# Patient Record
Sex: Male | Born: 1955 | Race: White | Hispanic: No | Marital: Single | Smoking: Former smoker
Health system: Southern US, Community
[De-identification: ages and names within clinical notes are randomized; demographics above are authoritative.]

## PROBLEM LIST (undated history)

## (undated) DIAGNOSIS — I209 Angina pectoris, unspecified: Secondary | ICD-10-CM

## (undated) DIAGNOSIS — E11621 Type 2 diabetes mellitus with foot ulcer: Secondary | ICD-10-CM

## (undated) DIAGNOSIS — I214 Non-ST elevation (NSTEMI) myocardial infarction: Secondary | ICD-10-CM

## (undated) DIAGNOSIS — G9340 Encephalopathy, unspecified: Secondary | ICD-10-CM

## (undated) DIAGNOSIS — F419 Anxiety disorder, unspecified: Secondary | ICD-10-CM

## (undated) DIAGNOSIS — I4891 Unspecified atrial fibrillation: Secondary | ICD-10-CM

## (undated) DIAGNOSIS — R51 Headache: Secondary | ICD-10-CM

## (undated) DIAGNOSIS — F329 Major depressive disorder, single episode, unspecified: Secondary | ICD-10-CM

## (undated) DIAGNOSIS — L97509 Non-pressure chronic ulcer of other part of unspecified foot with unspecified severity: Secondary | ICD-10-CM

## (undated) DIAGNOSIS — I4892 Unspecified atrial flutter: Secondary | ICD-10-CM

## (undated) DIAGNOSIS — I739 Peripheral vascular disease, unspecified: Secondary | ICD-10-CM

## (undated) DIAGNOSIS — I219 Acute myocardial infarction, unspecified: Secondary | ICD-10-CM

## (undated) DIAGNOSIS — I10 Essential (primary) hypertension: Secondary | ICD-10-CM

## (undated) DIAGNOSIS — F0390 Unspecified dementia without behavioral disturbance: Secondary | ICD-10-CM

## (undated) DIAGNOSIS — E669 Obesity, unspecified: Secondary | ICD-10-CM

## (undated) DIAGNOSIS — I5022 Chronic systolic (congestive) heart failure: Secondary | ICD-10-CM

## (undated) DIAGNOSIS — L0390 Cellulitis, unspecified: Secondary | ICD-10-CM

## (undated) DIAGNOSIS — M6282 Rhabdomyolysis: Secondary | ICD-10-CM

## (undated) DIAGNOSIS — S7290XA Unspecified fracture of unspecified femur, initial encounter for closed fracture: Secondary | ICD-10-CM

## (undated) DIAGNOSIS — I509 Heart failure, unspecified: Secondary | ICD-10-CM

## (undated) DIAGNOSIS — I25810 Atherosclerosis of coronary artery bypass graft(s) without angina pectoris: Secondary | ICD-10-CM

## (undated) DIAGNOSIS — J439 Emphysema, unspecified: Secondary | ICD-10-CM

## (undated) HISTORY — PX: CORONARY ANGIOPLASTY: SHX604

## (undated) HISTORY — DX: Unspecified fracture of unspecified femur, initial encounter for closed fracture: S72.90XA

## (undated) HISTORY — PX: CORONARY ARTERY BYPASS GRAFT: SHX141

## (undated) HISTORY — DX: Emphysema, unspecified: J43.9

## (undated) HISTORY — DX: Acute myocardial infarction, unspecified: I21.9

## (undated) HISTORY — PX: VASCULAR SURGERY: SHX849

## (undated) HISTORY — PX: NO PAST SURGERIES: SHX2092

## (undated) HISTORY — DX: Chronic systolic (congestive) heart failure: I50.22

## (undated) HISTORY — DX: Unspecified atrial flutter: I48.92

## (undated) HISTORY — DX: Obesity, unspecified: E66.9

## (undated) HISTORY — PX: OTHER SURGICAL HISTORY: SHX169

---

## 2003-09-23 ENCOUNTER — Other Ambulatory Visit: Payer: Self-pay

## 2005-06-04 ENCOUNTER — Emergency Department: Payer: Self-pay | Admitting: Emergency Medicine

## 2005-10-12 ENCOUNTER — Emergency Department: Payer: Self-pay | Admitting: Emergency Medicine

## 2005-10-22 ENCOUNTER — Emergency Department: Payer: Self-pay | Admitting: Emergency Medicine

## 2005-11-27 ENCOUNTER — Emergency Department: Payer: Self-pay | Admitting: Emergency Medicine

## 2005-11-28 ENCOUNTER — Ambulatory Visit: Payer: Self-pay | Admitting: Emergency Medicine

## 2005-12-29 ENCOUNTER — Emergency Department: Payer: Self-pay | Admitting: Emergency Medicine

## 2006-01-31 ENCOUNTER — Emergency Department: Payer: Self-pay | Admitting: Emergency Medicine

## 2006-02-23 ENCOUNTER — Emergency Department: Payer: Self-pay | Admitting: Unknown Physician Specialty

## 2006-04-17 ENCOUNTER — Inpatient Hospital Stay: Payer: Self-pay | Admitting: Internal Medicine

## 2006-05-12 ENCOUNTER — Inpatient Hospital Stay: Payer: Self-pay | Admitting: Internal Medicine

## 2006-08-19 ENCOUNTER — Emergency Department: Payer: Self-pay | Admitting: Emergency Medicine

## 2006-08-30 ENCOUNTER — Emergency Department: Payer: Self-pay | Admitting: Emergency Medicine

## 2006-09-13 HISTORY — PX: CARDIAC CATHETERIZATION: SHX172

## 2006-09-18 ENCOUNTER — Inpatient Hospital Stay: Payer: Self-pay | Admitting: Internal Medicine

## 2006-09-18 ENCOUNTER — Other Ambulatory Visit: Payer: Self-pay

## 2006-09-19 ENCOUNTER — Other Ambulatory Visit: Payer: Self-pay

## 2006-09-20 ENCOUNTER — Other Ambulatory Visit: Payer: Self-pay

## 2006-09-25 ENCOUNTER — Emergency Department: Payer: Self-pay | Admitting: Unknown Physician Specialty

## 2006-09-25 ENCOUNTER — Other Ambulatory Visit: Payer: Self-pay

## 2006-10-20 ENCOUNTER — Emergency Department: Payer: Self-pay | Admitting: Emergency Medicine

## 2006-12-24 ENCOUNTER — Emergency Department: Payer: Self-pay | Admitting: Emergency Medicine

## 2007-01-10 ENCOUNTER — Inpatient Hospital Stay: Payer: Self-pay | Admitting: Internal Medicine

## 2007-01-10 ENCOUNTER — Other Ambulatory Visit: Payer: Self-pay

## 2007-02-17 ENCOUNTER — Inpatient Hospital Stay: Payer: Self-pay | Admitting: Internal Medicine

## 2007-02-17 ENCOUNTER — Other Ambulatory Visit: Payer: Self-pay

## 2007-07-05 ENCOUNTER — Emergency Department: Payer: Self-pay | Admitting: Emergency Medicine

## 2007-09-23 ENCOUNTER — Other Ambulatory Visit: Payer: Self-pay

## 2007-09-24 ENCOUNTER — Observation Stay: Payer: Self-pay | Admitting: Internal Medicine

## 2007-12-21 ENCOUNTER — Other Ambulatory Visit: Payer: Self-pay

## 2007-12-21 ENCOUNTER — Observation Stay: Payer: Self-pay | Admitting: Internal Medicine

## 2009-02-20 ENCOUNTER — Emergency Department: Payer: Self-pay | Admitting: Emergency Medicine

## 2011-05-26 ENCOUNTER — Emergency Department (HOSPITAL_COMMUNITY)
Admission: EM | Admit: 2011-05-26 | Discharge: 2011-05-26 | Disposition: A | Discharge status: Home / Self Care | Payer: Self-pay | Attending: Emergency Medicine | Admitting: Emergency Medicine

## 2011-05-26 ENCOUNTER — Emergency Department (HOSPITAL_COMMUNITY): Payer: Self-pay

## 2011-05-26 DIAGNOSIS — I252 Old myocardial infarction: Secondary | ICD-10-CM | POA: Insufficient documentation

## 2011-05-26 DIAGNOSIS — Z79899 Other long term (current) drug therapy: Secondary | ICD-10-CM | POA: Insufficient documentation

## 2011-05-26 DIAGNOSIS — S0990XA Unspecified injury of head, initial encounter: Secondary | ICD-10-CM | POA: Insufficient documentation

## 2011-05-26 DIAGNOSIS — M542 Cervicalgia: Secondary | ICD-10-CM | POA: Insufficient documentation

## 2011-05-26 DIAGNOSIS — Y92009 Unspecified place in unspecified non-institutional (private) residence as the place of occurrence of the external cause: Secondary | ICD-10-CM | POA: Insufficient documentation

## 2011-05-26 DIAGNOSIS — S0003XA Contusion of scalp, initial encounter: Secondary | ICD-10-CM | POA: Insufficient documentation

## 2011-05-26 DIAGNOSIS — E119 Type 2 diabetes mellitus without complications: Secondary | ICD-10-CM | POA: Insufficient documentation

## 2011-05-26 DIAGNOSIS — W010XXA Fall on same level from slipping, tripping and stumbling without subsequent striking against object, initial encounter: Secondary | ICD-10-CM | POA: Insufficient documentation

## 2011-05-26 DIAGNOSIS — R51 Headache: Secondary | ICD-10-CM | POA: Insufficient documentation

## 2011-05-26 DIAGNOSIS — S1093XA Contusion of unspecified part of neck, initial encounter: Secondary | ICD-10-CM | POA: Insufficient documentation

## 2011-10-14 ENCOUNTER — Inpatient Hospital Stay (HOSPITAL_COMMUNITY)
Admission: EM | Admit: 2011-10-14 | Discharge: 2011-11-10 | DRG: 239 | Disposition: A | Discharge status: Skilled Nursing Facility | Payer: MEDICAID | Attending: Internal Medicine | Admitting: Internal Medicine

## 2011-10-14 ENCOUNTER — Emergency Department (HOSPITAL_COMMUNITY): Payer: Self-pay

## 2011-10-14 ENCOUNTER — Encounter: Payer: Self-pay | Admitting: *Deleted

## 2011-10-14 DIAGNOSIS — Z89519 Acquired absence of unspecified leg below knee: Secondary | ICD-10-CM

## 2011-10-14 DIAGNOSIS — Z951 Presence of aortocoronary bypass graft: Secondary | ICD-10-CM

## 2011-10-14 DIAGNOSIS — E1159 Type 2 diabetes mellitus with other circulatory complications: Principal | ICD-10-CM | POA: Diagnosis present

## 2011-10-14 DIAGNOSIS — I4892 Unspecified atrial flutter: Secondary | ICD-10-CM

## 2011-10-14 DIAGNOSIS — E119 Type 2 diabetes mellitus without complications: Secondary | ICD-10-CM | POA: Diagnosis present

## 2011-10-14 DIAGNOSIS — I96 Gangrene, not elsewhere classified: Secondary | ICD-10-CM | POA: Diagnosis present

## 2011-10-14 DIAGNOSIS — E11621 Type 2 diabetes mellitus with foot ulcer: Secondary | ICD-10-CM

## 2011-10-14 DIAGNOSIS — M359 Systemic involvement of connective tissue, unspecified: Secondary | ICD-10-CM | POA: Diagnosis present

## 2011-10-14 DIAGNOSIS — T368X5A Adverse effect of other systemic antibiotics, initial encounter: Secondary | ICD-10-CM | POA: Diagnosis not present

## 2011-10-14 DIAGNOSIS — E1149 Type 2 diabetes mellitus with other diabetic neurological complication: Secondary | ICD-10-CM | POA: Diagnosis present

## 2011-10-14 DIAGNOSIS — B952 Enterococcus as the cause of diseases classified elsewhere: Secondary | ICD-10-CM | POA: Diagnosis present

## 2011-10-14 DIAGNOSIS — Z794 Long term (current) use of insulin: Secondary | ICD-10-CM

## 2011-10-14 DIAGNOSIS — L03039 Cellulitis of unspecified toe: Secondary | ICD-10-CM | POA: Diagnosis present

## 2011-10-14 DIAGNOSIS — M9080 Osteopathy in diseases classified elsewhere, unspecified site: Secondary | ICD-10-CM | POA: Diagnosis present

## 2011-10-14 DIAGNOSIS — L089 Local infection of the skin and subcutaneous tissue, unspecified: Secondary | ICD-10-CM

## 2011-10-14 DIAGNOSIS — I2510 Atherosclerotic heart disease of native coronary artery without angina pectoris: Secondary | ICD-10-CM | POA: Diagnosis present

## 2011-10-14 DIAGNOSIS — B965 Pseudomonas (aeruginosa) (mallei) (pseudomallei) as the cause of diseases classified elsewhere: Secondary | ICD-10-CM | POA: Diagnosis present

## 2011-10-14 DIAGNOSIS — I10 Essential (primary) hypertension: Secondary | ICD-10-CM

## 2011-10-14 DIAGNOSIS — M869 Osteomyelitis, unspecified: Secondary | ICD-10-CM | POA: Diagnosis present

## 2011-10-14 DIAGNOSIS — D696 Thrombocytopenia, unspecified: Secondary | ICD-10-CM | POA: Diagnosis not present

## 2011-10-14 DIAGNOSIS — I214 Non-ST elevation (NSTEMI) myocardial infarction: Secondary | ICD-10-CM

## 2011-10-14 DIAGNOSIS — L02619 Cutaneous abscess of unspecified foot: Secondary | ICD-10-CM | POA: Diagnosis present

## 2011-10-14 DIAGNOSIS — I25810 Atherosclerosis of coronary artery bypass graft(s) without angina pectoris: Secondary | ICD-10-CM

## 2011-10-14 DIAGNOSIS — B961 Klebsiella pneumoniae [K. pneumoniae] as the cause of diseases classified elsewhere: Secondary | ICD-10-CM | POA: Diagnosis present

## 2011-10-14 DIAGNOSIS — L97509 Non-pressure chronic ulcer of other part of unspecified foot with unspecified severity: Secondary | ICD-10-CM

## 2011-10-14 DIAGNOSIS — L03119 Cellulitis of unspecified part of limb: Secondary | ICD-10-CM

## 2011-10-14 HISTORY — DX: Atherosclerosis of coronary artery bypass graft(s) without angina pectoris: I25.810

## 2011-10-14 HISTORY — DX: Essential (primary) hypertension: I10

## 2011-10-14 HISTORY — DX: Type 2 diabetes mellitus with foot ulcer: E11.621

## 2011-10-14 HISTORY — DX: Headache: R51

## 2011-10-14 HISTORY — DX: Type 2 diabetes mellitus with foot ulcer: L97.509

## 2011-10-14 HISTORY — DX: Non-ST elevation (NSTEMI) myocardial infarction: I21.4

## 2011-10-14 HISTORY — DX: Anxiety disorder, unspecified: F41.9

## 2011-10-14 HISTORY — DX: Non-pressure chronic ulcer of other part of unspecified foot with unspecified severity: L97.509

## 2011-10-14 LAB — POCT I-STAT, CHEM 8
Chloride: 94 mEq/L — ABNORMAL LOW (ref 96–112)
Creatinine, Ser: 1.5 mg/dL — ABNORMAL HIGH (ref 0.50–1.35)
Glucose, Bld: 511 mg/dL — ABNORMAL HIGH (ref 70–99)
Potassium: 5 mEq/L (ref 3.5–5.1)

## 2011-10-14 LAB — CBC
Hemoglobin: 14.4 g/dL (ref 13.0–17.0)
RBC: 5.1 MIL/uL (ref 4.22–5.81)

## 2011-10-14 LAB — DIFFERENTIAL
Lymphs Abs: 0.7 10*3/uL (ref 0.7–4.0)
Monocytes Relative: 12 % (ref 3–12)
Neutro Abs: 8.4 10*3/uL — ABNORMAL HIGH (ref 1.7–7.7)
Neutrophils Relative %: 81 % — ABNORMAL HIGH (ref 43–77)

## 2011-10-14 MED ORDER — HYDROMORPHONE HCL PF 1 MG/ML IJ SOLN
0.5000 mg | INTRAMUSCULAR | Status: DC | PRN
  Administered 2011-10-15 (×2): 0.5 mg via INTRAVENOUS
  Filled 2011-10-14 (×2): qty 1

## 2011-10-14 MED ORDER — SODIUM CHLORIDE 0.9 % IV SOLN
Freq: Once | INTRAVENOUS | Status: AC
  Administered 2011-10-15: via INTRAVENOUS

## 2011-10-14 MED ORDER — VANCOMYCIN HCL IN DEXTROSE 1-5 GM/200ML-% IV SOLN
1000.0000 mg | Freq: Once | INTRAVENOUS | Status: AC
  Administered 2011-10-15: 1000 mg via INTRAVENOUS
  Filled 2011-10-14: qty 200

## 2011-10-14 MED ORDER — ONDANSETRON HCL 4 MG/2ML IJ SOLN
4.0000 mg | Freq: Once | INTRAMUSCULAR | Status: AC
  Administered 2011-10-15: 4 mg via INTRAVENOUS
  Filled 2011-10-14: qty 2

## 2011-10-14 MED ORDER — INSULIN ASPART 100 UNIT/ML ~~LOC~~ SOLN
10.0000 [IU] | Freq: Once | SUBCUTANEOUS | Status: AC
  Administered 2011-10-15: 10 [IU] via SUBCUTANEOUS
  Filled 2011-10-14: qty 10

## 2011-10-14 NOTE — ED Notes (Signed)
PT reports he was seen at his PCP but unable to seen MD.Foot is now worse . Pt reports PO controlled DM

## 2011-10-14 NOTE — ED Provider Notes (Addendum)
History     CSN: [id number] Arrival date & time: 10/14/2011  7:58 PM   First MD Initiated Contact with Patient 10/14/11 2222      Chief Complaint  Patient presents with  . Foot Pain    Pt reports infection in rt foot. foot bright red with red blood blisters    (Consider location/radiation/quality/duration/timing/severity/associated sxs/prior treatment) HPI Comments:  [name] is a non-insulin-dependent diabetic.  His reports his blood sugars have been normal he also states, that he's had chronic ulcers at the base of the great toe bilaterally for "a while."  Recently her right foot has become swollen, red, painful, and the ulcer at the base of the toe draining.  He reports he has been unable to see his primary care physician.  Elevation helps the pain minimally, again, or having the leg dependent, increases the pain.  Denies fever, nausea, vomiting, diarrhea, myalgias  Patient is a 55 y.o. male presenting with lower extremity pain. The history is provided by the patient.  Foot Pain This is a chronic problem. The current episode started more than 1 month ago. The problem occurs constantly. The problem has been gradually worsening. Pertinent negatives include no chest pain, coughing, fever, myalgias, nausea, vomiting or weakness. The symptoms are aggravated by exertion. He has tried nothing for the symptoms. The treatment provided no relief.  Foot Pain This is a chronic problem. The current episode started more than 1 month ago. The problem occurs constantly. The problem has been gradually worsening. Pertinent negatives include no chest pain and no shortness of breath. The symptoms are aggravated by exertion. He has tried nothing for the symptoms. The treatment provided no relief.    Past Medical History  Diagnosis Date  . Diabetes mellitus     No past surgical history on file.  No family history on file.  History  Substance Use Topics  . Smoking status: Never Smoker   . Smokeless  tobacco: Not on file  . Alcohol Use: No      Review of Systems  Constitutional: Negative for fever and activity change.  HENT: Negative.   Eyes: Negative.   Respiratory: Negative for cough and shortness of breath.   Cardiovascular: Negative for chest pain.  Gastrointestinal: Negative for nausea, vomiting and diarrhea.  Genitourinary: Negative for dysuria and frequency.  Musculoskeletal: Negative for myalgias and back pain.  Skin: Positive for pallor.  Neurological: Negative for dizziness and weakness.  Hematological: Negative.   Psychiatric/Behavioral: Negative.     Allergies  Review of patient's allergies indicates no known allergies.  Home Medications   Current Outpatient Rx  Name Route Sig Dispense Refill  . ALPRAZOLAM 1 MG PO TABS Oral Take 1 mg by mouth 3 (three) times daily as needed. anxiety       BP 139/84  Pulse 58  Temp(Src) 98.7 F (37.1 C) (Oral)  Resp 16  SpO2 100%  Physical Exam  Constitutional: He is oriented to person, place, and time. He appears well-developed and well-nourished.  HENT:  Head: Normocephalic.  Eyes: EOM are normal.  Cardiovascular: Normal rate and regular rhythm.   Pulmonary/Chest: Effort normal.  Abdominal: Soft.  Musculoskeletal: Normal range of motion.       Feet:  Neurological: He is oriented to person, place, and time.  Psychiatric: He has a normal mood and affect.    ED Course  Procedures (including critical care time)  Labs Reviewed  CBC - Abnormal; Notable for the following:    Platelets 123 (*)  All other components within normal limits  DIFFERENTIAL - Abnormal; Notable for the following:    Neutrophils Relative 81 (*)    Neutro Abs 8.4 (*)    Lymphocytes Relative 6 (*)    Monocytes Absolute 1.3 (*)    All other components within normal limits  POCT I-STAT, CHEM 8 - Abnormal; Notable for the following:    Sodium 130 (*)    Chloride 94 (*)    BUN 28 (*)    Creatinine, Ser 1.50 (*)    Glucose, Bld 511 (*)     Calcium, Ion 1.11 (*)    All other components within normal limits  GLUCOSE, CAPILLARY - Abnormal; Notable for the following:    Glucose-Capillary 460 (*)    All other components within normal limits  WOUND CULTURE  I-STAT, CHEM 8  POCT CBG MONITORING   Dg Foot 2 Views Left  10/15/2011  *RADIOLOGY REPORT*  Clinical Data: Penetrating wounds to the ball of the left foot, with worsening erythema and blistering.  LEFT FOOT - 2 VIEW  Comparison: None.  Findings: There is no evidence of fracture or dislocation.  No osseous erosions are seen to suggest osteomyelitis.  The joint spaces are preserved.  There is no evidence of talar subluxation; the subtalar joint is unremarkable in appearance.  Soft tissue swelling and defect are noted along the plantar aspect of the forefoot.  IMPRESSION:  1.  No evidence of fracture or dislocation.  No definite evidence for osteomyelitis, though if there is significant clinical concern for osteomyelitis, MRI could be considered for further evaluation. 2.  Soft tissue defect and swelling along the plantar aspect of the forefoot.  Original Report Authenticated By: Tonia Ghent, M.D.   Dg Foot 2 Views Right  10/15/2011  *RADIOLOGY REPORT*  Clinical Data: Penetrating wounds to the ball of the right foot from blisters, with diffuse erythema and pain.  RIGHT FOOT - 2 VIEW  Comparison: None.  Findings: There is no evidence of fracture or dislocation.  No osseous erosions are seen to suggest osteomyelitis.  The joint spaces are preserved.  There is no evidence of talar subluxation; the subtalar joint is unremarkable in appearance.  Significant diffuse soft tissue air is noted tracking about the first toe and dorsal to the forefoot, concerning for necrotizing fasciitis.  Underlying soft tissue defects are noted along the plantar aspect of the forefoot.  IMPRESSION:  1.  No evidence of fracture or dislocation.  No definite evidence for osteomyelitis, though if there is significant  clinical concern for osteomyelitis, MRI could be considered for further evaluation. 2.  Significant diffuse soft tissue air about the first toe and dorsum of the forefoot, concerning for necrotizing fasciitis.  Findings were discussed with Earley Favor at 01:03 a.m. on 10/14/2011.  Original Report Authenticated By: Tonia Ghent, M.D.     1. Cellulitis and abscess of foot       MDM  Cellulitis with deep ulcer to base of fifth great toe sole side of foot with erythema on dorsum of foot, extending to ankle with red streaking to mid shin.  Will obtain CBC, electrolytes, x-ray of the foot.  Start IV antibiotics, culture the wound and have this patient admitted   Medical screening examination/treatment/procedure(s) were conducted as a shared visit with non-physician practitioner(s) and myself.  I personally evaluated the patient during the encounter     Arman Filter, NP 10/15/11 1610  Doug Sou, MD 10/15/11 9604  Doug Sou, MD 10/15/11 [phone]

## 2011-10-15 ENCOUNTER — Encounter (HOSPITAL_COMMUNITY): Payer: Self-pay | Admitting: Internal Medicine

## 2011-10-15 ENCOUNTER — Encounter (HOSPITAL_COMMUNITY)
Admission: EM | Disposition: A | Discharge status: Skilled Nursing Facility | Payer: Self-pay | Source: Home / Self Care | Attending: Internal Medicine

## 2011-10-15 ENCOUNTER — Inpatient Hospital Stay (HOSPITAL_COMMUNITY): Payer: Self-pay

## 2011-10-15 ENCOUNTER — Other Ambulatory Visit: Payer: Self-pay

## 2011-10-15 DIAGNOSIS — L089 Local infection of the skin and subcutaneous tissue, unspecified: Secondary | ICD-10-CM | POA: Diagnosis present

## 2011-10-15 DIAGNOSIS — E119 Type 2 diabetes mellitus without complications: Secondary | ICD-10-CM | POA: Diagnosis present

## 2011-10-15 DIAGNOSIS — M79609 Pain in unspecified limb: Secondary | ICD-10-CM

## 2011-10-15 DIAGNOSIS — I25810 Atherosclerosis of coronary artery bypass graft(s) without angina pectoris: Secondary | ICD-10-CM | POA: Diagnosis present

## 2011-10-15 DIAGNOSIS — I4892 Unspecified atrial flutter: Secondary | ICD-10-CM

## 2011-10-15 DIAGNOSIS — I10 Essential (primary) hypertension: Secondary | ICD-10-CM | POA: Diagnosis present

## 2011-10-15 HISTORY — DX: Unspecified atrial flutter: I48.92

## 2011-10-15 LAB — LIPID PANEL
Cholesterol: 150 mg/dL (ref 0–200)
HDL: 13 mg/dL — ABNORMAL LOW (ref >39)
Total CHOL/HDL Ratio: 11.5 RATIO
VLDL: 37 mg/dL (ref 0–40)

## 2011-10-15 LAB — COMPREHENSIVE METABOLIC PANEL
ALT: 18 U/L (ref 0–53)
Albumin: 2.2 g/dL — ABNORMAL LOW (ref 3.5–5.2)
Calcium: 8.5 mg/dL (ref 8.4–10.5)
GFR calc Af Amer: >90 mL/min (ref >90)
Glucose, Bld: 358 mg/dL — ABNORMAL HIGH (ref 70–99)
Sodium: 130 mEq/L — ABNORMAL LOW (ref 135–145)
Total Protein: 6.7 g/dL (ref 6.0–8.3)

## 2011-10-15 LAB — GLUCOSE, CAPILLARY
Glucose-Capillary: 343 mg/dL — ABNORMAL HIGH (ref 70–99)
Glucose-Capillary: 482 mg/dL — ABNORMAL HIGH (ref 70–99)

## 2011-10-15 LAB — CARDIAC PANEL(CRET KIN+CKTOT+MB+TROPI)
CK, MB: 8 ng/mL (ref 0.3–4.0)
CK, MB: 6.2 ng/mL (ref 0.3–4.0)
CK, MB: 5.2 ng/mL — ABNORMAL HIGH (ref 0.3–4.0)
Relative Index: 5.4 — ABNORMAL HIGH (ref 0.0–2.5)
Total CK: 115 U/L (ref 7–232)
Total CK: 109 U/L (ref 7–232)

## 2011-10-15 LAB — D-DIMER, QUANTITATIVE: D-Dimer, Quant: 2.33 ug/mL-FEU — ABNORMAL HIGH (ref 0.00–0.48)

## 2011-10-15 LAB — HEMOGLOBIN A1C: Mean Plasma Glucose: 332 mg/dL — ABNORMAL HIGH (ref <117)

## 2011-10-15 SURGERY — IRRIGATION AND DEBRIDEMENT EXTREMITY
Anesthesia: General | Laterality: Right

## 2011-10-15 MED ORDER — INSULIN ASPART 100 UNIT/ML ~~LOC~~ SOLN
6.0000 [IU] | Freq: Once | SUBCUTANEOUS | Status: AC
  Administered 2011-10-16: 6 [IU] via SUBCUTANEOUS

## 2011-10-15 MED ORDER — OXYCODONE HCL 5 MG PO TABS
5.0000 mg | ORAL_TABLET | ORAL | Status: DC | PRN
  Administered 2011-10-15 – 2011-11-01 (×35): 5 mg via ORAL
  Filled 2011-10-15 (×37): qty 1

## 2011-10-15 MED ORDER — POLYETHYLENE GLYCOL 3350 17 G PO PACK
17.0000 g | PACK | Freq: Every day | ORAL | Status: DC | PRN
  Filled 2011-10-15: qty 1

## 2011-10-15 MED ORDER — IOHEXOL 300 MG/ML  SOLN
100.0000 mL | Freq: Once | INTRAMUSCULAR | Status: AC | PRN
  Administered 2011-10-15: 100 mL via INTRAVENOUS

## 2011-10-15 MED ORDER — HEPARIN BOLUS VIA INFUSION
4000.0000 [IU] | Freq: Once | INTRAVENOUS | Status: AC
  Administered 2011-10-15: 4000 [IU] via INTRAVENOUS
  Filled 2011-10-15: qty 4000

## 2011-10-15 MED ORDER — HEPARIN SOD (PORCINE) IN D5W 100 UNIT/ML IV SOLN
1450.0000 [IU]/h | INTRAVENOUS | Status: DC
  Administered 2011-10-15: 1200 [IU]/h via INTRAVENOUS
  Administered 2011-10-15 – 2011-10-16 (×3): 1450 [IU]/h via INTRAVENOUS
  Filled 2011-10-15 (×5): qty 250

## 2011-10-15 MED ORDER — GADOBENATE DIMEGLUMINE 529 MG/ML IV SOLN
20.0000 mL | Freq: Once | INTRAVENOUS | Status: AC | PRN
  Administered 2011-10-15: 20 mL via INTRAVENOUS

## 2011-10-15 MED ORDER — SODIUM CHLORIDE 0.9 % IV SOLN
INTRAVENOUS | Status: DC
  Filled 2011-10-15: qty 1

## 2011-10-15 MED ORDER — SENNA 8.6 MG PO TABS
1.0000 | ORAL_TABLET | Freq: Two times a day (BID) | ORAL | Status: DC
  Administered 2011-10-16 – 2011-10-31 (×24): 8.6 mg via ORAL
  Filled 2011-10-15 (×36): qty 1

## 2011-10-15 MED ORDER — VANCOMYCIN HCL IN DEXTROSE 1-5 GM/200ML-% IV SOLN
1000.0000 mg | Freq: Two times a day (BID) | INTRAVENOUS | Status: DC
  Administered 2011-10-15 – 2011-10-23 (×17): 1000 mg via INTRAVENOUS
  Filled 2011-10-15 (×18): qty 200

## 2011-10-15 MED ORDER — HYDROMORPHONE HCL PF 1 MG/ML IJ SOLN
1.0000 mg | INTRAMUSCULAR | Status: DC | PRN
  Administered 2011-10-18 – 2011-10-25 (×2): 1 mg via INTRAVENOUS
  Filled 2011-10-15 (×2): qty 1

## 2011-10-15 MED ORDER — SODIUM CHLORIDE 0.9 % IV SOLN
INTRAVENOUS | Status: DC
  Administered 2011-10-15: 1000 mL via INTRAVENOUS

## 2011-10-15 MED ORDER — ASPIRIN 325 MG PO TABS
325.0000 mg | ORAL_TABLET | Freq: Every day | ORAL | Status: DC
  Administered 2011-10-16 – 2011-10-26 (×11): 325 mg via ORAL
  Filled 2011-10-15 (×11): qty 1

## 2011-10-15 MED ORDER — PIPERACILLIN-TAZOBACTAM 3.375 G IVPB
3.3750 g | Freq: Three times a day (TID) | INTRAVENOUS | Status: DC
  Administered 2011-10-15 – 2011-10-25 (×31): 3.375 g via INTRAVENOUS
  Filled 2011-10-15 (×34): qty 50

## 2011-10-15 MED ORDER — ROSUVASTATIN CALCIUM 20 MG PO TABS
20.0000 mg | ORAL_TABLET | Freq: Every day | ORAL | Status: DC
  Administered 2011-10-15 – 2011-10-31 (×16): 20 mg via ORAL
  Filled 2011-10-15 (×18): qty 1

## 2011-10-15 MED ORDER — HEPARIN BOLUS VIA INFUSION
2500.0000 [IU] | Freq: Once | INTRAVENOUS | Status: AC
  Administered 2011-10-15: 2500 [IU] via INTRAVENOUS
  Filled 2011-10-15: qty 2500

## 2011-10-15 MED ORDER — ONDANSETRON HCL 4 MG PO TABS: 4.0000 mg | ORAL_TABLET | Freq: Four times a day (QID) | ORAL | Status: DC | PRN

## 2011-10-15 MED ORDER — DOCUSATE SODIUM 100 MG PO CAPS
100.0000 mg | ORAL_CAPSULE | Freq: Two times a day (BID) | ORAL | Status: DC
  Administered 2011-10-16 – 2011-10-31 (×22): 100 mg via ORAL
  Filled 2011-10-15 (×36): qty 1

## 2011-10-15 MED ORDER — DILTIAZEM HCL 100 MG IV SOLR
5.0000 mg/h | INTRAVENOUS | Status: DC
  Administered 2011-10-15: 10 mg/h via INTRAVENOUS
  Administered 2011-10-15: 5 mg/h via INTRAVENOUS
  Administered 2011-10-16: 10 mg/h via INTRAVENOUS
  Filled 2011-10-15 (×4): qty 100

## 2011-10-15 MED ORDER — HEPARIN SODIUM (PORCINE) 5000 UNIT/ML IJ SOLN
5000.0000 [IU] | Freq: Three times a day (TID) | INTRAMUSCULAR | Status: DC
  Filled 2011-10-15 (×3): qty 1

## 2011-10-15 MED ORDER — ONDANSETRON HCL 4 MG/2ML IJ SOLN: 4.0000 mg | Freq: Four times a day (QID) | INTRAMUSCULAR | Status: DC | PRN

## 2011-10-15 MED ORDER — INSULIN ASPART 100 UNIT/ML ~~LOC~~ SOLN
0.0000 [IU] | Freq: Three times a day (TID) | SUBCUTANEOUS | Status: DC
  Administered 2011-10-15: 8 [IU] via SUBCUTANEOUS
  Administered 2011-10-15: 11 [IU] via SUBCUTANEOUS
  Administered 2011-10-16: 15 [IU] via SUBCUTANEOUS
  Filled 2011-10-15: qty 3

## 2011-10-15 MED ORDER — ACETAMINOPHEN 325 MG PO TABS
650.0000 mg | ORAL_TABLET | Freq: Four times a day (QID) | ORAL | Status: DC | PRN
  Administered 2011-10-20 – 2011-10-26 (×6): 650 mg via ORAL
  Filled 2011-10-15 (×6): qty 2

## 2011-10-15 MED ORDER — METOPROLOL TARTRATE 1 MG/ML IV SOLN: 5.0000 mg | Freq: Once | INTRAVENOUS | Status: DC

## 2011-10-15 MED ORDER — SODIUM CHLORIDE 0.9 % IV SOLN: INTRAVENOUS | Status: DC

## 2011-10-15 MED ORDER — ASPIRIN 325 MG PO TABS
325.0000 mg | ORAL_TABLET | Freq: Every day | ORAL | Status: DC
  Filled 2011-10-15: qty 1

## 2011-10-15 MED ORDER — ACETAMINOPHEN 650 MG RE SUPP: 650.0000 mg | Freq: Four times a day (QID) | RECTAL | Status: DC | PRN

## 2011-10-15 MED ORDER — SODIUM CHLORIDE 0.9 % IV SOLN
INTRAVENOUS | Status: DC
  Administered 2011-10-15 – 2011-10-23 (×10): via INTRAVENOUS

## 2011-10-15 MED ORDER — METOPROLOL TARTRATE 12.5 MG HALF TABLET
12.5000 mg | ORAL_TABLET | Freq: Two times a day (BID) | ORAL | Status: DC
  Administered 2011-10-15 – 2011-10-17 (×5): 12.5 mg via ORAL
  Filled 2011-10-15 (×8): qty 1

## 2011-10-15 MED ORDER — DILTIAZEM HCL 25 MG/5ML IV SOLN
10.0000 mg | Freq: Once | INTRAVENOUS | Status: AC
  Administered 2011-10-15: 10 mg via INTRAVENOUS
  Filled 2011-10-15: qty 5

## 2011-10-15 NOTE — Progress Notes (Signed)
ANTIBIOTIC CONSULT NOTE - INITIAL  Pharmacy Consult for Vancomycin and Zosyn  Indication: Cellulitis  No Known Allergies  Patient Measurements: Height: 5\' 7"  (170.2 cm) Weight: 173 lb 1 oz (78.5 kg) IBW/kg (Calculated) : 66.1    Vital Signs: Temp: 98.6 F (37 C) (12/02 0642) Temp src: Oral (12/02 0642) BP: 132/84 mmHg (12/02 0642) Pulse Rate: 100  (12/02 0642)  Labs:  Basename 10/14/11 2254 10/14/11 2233  WBC -- 10.4  HGB 15.3 14.4  PLT -- 123*  LABCREA -- --  CREATININE 1.50* --   Estimated Creatinine Clearance: 50.8 ml/min (by C-G formula based on Cr of 1.5). No results found for this basename: VANCOTROUGH:2,VANCOPEAK:2,VANCORANDOM:2,GENTTROUGH:2,GENTPEAK:2,GENTRANDOM:2,TOBRATROUGH:2,TOBRAPEAK:2,TOBRARND:2,AMIKACINPEAK:2,AMIKACINTROU:2,AMIKACIN:2, in the last 72 hours   Microbiology: No results found for this or any previous visit (from the past 720 hour(s)).  Medical History: Past Medical History  Diagnosis Date  . CAD (coronary artery disease), autologous vein bypass graft     s/p CABG in 80's or 90's, then cath x2, last in the 90's with DES and BMS placed but not sure where. Dr. Lupita Shutter at [facility]   . HTN (hypertension)   . 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 Diabetes mellitus   . Anxiety   . Headache     Medications:  Prescriptions prior to admission  Medication Sig Dispense Refill  . ALPRAZolam (XANAX) 1 MG tablet Take 1 mg by mouth. anxiety      . metFORMIN (GLUCOPHAGE) 500 MG tablet Take 500 mg by mouth 2 (two) times daily with a meal.         Assessment: 55 yo male with diabetic foot wound/cellulitis for empiric antibiotics  Vancomycin 1 g IV given in ED at midnight  Goal of Therapy:  Vancomycin trough level 10-15 mcg/ml  Plan:  Zosyn 3.375 g IV q8h Vancomycin 1 g IV q12h   Eddie Candle 10/15/2011,7:10 AM

## 2011-10-15 NOTE — Progress Notes (Signed)
Pt HR 121 after 40 minutes on 62ml/h of cardizem. Titrated to 61ml/h. Pt HR is now 104.

## 2011-10-15 NOTE — Progress Notes (Signed)
ANTICOAGULATION CONSULT NOTE - Initial Consult  Pharmacy Consult for Heparin Indication: A. Flutter, elevated Troponins  No Known Allergies  Patient Measurements: Height: 5\' 7"  (170.2 cm) Weight: 173 lb 1 oz (78.5 kg) IBW/kg (Calculated) : 66.1  Adjusted Body Weight: 78.5 kg  Vital Signs: Temp: 98.6 F (37 C) (12/02 0642) Temp src: Oral (12/02 0642) BP: 132/84 mmHg (12/02 0642) Pulse Rate: 100  (12/02 0642)  Labs:  Basename 10/15/11 0911 10/14/11 2254 10/14/11 2233  HGB -- 15.3 14.4  HCT -- 45.0 41.0  PLT -- -- 123*  APTT -- -- --  LABPROT -- -- --  INR -- -- --  HEPARINUNFRC -- -- --  CREATININE -- 1.50* --  CKTOTAL 157 -- --  CKMB 8.0* -- --  TROPONINI 2.98* -- --   Estimated Creatinine Clearance: 50.8 ml/min (by C-G formula based on Cr of 1.5).  Medical History: Past Medical History  Diagnosis Date  . CAD (coronary artery disease), autologous vein bypass graft     s/p CABG in 80's or 90's, then cath x2, last in the 90's with DES and BMS placed but not sure where. Dr[facility] at [facility]   . HTN (hypertension)   . 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 Diabetes mellitus   . Anxiety   . Headache     Medications:  Scheduled:    . sodium chloride   Intravenous Once  . aspirin  325 mg Oral Daily  . diltiazem  10 mg Intravenous Once  . docusate sodium  100 mg Oral BID  . insulin aspart  0-15 Units Subcutaneous TID WC  . insulin aspart  10 Units Subcutaneous Once  . metoprolol  5 mg Intravenous Once  . metoprolol tartrate  12.5 mg Oral BID  . ondansetron  4 mg Intravenous Once  . piperacillin-tazobactam (ZOSYN)  IV  3.375 g Intravenous Q8H  . rosuvastatin  20 mg Oral q1800  . senna  1 tablet Oral BID  . vancomycin  1,000 mg Intravenous Once  . vancomycin  1,000 mg Intravenous Q12H  . DISCONTD: aspirin  325 mg Oral Daily  . DISCONTD: heparin  5,000 Units Subcutaneous Q8H  .  DISCONTD: insulin (NOVOLIN-R) infusion   Intravenous To Minor    Assessment: 55yo male admitted with Diabetic Foot wound for antibiotic treatment, now in A.Flutter- to start Heparin.  Pt with h/o CAD & CABG, no h/o bleeding problems and is not currently on Lovenox or Heparin SQ.  Goal of Therapy:  Heparin level 0.3-0.7 units/ml   Plan:  1.  Heparin 4000 units IV x 1, then 1200 units/hr 2.  Heparin level 6hr 3.  Daily Heparin level & CBC  Bernisha Verma P 10/15/2011,10:51 AM

## 2011-10-15 NOTE — Progress Notes (Signed)
Subjective: Chest discomfort and some foot pain.   Objective: Weight change:  No intake or output data in the 24 hours ending 10/15/11 1052 Pt is alert afebrile comfortable. CVS , S1 andS2 heard. Lungs CTAB Abdomen: soft , bowel sounds heard, non tender EXtremities: LLE has prominent medial SVG harvest scar surrounded by  brown hyperpigmented venous stasis type changes. It is cool but not cold or cyanotic.  Under the 1st MTP is a 2-3 cm ulcer that is not draining, is dry, does not appear  infected. The RLE is grossly abnormal with soft pitting edema up to 1/3 of shin, bright  red erythema diffuse from this area going down dorsum of foot, and large blisters on  dorsum of foot filled with dark purple fluid, and wrapping around medially to another  1st MTP plantar 2-3 cm ulcer   Lab Results:  [facility] 10/14/11 2254  NA 130*  K 5.0  CL 94*  CO2 --  GLUCOSE 511*  BUN 28*  CREATININE 1.50*  CALCIUM --  MG --  PHOS --   No results found for this basename: AST:2,ALT:2,ALKPHOS:2,BILITOT:2,PROT:2,ALBUMIN:2 in the last 72 hours No results found for this basename: LIPASE:2,AMYLASE:2 in the last 72 hours  Basename 10/14/11 2254 10/14/11 2233  WBC -- 10.4  NEUTROABS -- 8.4*  HGB 15.3 14.4  HCT 45.0 41.0  MCV -- 80.4  PLT -- 123*    Basename 10/15/11 0911  CKTOTAL 157  CKMB 8.0*  CKMBINDEX --  TROPONINI 2.98*   No results found for this basename: POCBNP:3 in the last 72 hours No results found for this basename: DDIMER:2 in the last 72 hours No results found for this basename: HGBA1C:2 in the last 72 hours  Basename 10/15/11 0912  CHOL 150  HDL 13*  LDLCALC 100*  TRIG 183*  CHOLHDL 11.5  LDLDIRECT --   No results found for this basename: TSH,T4TOTAL,FREET3,T3FREE,THYROIDAB in the last 72 hours No results found for this basename: VITAMINB12:2,FOLATE:2,FERRITIN:2,TIBC:2,IRON:2,RETICCTPCT:2 in the last 72 hours  Studies/Results: Dg Foot 2 Views Left  10/15/2011   *RADIOLOGY REPORT*  Clinical Data: Penetrating wounds to the ball of the left foot, with worsening erythema and blistering.  LEFT FOOT - 2 VIEW  Comparison: None.  Findings: There is no evidence of fracture or dislocation.  No osseous erosions are seen to suggest osteomyelitis.  The joint spaces are preserved.  There is no evidence of talar subluxation; the subtalar joint is unremarkable in appearance.  Soft tissue swelling and defect are noted along the plantar aspect of the forefoot.  IMPRESSION:  1.  No evidence of fracture or dislocation.  No definite evidence for osteomyelitis, though if there is significant clinical concern for osteomyelitis, MRI could be considered for further evaluation. 2.  Soft tissue defect and swelling along the plantar aspect of the forefoot.  Original Report Authenticated By: Tonia Ghent, M.D.   Dg Foot 2 Views Right  10/15/2011  *RADIOLOGY REPORT*  Clinical Data: Penetrating wounds to the ball of the right foot from blisters, with diffuse erythema and pain.  RIGHT FOOT - 2 VIEW  Comparison: None.  Findings: There is no evidence of fracture or dislocation.  No osseous erosions are seen to suggest osteomyelitis.  The joint spaces are preserved.  There is no evidence of talar subluxation; the subtalar joint is unremarkable in appearance.  Significant diffuse soft tissue air is noted tracking about the first toe and dorsal to the forefoot, concerning for necrotizing fasciitis.  Underlying soft tissue defects are noted  along the plantar aspect of the forefoot.  IMPRESSION:  1.  No evidence of fracture or dislocation.  No definite evidence for osteomyelitis, though if there is significant clinical concern for osteomyelitis, MRI could be considered for further evaluation. 2.  Significant diffuse soft tissue air about the first toe and dorsum of the forefoot, concerning for necrotizing fasciitis.  Findings were discussed with Earley Favor at 01:03 a.m. on 10/14/2011.  Original Report  Authenticated By: Tonia Ghent, M.D.   Medications: Scheduled Meds:   . sodium chloride   Intravenous Once  . aspirin  325 mg Oral Daily  . diltiazem  10 mg Intravenous Once  . docusate sodium  100 mg Oral BID  . insulin aspart  0-15 Units Subcutaneous TID WC  . insulin aspart  10 Units Subcutaneous Once  . metoprolol  5 mg Intravenous Once  . metoprolol tartrate  12.5 mg Oral BID  . ondansetron  4 mg Intravenous Once  . piperacillin-tazobactam (ZOSYN)  IV  3.375 g Intravenous Q8H  . rosuvastatin  20 mg Oral q1800  . senna  1 tablet Oral BID  . vancomycin  1,000 mg Intravenous Once  . vancomycin  1,000 mg Intravenous Q12H  . DISCONTD: aspirin  325 mg Oral Daily  . DISCONTD: heparin  5,000 Units Subcutaneous Q8H  . DISCONTD: insulin (NOVOLIN-R) infusion   Intravenous To Minor   Continuous Infusions:   . sodium chloride 100 mL/hr at 10/15/11 0934  . diltiazem (CARDIZEM) infusion    . DISCONTD: sodium chloride 1,000 mL (10/15/11 0449)  . DISCONTD: insulin (NOVOLIN-R) infusion     PRN Meds:.acetaminophen, acetaminophen, gadobenate dimeglumine, HYDROmorphone, ondansetron (ZOFRAN) IV, ondansetron, oxyCODONE, polyethylene glycol, DISCONTD:  HYDROmorphone (DILAUDID) injection  Assessment/Plan: Patient Active Hospital Problem List: Soft tissue infection of foot (10/15/2011)/ Bilateral plantar ulcers with cellulitis. 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 Currently on broad-spectrum IV antibiotics he will most likely need count first MTP and second toes amputation in the very near future. Also on board and aWARE  there MRI results.  CAD (coronary artery disease), autologous vein bypass graft ()  Chin has new onset atrial flutter with rate in 150s, elevated troponins most likely demand ischemia in the setting of infection. He was started on IV heparin and IV Cardizem for rate control. Serial troponins, 2-D echocardiogram ordered. Cardiology consult obtained.  Started the patient on low-dose beta blockers. We'll get a repeat EKG in a.m.  HTN (hypertension) ()  control   Diabetes mellitus ()   sliding scale insulin. Discussed the above plan of care with the patient.   LOS: 1 day   Latham Kinzler 10/15/2011, 10:52 AM

## 2011-10-15 NOTE — ED Notes (Signed)
Pt report given and care endorsed to [place], Charity fundraiser.

## 2011-10-15 NOTE — Progress Notes (Signed)
Lower extremity venous duplex dopplers completed. No obvious evidence of DVT, superficial thrombus, or Baker's Cyst noted bilaterally.   [name] 10/15/2011, 2:54 PM

## 2011-10-15 NOTE — ED Provider Notes (Signed)
Complains of right foot pain for one week. Progressively worsening. No other complaint on exam patient alert nontoxic vital signs within normal limits right lower extremity foot with a blood blister over the dorsal aspect approximately 10 cm x 3 cm dorsal of the foot is diffusely reddened with a red streak up the distal third of the shin no inguinal nodes DP pulses 2+  Doug Sou, MD 10/15/11 [phone]

## 2011-10-15 NOTE — H&P (Signed)
PCP:  [name] in [place]   Chief Complaint:  Foot pain and ulcer   HPI:  55yoM with h/o 3v CABG then stents x2 (BMS and DES, unclear where), diabetes, and HTN  presents with bilateral plantar 1st MTP DM foot ulcers and right foot soft tissue  infection likely due to DM ulcer. Also found to by hyperglyemic, elevated Cr, and in Aflutter with RVR.  Pt states he gets most of his care at [facility]. For the past couple months he  has had plantar ulcers under his 1st MTP's, but has gone about his daily business  including walking his dog and caring for his father at home who has Alzheimer's. For the  past week, he's had more significant right foot pain, and over the past couple days the  dorsum of the right foot started blistering. He's had subjective chills but no  documented or subjective fevers. He's been sleeping more but doesn't endorse feeling  really ill. He went to see his PCP 3 days ago but was told they were too backed up and  busy to see him, so eventually he called his neice and her mother (ex sister in law) to  help him go to the hospital.   In the ED, vitals were stable. Chemistry panel showed Na 130, Cl 94, renal 28/1.5, and  glucose 511. WBC 10.4, plts 123, Hct normal. Wound culture pending. Left foot plain film  showed no evidence of osteo but there was soft tissue defect and swelling along plantar  aspect of forefoot. Right foot plain film showed no evidence of osteo, but significant  diffuse soft tissue air about the first toe and dorsum of forefoot, concerning for  necrotizing fasciitis. Ortho has seen the patient.   Pt has been given 10u IV insulin, IVF's, Diluadid, Zofran, and 1g Vanco. Currently  resting well.   ROS as above, o/w negative for chest pain, SOB, GI symptoms, and o/w extensively  negative. 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    Past Medical History  Diagnosis Date  . CAD (coronary artery disease), autologous vein bypass graft     s/p CABG in 80's or 90's, then cath x2, last in the 90's with DES and BMS placed but not sure where. Dr. Lupita Shutter at [facility]   . HTN (hypertension)   . 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 Diabetes mellitus     Past Surgical History  Procedure Date  . Coronary artery bypass graft    Medications:  HOME MEDS:  Reconciled by me with patient  Prior to Admission medications   Medication Sig Start Date End Date Taking? Authorizing Provider  ALPRAZolam Prudy Feeler) 1 MG tablet Take 1 mg by mouth. anxiety   Yes Historical Provider, MD  metFORMIN (GLUCOPHAGE) 500 MG tablet Take 500 mg by mouth 2 (two) times daily with a meal.     Yes Historical Provider, MD    Allergies:  No Known Allergies  Social History:    reports that he has quit smoking. His smoking use included Cigarettes. He has a 17.5 pack-year smoking history. He does not have any smokeless tobacco history on file. He reports that he does not drink alcohol or use illicit drugs.  Difficult home situation because he cares for  his dad with Alzheimer's, younger brother lives at home as well but doesn't get along with the dad at all and patient is in between the two, creating significant stress. Pt is concerned about who will care for the dad while he is in the hospital, is requesting help. He is single and has no children and only source of help is his niece.   Family History: Family History  Problem Relation Age of Onset  . Alzheimer's disease Father   . Melanoma Father   . Benign prostatic hyperplasia Father   . Coronary artery disease Father     s/p CABG  . Heart attack Brother   . Coronary artery disease Brother     s/p CABG  . Lymphoma Sister     non Hodgkin's   . Aortic aneurysm Mother   . Anemia Mother     Physical Exam: Filed  Vitals:   10/14/11 1958 10/15/11 0122 10/15/11 0200 10/15/11 0300  BP: 133/78 139/84 140/71 104/79  Pulse: 60 58 62 150  Temp: 98.4 F (36.9 C) 98.7 F (37.1 C)    TempSrc: Oral Oral    Resp: 18 16    SpO2: 98% 100% 98% 99%   Blood pressure 104/79, pulse 150, temperature 98.7 F (37.1 C), temperature source Oral, resp. rate 16, SpO2 99.00%.  Gen: Middle aged M who appears in fair health, currently appears non-toxic, non-ill, is  quite a nice guy and quite appreciative. Able to relate his history well.  HEENT: PERRL, EOMI, sclera, irises, and conjunctivae are normal appearing. Mouth,  tongue, and lips appear very dry appearing with lip cracking.  Neck: Supple, normal, no thyromegaly noted Lungs: CTAB no w/c/r, normal exam Heart: Tachy to 150, but without murmurs or gallops.  Abd: Soft, NT ND, very benign feeling Extrem: BUE's normal appearing. LLE has prominent medial SVG harvest scar surrounded by  brown hyperpigmented venous stasis type changes. It is cool but not cold or cyanotic.  Under the 1st MTP is a 2-3 cm ulcer that is not draining, is dry, does not appear  infected. The RLE is grossly abnormal with soft pitting edema up to 1/3 of shin, bright  red erythema diffuse from this area going down dorsum of foot, and large blisters on  dorsum of foot filled with dark purple fluid, and wrapping around medially to another  1st MTP plantar 2-3 cm ulcer. Unable to express pus. No black necrotic area though. The  whole area is warm. Sensation is intact.  Neuro: Alert, pleasant, no slurred speech, no facial droop, moves extremities well,  grossly non-focal   Labs & Imaging Results for orders placed during the hospital encounter of 10/14/11 (from the past 48 hour(s))  CBC     Status: Abnormal   Collection Time   10/14/11 10:33 PM      Component Value Range Comment   WBC 10.4  4.0 - 10.5 (K/uL)    RBC 5.10  4.22 - 5.81 (MIL/uL)    Hemoglobin 14.4  13.0 - 17.0 (g/dL)    HCT 16.1   09.6 - 04.5 (%)    MCV 80.4  78.0 - 100.0 (fL)    MCH 28.2  26.0 - 34.0 (pg)    MCHC 35.1  30.0 - 36.0 (g/dL)    RDW 40.9  81.1 - 91.4 (%)    Platelets 123 (*) 150 - 400 (K/uL)   DIFFERENTIAL     Status: Abnormal   Collection Time   10/14/11 10:33 PM  Component Value Range Comment   Neutrophils Relative 81 (*) 43 - 77 (%)    Neutro Abs 8.4 (*) 1.7 - 7.7 (K/uL)    Lymphocytes Relative 6 (*) 12 - 46 (%)    Lymphs Abs 0.7  0.7 - 4.0 (K/uL)    Monocytes Relative 12  3 - 12 (%)    Monocytes Absolute 1.3 (*) 0.1 - 1.0 (K/uL)    Eosinophils Relative 0  0 - 5 (%)    Eosinophils Absolute 0.0  0.0 - 0.7 (K/uL)    Basophils Relative 0  0 - 1 (%)    Basophils Absolute 0.0  0.0 - 0.1 (K/uL)   POCT I-STAT, CHEM 8     Status: Abnormal   Collection Time   10/14/11 10:54 PM      Component Value Range Comment   Sodium 130 (*) 135 - 145 (mEq/L)    Potassium 5.0  3.5 - 5.1 (mEq/L)    Chloride 94 (*) 96 - 112 (mEq/L)    BUN 28 (*) 6 - 23 (mg/dL)    Creatinine, Ser 9.14 (*) 0.50 - 1.35 (mg/dL)    Glucose, Bld 782 (*) 70 - 99 (mg/dL)    Calcium, Ion 9.56 (*) 1.12 - 1.32 (mmol/L)    TCO2 23  0 - 100 (mmol/L)    Hemoglobin 15.3  13.0 - 17.0 (g/dL)    HCT 21.3  08.6 - 57.8 (%)   GLUCOSE, CAPILLARY     Status: Abnormal   Collection Time   10/15/11  2:11 AM      Component Value Range Comment   Glucose-Capillary 460 (*) 70 - 99 (mg/dL)   GLUCOSE, CAPILLARY     Status: Abnormal   Collection Time   10/15/11  3:06 AM      Component Value Range Comment   Glucose-Capillary 415 (*) 70 - 99 (mg/dL)    Dg Foot 2 Views Left  10/15/2011  *RADIOLOGY REPORT*  Clinical Data: Penetrating wounds to the ball of the left foot, with worsening erythema and blistering.  LEFT FOOT - 2 VIEW  Comparison: None.  Findings: There is no evidence of fracture or dislocation.  No osseous erosions are seen to suggest osteomyelitis.  The joint spaces are preserved.  There is no evidence of talar subluxation; the subtalar joint is  unremarkable in appearance.  Soft tissue swelling and defect are noted along the plantar aspect of the forefoot.  IMPRESSION:  1.  No evidence of fracture or dislocation.  No definite evidence for osteomyelitis, though if there is significant clinical concern for osteomyelitis, MRI could be considered for further evaluation. 2.  Soft tissue defect and swelling along the plantar aspect of the forefoot.  Original Report Authenticated By: Tonia Ghent, M.D.   Dg Foot 2 Views Right  10/15/2011  *RADIOLOGY REPORT*  Clinical Data: Penetrating wounds to the ball of the right foot from blisters, with diffuse erythema and pain.  RIGHT FOOT - 2 VIEW  Comparison: None.  Findings: There is no evidence of fracture or dislocation.  No osseous erosions are seen to suggest osteomyelitis.  The joint spaces are preserved.  There is no evidence of talar subluxation; the subtalar joint is unremarkable in appearance.  Significant diffuse soft tissue air is noted tracking about the first toe and dorsal to the forefoot, concerning for necrotizing fasciitis.  Underlying soft tissue defects are noted along the plantar aspect of the forefoot.  IMPRESSION:  1.  No evidence of fracture or dislocation.  No  definite evidence for osteomyelitis, though if there is significant clinical concern for osteomyelitis, MRI could be considered for further evaluation. 2.  Significant diffuse soft tissue air about the first toe and dorsum of the forefoot, concerning for necrotizing fasciitis.  Findings were discussed with Earley Favor at 01:03 a.m. on 10/14/2011.  Original Report Authenticated By: Tonia Ghent, M.D.    ECG: AFlutter with F waves noted inferior leads, normal axis. Wide, deep Q waves noted all inferior leads and V5-6. QRS borderline wide. Deep STD in V2, less so in contiguous leads, however does have STD in I, aVL and isolated STE in III. No prior for comparison.   Impression Present on Admission:  .CAD (coronary artery disease),  autologous vein bypass graft .HTN (hypertension) .Diabetic foot ulcers .Diabetes mellitus .Soft tissue infection of foot  55yoM with h/o 3v CABG then stents x2 (BMS and DES, unclear where), diabetes, and HTN  presents with bilateral plantar 1st MTP DM foot ulcers and right foot soft tissue  infection likely due to DM ulcer. Also found to by hyperglyemic, elevated Cr, and in  Aflutter with RVR.   1. Bilateral plantar ulcers with right foot cellulitis, blister formation, and  subcutaneous gas: Right foot looks infected with portal of entry plantar diabetic ulcer.  The left foot has a planter diabetic ulcer but doesn't look infected. Ortho has seen the  patient, recommend debridement, possibly need for amputation to salvage limb.   - Plan MRI of R foot, appreciate ortho involvement, keep NPO for potential debridement  in the morning or afternoon  - Vancomycin/Zosyn renally dosed, however if swab does not come back with MRSA would  narrow down to MSSA coverage  - Diabetes consultation, would consult   2. AFlutter: Tachy to 150's and has probably rate-related ischemic changes on EKG with  isolated ST depression in V2, contiguous ST depression in I, aVL, and isolated ST  elevation in III. Luckily, he is not symptomatic at all.  - Rule out MI with enzymes to make sure he doesn't have demand ischemic event, trend  ECG's - Start with IV Dilt boluses, may need drip and if so will move to SDU. Flutter can be  notoriously difficult to control so probably need to address underlying illness for full  resolution  3. Diabetes: Currently hyperglycemic but not in DKA.  - A1c, SSI while admitted, DM education consult   - IVF's, hold home Metformin    4. Acute vs chronic kidney disease: Renal 28 / 1.5 with no baseline. Given  comorbidities, suspect likely chronic, but can't be sure. Pt also appears dry, possibly  pre-renal.  - IVF's and trend BMET to see if improved.   5. CAD s/p CABG, stents  x2: Pt on no appropriate cardiac meds.  - A1c, lipid panel - Would start at least ASA 81 mg daily on discharge, but hold for now given OR soon.   6. Social: Difficult home situation because he cares for his dad with Alzheimer's,  younger brother lives at home as well but doesn't get along with the dad at all and  patient is in between the two, creating significant stress. Pt is concerned about who  will care for the dad while he is in the hospital, is requesting help.   - SW consult  Telemetry, team 5 Full code, discussed  Other plans as per orders.  Ole Lafon 10/15/2011, 4:12 AM

## 2011-10-15 NOTE — Consult Note (Signed)
Reason for Consult: a flutter with RVR   Referring Physician: [facility] hospitalist   [name] is an 55 y.o. male.    Chief Complaint: Bilateral plantar 1st MTP DM foot ulcers & rt. Foot soft tissue pain with infection.   HPI: 1 yr. Male presented to the emergency room secondary to foot ulcers and right foot pain which was infected. On arrival he was found to be in a flutter with rapid ventricular response. He is currently on IV heparin and IV Cardizem. Additionally his troponin I was 2.0. He does have a cardiac history with bypass grafting x3 vessels and then stents x2 one bare-metal and one drug-eluting stent at [facility] he believes this was done in 1980s.. No recent followup. Other history does include diabetes and hypertension and again on arrival in the ER he was hyperglycemic.  Patient is usually followed at [facility]. On admission he stated that he had had plantar ulcers under his first metacarpal for the last couple of months but has not changed his activities of daily living. This past week he's had more significant right foot pain that had over the last couple of days increased significantly. The dorsum of the right foot has started blistering. He stated he had subjective chills but no fevers that he was aware of. He's been more sleepy and sleeping more but does not feel ill. He had been unable to see his primary care physician Maralyn Sago came to the emergency room.  On arrival heart rate was 150 with a flutter, glucose was 511 he was given IV insulin. Wound culture was done of his foot left foot plain film revealed no evidence of ostia but there was soft tissue defect and swelling along the plantar aspect of the forefoot. Right foot plain film showed no evidence of ostia of the significant diffuse soft tissue air about the first toe and dorsum of forefoot concerning for necrotizing fasciitis. Orthopedic consult has also been obtained. IV vancomycin was ordered for  the patient as well.  Currently continues in a flutter with heart rate 112 on IV diltiazem and IV heparin. Denies any chest pain. No awareness of tachycardia yesterday or today. Does admit to shortness of breath with exertion over the last one to 2 weeks.  Patient stated he did not like to remember about his heart issue because it was in the past.  Past Medical History  Diagnosis Date  . CAD (coronary artery disease), autologous vein bypass graft     s/p CABG in 80's or 90's, then cath x2, last in the 90's with DES and BMS placed but not sure where. Dr. Lupita Shutter at [facility]   . HTN (hypertension)   . 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 Diabetes mellitus   . Anxiety   . Headache   . Atrial flutter with rapid ventricular response 10/15/2011    Past Surgical History  Procedure Date  . Coronary artery bypass graft   . No past surgeries     Family History  Problem Relation Age of Onset  . Alzheimer's disease Father   . Melanoma Father   . Benign prostatic hyperplasia Father   . Coronary artery disease Father     s/p CABG  . Heart attack Brother   . Coronary artery disease Brother     s/p CABG  . Lymphoma Sister     non Hodgkin's   . Aortic  aneurysm Mother   . Anemia Mother    Patient stated his father and his brother both have had bypass surgeries of the coronary arteries.  Social History:  reports that he quit smoking about 3 months ago. His smoking use included Cigarettes. He has a 17.5 pack-year smoking history. He does not have any smokeless tobacco history on file. He reports that he does not drink alcohol or use illicit drugs. He is single and no children  Allergies: No Known Allergies  Medications Prior to Admission  Medication Dose Route Frequency Provider Last Rate Last Dose  . 0.9 %  sodium chloride infusion   Intravenous Once Arman Filter, NP 50 mL/hr at 10/15/11 0006    . 0.9 %  sodium  chloride infusion   Intravenous Continuous Carlota Raspberry, MD 100 mL/hr at 10/15/11 0934    . acetaminophen (TYLENOL) tablet 650 mg  650 mg Oral Q6H PRN Carlota Raspberry, MD       Or  . acetaminophen (TYLENOL) suppository 650 mg  650 mg Rectal Q6H PRN Carlota Raspberry, MD      . aspirin tablet 325 mg  325 mg Oral Daily Vijaya Akula      . diltiazem (CARDIZEM) 100 mg in dextrose 5 % 100 mL infusion  5-15 mg/hr Intravenous Titrated Vijaya Akula 10 mL/hr at 10/15/11 1231 10 mg/hr at 10/15/11 1231  . diltiazem (CARDIZEM) injection 10 mg  10 mg Intravenous Once Carlota Raspberry, MD   10 mg at 10/15/11 0447  . docusate sodium (COLACE) capsule 100 mg  100 mg Oral BID Carlota Raspberry, MD      . gadobenate dimeglumine (MULTIHANCE) injection 20 mL  20 mL Intravenous Once PRN Medication Radiologist   20 mL at 10/15/11 0955  . heparin 100 units/mL bolus via infusion 4,000 Units  4,000 Units Intravenous Once Kendra P Hiatt, PHARMD      . heparin ADULT infusion 100 units/ml (25000 units/250 ml)  1,200 Units/hr Intravenous Continuous Kendra P Hiatt, PHARMD      . HYDROmorphone (DILAUDID) injection 1 mg  1 mg Intravenous Q4H PRN Carlota Raspberry, MD      . insulin aspart (novoLOG) injection 0-15 Units  0-15 Units Subcutaneous TID WC Carlota Raspberry, MD   11 Units at 10/15/11 1202  . insulin aspart (novoLOG) injection 10 Units  10 Units Subcutaneous Once Arman Filter, NP   10 Units at 10/15/11 0255  . metoprolol tartrate (LOPRESSOR) tablet 12.5 mg  12.5 mg Oral BID Vijaya Akula   12.5 mg at 10/15/11 1209  . ondansetron (ZOFRAN) injection 4 mg  4 mg Intravenous Once Arman Filter, NP   4 mg at 10/15/11 0126  . ondansetron (ZOFRAN) tablet 4 mg  4 mg Oral Q6H PRN Carlota Raspberry, MD       Or  . ondansetron [facility]) injection 4 mg  4 mg Intravenous Q6H PRN Carlota Raspberry, MD      . oxyCODONE (Oxy IR/ROXICODONE) immediate release tablet 5 mg  5 mg Oral Q4H PRN Carlota Raspberry, MD      . piperacillin-tazobactam (ZOSYN) IVPB 3.375 g  3.375 g Intravenous Q8H Gary Fleet Abbott, PHARMD   3.375 g at 10/15/11 1610  . polyethylene glycol (MIRALAX / GLYCOLAX) packet 17 g  17 g Oral Daily PRN Carlota Raspberry, MD      . rosuvastatin (CRESTOR) tablet 20 mg  20 mg Oral q1800 Vijaya Akula      . senna (SENOKOT) tablet 8.6 mg  1 tablet  Oral BID Carlota Raspberry, MD      . vancomycin (VANCOCIN) IVPB 1000 mg/200 mL premix  1,000 mg Intravenous Once Arman Filter, NP   1,000 mg at 10/15/11 0004  . vancomycin (VANCOCIN) IVPB 1000 mg/200 mL premix  1,000 mg Intravenous Q12H Gary Fleet Abbott, [place]      . DISCONTD: 0.9 %  sodium chloride infusion   Intravenous Continuous Arman Filter, NP 10 mL/hr at 10/15/11 0449 1,000 mL at 10/15/11 0449  . DISCONTD: aspirin tablet 325 mg  325 mg Oral Daily Vijaya Akula      . DISCONTD: heparin injection 5,000 Units  5,000 Units Subcutaneous Q8H Carlota Raspberry, MD      . DISCONTD: HYDROmorphone (DILAUDID) injection 0.5 mg  0.5 mg Intravenous Q2H PRN Arman Filter, NP   0.5 mg at 10/15/11 0334  . DISCONTD: insulin regular (HUMULIN R,NOVOLIN R) 1 Units/mL in sodium chloride 0.9 % 100 mL infusion   Intravenous Continuous Arman Filter, NP      . DISCONTD: insulin regular (HUMULIN R,NOVOLIN R) 1 Units/mL in sodium chloride 0.9 % 100 mL infusion   Intravenous To Minor Judie Bonus Hammons, PHARMD      . DISCONTD: metoprolol (LOPRESSOR) injection 5 mg  5 mg Intravenous Once Vijaya Akula       Medications Prior to Admission  Medication Sig Dispense Refill  . metFORMIN (GLUCOPHAGE) 500 MG tablet Take 500 mg by mouth 2 (two) times daily with a meal.          Results for orders placed during the hospital encounter of 10/14/11 (from the past 48 hour(s))  CBC     Status: Abnormal   Collection Time   10/14/11 10:33 PM      Component Value Range Comment   WBC 10.4  4.0 - 10.5 (K/uL)    RBC 5.10  4.22 - 5.81 (MIL/uL)    Hemoglobin 14.4  13.0 - 17.0 (g/dL)    HCT 40.9  81.1 - 91.4 (%)    MCV 80.4  78.0 - 100.0 (fL)    MCH 28.2  26.0 - 34.0 (pg)     MCHC 35.1  30.0 - 36.0 (g/dL)    RDW 78.2  95.6 - 21.3 (%)    Platelets 123 (*) 150 - 400 (K/uL)   DIFFERENTIAL     Status: Abnormal   Collection Time   10/14/11 10:33 PM      Component Value Range Comment   Neutrophils Relative 81 (*) 43 - 77 (%)    Neutro Abs 8.4 (*) 1.7 - 7.7 (K/uL)    Lymphocytes Relative 6 (*) 12 - 46 (%)    Lymphs Abs 0.7  0.7 - 4.0 (K/uL)    Monocytes Relative 12  3 - 12 (%)    Monocytes Absolute 1.3 (*) 0.1 - 1.0 (K/uL)    Eosinophils Relative 0  0 - 5 (%)    Eosinophils Absolute 0.0  0.0 - 0.7 (K/uL)    Basophils Relative 0  0 - 1 (%)    Basophils Absolute 0.0  0.0 - 0.1 (K/uL)   POCT I-STAT, CHEM 8     Status: Abnormal   Collection Time   10/14/11 10:54 PM      Component Value Range Comment   Sodium 130 (*) 135 - 145 (mEq/L)    Potassium 5.0  3.5 - 5.1 (mEq/L)    Chloride 94 (*) 96 - 112 (mEq/L)    BUN 28 (*) 6 - 23 (mg/dL)  Creatinine, Ser 1.50 (*) 0.50 - 1.35 (mg/dL)    Glucose, Bld 696 (*) 70 - 99 (mg/dL)    Calcium, Ion 2.95 (*) 1.12 - 1.32 (mmol/L)    TCO2 23  0 - 100 (mmol/L)    Hemoglobin 15.3  13.0 - 17.0 (g/dL)    HCT 28.4  13.2 - 44.0 (%)   GLUCOSE, CAPILLARY     Status: Abnormal   Collection Time   10/15/11  2:11 AM      Component Value Range Comment   Glucose-Capillary 460 (*) 70 - 99 (mg/dL)   GLUCOSE, CAPILLARY     Status: Abnormal   Collection Time   10/15/11  3:06 AM      Component Value Range Comment   Glucose-Capillary 415 (*) 70 - 99 (mg/dL)   GLUCOSE, CAPILLARY     Status: Abnormal   Collection Time   10/15/11  4:48 AM      Component Value Range Comment   Glucose-Capillary 306 (*) 70 - 99 (mg/dL)   GLUCOSE, CAPILLARY     Status: Abnormal   Collection Time   10/15/11  6:53 AM      Component Value Range Comment   Glucose-Capillary 293 (*) 70 - 99 (mg/dL)   MRSA PCR SCREENING     Status: Normal   Collection Time   10/15/11  7:07 AM      Component Value Range Comment   MRSA by PCR NEGATIVE  NEGATIVE    CARDIAC PANEL(CRET  KIN+CKTOT+MB+TROPI)     Status: Abnormal   Collection Time   10/15/11  9:11 AM      Component Value Range Comment   Total CK 157  7 - 232 (U/L)    CK, MB 8.0 (*) 0.3 - 4.0 (ng/mL)    Troponin I 2.98 (*) <0.30 (ng/mL)    Relative Index 5.1 (*) 0.0 - 2.5    LIPID PANEL     Status: Abnormal   Collection Time   10/15/11  9:12 AM      Component Value Range Comment   Cholesterol 150  0 - 200 (mg/dL)    Triglycerides 102 (*) <150 (mg/dL)    HDL 13 (*) >72 (mg/dL)    Total CHOL/HDL Ratio 11.5      VLDL 37  0 - 40 (mg/dL)    LDL Cholesterol 536 (*) 0 - 99 (mg/dL)   COMPREHENSIVE METABOLIC PANEL     Status: Abnormal   Collection Time   10/15/11 11:14 AM      Component Value Range Comment   Sodium 130 (*) 135 - 145 (mEq/L)    Potassium 4.0  3.5 - 5.1 (mEq/L)    Chloride 93 (*) 96 - 112 (mEq/L)    CO2 22  19 - 32 (mEq/L)    Glucose, Bld 358 (*) 70 - 99 (mg/dL)    BUN 22  6 - 23 (mg/dL)    Creatinine, Ser 6.44  0.50 - 1.35 (mg/dL)    Calcium 8.5  8.4 - 10.5 (mg/dL)    Total Protein 6.7  6.0 - 8.3 (g/dL)    Albumin 2.2 (*) 3.5 - 5.2 (g/dL)    AST 26  0 - 37 (U/L)    ALT 18  0 - 53 (U/L)    Alkaline Phosphatase 75  39 - 117 (U/L)    Total Bilirubin 0.3  0.3 - 1.2 (mg/dL)    GFR calc non Af Amer >90  >90 (mL/min)    GFR calc Af Amer >90  >  90 (mL/min)   MAGNESIUM     Status: Normal   Collection Time   10/15/11 11:14 AM      Component Value Range Comment   Magnesium 2.1  1.5 - 2.5 (mg/dL)   GLUCOSE, CAPILLARY     Status: Abnormal   Collection Time   10/15/11 11:36 AM      Component Value Range Comment   Glucose-Capillary 343 (*) 70 - 99 (mg/dL)    Dg Foot 2 Views Left  10/15/2011  *RADIOLOGY REPORT*  Clinical Data: Penetrating wounds to the ball of the left foot, with worsening erythema and blistering.  LEFT FOOT - 2 VIEW  Comparison: None.  Findings: There is no evidence of fracture or dislocation.  No osseous erosions are seen to suggest osteomyelitis.  The joint spaces are preserved.   There is no evidence of talar subluxation; the subtalar joint is unremarkable in appearance.  Soft tissue swelling and defect are noted along the plantar aspect of the forefoot.  IMPRESSION:  1.  No evidence of fracture or dislocation.  No definite evidence for osteomyelitis, though if there is significant clinical concern for osteomyelitis, MRI could be considered for further evaluation. 2.  Soft tissue defect and swelling along the plantar aspect of the forefoot.  Original Report Authenticated By: Tonia Ghent, M.D.   Dg Foot 2 Views Right  10/15/2011  *RADIOLOGY REPORT*  Clinical Data: Penetrating wounds to the ball of the right foot from blisters, with diffuse erythema and pain.  RIGHT FOOT - 2 VIEW  Comparison: None.  Findings: There is no evidence of fracture or dislocation.  No osseous erosions are seen to suggest osteomyelitis.  The joint spaces are preserved.  There is no evidence of talar subluxation; the subtalar joint is unremarkable in appearance.  Significant diffuse soft tissue air is noted tracking about the first toe and dorsal to the forefoot, concerning for necrotizing fasciitis.  Underlying soft tissue defects are noted along the plantar aspect of the forefoot.  IMPRESSION:  1.  No evidence of fracture or dislocation.  No definite evidence for osteomyelitis, though if there is significant clinical concern for osteomyelitis, MRI could be considered for further evaluation. 2.  Significant diffuse soft tissue air about the first toe and dorsum of the forefoot, concerning for necrotizing fasciitis.  Findings were discussed with Earley Favor at 01:03 a.m. on 10/14/2011.  Original Report Authenticated By: Tonia Ghent, M.D.    ROS: General :denies colds or fevers Skin:large blister right foot HEENT:no blurred vision Cardiovascular:denies chest pain Pulmonary:shortness of breath with exertion over the last 2 weeks JX:BJYNWG diarrhea constipation or melena GU:no  hematuria Musculoskeletal:difficult to walk A. Increased pain with the right foot with walking. Endocrine: diabetic uncontrolled on admission Neuro: no lightheadedness dizziness or syncope.  Blood pressure 125/77, pulse 121, temperature 98.6 F (37 C), temperature source Oral, resp. rate 16, height 5\' 7"  (1.702 m), weight 78.5 kg (173 lb 1 oz), SpO2 99.00%. PE: General:alert, oriented, white male no acute distress flat affect but pleasant. Skin:warm and dry brisk capillary refill. Right foot with large black blister. HEENT:normocephalic, sclera clear. Neck:supple, no JVD no carotid bruits. Heart:S1-S2 regular rate and rhythm no obvious murmur gallop rub or click. Lungs: clear without rales rhonchi or wheezes. Abdomen:soft nontender positive bowel sounds do not palpate liver spleen or masses. Extremities:right foot as previously described swollen. Neuro:alert and oriented x3 follows commands moves all extremities    Assessment/Plan Patient Active Problem List  Diagnoses  . CAD (coronary artery disease),  autologous vein bypass graft  . HTN (hypertension)  . Diabetic foot ulcers  . Diabetes mellitus  . Soft tissue infection of foot  . Atrial flutter with rapid ventricular response   Plan: agree with IV Cardizem and IV heparin. Will recheck EKG as I cannot currently find the ones done in the ER.  Patient's troponin is elevated, Maybe demand ischemia but with cardiac history and unknown timing of last evaluation evaluation may be non-STEMI.  Continue serial cardiac enzymes, serial EKGs. 2-D echo also.. Dr. Tresa Endo to see the patient for further recommendations.       INGOLD,LAURA R 10/15/2011, 12:32 PM        Patient seen and examined. Agree with assessment and plan.Hx is well outlined as above.  Pt presents now with A flutter with RVR in setting of r foot infection ? Necrotizing fasciitis.  Will obtain LE duplex to make certain no DVT, Check D-Dimer and evlauate for PE if  positive.  Currently on IV cardizem drip.  Increase metoprolol to 50 mg bid.  Will check 2 -d echo, f/u serial ecg's.  May need ischemic evaluation if significant abnormalities are noted.  Lennette Bihari, MD, [facility] 10/15/2011 1:29 PM

## 2011-10-15 NOTE — Consult Note (Signed)
Reason for Consult: Right foot infection Referring Physician: [facility] hospitalist  [name] is an 55 y.o. male.  HPI: [name] is a 55 year old patient with two-day history of right foot pain and worsening redness. He denies constitutional symptoms of fever chills sweats and malaise. He does report some drainage from the plantar aspect of the right foot. He has had a plantar ulcer at the base of the first metatarsal for months. He has not undergone significant treatment for this problem. It did not worsen significantly until a couple of days ago.  Past Medical History  Diagnosis Date  . Diabetes mellitus     No past surgical history on file.  No family history on file.  Social History:  reports that he has never smoked. He does not have any smokeless tobacco history on file. He reports that he does not drink alcohol or use illicit drugs.  Allergies: No Known Allergies  Medications: I have reviewed the patient's current medications.  Results for orders placed during the hospital encounter of 10/14/11 (from the past 48 hour(s))  CBC     Status: Abnormal   Collection Time   10/14/11 10:33 PM      Component Value Range Comment   WBC 10.4  4.0 - 10.5 (K/uL)    RBC 5.10  4.22 - 5.81 (MIL/uL)    Hemoglobin 14.4  13.0 - 17.0 (g/dL)    HCT 08.6  57.8 - 46.9 (%)    MCV 80.4  78.0 - 100.0 (fL)    MCH 28.2  26.0 - 34.0 (pg)    MCHC 35.1  30.0 - 36.0 (g/dL)    RDW 62.9  52.8 - 41.3 (%)    Platelets 123 (*) 150 - 400 (K/uL)   DIFFERENTIAL     Status: Abnormal   Collection Time   10/14/11 10:33 PM      Component Value Range Comment   Neutrophils Relative 81 (*) 43 - 77 (%)    Neutro Abs 8.4 (*) 1.7 - 7.7 (K/uL)    Lymphocytes Relative 6 (*) 12 - 46 (%)    Lymphs Abs 0.7  0.7 - 4.0 (K/uL)    Monocytes Relative 12  3 - 12 (%)    Monocytes Absolute 1.3 (*) 0.1 - 1.0 (K/uL)    Eosinophils Relative 0  0 - 5 (%)    Eosinophils Absolute 0.0  0.0 - 0.7 (K/uL)    Basophils Relative 0  0 - 1  (%)    Basophils Absolute 0.0  0.0 - 0.1 (K/uL)   POCT I-STAT, CHEM 8     Status: Abnormal   Collection Time   10/14/11 10:54 PM      Component Value Range Comment   Sodium 130 (*) 135 - 145 (mEq/L)    Potassium 5.0  3.5 - 5.1 (mEq/L)    Chloride 94 (*) 96 - 112 (mEq/L)    BUN 28 (*) 6 - 23 (mg/dL)    Creatinine, Ser 2.44 (*) 0.50 - 1.35 (mg/dL)    Glucose, Bld 010 (*) 70 - 99 (mg/dL)    Calcium, Ion 2.72 (*) 1.12 - 1.32 (mmol/L)    TCO2 23  0 - 100 (mmol/L)    Hemoglobin 15.3  13.0 - 17.0 (g/dL)    HCT 53.6  64.4 - 03.4 (%)   GLUCOSE, CAPILLARY     Status: Abnormal   Collection Time   10/15/11  2:11 AM      Component Value Range Comment   Glucose-Capillary 460 (*)  70 - 99 (mg/dL)     Dg Foot 2 Views Left  10/15/2011  *RADIOLOGY REPORT*  Clinical Data: Penetrating wounds to the ball of the left foot, with worsening erythema and blistering.  LEFT FOOT - 2 VIEW  Comparison: None.  Findings: There is no evidence of fracture or dislocation.  No osseous erosions are seen to suggest osteomyelitis.  The joint spaces are preserved.  There is no evidence of talar subluxation; the subtalar joint is unremarkable in appearance.  Soft tissue swelling and defect are noted along the plantar aspect of the forefoot.  IMPRESSION:  1.  No evidence of fracture or dislocation.  No definite evidence for osteomyelitis, though if there is significant clinical concern for osteomyelitis, MRI could be considered for further evaluation. 2.  Soft tissue defect and swelling along the plantar aspect of the forefoot.  Original Report Authenticated By: Tonia Ghent, M.D.   Dg Foot 2 Views Right  10/15/2011  *RADIOLOGY REPORT*  Clinical Data: Penetrating wounds to the ball of the right foot from blisters, with diffuse erythema and pain.  RIGHT FOOT - 2 VIEW  Comparison: None.  Findings: There is no evidence of fracture or dislocation.  No osseous erosions are seen to suggest osteomyelitis.  The joint spaces are preserved.   There is no evidence of talar subluxation; the subtalar joint is unremarkable in appearance.  Significant diffuse soft tissue air is noted tracking about the first toe and dorsal to the forefoot, concerning for necrotizing fasciitis.  Underlying soft tissue defects are noted along the plantar aspect of the forefoot.  IMPRESSION:  1.  No evidence of fracture or dislocation.  No definite evidence for osteomyelitis, though if there is significant clinical concern for osteomyelitis, MRI could be considered for further evaluation. 2.  Significant diffuse soft tissue air about the first toe and dorsum of the forefoot, concerning for necrotizing fasciitis.  Findings were discussed with Earley Favor at 01:03 a.m. on 10/14/2011.  Original Report Authenticated By: Tonia Ghent, M.D.    Review of Systems  Constitutional: Negative.   HENT: Negative.   Eyes: Negative.   Respiratory: Negative.   Cardiovascular: Negative.   Gastrointestinal: Negative.   Genitourinary: Negative.   Skin: Negative.   Neurological: Negative.    Blood pressure 139/84, pulse 58, temperature 98.7 F (37.1 C), temperature source Oral, resp. rate 16, SpO2 100.00%. Physical Exam on physical examination the patient is well-developed well-nourished no acute distress his heart rate is 90 blood pressure 140/80. He does not appear septic. Right foot is examined he does have blistering dorsally between the first and second toes. There is redness extending dorsally along the along the forefoot. There is no crepitus in the foot or distal tibia. There is a little erythema on the anterior medial tibial crest which extends about 10-12 cm. There is no posterior tenderness on the right-hand side. He does have bilateral decreased sensation dorsal and plantar aspect of the foot pedal pulses are trace palpable. No lesser toe deformities are present. Does have deep ulcerations grade 4 at the plantar base of the first metatarsal on the right grade 3 on the  left. He has palpable in tag nontender and tissue posterior tib peroneal and Achilles tendons bilaterally. Mild heel cord contracture is present. Radiographs do show no overt bony destruction but there is some gas in the tissue between the first and second metatarsal heads around the area of this deep ulceration. There is some drainage from the plantar ulcer on the right.  Assessment/Plan: Impression is right foot infection. I don't think this represents generalized sepsis or early necrotizing fasciitis. There is some gas in the tissue but it is likely from the deep plantar ulcer. Infection and dorsal erythema is present. Patient needs MRI scanning and I&D tomorrow. He has diminished pulses and diminished sensation in the foot making this a potentially limb threatening infection. He does not appear septic at the present time. Plan is for n.p.o. MRI scan and initial debridement tomorrow. Patient understands and will proceed with admission to the [facility] service with operative therapy in the morning versus later in the day after the scan.  [name] 10/15/2011, 3:00 AM

## 2011-10-15 NOTE — Progress Notes (Signed)
Pt had a 2.2 second pause. Vitals are stable. Dr. Blake Divine was notified.

## 2011-10-15 NOTE — Progress Notes (Signed)
ANTICOAGULATION CONSULT NOTE - Follow Up Consult  Pharmacy Consult for Heparin Indication: Aflutter, elevated troponins  No Known Allergies  Patient Measurements: Height: 5\' 7"  (170.2 cm) Weight: 173 lb 1 oz (78.5 kg) IBW/kg (Calculated) : 66.1  Heparin Dosing Weight: 78.5 kg  Vital Signs: Temp: 97.9 F (36.6 C) (12/02 1424) Temp src: Oral (12/02 1424) BP: 127/76 mmHg (12/02 1815) Pulse Rate: 97  (12/02 1815)  Labs:  Basename 10/15/11 1816 10/15/11 1605 10/15/11 1114 10/15/11 0911 10/14/11 2254 10/14/11 2233  HGB -- -- -- -- 15.3 14.4  HCT -- -- -- -- 45.0 41.0  PLT -- -- -- -- -- 123*  APTT -- -- -- -- -- --  LABPROT -- -- -- -- -- --  INR -- -- -- -- -- --  HEPARINUNFRC 0.17* -- -- -- -- --  CREATININE -- -- 0.91 -- 1.50* --  CKTOTAL -- 115 -- 157 -- --  CKMB -- 6.2* -- 8.0* -- --  TROPONINI -- 2.11* -- 2.98* -- --   Estimated Creatinine Clearance: 83.7 ml/min (by C-G formula based on Cr of 0.91).   Medications:  Prescriptions prior to admission  Medication Sig Dispense Refill  . ALPRAZolam (XANAX) 1 MG tablet Take 1 mg by mouth. anxiety      . metFORMIN (GLUCOPHAGE) 500 MG tablet Take 500 mg by mouth 2 (two) times daily with a meal.         Scheduled:    . sodium chloride   Intravenous Once  . aspirin  325 mg Oral Daily  . diltiazem  10 mg Intravenous Once  . docusate sodium  100 mg Oral BID  . heparin  4,000 Units Intravenous Once  . insulin aspart  0-15 Units Subcutaneous TID WC  . insulin aspart  10 Units Subcutaneous Once  . metoprolol tartrate  12.5 mg Oral BID  . ondansetron  4 mg Intravenous Once  . piperacillin-tazobactam (ZOSYN)  IV  3.375 g Intravenous Q8H  . rosuvastatin  20 mg Oral q1800  . senna  1 tablet Oral BID  . vancomycin  1,000 mg Intravenous Once  . vancomycin  1,000 mg Intravenous Q12H  . DISCONTD: aspirin  325 mg Oral Daily  . DISCONTD: heparin  5,000 Units Subcutaneous Q8H  . DISCONTD: insulin (NOVOLIN-R) infusion   Intravenous  To Minor  . DISCONTD: metoprolol  5 mg Intravenous Once    Assessment: 55 y.o. M on heparin for Aflutter and elevated troponins with a SUBtherapeutic heparin level this evening (HL 0.17). No signs/symptoms of bleeding noted per nurse report.   Goal of Therapy:  Heparin level of 0.3-0.7   Plan:  1. Give additional heparin bolus of 2500 units x 1 2. Increase heparin drip rate to 1450 units/hr (14.5 ml/hr) 3. Will continue to monitor for any signs/symptoms of bleeding and will follow up with heparin level in 6 hours   Rolley Sims 10/15/2011,7:07 PM

## 2011-10-15 NOTE — Progress Notes (Signed)
Patient has foot infxn on right - mri shows osteo and abcess 1st mt space - he needs 1st ray and second toe amputation in the very near future when medically ready - call me when this is the case, he will need to have a period off heparin before surgery

## 2011-10-16 LAB — GLUCOSE, CAPILLARY

## 2011-10-16 LAB — CBC
HCT: 34.7 % — ABNORMAL LOW (ref 39.0–52.0)
Hemoglobin: 11.5 g/dL — ABNORMAL LOW (ref 13.0–17.0)
MCH: 26.7 pg (ref 26.0–34.0)
MCHC: 33.1 g/dL (ref 30.0–36.0)
RDW: 13.6 % (ref 11.5–15.5)

## 2011-10-16 LAB — BASIC METABOLIC PANEL
BUN: 18 mg/dL (ref 6–23)
Calcium: 7.9 mg/dL — ABNORMAL LOW (ref 8.4–10.5)
GFR calc Af Amer: >90 mL/min (ref >90)
GFR calc non Af Amer: 90 mL/min — ABNORMAL LOW (ref >90)
Glucose, Bld: 463 mg/dL — ABNORMAL HIGH (ref 70–99)
Sodium: 130 mEq/L — ABNORMAL LOW (ref 135–145)

## 2011-10-16 MED ORDER — INSULIN ASPART 100 UNIT/ML ~~LOC~~ SOLN
0.0000 [IU] | Freq: Three times a day (TID) | SUBCUTANEOUS | Status: DC
  Administered 2011-10-16 (×2): 15 [IU] via SUBCUTANEOUS
  Administered 2011-10-17: 3 [IU] via SUBCUTANEOUS
  Administered 2011-10-17: 11 [IU] via SUBCUTANEOUS
  Administered 2011-10-18: 15 [IU] via SUBCUTANEOUS
  Administered 2011-10-18 – 2011-10-19 (×3): 5 [IU] via SUBCUTANEOUS
  Administered 2011-10-19: 3 [IU] via SUBCUTANEOUS
  Administered 2011-10-19 – 2011-10-20 (×2): 5 [IU] via SUBCUTANEOUS
  Administered 2011-10-20 (×2): 3 [IU] via SUBCUTANEOUS
  Administered 2011-10-21 (×2): 5 [IU] via SUBCUTANEOUS
  Administered 2011-10-21 – 2011-10-22 (×2): 2 [IU] via SUBCUTANEOUS
  Administered 2011-10-22: 5 [IU] via SUBCUTANEOUS
  Administered 2011-10-22: 3 [IU] via SUBCUTANEOUS
  Administered 2011-10-23 (×2): 2 [IU] via SUBCUTANEOUS
  Administered 2011-10-24 (×2): 3 [IU] via SUBCUTANEOUS
  Administered 2011-10-24: 2 [IU] via SUBCUTANEOUS
  Administered 2011-10-25 – 2011-10-26 (×3): 3 [IU] via SUBCUTANEOUS
  Administered 2011-10-26: 17:00:00 5 [IU] via SUBCUTANEOUS
  Administered 2011-10-26 – 2011-10-27 (×2): 3 [IU] via SUBCUTANEOUS
  Administered 2011-10-27 – 2011-10-31 (×7): 2 [IU] via SUBCUTANEOUS
  Filled 2011-10-16 (×2): qty 3

## 2011-10-16 MED ORDER — INSULIN ASPART 100 UNIT/ML ~~LOC~~ SOLN
10.0000 [IU] | Freq: Three times a day (TID) | SUBCUTANEOUS | Status: DC
  Administered 2011-10-17 – 2011-10-23 (×19): 10 [IU] via SUBCUTANEOUS

## 2011-10-16 MED ORDER — INSULIN ASPART 100 UNIT/ML ~~LOC~~ SOLN
5.0000 [IU] | Freq: Three times a day (TID) | SUBCUTANEOUS | Status: DC
  Administered 2011-10-16: 5 [IU] via SUBCUTANEOUS
  Filled 2011-10-16: qty 3

## 2011-10-16 MED ORDER — INSULIN ASPART 100 UNIT/ML ~~LOC~~ SOLN
0.0000 [IU] | Freq: Every day | SUBCUTANEOUS | Status: DC
  Administered 2011-10-16: 5 [IU] via SUBCUTANEOUS
  Administered 2011-10-19: 3 [IU] via SUBCUTANEOUS
  Filled 2011-10-16: qty 3

## 2011-10-16 MED ORDER — INSULIN GLARGINE 100 UNIT/ML ~~LOC~~ SOLN
10.0000 [IU] | SUBCUTANEOUS | Status: DC
  Administered 2011-10-16: 10 [IU] via SUBCUTANEOUS
  Filled 2011-10-16: qty 3

## 2011-10-16 MED ORDER — INSULIN GLARGINE 100 UNIT/ML ~~LOC~~ SOLN
25.0000 [IU] | SUBCUTANEOUS | Status: DC
  Administered 2011-10-17 – 2011-10-19 (×3): 25 [IU] via SUBCUTANEOUS

## 2011-10-16 MED ORDER — DILTIAZEM HCL 30 MG PO TABS
30.0000 mg | ORAL_TABLET | Freq: Four times a day (QID) | ORAL | Status: AC
  Administered 2011-10-16 – 2011-10-18 (×9): 30 mg via ORAL
  Filled 2011-10-16 (×12): qty 1

## 2011-10-16 MED ORDER — LIVING WELL WITH DIABETES BOOK
Freq: Once | Status: AC
  Administered 2011-10-16: 15:00:00
  Filled 2011-10-16: qty 1

## 2011-10-16 NOTE — Progress Notes (Signed)
ANTICOAGULATION CONSULT NOTE - Follow Up Consult  Pharmacy Consult for Heparin Indication: Aflutter, elevated troponins  No Known Allergies  Patient Measurements: Height: 5\' 7"  (170.2 cm) Weight: 173 lb 1 oz (78.5 kg) IBW/kg (Calculated) : 66.1  Heparin Dosing Weight: 78.5 kg  Vital Signs: Temp: 97.5 F (36.4 C) (12/02 2100) Temp src: Oral (12/02 2100) BP: 104/67 mmHg (12/03 0312) Pulse Rate: 74  (12/03 0312)  Labs:  Basename 10/16/11 0248 10/15/11 2148 10/15/11 1816 10/15/11 1605 10/15/11 1114 10/15/11 0911 10/14/11 2254 10/14/11 2233  HGB -- -- -- -- -- -- 15.3 14.4  HCT -- -- -- -- -- -- 45.0 41.0  PLT -- -- -- -- -- -- -- 123*  APTT -- -- -- -- -- -- -- --  LABPROT -- -- -- -- -- -- -- --  INR -- -- -- -- -- -- -- --  HEPARINUNFRC 0.40 -- 0.17* -- -- -- -- --  CREATININE -- -- -- -- 0.91 -- 1.50* --  CKTOTAL -- 109 -- 115 -- 157 -- --  CKMB -- 5.2* -- 6.2* -- 8.0* -- --  TROPONINI -- <0.30 -- 2.11* -- 2.98* -- --   Estimated Creatinine Clearance: 83.7 ml/min (by C-G formula based on Cr of 0.91).   Assessment: 55 y.o. M on heparin for Aflutter and elevated troponins.   Goal of Therapy:  Heparin level of 0.3-0.7   Plan:  Continue Heparin at current rate   Eddie Candle 10/16/2011,4:07 AM

## 2011-10-16 NOTE — Progress Notes (Signed)
Physical Therapy Treatment Patient Details Name: [name] MRN: [id number] DOB: [birth year] Today's Date: 10/16/2011  Pt currently on strict bedrest.  PT will hold today.  Please update activity orders if appropriate.  Thanks!!!  Ether Wolters 10/16/2011, 8:35 AM  [phone]

## 2011-10-16 NOTE — Progress Notes (Addendum)
Pt. Seen and examined. Agree with the NP/PA-C note as written.  Agree with Lexiscan at some point, however, no matter what the result of the stress test, he is high risk for surgery given his recent MI.  If we can wait at least 48 hrs after his troponin normalized, that would be a slightly lower risk.  If the stress test were abnormal, we would not pursue a cath with active wound infection, osteomyelitis, etc, and intervention will delay his surgery if he were on antiplatelets. I would therefore proceed with surgery. He is aware of his difficult situation.  Will review echo results. I would not adjust his lopressor dose at this time. We will continue to follow.  Chrystie Nose, MD Attending Cardiologist [facility]

## 2011-10-16 NOTE — Progress Notes (Signed)
CSW completed full assessment and placed in shadow chart. Per patient, no CSW needs. CSW is signing off but can be reconsulted if needed. Unk Lightning [phone]

## 2011-10-16 NOTE — Progress Notes (Signed)
10/16/11 Nursing 1100 am Patient CBG was 449. MD notified. New orders given. Ernesta Amble, RN  10/16/11 Nursing 1710 Patient CBG is 487. MD notified. No new orders given. Ernesta Amble, RN

## 2011-10-16 NOTE — Progress Notes (Signed)
Marcelino Duster from [employer] called back and asked to notify cardiology of earlier pause.  Charmian Muff, PA made aware. No new orders. Will continue to monitor.   [name]

## 2011-10-16 NOTE — Progress Notes (Signed)
Utilization Review Completed.[name] T12/01/2011   

## 2011-10-16 NOTE — Progress Notes (Signed)
  Echocardiogram 2D Echocardiogram has been performed.  Kortlynn Poust Nira Retort 10/16/2011, 10:56 AM

## 2011-10-16 NOTE — Progress Notes (Signed)
Subjective: Pt is comfortbale, denies any new complaints. He denies chest pain and sob. Echo pending. 2 sec pause overnight , twice.    Objective: Weight change: 1.469 kg (3 lb 3.8 oz)  Intake/Output Summary (Last 24 hours) at 10/16/11 1457 Last data filed at 10/16/11 1245  Gross per 24 hour  Intake   2634 ml  Output   2425 ml  Net    209 ml   On exam he is alert and afebrile comfortable. CVS S1 AND S2 heard.  Lungs clear Abdomen: soft non tender, non distended, bowel sounds heard Extremities: right foot gangrenous, with superficial cellulitic changes. Neuro:  Non focal.  Lab Results: Results for orders placed during the hospital encounter of 10/14/11 (from the past 24 hour(s))  GLUCOSE, CAPILLARY     Status: Abnormal   Collection Time   10/15/11 10:56 PM      Component Value Range   Glucose-Capillary 482 (*) 70 - 99 (mg/dL)   Comment 1 Notify RN     Comment 2 Documented in Chart    HEPARIN LEVEL (UNFRACTIONATED)     Status: Normal   Collection Time   10/16/11  2:48 AM      Component Value Range   Heparin Unfractionated 0.40  0.30 - 0.70 (IU/mL)  GLUCOSE, CAPILLARY     Status: Abnormal   Collection Time   10/16/11  6:58 AM      Component Value Range   Glucose-Capillary 388 (*) 70 - 99 (mg/dL)   Comment 1 Notify RN     Comment 2 Documented in Chart    CBC     Status: Abnormal   Collection Time   10/16/11  9:40 AM      Component Value Range   WBC 7.4  4.0 - 10.5 (K/uL)   RBC 4.30  4.22 - 5.81 (MIL/uL)   Hemoglobin 11.5 (*) 13.0 - 17.0 (g/dL)   HCT 16.1 (*) 09.6 - 52.0 (%)   MCV 80.7  78.0 - 100.0 (fL)   MCH 26.7  26.0 - 34.0 (pg)   MCHC 33.1  30.0 - 36.0 (g/dL)   RDW 04.5  40.9 - 81.1 (%)   Platelets 106 (*) 150 - 400 (K/uL)  BASIC METABOLIC PANEL     Status: Abnormal   Collection Time   10/16/11  9:40 AM      Component Value Range   Sodium 130 (*) 135 - 145 (mEq/L)   Potassium 3.9  3.5 - 5.1 (mEq/L)   Chloride 93 (*) 96 - 112 (mEq/L)   CO2 25  19 - 32  (mEq/L)   Glucose, Bld 463 (*) 70 - 99 (mg/dL)   BUN 18  6 - 23 (mg/dL)   Creatinine, Ser 9.14  0.50 - 1.35 (mg/dL)   Calcium 7.9 (*) 8.4 - 10.5 (mg/dL)   GFR calc non Af Amer 90 (*) >90 (mL/min)   GFR calc Af Amer >90  >90 (mL/min)  GLUCOSE, CAPILLARY     Status: Abnormal   Collection Time   10/16/11 11:10 AM      Component Value Range   Glucose-Capillary 449 (*) 70 - 99 (mg/dL)  GLUCOSE, CAPILLARY     Status: Abnormal   Collection Time   10/16/11  5:02 PM      Component Value Range   Glucose-Capillary 497 (*) 70 - 99 (mg/dL)  GLUCOSE, CAPILLARY     Status: Abnormal   Collection Time   10/16/11  9:24 PM      Component  Value Range   Glucose-Capillary 398 (*) 70 - 99 (mg/dL)   Comment 1 Notify RN       Micro Results: Recent Results (from the past 240 hour(s))  WOUND CULTURE     Status: Normal (Preliminary result)   Collection Time   10/14/11 10:29 PM      Component Value Range Status Comment   Specimen Description WOUND RIGHT POSTERIOR FOOT   Final    Special Requests NONE   Final    Gram Stain PENDING   Incomplete    Culture ABUNDANT PSEUDOMONAS AERUGINOSA   Final    Report Status PENDING   Incomplete   MRSA PCR SCREENING     Status: Normal   Collection Time   10/15/11  7:07 AM      Component Value Range Status Comment   MRSA by PCR NEGATIVE  NEGATIVE  Final     Studies/Results: Ct Angio Chest W/cm &/or Wo Cm  10/15/2011  *RADIOLOGY REPORT*  Clinical Data:  Chest pain with shortness of breath and elevated D- dimer levels.  Diabetes.  Question pulmonary embolism.  CT ANGIOGRAPHY CHEST WITH CONTRAST  Technique:  Multidetector CT imaging of the chest was performed using the standard protocol during bolus administration of intravenous contrast.  Multiplanar CT image reconstructions including MIPs were obtained to evaluate the vascular anatomy.  Contrast: OMNIPAQUE IOHEXOL 300 MG/ML IV SOLN  Comparison:  None.  Findings:  The pulmonary arteries are well opacified with  contrast. There is no evidence of acute pulmonary embolism.  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  The heart is mildly enlarged.  No enlarged mediastinal or hilar lymph nodes are demonstrated.  A pretracheal node demonstrates a fatty hilum.  There is no pleural or pericardial effusion.  The lungs are clear.  Images through the upper abdomen demonstrate contour irregularity of the liver suspicious for cirrhosis.  The spleen appears mildly enlarged.  There are multiple small calcified gallstones.  Review of the MIP images confirms the above findings.  IMPRESSION:  1.  No evidence of acute pulmonary embolism or other acute chest process. 2.  Cholelithiasis. 3.  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  Correlate clinically. 4.  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  Original Report Authenticated By: Gerrianne Scale, M.D.   Mr Foot Right W Wo Contrast  10/15/2011  *RADIOLOGY REPORT*  Clinical Data: Infected diabetic foot ulcer.  MRI OF THE RIGHT FOREFOOT WITHOUT AND WITH CONTRAST  Technique:  Multiplanar, multisequence MR imaging was performed both before and after administration of intravenous contrast.  Contrast: 20mL MULTIHANCE GADOBENATE DIMEGLUMINE 529 MG/ML IV SOLN  Comparison: Right foot radiographs.  Findings: There is a large soft tissue abscess involving the medial aspect of the forefoot it is largely between the first and second toe is extending along the dorsum of the first metatarsal.  As noted on the plain films there is extensive gas formation in the soft tissues.  No obvious changes of septic arthritis but there are findings for osteomyelitis involving the first metatarsal and first proximal phalanx.  There is associated diffuse cellulitis and myofasciitis.  No involvement of the forefoot or visualized hind foot bony structures.  IMPRESSION:  1.  Large soft tissue abscess involving the medial forefoot with gas formation. 2.  Osteomyelitis involving the first metatarsal and  first proximal phalanx. 3.  Diffuse cellulitis and myofasciitis.  Original Report Authenticated By: P. Loralie Champagne, M.D.   Dg Foot 2 Views Left  10/15/2011  *RADIOLOGY REPORT*  Clinical Data: Penetrating wounds to the ball of the left foot, with worsening erythema and blistering.  LEFT FOOT - 2 VIEW  Comparison: None.  Findings: There is no evidence of fracture or dislocation.  No osseous erosions are seen to suggest osteomyelitis.  The joint spaces are preserved.  There is no evidence of talar subluxation; the subtalar joint is unremarkable in appearance.  Soft tissue swelling and defect are noted along the plantar aspect of the forefoot.  IMPRESSION:  1.  No evidence of fracture or dislocation.  No definite evidence for osteomyelitis, though if there is significant clinical concern for osteomyelitis, MRI could be considered for further evaluation. 2.  Soft tissue defect and swelling along the plantar aspect of the forefoot.  Original Report Authenticated By: Tonia Ghent, M.D.   Dg Foot 2 Views Right  10/15/2011  *RADIOLOGY REPORT*  Clinical Data: Penetrating wounds to the ball of the right foot from blisters, with diffuse erythema and pain.  RIGHT FOOT - 2 VIEW  Comparison: None.  Findings: There is no evidence of fracture or dislocation.  No osseous erosions are seen to suggest osteomyelitis.  The joint spaces are preserved.  There is no evidence of talar subluxation; the subtalar joint is unremarkable in appearance.  Significant diffuse soft tissue air is noted tracking about the first toe and dorsal to the forefoot, concerning for necrotizing fasciitis.  Underlying soft tissue defects are noted along the plantar aspect of the forefoot.  IMPRESSION:  1.  No evidence of fracture or dislocation.  No definite evidence for osteomyelitis, though if there is significant clinical concern for osteomyelitis, MRI could be considered for further evaluation. 2.  Significant diffuse soft tissue air about the first  toe and dorsum of the forefoot, concerning for necrotizing fasciitis.  Findings were discussed with Earley Favor at 01:03 a.m. on 10/14/2011.  Original Report Authenticated By: Tonia Ghent, M.D.   Medications: Scheduled Meds:   . aspirin  325 mg Oral Daily  . diltiazem  30 mg Oral Q6H  . docusate sodium  100 mg Oral BID  . heparin  2,500 Units Intravenous Once  . insulin aspart  0-15 Units Subcutaneous TID WC  . insulin aspart  0-5 Units Subcutaneous QHS  . insulin aspart  5 Units Subcutaneous TID WC  . insulin aspart  6 Units Subcutaneous Once  . insulin glargine  10 Units Subcutaneous Q0700  . living well with diabetes book   Does not apply Once  . metoprolol tartrate  12.5 mg Oral BID  . piperacillin-tazobactam (ZOSYN)  IV  3.375 g Intravenous Q8H  . rosuvastatin  20 mg Oral q1800  . senna  1 tablet Oral BID  . vancomycin  1,000 mg Intravenous Q12H  . DISCONTD: insulin aspart  0-15 Units Subcutaneous TID WC   Continuous Infusions:   . sodium chloride 100 mL/hr at 10/16/11 1104  . diltiazem (CARDIZEM) infusion 10 mg/hr (10/16/11 0309)  . heparin 1,450 Units/hr (10/16/11 0639)   PRN Meds:.acetaminophen, acetaminophen, HYDROmorphone, iohexol, ondansetron (ZOFRAN) IV, ondansetron, oxyCODONE, polyethylene glycol  Assessment/Plan: Patient Active Hospital Problem List: Soft tissue infection of foot (10/15/2011)/ Osteomyelitis of the right foot, on iv vanco and zosyn. Wound cultures growing pseudomonas, will await for sensitivities before discontinuing the vancomycin. Awaiting Cardiology clearance for amputation of the right ist and 2nd toe for osteomyelitis.    CAD (coronary artery disease), autologous vein bypass graft () NSTEMI/ DEMAND ischemia currently on IV heparin. Echo pending.  HTN (hypertension) () Controlled.  Diabetes  mellitus () Uncontrolled. /Incrased the lantus and SSI.  Atrial flutter with rapid ventricular response (10/15/2011) Rate controlled. On po cardizem and  iv heparin for anticoagulation. Further recommendations as per cardiology. Continue with metoprolol.   LOS: 2 days   Jessy Cybulski 10/16/2011, 2:57 PM

## 2011-10-16 NOTE — Progress Notes (Signed)
Subjective: No chest pain, no awareness of tachycardia.  Objective: Vital signs in last 24 hours: Temp:  [97.5 F (36.4 C)-98.6 F (37 C)] 98.6 F (37 C) (12/03 0434) Pulse Rate:  [74-121] 93  (12/03 1103) Resp:  [18-19] 18  (12/03 0434) BP: (104-127)/(67-77) 121/77 mmHg (12/03 1103) SpO2:  [95 %-97 %] 97 % (12/03 0434) Weight:  [79.969 kg (176 lb 4.8 oz)] 176 lb 4.8 oz (79.969 kg) (12/03 0434) Weight change: 1.469 kg (3 lb 3.8 oz) Last BM Date: 10/15/11 Intake/Output from previous day:  -86 12/02 0701 - 12/03 0700 In: 1914 [P.O.:120; I.V.:1494; IV Piggyback:300] Out: 2000 [Urine:2000] Intake/Output this shift: Total I/O In: 240 [P.O.:240] Out: 200 [Urine:200]  PE: General:A X O X 3,  Heart:S1S2 irreg irreg Lungs:clear ABD:+BS soft non tender EXT:Rt foot without change.  Lab Results:  Basename 10/16/11 0940 10/14/11 2254 10/14/11 2233  WBC 7.4 -- 10.4  HGB 11.5* 15.3 --  HCT 34.7* 45.0 --  PLT PENDING -- 123*   BMET  Basename 10/16/11 0940 10/15/11 1114  NA 130* 130*  K 3.9 4.0  CL 93* 93*  CO2 25 22  GLUCOSE 463* 358*  BUN 18 22  CREATININE 0.98 0.91  CALCIUM 7.9* 8.5    Basename 10/15/11 2148 10/15/11 1605  TROPONINI <0.30 2.11*    Lab Results  Component Value Date   CHOL 150 10/15/2011   HDL 13* 10/15/2011   LDLCALC 100* 10/15/2011   TRIG 183* 10/15/2011   CHOLHDL 11.5 10/15/2011   Lab Results  Component Value Date   HGBA1C 13.2* 10/15/2011     No results found for this basename: TSH    Hepatic Function Panel  Basename 10/15/11 1114  PROT 6.7  ALBUMIN 2.2*  AST 26  ALT 18  ALKPHOS 75  BILITOT 0.3  BILIDIR --  IBILI --    Basename 10/15/11 0912  CHOL 150   No results found for this basename: PROTIME in the last 72 hours    EKG: Orders placed during the hospital encounter of 10/14/11  . ED EKG  . ED EKG  . EKG 12-LEAD  . EKG 12-LEAD  . EKG 12-LEAD  . EKG 12-LEAD  . EKG 12-LEAD  . EKG 12-LEAD  . EKG 12-LEAD      Studies/Results: Ct Angio Chest W/cm &/or Wo Cm  10/15/2011  *RADIOLOGY REPORT*  Clinical Data:  Chest pain with shortness of breath and elevated D- dimer levels.  Diabetes.  Question pulmonary embolism.  CT ANGIOGRAPHY CHEST WITH CONTRAST  Technique:  Multidetector CT imaging of the chest was performed using the standard protocol during bolus administration of intravenous contrast.  Multiplanar CT image reconstructions including MIPs were obtained to evaluate the vascular anatomy.  Contrast: OMNIPAQUE IOHEXOL 300 MG/ML IV SOLN  Comparison:  None.  Findings:  The pulmonary arteries are well opacified with contrast. There is no evidence of acute pulmonary embolism.  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  The heart is mildly enlarged.  No enlarged mediastinal or hilar lymph nodes are demonstrated.  A pretracheal node demonstrates a fatty hilum.  There is no pleural or pericardial effusion.  The lungs are clear.  Images through the upper abdomen demonstrate contour irregularity of the liver suspicious for cirrhosis.  The spleen appears mildly enlarged.  There are multiple small calcified gallstones.  Review of the MIP images confirms the above findings.  IMPRESSION:  1.  No evidence of acute pulmonary embolism or other acute chest process. 2.  Cholelithiasis.  3.  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  Correlate clinically. 4.  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  Original Report Authenticated By: Gerrianne Scale, M.D.   Mr Foot Right W Wo Contrast  10/15/2011  *RADIOLOGY REPORT*  Clinical Data: Infected diabetic foot ulcer.  MRI OF THE RIGHT FOREFOOT WITHOUT AND WITH CONTRAST  Technique:  Multiplanar, multisequence MR imaging was performed both before and after administration of intravenous contrast.  Contrast: 20mL MULTIHANCE GADOBENATE DIMEGLUMINE 529 MG/ML IV SOLN  Comparison: Right foot radiographs.  Findings: There is a large soft tissue abscess involving the medial  aspect of the forefoot it is largely between the first and second toe is extending along the dorsum of the first metatarsal.  As noted on the plain films there is extensive gas formation in the soft tissues.  No obvious changes of septic arthritis but there are findings for osteomyelitis involving the first metatarsal and first proximal phalanx.  There is associated diffuse cellulitis and myofasciitis.  No involvement of the forefoot or visualized hind foot bony structures.  IMPRESSION:  1.  Large soft tissue abscess involving the medial forefoot with gas formation. 2.  Osteomyelitis involving the first metatarsal and first proximal phalanx. 3.  Diffuse cellulitis and myofasciitis.  Original Report Authenticated By: P. Loralie Champagne, M.D.   Dg Foot 2 Views Left  10/15/2011  *RADIOLOGY REPORT*  Clinical Data: Penetrating wounds to the ball of the left foot, with worsening erythema and blistering.  LEFT FOOT - 2 VIEW  Comparison: None.  Findings: There is no evidence of fracture or dislocation.  No osseous erosions are seen to suggest osteomyelitis.  The joint spaces are preserved.  There is no evidence of talar subluxation; the subtalar joint is unremarkable in appearance.  Soft tissue swelling and defect are noted along the plantar aspect of the forefoot.  IMPRESSION:  1.  No evidence of fracture or dislocation.  No definite evidence for osteomyelitis, though if there is significant clinical concern for osteomyelitis, MRI could be considered for further evaluation. 2.  Soft tissue defect and swelling along the plantar aspect of the forefoot.  Original Report Authenticated By: Tonia Ghent, M.D.   Dg Foot 2 Views Right  10/15/2011  *RADIOLOGY REPORT*  Clinical Data: Penetrating wounds to the ball of the right foot from blisters, with diffuse erythema and pain.  RIGHT FOOT - 2 VIEW  Comparison: None.  Findings: There is no evidence of fracture or dislocation.  No osseous erosions are seen to suggest  osteomyelitis.  The joint spaces are preserved.  There is no evidence of talar subluxation; the subtalar joint is unremarkable in appearance.  Significant diffuse soft tissue air is noted tracking about the first toe and dorsal to the forefoot, concerning for necrotizing fasciitis.  Underlying soft tissue defects are noted along the plantar aspect of the forefoot.  IMPRESSION:  1.  No evidence of fracture or dislocation.  No definite evidence for osteomyelitis, though if there is significant clinical concern for osteomyelitis, MRI could be considered for further evaluation. 2.  Significant diffuse soft tissue air about the first toe and dorsum of the forefoot, concerning for necrotizing fasciitis.  Findings were discussed with Earley Favor at 01:03 a.m. on 10/14/2011.  Original Report Authenticated By: Tonia Ghent, M.D.    Medications: I have reviewed the patient's current medications.  Assessment/Plan: Patient Active Problem List  Diagnoses  . CAD (coronary artery disease), autologous vein bypass graft  . HTN (hypertension)  . Diabetic foot ulcers  .  Diabetes mellitus  . Soft tissue infection of foot  . Atrial flutter with rapid ventricular response   PLAN:   Rate controlled A.Flutter, will change to PO meds. Did have pause during the night of 2.39 sec..  If EF low would increase lopressor.  2D Echo has been done, not yet read.  Pk Troponin 2.98 with CK 157 and MB of 8.0. ? NSTEMI vs. Demand ischemia from tachycardia?  Need Lexiscan myoview. With known CAD s/p CABG numerous years ago and hx. Of stents.  MRI of Rt foot with osteo and abcess needing 1st ? and 2nd toe amputation in near future.  Will need to be off Heparin prior to surgery.  LOS: 2 days   Jimesha Rising R 10/16/2011, 11:39 AM

## 2011-10-16 NOTE — Consult Note (Signed)
WOC consult Note Reason for Consult: Consult requested for BLE foot wounds. MRI indicates osteomylitis and possible necrotizing fascitis.  This complex problem is beyond WOC scope of practice and requires an ORTHO CONSULT for further assessment and plan of care.   Will not plan to follow further unless re-consulted.  Mardee Postin, RN, MSN, [employer]  [phone]

## 2011-10-16 NOTE — Progress Notes (Signed)
Patient needs surgery for his foot when medically ready No worsening c/w admission - on iv abx Page [phone] when primary team allows for or

## 2011-10-16 NOTE — Progress Notes (Signed)
Pt. Alarmed 2.39 second pause.  Pt. Sleeping.  BP 104/67 HR 70s in a-flutter.  [name] from [facility] made aware.  Will continue to monitor patient.  [name]

## 2011-10-17 ENCOUNTER — Inpatient Hospital Stay (HOSPITAL_COMMUNITY): Payer: Self-pay | Admitting: Anesthesiology

## 2011-10-17 ENCOUNTER — Encounter (HOSPITAL_COMMUNITY): Payer: Self-pay | Admitting: Anesthesiology

## 2011-10-17 ENCOUNTER — Encounter (HOSPITAL_COMMUNITY): Payer: Self-pay | Admitting: Cardiology

## 2011-10-17 ENCOUNTER — Other Ambulatory Visit: Payer: Self-pay | Admitting: Orthopedic Surgery

## 2011-10-17 ENCOUNTER — Encounter (HOSPITAL_COMMUNITY)
Admission: EM | Disposition: A | Discharge status: Skilled Nursing Facility | Payer: Self-pay | Source: Home / Self Care | Attending: Internal Medicine

## 2011-10-17 DIAGNOSIS — I214 Non-ST elevation (NSTEMI) myocardial infarction: Secondary | ICD-10-CM

## 2011-10-17 HISTORY — PX: AMPUTATION: SHX166

## 2011-10-17 HISTORY — DX: Non-ST elevation (NSTEMI) myocardial infarction: I21.4

## 2011-10-17 LAB — CBC
HCT: 31.7 % — ABNORMAL LOW (ref 39.0–52.0)
MCHC: 32.8 g/dL (ref 30.0–36.0)
MCV: 80.7 fL (ref 78.0–100.0)
RDW: 13.5 % (ref 11.5–15.5)

## 2011-10-17 LAB — GLUCOSE, CAPILLARY
Glucose-Capillary: 160 mg/dL — ABNORMAL HIGH (ref 70–99)
Glucose-Capillary: 162 mg/dL — ABNORMAL HIGH (ref 70–99)
Glucose-Capillary: 207 mg/dL — ABNORMAL HIGH (ref 70–99)
Glucose-Capillary: 207 mg/dL — ABNORMAL HIGH (ref 70–99)

## 2011-10-17 LAB — SURGICAL PCR SCREEN: MRSA, PCR: NEGATIVE

## 2011-10-17 SURGERY — AMPUTATION, FOOT, RAY
Anesthesia: General | Site: Foot | Laterality: Right | Wound class: Dirty or Infected

## 2011-10-17 MED ORDER — ONDANSETRON HCL 4 MG/2ML IJ SOLN
INTRAMUSCULAR | Status: DC | PRN
  Administered 2011-10-17: 4 mg via INTRAVENOUS

## 2011-10-17 MED ORDER — SODIUM CHLORIDE 0.9 % IR SOLN
Status: DC | PRN
  Administered 2011-10-17: 3000 mL

## 2011-10-17 MED ORDER — FENTANYL CITRATE 0.05 MG/ML IJ SOLN
INTRAMUSCULAR | Status: DC | PRN
  Administered 2011-10-17 (×5): 50 ug via INTRAVENOUS

## 2011-10-17 MED ORDER — HYDROMORPHONE HCL PF 1 MG/ML IJ SOLN
0.2500 mg | INTRAMUSCULAR | Status: DC | PRN
Start: 2011-10-17 — End: 2011-10-17
  Administered 2011-10-17 (×2): 0.5 mg via INTRAVENOUS

## 2011-10-17 MED ORDER — LACTATED RINGERS IV SOLN
INTRAVENOUS | Status: DC
  Administered 2011-10-18: 01:00:00 via INTRAVENOUS

## 2011-10-17 MED ORDER — PROMETHAZINE HCL 25 MG/ML IJ SOLN: 6.2500 mg | INTRAMUSCULAR | Status: DC | PRN

## 2011-10-17 MED ORDER — HEPARIN SOD (PORCINE) IN D5W 100 UNIT/ML IV SOLN
1450.0000 [IU]/h | INTRAVENOUS | Status: DC
  Administered 2011-10-18: 1450 [IU]/h via INTRAVENOUS
  Filled 2011-10-17 (×2): qty 250

## 2011-10-17 MED ORDER — PROPOFOL 10 MG/ML IV EMUL
INTRAVENOUS | Status: DC | PRN
  Administered 2011-10-17: 150 mg via INTRAVENOUS

## 2011-10-17 MED ORDER — LACTATED RINGERS IV SOLN
INTRAVENOUS | Status: DC | PRN
  Administered 2011-10-17: 20:00:00 via INTRAVENOUS

## 2011-10-17 SURGICAL SUPPLY — 48 items
BANDAGE ELASTIC 4 VELCRO ST LF (GAUZE/BANDAGES/DRESSINGS) ×4 IMPLANT
BLADE AVERAGE 25X9 (BLADE) IMPLANT
BLADE SURG 10 STRL SS (BLADE) ×2 IMPLANT
BNDG COHESIVE 4X5 TAN STRL (GAUZE/BANDAGES/DRESSINGS) IMPLANT
BNDG ESMARK 4X9 LF (GAUZE/BANDAGES/DRESSINGS) ×2 IMPLANT
CLOTH BEACON ORANGE TIMEOUT ST (SAFETY) ×2 IMPLANT
COVER MAYO STAND STRL (DRAPES) IMPLANT
CUFF TOURNIQUET SINGLE 34IN LL (TOURNIQUET CUFF) IMPLANT
CUFF TOURNIQUET SINGLE 44IN (TOURNIQUET CUFF) IMPLANT
DRAPE SURG 17X23 STRL (DRAPES) IMPLANT
DRAPE U-SHAPE 47X51 STRL (DRAPES) ×2 IMPLANT
DRSG ADAPTIC 3X8 NADH LF (GAUZE/BANDAGES/DRESSINGS) IMPLANT
DRSG PAD ABDOMINAL 8X10 ST (GAUZE/BANDAGES/DRESSINGS) ×6 IMPLANT
DURAPREP 26ML APPLICATOR (WOUND CARE) IMPLANT
ELECT CAUTERY BLADE 6.4 (BLADE) ×2 IMPLANT
ELECT REM PT RETURN 9FT ADLT (ELECTROSURGICAL) ×2
ELECTRODE REM PT RTRN 9FT ADLT (ELECTROSURGICAL) ×1 IMPLANT
GAUZE SPONGE 4X4 12PLY STRL LF (GAUZE/BANDAGES/DRESSINGS) ×2 IMPLANT
GAUZE SPONGE 4X4 16PLY XRAY LF (GAUZE/BANDAGES/DRESSINGS) IMPLANT
GAUZE XEROFORM 1X8 LF (GAUZE/BANDAGES/DRESSINGS) ×4 IMPLANT
GLOVE BIOGEL PI IND STRL 8 (GLOVE) ×1 IMPLANT
GLOVE BIOGEL PI INDICATOR 8 (GLOVE) ×1
GLOVE SURG ORTHO 8.0 STRL STRW (GLOVE) ×2 IMPLANT
GOWN PREVENTION PLUS LG XLONG (DISPOSABLE) IMPLANT
GOWN PREVENTION PLUS XLARGE (GOWN DISPOSABLE) ×2 IMPLANT
GOWN STRL NON-REIN LRG LVL3 (GOWN DISPOSABLE) ×2 IMPLANT
HANDPIECE INTERPULSE COAX TIP (DISPOSABLE) ×1
KIT BASIN OR (CUSTOM PROCEDURE TRAY) ×2 IMPLANT
KIT ROOM TURNOVER OR (KITS) ×2 IMPLANT
MANIFOLD NEPTUNE II (INSTRUMENTS) ×2 IMPLANT
NS IRRIG 1000ML POUR BTL (IV SOLUTION) ×2 IMPLANT
PACK ORTHO EXTREMITY (CUSTOM PROCEDURE TRAY) ×2 IMPLANT
PAD ARMBOARD 7.5X6 YLW CONV (MISCELLANEOUS) ×4 IMPLANT
PAD CAST 4YDX4 CTTN HI CHSV (CAST SUPPLIES) ×1 IMPLANT
PADDING CAST COTTON 4X4 STRL (CAST SUPPLIES) ×1
PADDING WEBRIL 4 STERILE (GAUZE/BANDAGES/DRESSINGS) ×2 IMPLANT
SET HNDPC FAN SPRY TIP SCT (DISPOSABLE) ×1 IMPLANT
SPONGE GAUZE 4X4 12PLY (GAUZE/BANDAGES/DRESSINGS) ×2 IMPLANT
SPONGE LAP 18X18 X RAY DECT (DISPOSABLE) ×4 IMPLANT
SUCTION FRAZIER TIP 10 FR DISP (SUCTIONS) IMPLANT
SUT ETHILON 2 0 FS 18 (SUTURE) ×6 IMPLANT
SUT ETHILON 3 0 PS 1 (SUTURE) ×4 IMPLANT
SYR CONTROL 10ML LL (SYRINGE) IMPLANT
TOWEL OR 17X24 6PK STRL BLUE (TOWEL DISPOSABLE) ×2 IMPLANT
TOWEL OR 17X26 10 PK STRL BLUE (TOWEL DISPOSABLE) ×2 IMPLANT
TUBE CONNECTING 12X1/4 (SUCTIONS) ×2 IMPLANT
WATER STERILE IRR 1000ML POUR (IV SOLUTION) IMPLANT
YANKAUER SUCT BULB TIP NO VENT (SUCTIONS) ×2 IMPLANT

## 2011-10-17 NOTE — Progress Notes (Signed)
Subjective: PT slightly anxious about the foot surgery.   Objective: Weight change: 1.724 kg (3 lb 12.8 oz)  Intake/Output Summary (Last 24 hours) at 10/17/11 1854 Last data filed at 10/17/11 1500  Gross per 24 hour  Intake 3352.16 ml  Output   1900 ml  Net 1452.16 ml   On Exam alert, low grade temp, comfortable.   Heart:S1S2 Irreg, irreg.  Lungs:CTAB, no wheezing or rhonchi. ABD: soft non tender , bowel sounds present. Extremities. Right foot is swollen with features of necrotising fascitis, and superficial cellulitic changes.    Lab Results: Results for orders placed during the hospital encounter of 10/14/11 (from the past 24 hour(s))  GLUCOSE, CAPILLARY     Status: Abnormal   Collection Time   10/16/11  9:24 PM      Component Value Range   Glucose-Capillary 398 (*) 70 - 99 (mg/dL)   Comment 1 Notify RN    HEPARIN LEVEL (UNFRACTIONATED)     Status: Normal   Collection Time   10/17/11  6:15 AM      Component Value Range   Heparin Unfractionated 0.42  0.30 - 0.70 (IU/mL)  CBC     Status: Abnormal   Collection Time   10/17/11  6:15 AM      Component Value Range   WBC 8.1  4.0 - 10.5 (K/uL)   RBC 3.93 (*) 4.22 - 5.81 (MIL/uL)   Hemoglobin 10.4 (*) 13.0 - 17.0 (g/dL)   HCT 16.1 (*) 09.6 - 52.0 (%)   MCV 80.7  78.0 - 100.0 (fL)   MCH 26.5  26.0 - 34.0 (pg)   MCHC 32.8  30.0 - 36.0 (g/dL)   RDW 04.5  40.9 - 81.1 (%)   Platelets 113 (*) 150 - 400 (K/uL)  GLUCOSE, CAPILLARY     Status: Abnormal   Collection Time   10/17/11  6:43 AM      Component Value Range   Glucose-Capillary 318 (*) 70 - 99 (mg/dL)  GLUCOSE, CAPILLARY     Status: Abnormal   Collection Time   10/17/11 12:12 PM      Component Value Range   Glucose-Capillary 256 (*) 70 - 99 (mg/dL)   Comment 1 Notify RN     Comment 2 Documented in Chart    GLUCOSE, CAPILLARY     Status: Abnormal   Collection Time   10/17/11  3:07 PM      Component Value Range   Glucose-Capillary 160 (*) 70 - 99 (mg/dL)  GLUCOSE,  CAPILLARY     Status: Abnormal   Collection Time   10/17/11  4:27 PM      Component Value Range   Glucose-Capillary 162 (*) 70 - 99 (mg/dL)     Micro Results: Recent Results (from the past 240 hour(s))  WOUND CULTURE     Status: Normal   Collection Time   10/14/11 10:29 PM      Component Value Range Status Comment   Specimen Description WOUND RIGHT POSTERIOR FOOT   Final    Special Requests NONE   Final    Gram Stain     Final    Value: NO WBC SEEN     NO SQUAMOUS EPITHELIAL CELLS SEEN     RARE GRAM NEGATIVE RODS   Culture ABUNDANT PSEUDOMONAS AERUGINOSA   Final    Report Status 10/17/2011 FINAL   Final    Organism ID, Bacteria PSEUDOMONAS AERUGINOSA   Final   MRSA PCR SCREENING     Status:  Normal   Collection Time   10/15/11  7:07 AM      Component Value Range Status Comment   MRSA by PCR NEGATIVE  NEGATIVE  Final     Studies/Results: Ct Angio Chest W/cm &/or Wo Cm  10/15/2011  *RADIOLOGY REPORT*  Clinical Data:  Chest pain with shortness of breath and elevated D- dimer levels.  Diabetes.  Question pulmonary embolism.  CT ANGIOGRAPHY CHEST WITH CONTRAST  Technique:  Multidetector CT imaging of the chest was performed using the standard protocol during bolus administration of intravenous contrast.  Multiplanar CT image reconstructions including MIPs were obtained to evaluate the vascular anatomy.  Contrast: OMNIPAQUE IOHEXOL 300 MG/ML IV SOLN  Comparison:  None.  Findings:  The pulmonary arteries are well opacified with contrast. There is no evidence of acute pulmonary embolism.  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  The heart is mildly enlarged.  No enlarged mediastinal or hilar lymph nodes are demonstrated.  A pretracheal node demonstrates a fatty hilum.  There is no pleural or pericardial effusion.  The lungs are clear.  Images through the upper abdomen demonstrate contour irregularity of the liver suspicious for cirrhosis.  The spleen appears mildly enlarged.  There are  multiple small calcified gallstones.  Review of the MIP images confirms the above findings.  IMPRESSION:  1.  No evidence of acute pulmonary embolism or other acute chest process. 2.  Cholelithiasis. 3.  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  Correlate clinically. 4.  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  Original Report Authenticated By: Gerrianne Scale, M.D.   Mr Foot Right W Wo Contrast  10/15/2011  *RADIOLOGY REPORT*  Clinical Data: Infected diabetic foot ulcer.  MRI OF THE RIGHT FOREFOOT WITHOUT AND WITH CONTRAST  Technique:  Multiplanar, multisequence MR imaging was performed both before and after administration of intravenous contrast.  Contrast: 20mL MULTIHANCE GADOBENATE DIMEGLUMINE 529 MG/ML IV SOLN  Comparison: Right foot radiographs.  Findings: There is a large soft tissue abscess involving the medial aspect of the forefoot it is largely between the first and second toe is extending along the dorsum of the first metatarsal.  As noted on the plain films there is extensive gas formation in the soft tissues.  No obvious changes of septic arthritis but there are findings for osteomyelitis involving the first metatarsal and first proximal phalanx.  There is associated diffuse cellulitis and myofasciitis.  No involvement of the forefoot or visualized hind foot bony structures.  IMPRESSION:  1.  Large soft tissue abscess involving the medial forefoot with gas formation. 2.  Osteomyelitis involving the first metatarsal and first proximal phalanx. 3.  Diffuse cellulitis and myofasciitis.  Original Report Authenticated By: P. Loralie Champagne, M.D.   Dg Foot 2 Views Left  10/15/2011  *RADIOLOGY REPORT*  Clinical Data: Penetrating wounds to the ball of the left foot, with worsening erythema and blistering.  LEFT FOOT - 2 VIEW  Comparison: None.  Findings: There is no evidence of fracture or dislocation.  No osseous erosions are seen to suggest osteomyelitis.  The joint spaces are  preserved.  There is no evidence of talar subluxation; the subtalar joint is unremarkable in appearance.  Soft tissue swelling and defect are noted along the plantar aspect of the forefoot.  IMPRESSION:  1.  No evidence of fracture or dislocation.  No definite evidence for osteomyelitis, though if there is significant clinical concern for osteomyelitis, MRI could be considered for further evaluation. 2.  Soft tissue defect and  swelling along the plantar aspect of the forefoot.  Original Report Authenticated By: Tonia Ghent, M.D.   Dg Foot 2 Views Right  10/15/2011  *RADIOLOGY REPORT*  Clinical Data: Penetrating wounds to the ball of the right foot from blisters, with diffuse erythema and pain.  RIGHT FOOT - 2 VIEW  Comparison: None.  Findings: There is no evidence of fracture or dislocation.  No osseous erosions are seen to suggest osteomyelitis.  The joint spaces are preserved.  There is no evidence of talar subluxation; the subtalar joint is unremarkable in appearance.  Significant diffuse soft tissue air is noted tracking about the first toe and dorsal to the forefoot, concerning for necrotizing fasciitis.  Underlying soft tissue defects are noted along the plantar aspect of the forefoot.  IMPRESSION:  1.  No evidence of fracture or dislocation.  No definite evidence for osteomyelitis, though if there is significant clinical concern for osteomyelitis, MRI could be considered for further evaluation. 2.  Significant diffuse soft tissue air about the first toe and dorsum of the forefoot, concerning for necrotizing fasciitis.  Findings were discussed with Earley Favor at 01:03 a.m. on 10/14/2011.  Original Report Authenticated By: Tonia Ghent, M.D.   Medications: Scheduled Meds:   . aspirin  325 mg Oral Daily  . diltiazem  30 mg Oral Q6H  . docusate sodium  100 mg Oral BID  . insulin aspart  0-15 Units Subcutaneous TID WC  . insulin aspart  0-5 Units Subcutaneous QHS  . insulin aspart  10 Units  Subcutaneous TID WC  . insulin glargine  25 Units Subcutaneous Q0700  . metoprolol tartrate  12.5 mg Oral BID  . piperacillin-tazobactam (ZOSYN)  IV  3.375 g Intravenous Q8H  . rosuvastatin  20 mg Oral q1800  . senna  1 tablet Oral BID  . vancomycin  1,000 mg Intravenous Q12H  . DISCONTD: insulin aspart  5 Units Subcutaneous TID WC  . DISCONTD: insulin glargine  10 Units Subcutaneous Q0700   Continuous Infusions:   . sodium chloride 100 mL/hr at 10/16/11 1104  . diltiazem (CARDIZEM) infusion Stopped (10/16/11 1836)  . heparin 1,450 Units/hr (10/16/11 2259)   PRN Meds:.acetaminophen, acetaminophen, HYDROmorphone, ondansetron (ZOFRAN) IV, ondansetron, oxyCODONE, polyethylene glycol  Assessment/Plan:  LOS: 3 days   Soft tissue infection of foot (10/15/2011)/ Osteomyelitis of the right foot, on iv vanco and zosyn. Wound cultures growing pseudomonas. Cardiology cleared the patient for surgery( for amputation of the right ist and 2nd toe for osteomyelitis)  CAD (coronary artery disease), autologous vein bypass graft / s/p 2 stent placement: went into atrial flutter on admission was found to have elevated troponins, was  treated for NSTEMI/ Demand ischemia with IV heparin and IV Cardizem. His rate was better controlled and IV cardizem was changed to po cardizem. IV  Heparin is being held for surgery, which will be restarted after surgery. Possible DCCV in the next 1 to 2 days. Continue with aspirin/ metoprolol/ crestor.   Diabetes Mellitus: uncontrolled, on lantus and SSI.       Joda Braatz 10/17/2011, 6:54 PM

## 2011-10-17 NOTE — Transfer of Care (Signed)
Immediate Anesthesia Transfer of Care Note  Patient: [name]  Procedure(s) Performed:  AMPUTATION RAY - First and Second Ray Amputation  Patient Location: PACU  Anesthesia Type: General  Level of Consciousness: awake, alert , oriented and patient cooperative  Airway & Oxygen Therapy: Patient Spontanous Breathing and Patient connected to nasal cannula oxygen  Post-op Assessment: Report given to PACU RN, Post -op Vital signs reviewed and stable and Patient moving all extremities  Post vital signs: Reviewed and stable  Complications: No apparent anesthesia complications

## 2011-10-17 NOTE — Progress Notes (Signed)
Inpatient Diabetes Program Recommendations  AACE/ADA: New Consensus Statement on Inpatient Glycemic Control (2009)  Target Ranges:  Prepandial:   less than 140 mg/dL      Peak postprandial:   less than 180 mg/dL (1-2 hours)      Critically ill patients:  140 - 180 mg/dL  V9D=63.8  Bedside RN providing in-patient basic DM education using DM videos 501-510, [facility] Nursing Consult handouts and 'Living Well with Diabetes' patient education manual.  Will continue to follow. Thank you Piedad Climes RN, Diabetes Coordinator [phone]

## 2011-10-17 NOTE — Anesthesia Preprocedure Evaluation (Addendum)
Anesthesia Evaluation  Patient identified by MRN, date of birth, ID band Patient awake    Reviewed: Allergy & Precautions, H&P , NPO status , Patient's Chart, lab work & pertinent test results, reviewed documented beta blocker date and time   History of Anesthesia Complications Negative for: history of anesthetic complications  Airway Mallampati: III TM Distance: >3 FB Neck ROM: Full    Dental  (+) Missing, Dental Advisory Given and Edentulous Upper   Pulmonary former smoker clear to auscultation  Pulmonary exam normal       Cardiovascular hypertension, Pt. on medications + CAD, + Past MI and + CABG (1980's CABG, 1990's stent x 2, no resid chest pain) Irregular Normal Presented in new onset Afib with elevated troponins. Rate controlled with Dilt, metoprolol.  Took metoprolol today.  ECHO yest-  EF 55-60%, inf-basal hypokinesis.  Cards consult OK to proceed with surgery, will anticoag or cardiovert later   Neuro/Psych Negative Neurological ROS     GI/Hepatic GERD-  Controlled,  Endo/Other  Diabetes mellitus- (glu this evening 160), Type 2, Oral Hypoglycemic Agents  Renal/GU      Musculoskeletal   Abdominal (+) obese,   Peds  Hematology   Anesthesia Other Findings   Reproductive/Obstetrics                        Anesthesia Physical Anesthesia Plan  ASA: III  Anesthesia Plan: General   Post-op Pain Management:    Induction: Intravenous  Airway Management Planned: LMA  Additional Equipment:   Intra-op Plan:   Post-operative Plan:   Informed Consent: I have reviewed the patients History and Physical, chart, labs and discussed the procedure including the risks, benefits and alternatives for the proposed anesthesia with the patient or authorized representative who has indicated his/her understanding and acceptance.   Dental advisory given  Plan Discussed with: CRNA and Surgeon  Anesthesia  Plan Comments: (Plan routine monitors, GA- LMA OK)        Anesthesia Quick Evaluation

## 2011-10-17 NOTE — Progress Notes (Signed)
Pt. Seen and examined. Agree with the NP/PA-C note as written.  Agree .Marland Kitchen We will continue to follow post-op. Call with questions.  Chrystie Nose, MD Attending Cardiologist [facility]

## 2011-10-17 NOTE — Progress Notes (Signed)
Cardiac risk stratification complete Plan for surgery today  Hep off at 1 pm today this afternoon All questions answered

## 2011-10-17 NOTE — Progress Notes (Signed)
Pt orders received, chart reviewed. Pt still on bedrest and noted imminent amputation of the right 1st and 2nd toe for osteomyelitis. PT to hold off on eval at this time. Please update activity orders for PT when appropriate. Thank you.  Clydene Laming, [phone] 10/17/11 8:31

## 2011-10-17 NOTE — Brief Op Note (Signed)
10/14/2011 - 10/17/2011  10:27 PM  PATIENT:  [name]  55 y.o. male  PRE-OPERATIVE DIAGNOSIS:  infected Right Foot  POST-OPERATIVE DIAGNOSIS:  infected Right Foot  PROCEDURE:  Procedure(s): AMPUTATION RAY 1 and 2  SURGEON:  Surgeon(s): Cammy Copa  ASSISTANT:   ANESTHESIA:   general  EBL: 25 ml    Total I/O In: 600 [I.V.:600] Out: 50 [Blood:50]  BLOOD ADMINISTERED: none  DRAINS: none   LOCAL MEDICATIONS USED:  none  SPECIMEN:  No Specimen  COUNTS:  YES  TOURNIQUET:   Total Tourniquet Time Documented: Calf (Right) - 38 minutes  DICTATION: .Other Dictation: Dictation Number [id number]  PLAN OF CARE: Admit to inpatient   PATIENT DISPOSITION:  PACU - hemodynamically stable

## 2011-10-17 NOTE — Progress Notes (Signed)
10/17/11 Nursing 1510 Patient CBG is 160. MD notified to clarify if patient is to receive insulin prior to procedure. MD Blake Divine stated to hold insulin. Ernesta Amble, RN

## 2011-10-17 NOTE — Progress Notes (Signed)
Pt accompanied to pre-op holding area with monitor without any incidents. Pt hand off to [place] Rn in pre op area. Marland Kitchen/

## 2011-10-17 NOTE — Anesthesia Postprocedure Evaluation (Signed)
  Anesthesia Post-op Note  Patient: [name]  Procedure(s) Performed:  AMPUTATION RAY - First and Second Ray Amputation  Patient Location: PACU  Anesthesia Type: General  Level of Consciousness: awake and alert   Airway and Oxygen Therapy: Patient Spontanous Breathing and Patient connected to nasal cannula oxygen  Post-op Pain: mild  Post-op Assessment: Post-op Vital signs reviewed, Patient's Cardiovascular Status Stable, Respiratory Function Stable, Patent Airway, No signs of Nausea or vomiting and Pain level controlled  Post-op Vital Signs: Reviewed and stable  Complications: No apparent anesthesia complications

## 2011-10-17 NOTE — Progress Notes (Signed)
Subjective: No chest pain. No SOB.    68 yr. Male presented to the emergency room secondary to foot ulcers and right foot pain which was infected. On arrival, 10/14/11,  he was found to be in a flutter with rapid ventricular response. He is currently on IV heparin, IV Cardizem has been changed to po cardizem. Additionally his troponin I was 2.0. He does have a cardiac history with bypass grafting x3 vessels and then stents x2 one bare-metal and one drug-eluting stent at [facility] he believes this was done in 1980s.. No recent followup. Other history does include diabetes and hypertension and again on arrival in the ER he was hyperglycemic. Foot concerning for necrotizing fascitis.  For surgery.  Black bullous area of Rt. Foot had ruptured and serous/sang. fluid had drained on the floor. Pt. Without complaints.  Objective: Vital signs in last 24 hours: Temp:  [98.2 F (36.8 C)-99.5 F (37.5 C)] 99.5 F (37.5 C) (12/04 0449) Pulse Rate:  [65-93] 90  (12/04 0449) Resp:  [18-20] 18  (12/04 0449) BP: (114-135)/(61-77) 135/76 mmHg (12/04 0449) SpO2:  [94 %-97 %] 94 % (12/04 0449) Weight:  [81.693 kg (180 lb 1.6 oz)] 180 lb 1.6 oz (81.693 kg) (12/04 0449) Weight change: 1.724 kg (3 lb 12.8 oz) Last BM Date: 10/15/11 Intake/Output from previous day: +926 12/03 0701 - 12/04 0700 In: 3951.5 [P.O.:720; I.V.:2731.5; IV Piggyback:500] Out: 1825 [Urine:1825] Intake/Output this shift:     PE: General: A&O X 3 MAE, follows commands. Heart:S1S2 Irreg, irreg. Lungs:clear, without rales, rhonchi or wheezes. ABD:+BS soft non tender Ext:Rt. Foot, while sitting up in chair, is bright red, blister has drained to the floor. + edema.   Lab Results:  [facility] 10/17/11 0615 10/16/11 0940  WBC 8.1 7.4  HGB 10.4* 11.5*  HCT 31.7* 34.7*  PLT 113* 106*   BMET  Basename 10/16/11 0940 10/15/11 1114  NA 130* 130*  K 3.9 4.0  CL 93* 93*  CO2 25 22  GLUCOSE 463* 358*  BUN 18 22    CREATININE 0.98 0.91  CALCIUM 7.9* 8.5    Basename 10/15/11 2148 10/15/11 1605  TROPONINI <0.30 2.11*    Lab Results  Component Value Date   CHOL 150 10/15/2011   HDL 13* 10/15/2011   LDLCALC 100* 10/15/2011   TRIG 183* 10/15/2011   CHOLHDL 11.5 10/15/2011   Lab Results  Component Value Date   HGBA1C 13.2* 10/15/2011     No results found for this basename: TSH    Hepatic Function Panel  Basename 10/15/11 1114  PROT 6.7  ALBUMIN 2.2*  AST 26  ALT 18  ALKPHOS 75  BILITOT 0.3  BILIDIR --  IBILI --    Basename 10/15/11 0912  CHOL 150   No results found for this basename: PROTIME in the last 72 hours    EKG: Orders placed during the hospital encounter of 10/14/11  . ED EKG  . ED EKG  . EKG 12-LEAD  . EKG 12-LEAD  . EKG 12-LEAD  . EKG 12-LEAD  . EKG 12-LEAD  . EKG 12-LEAD  . EKG 12-LEAD    Studies/Results: Ct Angio Chest W/cm &/or Wo Cm  10/15/2011  *RADIOLOGY REPORT*  Clinical Data:  Chest pain with shortness of breath and elevated D- dimer levels.  Diabetes.  Question pulmonary embolism.  CT ANGIOGRAPHY CHEST WITH CONTRAST  Technique:  Multidetector CT imaging of the chest was performed using the standard protocol during bolus administration of intravenous contrast.  Multiplanar  CT image reconstructions including MIPs were obtained to evaluate the vascular anatomy.  Contrast: OMNIPAQUE IOHEXOL 300 MG/ML IV SOLN  Comparison:  None.  Findings:  The pulmonary arteries are well opacified with contrast. There is no evidence of acute pulmonary embolism.  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  The heart is mildly enlarged.  No enlarged mediastinal or hilar lymph nodes are demonstrated.  A pretracheal node demonstrates a fatty hilum.  There is no pleural or pericardial effusion.  The lungs are clear.  Images through the upper abdomen demonstrate contour irregularity of the liver suspicious for cirrhosis.  The spleen appears mildly enlarged.  There are multiple small  calcified gallstones.  Review of the MIP images confirms the above findings.  IMPRESSION:  1.  No evidence of acute pulmonary embolism or other acute chest process. 2.  Cholelithiasis. 3.  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  Correlate clinically. 4.  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  Original Report Authenticated By: Gerrianne Scale, M.D.   Mr Foot Right W Wo Contrast  10/15/2011  *RADIOLOGY REPORT*  Clinical Data: Infected diabetic foot ulcer.  MRI OF THE RIGHT FOREFOOT WITHOUT AND WITH CONTRAST  Technique:  Multiplanar, multisequence MR imaging was performed both before and after administration of intravenous contrast.  Contrast: 20mL MULTIHANCE GADOBENATE DIMEGLUMINE 529 MG/ML IV SOLN  Comparison: Right foot radiographs.  Findings: There is a large soft tissue abscess involving the medial aspect of the forefoot it is largely between the first and second toe is extending along the dorsum of the first metatarsal.  As noted on the plain films there is extensive gas formation in the soft tissues.  No obvious changes of septic arthritis but there are findings for osteomyelitis involving the first metatarsal and first proximal phalanx.  There is associated diffuse cellulitis and myofasciitis.  No involvement of the forefoot or visualized hind foot bony structures.  IMPRESSION:  1.  Large soft tissue abscess involving the medial forefoot with gas formation. 2.  Osteomyelitis involving the first metatarsal and first proximal phalanx. 3.  Diffuse cellulitis and myofasciitis.  Original Report Authenticated By: P. Loralie Champagne, M.D.    Medications: I have reviewed the patient's current medications.  Assessment/Plan: Patient Active Problem List  Diagnoses  . CAD (coronary artery disease), autologous vein bypass graft  . HTN (hypertension)  . Diabetic foot ulcers  . Diabetes mellitus  . Soft tissue infection of foot  . Atrial flutter with rapid ventricular response   NSTEMI  PLAN:  Pt. Cleared for surgery at high risk.  Hold Heparin 6 hours prior to surgery and resume post op when Ortho agrees secondary to A flutter. Once recovered from infection and anticoagulated poss. DCCV.  EF 55%.  Less pauses with his a. Flutter over last 24 hours.. Currently on cardizem po every 6 hrs, and Lopressor 12.5 BID.    LOS: 3 days   Amyra Vantuyl R 10/17/2011, 8:12 AM

## 2011-10-17 NOTE — Consult Note (Signed)
Wound care consult requested for BLE wounds.  Pt is being followed by ortho service for assessment and plan of care for osteomylitis, which is beyond [facility] scope of practice.  Plans for trip to OR to debride wound.  Please consult ORTHO SERVICE for further plan of care.   Will not plan to follow further unless re-consulted.  [address] RN, MSN, [employer]  [phone]

## 2011-10-18 LAB — GLUCOSE, CAPILLARY
Glucose-Capillary: 227 mg/dL — ABNORMAL HIGH (ref 70–99)
Glucose-Capillary: 404 mg/dL — ABNORMAL HIGH (ref 70–99)

## 2011-10-18 LAB — CBC
HCT: 30.8 % — ABNORMAL LOW (ref 39.0–52.0)
Hemoglobin: 10.3 g/dL — ABNORMAL LOW (ref 13.0–17.0)
RBC: 3.8 MIL/uL — ABNORMAL LOW (ref 4.22–5.81)
WBC: 10.2 10*3/uL (ref 4.0–10.5)

## 2011-10-18 LAB — BASIC METABOLIC PANEL
BUN: 10 mg/dL (ref 6–23)
CO2: 28 mEq/L (ref 19–32)
Calcium: 7.6 mg/dL — ABNORMAL LOW (ref 8.4–10.5)
Creatinine, Ser: 0.85 mg/dL (ref 0.50–1.35)

## 2011-10-18 LAB — HEPARIN LEVEL (UNFRACTIONATED)
Heparin Unfractionated: <0.1 IU/mL — ABNORMAL LOW (ref 0.30–0.70)
Heparin Unfractionated: 0.14 IU/mL — ABNORMAL LOW (ref 0.30–0.70)

## 2011-10-18 MED ORDER — METOPROLOL TARTRATE 25 MG PO TABS
25.0000 mg | ORAL_TABLET | Freq: Two times a day (BID) | ORAL | Status: DC
  Filled 2011-10-18 (×2): qty 1

## 2011-10-18 MED ORDER — METOPROLOL TARTRATE 50 MG PO TABS
50.0000 mg | ORAL_TABLET | Freq: Two times a day (BID) | ORAL | Status: DC
  Administered 2011-10-18 – 2011-10-25 (×15): 50 mg via ORAL
  Filled 2011-10-18 (×16): qty 1

## 2011-10-18 MED ORDER — BD GETTING STARTED TAKE HOME KIT: 3/10ML X 30G SYRINGES
1.0000 | Freq: Once | Status: AC
  Administered 2011-10-18: 1
  Filled 2011-10-18: qty 1

## 2011-10-18 MED ORDER — DILTIAZEM HCL ER COATED BEADS 120 MG PO TB24
120.0000 mg | ORAL_TABLET | Freq: Every day | ORAL | Status: DC
  Administered 2011-10-19 – 2011-10-25 (×7): 120 mg via ORAL
  Filled 2011-10-18 (×7): qty 1

## 2011-10-18 MED ORDER — PANTOPRAZOLE SODIUM 40 MG PO TBEC
40.0000 mg | DELAYED_RELEASE_TABLET | Freq: Every day | ORAL | Status: DC
  Administered 2011-10-18 – 2011-10-31 (×14): 40 mg via ORAL
  Filled 2011-10-18 (×14): qty 1

## 2011-10-18 MED ORDER — HEPARIN SOD (PORCINE) IN D5W 100 UNIT/ML IV SOLN
1600.0000 [IU]/h | INTRAVENOUS | Status: AC
  Administered 2011-10-19 (×2): 1600 [IU]/h via INTRAVENOUS
  Filled 2011-10-18 (×3): qty 250

## 2011-10-18 NOTE — Progress Notes (Signed)
PT Note  Pt with rt toe amputations last PM.  Pt  with only dressing with Ace wrap on rt.foot.  Pt has been up in room.  Feel pt needs some kind of post op shoe or Darco shoe to protect rt foot.  Will defer eval until post-op shoe or Darco shoe ordered and delivered.  Harrison [employer] PT [phone]

## 2011-10-18 NOTE — Op Note (Signed)
[name], [name]                 ACCOUNT NO.:  [id number]  MEDICAL RECORD NO.:  [id number]  LOCATION:  4731                         FACILITY:  [facility]  PHYSICIAN:  Burnard Bunting, M.D.    DATE OF BIRTH:  1956/04/10  DATE OF PROCEDURE:  10/17/2011 DATE OF DISCHARGE:                              OPERATIVE REPORT   PREOPERATIVE DIAGNOSIS:  Right foot infection.  POSTOPERATIVE DIAGNOSIS:  Right foot infection.  PROCEDURES:  Right foot excisional debridement and ray amputation #1 and #2.  SURGEON:  Burnard Bunting, MD  ASSISTANT:  None.  ANESTHESIA:  General endotracheal.  ESTIMATED BLOOD LOSS:  20 mL.  DRAINS:  None.  INDICATIONS:  [name] is a 55 year old patient with right foot infection, who presents for operative management after explanation risks and benefits.  SPECIMENS:  First and second rays sent to Pathology.  Cultures obtained x1.  PROCEDURE IN DETAIL:  The patient brought to operating room where general endotracheal anesthesia was induced.  Preoperative antibiotics was administered.  Right foot was prepped with Hibiclens and saline, draped in a sterile manner.  Time-out was called.  The patient had a blistering bullous-type pus pocket in the dorsal aspect of the first web space measuring about 2 x 4 cm.  This actually extended up to the fibular aspect of the great toe.  There was purulent material within this centered around the toe.  This pocket was opened.  The first and second metatarsal rays were stripped of their soft tissue attachments and excised.  The sesamoids were also excised in accordance with preoperative MRI scanning, which suggested bone infection in the sesamoid.  This did prove to be the case on the plantar aspect. Following ray resection and thorough debridement of all nonvascular, nonviable-appearing tissue, the ankle Esmarch was released.  Fairly minimal bleeding was present.  The bleeding points encountered were controlled using  electrocautery.  Thorough irrigation was performed with 3 L of pulsatile irrigating solution.  The flap was then closed.  Medial flap had some blood flow, but it was not robust.  At this time, a bulky dressing was applied to the foot.  The incision was closed using 2-0 nylon sutures loosely.  The patient tolerated the procedure well without immediate complication.  He was transferred to the recovery room in stable condition.     Burnard Bunting, M.D.     GSD/MEDQ  D:  10/17/2011  T:  10/18/2011  Job:  [id number]

## 2011-10-18 NOTE — Progress Notes (Signed)
Subjective: Status post first and second ray amputation of the right foot last night, denies any significant complaints this morning   Objective: Blood pressure 144/75, pulse 98, temperature 99.5 F (37.5 C), temperature source Oral, resp. rate 18, height 5\' 7"  (1.702 m), weight 81.693 kg (180 lb 1.6 oz), SpO2 92.00%. Weight change:   Intake/Output Summary (Last 24 hours) at 10/18/11 1255 Last data filed at 10/18/11 0900  Gross per 24 hour  Intake   2840 ml  Output   1150 ml  Net   1690 ml   On Exam alert, low grade temp, comfortable.   Gen: Alert and oriented x3 not in any distress Heart:S1S2 Irreg, irreg.  Lungs:CTAB, no wheezing or rhonchi. ABD: soft non tender , bowel sounds present. Extremities. Right foot dressing intact  Neuro: Alert and oriented x3 no focal neurological deficits noted   Lab Results: Results for orders placed during the hospital encounter of 10/14/11 (from the past 24 hour(s))  GLUCOSE, CAPILLARY     Status: Abnormal   Collection Time   10/17/11  3:07 PM      Component Value Range   Glucose-Capillary 160 (*) 70 - 99 (mg/dL)  GLUCOSE, CAPILLARY     Status: Abnormal   Collection Time   10/17/11  4:27 PM      Component Value Range   Glucose-Capillary 162 (*) 70 - 99 (mg/dL)  GLUCOSE, CAPILLARY     Status: Abnormal   Collection Time   10/17/11  7:30 PM      Component Value Range   Glucose-Capillary 207 (*) 70 - 99 (mg/dL)  SURGICAL PCR SCREEN     Status: Normal   Collection Time   10/17/11  7:51 PM      Component Value Range   MRSA, PCR NEGATIVE  NEGATIVE    Staphylococcus aureus NEGATIVE  NEGATIVE   CULTURE, ROUTINE-ABSCESS     Status: Normal (Preliminary result)   Collection Time   10/17/11  9:30 PM      Component Value Range   Specimen Description ABSCESS FOOT RIGHT     Special Requests NONE     Gram Stain       Value: ABUNDANT WBC PRESENT,BOTH PMN AND MONONUCLEAR     RARE SQUAMOUS EPITHELIAL CELLS PRESENT     FEW GRAM POSITIVE COCCI IN PAIRS       RARE GRAM NEGATIVE RODS   Culture Culture reincubated for better growth     Report Status PENDING    ANAEROBIC CULTURE     Status: Normal (Preliminary result)   Collection Time   10/17/11  9:30 PM      Component Value Range   Specimen Description ABSCESS FOOT RIGHT     Special Requests NONE     Gram Stain       Value: ABUNDANT WBC PRESENT,BOTH PMN AND MONONUCLEAR     RARE SQUAMOUS EPITHELIAL CELLS PRESENT     FEW GRAM POSITIVE COCCI IN PAIRS     RARE GRAM NEGATIVE RODS   Culture       Value: NO ANAEROBES ISOLATED; CULTURE IN PROGRESS FOR 5 DAYS   Report Status PENDING    GLUCOSE, CAPILLARY     Status: Abnormal   Collection Time   10/17/11 10:45 PM      Component Value Range   Glucose-Capillary 207 (*) 70 - 99 (mg/dL)  BASIC METABOLIC PANEL     Status: Abnormal   Collection Time   10/18/11  5:38 AM  Component Value Range   Sodium 134 (*) 135 - 145 (mEq/L)   Potassium 3.1 (*) 3.5 - 5.1 (mEq/L)   Chloride 97  96 - 112 (mEq/L)   CO2 28  19 - 32 (mEq/L)   Glucose, Bld 225 (*) 70 - 99 (mg/dL)   BUN 10  6 - 23 (mg/dL)   Creatinine, Ser 1.61  0.50 - 1.35 (mg/dL)   Calcium 7.6 (*) 8.4 - 10.5 (mg/dL)   GFR calc non Af Amer >90  >90 (mL/min)   GFR calc Af Amer >90  >90 (mL/min)  CBC     Status: Abnormal   Collection Time   10/18/11  5:38 AM      Component Value Range   WBC 10.2  4.0 - 10.5 (K/uL)   RBC 3.80 (*) 4.22 - 5.81 (MIL/uL)   Hemoglobin 10.3 (*) 13.0 - 17.0 (g/dL)   HCT 09.6 (*) 04.5 - 52.0 (%)   MCV 81.1  78.0 - 100.0 (fL)   MCH 27.1  26.0 - 34.0 (pg)   MCHC 33.4  30.0 - 36.0 (g/dL)   RDW 40.9  81.1 - 91.4 (%)   Platelets 110 (*) 150 - 400 (K/uL)  GLUCOSE, CAPILLARY     Status: Abnormal   Collection Time   10/18/11  6:41 AM      Component Value Range   Glucose-Capillary 227 (*) 70 - 99 (mg/dL)  GLUCOSE, CAPILLARY     Status: Abnormal   Collection Time   10/18/11 12:25 PM      Component Value Range   Glucose-Capillary 404 (*) 70 - 99 (mg/dL)   Comment 1  Documented in Chart     Comment 2 Notify RN       Micro Results: Recent Results (from the past 240 hour(s))  WOUND CULTURE     Status: Normal (Preliminary result)   Collection Time   10/14/11 10:29 PM      Component Value Range Status Comment   Specimen Description WOUND RIGHT POSTERIOR FOOT   Final    Special Requests NONE   Final    Gram Stain     Final    Value: NO WBC SEEN     NO SQUAMOUS EPITHELIAL CELLS SEEN     RARE GRAM NEGATIVE RODS   Culture ABUNDANT PSEUDOMONAS AERUGINOSA   Final    Report Status PENDING   Incomplete    Organism ID, Bacteria PSEUDOMONAS AERUGINOSA   Final   MRSA PCR SCREENING     Status: Normal   Collection Time   10/15/11  7:07 AM      Component Value Range Status Comment   MRSA by PCR NEGATIVE  NEGATIVE  Final   SURGICAL PCR SCREEN     Status: Normal   Collection Time   10/17/11  7:51 PM      Component Value Range Status Comment   MRSA, PCR NEGATIVE  NEGATIVE  Final    Staphylococcus aureus NEGATIVE  NEGATIVE  Final   CULTURE, ROUTINE-ABSCESS     Status: Normal (Preliminary result)   Collection Time   10/17/11  9:30 PM      Component Value Range Status Comment   Specimen Description ABSCESS FOOT RIGHT   Final    Special Requests NONE   Final    Gram Stain     Final    Value: ABUNDANT WBC PRESENT,BOTH PMN AND MONONUCLEAR     RARE SQUAMOUS EPITHELIAL CELLS PRESENT     FEW GRAM POSITIVE COCCI IN  PAIRS     RARE GRAM NEGATIVE RODS   Culture Culture reincubated for better growth   Final    Report Status PENDING   Incomplete   ANAEROBIC CULTURE     Status: Normal (Preliminary result)   Collection Time   10/17/11  9:30 PM      Component Value Range Status Comment   Specimen Description ABSCESS FOOT RIGHT   Final    Special Requests NONE   Final    Gram Stain     Final    Value: ABUNDANT WBC PRESENT,BOTH PMN AND MONONUCLEAR     RARE SQUAMOUS EPITHELIAL CELLS PRESENT     FEW GRAM POSITIVE COCCI IN PAIRS     RARE GRAM NEGATIVE RODS   Culture      Final    Value: NO ANAEROBES ISOLATED; CULTURE IN PROGRESS FOR 5 DAYS   Report Status PENDING   Incomplete     Studies/Results: Ct Angio Chest W/cm &/or Wo Cm  10/15/2011  *RADIOLOGY REPORT*  Clinical Data:  Chest pain with shortness of breath and elevated D- dimer levels.  Diabetes.  Question pulmonary embolism.  CT ANGIOGRAPHY CHEST WITH CONTRAST  Technique:  Multidetector CT imaging of the chest was performed using the standard protocol during bolus administration of intravenous contrast.  Multiplanar CT image reconstructions including MIPs were obtained to evaluate the vascular anatomy.  Contrast: OMNIPAQUE IOHEXOL 300 MG/ML IV SOLN  Comparison:  None.  Findings:  The pulmonary arteries are well opacified with contrast. There is no evidence of acute pulmonary embolism.  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  The heart is mildly enlarged.  No enlarged mediastinal or hilar lymph nodes are demonstrated.  A pretracheal node demonstrates a fatty hilum.  There is no pleural or pericardial effusion.  The lungs are clear.  Images through the upper abdomen demonstrate contour irregularity of the liver suspicious for cirrhosis.  The spleen appears mildly enlarged.  There are multiple small calcified gallstones.  Review of the MIP images confirms the above findings.  IMPRESSION:  1.  No evidence of acute pulmonary embolism or other acute chest process. 2.  Cholelithiasis. 3.  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  Correlate clinically. 4.  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  Original Report Authenticated By: Gerrianne Scale, M.D.   Mr Foot Right W Wo Contrast  10/15/2011  *RADIOLOGY REPORT*  Clinical Data: Infected diabetic foot ulcer.  MRI OF THE RIGHT FOREFOOT WITHOUT AND WITH CONTRAST  Technique:  Multiplanar, multisequence MR imaging was performed both before and after administration of intravenous contrast.  Contrast: 20mL MULTIHANCE GADOBENATE DIMEGLUMINE 529 MG/ML IV  SOLN  Comparison: Right foot radiographs.  Findings: There is a large soft tissue abscess involving the medial aspect of the forefoot it is largely between the first and second toe is extending along the dorsum of the first metatarsal.  As noted on the plain films there is extensive gas formation in the soft tissues.  No obvious changes of septic arthritis but there are findings for osteomyelitis involving the first metatarsal and first proximal phalanx.  There is associated diffuse cellulitis and myofasciitis.  No involvement of the forefoot or visualized hind foot bony structures.  IMPRESSION:  1.  Large soft tissue abscess involving the medial forefoot with gas formation. 2.  Osteomyelitis involving the first metatarsal and first proximal phalanx. 3.  Diffuse cellulitis and myofasciitis.  Original Report Authenticated By: P. Loralie Champagne, M.D.   Dg Foot 2 Views Left  10/15/2011  *RADIOLOGY REPORT*  Clinical Data: Penetrating wounds to the ball of the left foot, with worsening erythema and blistering.  LEFT FOOT - 2 VIEW  Comparison: None.  Findings: There is no evidence of fracture or dislocation.  No osseous erosions are seen to suggest osteomyelitis.  The joint spaces are preserved.  There is no evidence of talar subluxation; the subtalar joint is unremarkable in appearance.  Soft tissue swelling and defect are noted along the plantar aspect of the forefoot.  IMPRESSION:  1.  No evidence of fracture or dislocation.  No definite evidence for osteomyelitis, though if there is significant clinical concern for osteomyelitis, MRI could be considered for further evaluation. 2.  Soft tissue defect and swelling along the plantar aspect of the forefoot.  Original Report Authenticated By: Tonia Ghent, M.D.   Dg Foot 2 Views Right  10/15/2011  *RADIOLOGY REPORT*  Clinical Data: Penetrating wounds to the ball of the right foot from blisters, with diffuse erythema and pain.  RIGHT FOOT - 2 VIEW  Comparison: None.   Findings: There is no evidence of fracture or dislocation.  No osseous erosions are seen to suggest osteomyelitis.  The joint spaces are preserved.  There is no evidence of talar subluxation; the subtalar joint is unremarkable in appearance.  Significant diffuse soft tissue air is noted tracking about the first toe and dorsal to the forefoot, concerning for necrotizing fasciitis.  Underlying soft tissue defects are noted along the plantar aspect of the forefoot.  IMPRESSION:  1.  No evidence of fracture or dislocation.  No definite evidence for osteomyelitis, though if there is significant clinical concern for osteomyelitis, MRI could be considered for further evaluation. 2.  Significant diffuse soft tissue air about the first toe and dorsum of the forefoot, concerning for necrotizing fasciitis.  Findings were discussed with Earley Favor at 01:03 a.m. on 10/14/2011.  Original Report Authenticated By: Tonia Ghent, M.D.   Medications: Scheduled Meds:    . aspirin  325 mg Oral Daily  . diltiazem  120 mg Oral Daily  . diltiazem  30 mg Oral Q6H  . docusate sodium  100 mg Oral BID  . insulin aspart  0-15 Units Subcutaneous TID WC  . insulin aspart  0-5 Units Subcutaneous QHS  . insulin aspart  10 Units Subcutaneous TID WC  . insulin glargine  25 Units Subcutaneous Q0700  . metoprolol tartrate  50 mg Oral BID  . pantoprazole  40 mg Oral Q1200  . piperacillin-tazobactam (ZOSYN)  IV  3.375 g Intravenous Q8H  . rosuvastatin  20 mg Oral q1800  . senna  1 tablet Oral BID  . vancomycin  1,000 mg Intravenous Q12H  . DISCONTD: metoprolol tartrate  12.5 mg Oral BID  . DISCONTD: metoprolol tartrate  25 mg Oral BID   Continuous Infusions:    . sodium chloride 100 mL/hr at 10/18/11 0434  . heparin 1,450 Units/hr (10/18/11 1213)  . lactated ringers Stopped (10/18/11 0348)  . DISCONTD: diltiazem (CARDIZEM) infusion Stopped (10/16/11 1836)  . DISCONTD: heparin 1,450 Units/hr (10/16/11 2259)   PRN  Meds:.acetaminophen, acetaminophen, HYDROmorphone, ondansetron (ZOFRAN) IV, ondansetron, oxyCODONE, polyethylene glycol, DISCONTD: HYDROmorphone, DISCONTD: promethazine, DISCONTD: sodium chloride irrigation  Wound culture from 10/14/2011  Specimen Description  WOUND RIGHT POSTERIOR FOOT   Special Requests  NONE   Gram Stain  NO WBC SEEN NO SQUAMOUS EPITHELIAL CELLS SEEN RARE GRAM NEGATIVE RODS   Culture  ABUNDANT PSEUDOMONAS AERUGINOSA   Report Status  PENDING   Organism  ID, Bacteria  PSEUDOMONAS AERUGINOSA   Resulting Agency  [facility]    Culture & Susceptibility     PSEUDOMONAS AERUGINOSA          Antibiotic  Sensitivity  Microscan  Status      CEFEPIME  Sensitive  2  Final      Method:  MIC      CEFTAZIDIME  Sensitive  <=1  Final      Method:  MIC      CIPROFLOXACIN  Sensitive  <=0.25  Final      Method:  MIC      GENTAMICIN  Sensitive  2  Final      Method:  MIC      IMIPENEM  Sensitive  <=1  Final      Method:  MIC      TOBRAMYCIN  Sensitive  <=1  Final      Method:  MIC       Comments  PSEUDOMONAS AERUGINOSA (MIC)        ABUNDANT PSEUDOMONAS AERUGINOSA      Assessment/Plan:  LOS: 4 days   - 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 Wound cultures growing pseudomonas sensitive to cefepime, Fortaz, ciprofloxacin, gentamicin, imipenem, tobramycin - Discussed with Dr. Algis Liming (ID),? narrow antibiotics and ? Duration for disposition especially with the osteomyelitis   CAD (coronary artery disease), autologous vein bypass graft / s/p 2 stent placement: went into atrial flutter on admission was found to have elevated troponins, was  treated for NSTEMI/ Demand ischemia with IV heparin and IV Cardizem. His rate was better controlled and IV cardizem was changed to po cardizem. IV  Heparin was held for surgery and restarted after surgery. Possible DCCV in the next 1 to 2 days.  - Continue with aspirin/  metoprolol/ crestor. Placed back on heparin IV.  Atrial flutter with NSTEMI/demand ischemia: Followed by cardiology - Continue metoprolol and Cardizem by mouth, on heparin drip. DCCV prior to discharge per cardiology  Hypertension controlled  Diabetes Mellitus: uncontrolled, patient was on metformin prior to admission, he likely needs insulin outpatient -  Increase lantus to 25 units BID units, continue meal coverage and SSI.   DVT prophylaxis: On heparin drip   [name] 10/18/2011, 12:55 PM

## 2011-10-18 NOTE — Consult Note (Signed)
Inpatient Diabetes Program Recommendations  AACE/ADA: New Consensus Statement on Inpatient Glycemic Control (2009)  Target Ranges:  Prepandial:   less than 140 mg/dL      Peak postprandial:   less than 180 mg/dL (1-2 hours)      Critically ill patients:  140 - 180 mg/dL   Reason for Visit: Z6X=09.6    Spoke with patient concerning A1C=13.2.  Patient said he has difficulty at home taking care of father with Alzheimers dementia and living with his brother.  He and his brother do not get along well and he feels much anxiety most of the time.  Patient said sometimes his diabetes complications feel like his "way out".  He has taken insulin before so this is not new to him.  Gave emotional support by listening and encouraged self-care.  Patient was appreciative.   Thank you Piedad Climes RN, Diabetes Coordinator [phone]

## 2011-10-18 NOTE — Progress Notes (Signed)
Subjective:  No chest pain or SOB  Objective:  Vital Signs in the last 24 hours: Temp:  [98.6 F (37 C)-102.8 F (39.3 C)] 99.5 F (37.5 C) (12/05 0616) Pulse Rate:  [67-112] 98  (12/05 0616) Resp:  [18-20] 18  (12/05 0616) BP: (116-146)/(63-84) 144/75 mmHg (12/05 0616) SpO2:  [92 %-98 %] 92 % (12/05 0616)  Intake/Output from previous day: 12/04 0701 - 12/05 0700 In: 2840 [P.O.:240; I.V.:2600] Out: 950 [Urine:900; Blood:50]  Physical Exam: General appearance: alert, cooperative and no distress Lungs: clear to auscultation bilaterally Heart: irregularly irregular rhythm ABD soft, BS pos S/p partial amputaion r foot   Rate: 90  Rhythm: atrial flutter  Lab Results:  Basename 10/18/11 0538 10/17/11 0615  WBC 10.2 8.1  HGB 10.3* 10.4*  PLT 110* 113*    Basename 10/18/11 0538 10/16/11 0940  NA 134* 130*  K 3.1* 3.9  CL 97 93*  CO2 28 25  GLUCOSE 225* 463*  BUN 10 18  CREATININE 0.85 0.98    Basename 10/15/11 2148 10/15/11 1605  TROPONINI <0.30 2.11*   Hepatic Function Panel  Basename 10/15/11 1114  PROT 6.7  ALBUMIN 2.2*  AST 26  ALT 18  ALKPHOS 75  BILITOT 0.3  BILIDIR --  IBILI --   No results found for this basename: CHOL in the last 72 hours No results found for this basename: INR in the last 72 hours  Imaging: No results found.  Cardiac Studies:  Assessment/Plan:   Principal Problem:  *Soft tissue infection of foot, s/p Ray procedure last night. Active Problems:  NSTEMI (non-ST elevated myocardial infarction)  Diabetic foot ulcers  Atrial flutter with rapid ventricular response  CAD (coronary artery disease), autologous vein bypass graft  HTN (hypertension)  Diabetes mellitus   Plan- Increase beta blocker. Change Diltiazem to CD in am, add PPI, consider ARB, ? DCCV this adm prior to d/c. Pt would like cardiology f/u with [place] after d/c.   Corine Shelter PA-C 10/18/2011, 9:50 AM     Patient seen and examined. Agree with assessment  and plan. Will increase beta blocker.  Once stable surgically will need coumadin.    Lennette Bihari, MD, [facility] 10/18/2011 10:18 AM

## 2011-10-18 NOTE — Progress Notes (Signed)
Splint dry Pt up and about Change dressing Friday - his foot had poor perfusion - amputation sites may or may not heal

## 2011-10-19 ENCOUNTER — Encounter (HOSPITAL_COMMUNITY): Payer: Self-pay | Admitting: Orthopedic Surgery

## 2011-10-19 DIAGNOSIS — M869 Osteomyelitis, unspecified: Secondary | ICD-10-CM

## 2011-10-19 LAB — BASIC METABOLIC PANEL
CO2: 27 mEq/L (ref 19–32)
Chloride: 95 mEq/L — ABNORMAL LOW (ref 96–112)
Creatinine, Ser: 1.03 mg/dL (ref 0.50–1.35)
GFR calc Af Amer: >90 mL/min (ref >90)
Sodium: 130 mEq/L — ABNORMAL LOW (ref 135–145)

## 2011-10-19 LAB — WOUND CULTURE: Gram Stain: NONE SEEN

## 2011-10-19 LAB — HEPARIN LEVEL (UNFRACTIONATED): Heparin Unfractionated: 0.46 IU/mL (ref 0.30–0.70)

## 2011-10-19 LAB — GLUCOSE, CAPILLARY: Glucose-Capillary: 196 mg/dL — ABNORMAL HIGH (ref 70–99)

## 2011-10-19 LAB — CBC
HCT: 30.9 % — ABNORMAL LOW (ref 39.0–52.0)
HCT: 30.8 % — ABNORMAL LOW (ref 39.0–52.0)
MCV: 80.7 fL (ref 78.0–100.0)
MCV: 81.7 fL (ref 78.0–100.0)
Platelets: 114 10*3/uL — ABNORMAL LOW (ref 150–400)
Platelets: 113 10*3/uL — ABNORMAL LOW (ref 150–400)
RBC: 3.83 MIL/uL — ABNORMAL LOW (ref 4.22–5.81)
RBC: 3.77 MIL/uL — ABNORMAL LOW (ref 4.22–5.81)
RDW: 13.8 % (ref 11.5–15.5)
RDW: 13.9 % (ref 11.5–15.5)
WBC: 8.9 10*3/uL (ref 4.0–10.5)
WBC: 7.6 10*3/uL (ref 4.0–10.5)

## 2011-10-19 MED ORDER — POTASSIUM CHLORIDE CRYS ER 20 MEQ PO TBCR
40.0000 meq | EXTENDED_RELEASE_TABLET | Freq: Once | ORAL | Status: AC
  Administered 2011-10-19: 40 meq via ORAL
  Filled 2011-10-19: qty 2

## 2011-10-19 MED ORDER — INSULIN GLARGINE 100 UNIT/ML ~~LOC~~ SOLN
30.0000 [IU] | SUBCUTANEOUS | Status: DC
  Administered 2011-10-20: 30 [IU] via SUBCUTANEOUS

## 2011-10-19 NOTE — Progress Notes (Signed)
Subjective:  No chest painor SOB. No awareness of AF  Objective:  Vital Signs in the last 24 hours: Temp:  [98.3 F (36.8 C)-101.9 F (38.8 C)] 98.3 F (36.8 C) (12/06 0528) Pulse Rate:  [73-98] 79  (12/06 0528) Resp:  [20] 20  (12/06 0528) BP: (102-145)/(62-78) 120/72 mmHg (12/06 0528) SpO2:  [90 %] 90 % (12/06 0528) Weight:  [84.46 kg (186 lb 3.2 oz)] 186 lb 3.2 oz (84.46 kg) (12/06 0528)  Intake/Output from previous day:  Intake/Output Summary (Last 24 hours) at 10/19/11 0940 Last data filed at 10/19/11 0533  Gross per 24 hour  Intake 2899.45 ml  Output   1450 ml  Net 1449.45 ml    Physical Exam: General appearance: alert, cooperative and no distress Lungs: clear to auscultation bilaterally Heart: irregularly irregular rhythm   Rate: 90  Rhythm: atrial fibrillation  Lab Results:  Basename 10/19/11 0615 10/19/11 0032  WBC 7.6 8.9  HGB 9.9* 10.2*  PLT 113* 114*    Basename 10/19/11 0032 10/18/11 0538  NA 130* 134*  K 3.4* 3.1*  CL 95* 97  CO2 27 28  GLUCOSE 275* 225*  BUN 10 10  CREATININE 1.03 0.85   No results found for this basename: TROPONINI:2,CK,MB:2 in the last 72 hours Hepatic Function Panel No results found for this basename: PROT,ALBUMIN,AST,ALT,ALKPHOS,BILITOT,BILIDIR,IBILI in the last 72 hours No results found for this basename: CHOL in the last 72 hours No results found for this basename: INR in the last 72 hours  Imaging: No results found.  Cardiac Studies:  Assessment/Plan:   Principal Problem:  *Soft tissue infection of foot, S/P Ray procedure this adm  Active Problems:  NSTEMI (non-ST elevated myocardial infarction), medical therapy for now  Diabetic foot ulcers  Atrial flutter with rapid ventricular response, unknown duration, present on adm.  CAD (coronary artery disease), autologous vein bypass graft  HTN (hypertension)  Diabetes mellitus  Plan- Start oral anticoagulation when OK with surgery. On Metoprolol and Cardizem  for rate control, these may need to be titrated up, will see how he does after this am's meds. Keep K+ close to 4.0     [employer] PA-C 10/19/2011, 9:40 AM  Agree with above No edema,lungs clear ,irr rhythm  Need to start coumadin of AF. Await ok from surgery.  Med. Treatment for non stMI

## 2011-10-19 NOTE — Progress Notes (Signed)
Subjective:  Still spiking low-grade fevers   Objective: Blood pressure 111/66, pulse 70, temperature 99.6 F (37.6 C), temperature source Oral, resp. rate 19, height 5\' 7"  (1.702 m), weight 84.46 kg (186 lb 3.2 oz), SpO2 90.00%. Weight change:   Intake/Output Summary (Last 24 hours) at 10/19/11 1540 Last data filed at 10/19/11 1300  Gross per 24 hour  Intake 3179.45 ml  Output   1850 ml  Net 1329.45 ml   On Exam alert, low grade temp, comfortable.   Gen: Alert and oriented x3 not in any distress Heart:S1S2 Irreg, irreg.  Lungs:CTAB, no wheezing or rhonchi. ABD: soft non tender , bowel sounds present. Extremities. Right foot dressing intact  Neuro: Alert and oriented x3 no focal neurological deficits noted   Lab Results: Results for orders placed during the hospital encounter of 10/14/11 (from the past 24 hour(s))  HEPARIN LEVEL (UNFRACTIONATED)     Status: Abnormal   Collection Time   10/18/11  3:46 PM      Component Value Range   Heparin Unfractionated 0.14 (*) 0.30 - 0.70 (IU/mL)  GLUCOSE, CAPILLARY     Status: Abnormal   Collection Time   10/18/11  4:05 PM      Component Value Range   Glucose-Capillary 234 (*) 70 - 99 (mg/dL)   Comment 1 Documented in Chart     Comment 2 Notify RN    GLUCOSE, CAPILLARY     Status: Abnormal   Collection Time   10/18/11  9:58 PM      Component Value Range   Glucose-Capillary 295 (*) 70 - 99 (mg/dL)   Comment 1 Notify RN     Comment 2 Documented in Chart    HEPARIN LEVEL (UNFRACTIONATED)     Status: Normal   Collection Time   10/19/11 12:32 AM      Component Value Range   Heparin Unfractionated 0.46  0.30 - 0.70 (IU/mL)  BASIC METABOLIC PANEL     Status: Abnormal   Collection Time   10/19/11 12:32 AM      Component Value Range   Sodium 130 (*) 135 - 145 (mEq/L)   Potassium 3.4 (*) 3.5 - 5.1 (mEq/L)   Chloride 95 (*) 96 - 112 (mEq/L)   CO2 27  19 - 32 (mEq/L)   Glucose, Bld 275 (*) 70 - 99 (mg/dL)   BUN 10  6 - 23 (mg/dL)   Creatinine, Ser 1.61  0.50 - 1.35 (mg/dL)   Calcium 7.6 (*) 8.4 - 10.5 (mg/dL)   GFR calc non Af Amer 79 (*) >90 (mL/min)   GFR calc Af Amer >90  >90 (mL/min)  CBC     Status: Abnormal   Collection Time   10/19/11 12:32 AM      Component Value Range   WBC 8.9  4.0 - 10.5 (K/uL)   RBC 3.83 (*) 4.22 - 5.81 (MIL/uL)   Hemoglobin 10.2 (*) 13.0 - 17.0 (g/dL)   HCT 09.6 (*) 04.5 - 52.0 (%)   MCV 80.7  78.0 - 100.0 (fL)   MCH 26.6  26.0 - 34.0 (pg)   MCHC 33.0  30.0 - 36.0 (g/dL)   RDW 40.9  81.1 - 91.4 (%)   Platelets 114 (*) 150 - 400 (K/uL)  GLUCOSE, CAPILLARY     Status: Abnormal   Collection Time   10/19/11 12:34 AM      Component Value Range   Glucose-Capillary 280 (*) 70 - 99 (mg/dL)  CBC     Status: Abnormal  Collection Time   10/19/11  6:15 AM      Component Value Range   WBC 7.6  4.0 - 10.5 (K/uL)   RBC 3.77 (*) 4.22 - 5.81 (MIL/uL)   Hemoglobin 9.9 (*) 13.0 - 17.0 (g/dL)   HCT 16.1 (*) 09.6 - 52.0 (%)   MCV 81.7  78.0 - 100.0 (fL)   MCH 26.3  26.0 - 34.0 (pg)   MCHC 32.1  30.0 - 36.0 (g/dL)   RDW 04.5  40.9 - 81.1 (%)   Platelets 113 (*) 150 - 400 (K/uL)  GLUCOSE, CAPILLARY     Status: Abnormal   Collection Time   10/19/11  6:27 AM      Component Value Range   Glucose-Capillary 241 (*) 70 - 99 (mg/dL)  HEPARIN LEVEL (UNFRACTIONATED)     Status: Normal   Collection Time   10/19/11  9:15 AM      Component Value Range   Heparin Unfractionated 0.46  0.30 - 0.70 (IU/mL)  GLUCOSE, CAPILLARY     Status: Abnormal   Collection Time   10/19/11 10:59 AM      Component Value Range   Glucose-Capillary 219 (*) 70 - 99 (mg/dL)     Micro Results: Recent Results (from the past 240 hour(s))  WOUND CULTURE     Status: Normal   Collection Time   10/14/11 10:29 PM      Component Value Range Status Comment   Specimen Description WOUND RIGHT POSTERIOR FOOT   Final    Special Requests NONE   Final    Gram Stain     Final    Value: NO WBC SEEN     NO SQUAMOUS EPITHELIAL CELLS SEEN      RARE GRAM NEGATIVE RODS   Culture ABUNDANT PSEUDOMONAS AERUGINOSA   Final    Report Status 10/19/2011 FINAL   Final    Organism ID, Bacteria PSEUDOMONAS AERUGINOSA   Final   MRSA PCR SCREENING     Status: Normal   Collection Time   10/15/11  7:07 AM      Component Value Range Status Comment   MRSA by PCR NEGATIVE  NEGATIVE  Final   SURGICAL PCR SCREEN     Status: Normal   Collection Time   10/17/11  7:51 PM      Component Value Range Status Comment   MRSA, PCR NEGATIVE  NEGATIVE  Final    Staphylococcus aureus NEGATIVE  NEGATIVE  Final   CULTURE, ROUTINE-ABSCESS     Status: Normal (Preliminary result)   Collection Time   10/17/11  9:30 PM      Component Value Range Status Comment   Specimen Description ABSCESS FOOT RIGHT   Final    Special Requests NONE   Final    Gram Stain     Final    Value: ABUNDANT WBC PRESENT,BOTH PMN AND MONONUCLEAR     RARE SQUAMOUS EPITHELIAL CELLS PRESENT     FEW GRAM POSITIVE COCCI IN PAIRS     RARE GRAM NEGATIVE RODS   Culture FEW GRAM NEGATIVE RODS   Final    Report Status PENDING   Incomplete   ANAEROBIC CULTURE     Status: Normal (Preliminary result)   Collection Time   10/17/11  9:30 PM      Component Value Range Status Comment   Specimen Description ABSCESS FOOT RIGHT   Final    Special Requests NONE   Final    Gram Stain  Final    Value: ABUNDANT WBC PRESENT,BOTH PMN AND MONONUCLEAR     RARE SQUAMOUS EPITHELIAL CELLS PRESENT     FEW GRAM POSITIVE COCCI IN PAIRS     RARE GRAM NEGATIVE RODS   Culture     Final    Value: NO ANAEROBES ISOLATED; CULTURE IN PROGRESS FOR 5 DAYS   Report Status PENDING   Incomplete     Studies/Results: Ct Angio Chest W/cm &/or Wo Cm  10/15/2011  *RADIOLOGY REPORT*  Clinical Data:  Chest pain with shortness of breath and elevated D- dimer levels.  Diabetes.  Question pulmonary embolism.  CT ANGIOGRAPHY CHEST WITH CONTRAST  Technique:  Multidetector CT imaging of the chest was performed using the standard  protocol during bolus administration of intravenous contrast.  Multiplanar CT image reconstructions including MIPs were obtained to evaluate the vascular anatomy.  Contrast: OMNIPAQUE IOHEXOL 300 MG/ML IV SOLN  Comparison:  None.  Findings:  The pulmonary arteries are well opacified with contrast. There is no evidence of acute pulmonary embolism.  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  The heart is mildly enlarged.  No enlarged mediastinal or hilar lymph nodes are demonstrated.  A pretracheal node demonstrates a fatty hilum.  There is no pleural or pericardial effusion.  The lungs are clear.  Images through the upper abdomen demonstrate contour irregularity of the liver suspicious for cirrhosis.  The spleen appears mildly enlarged.  There are multiple small calcified gallstones.  Review of the MIP images confirms the above findings.  IMPRESSION:  1.  No evidence of acute pulmonary embolism or other acute chest process. 2.  Cholelithiasis. 3.  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  Correlate clinically. 4.  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  Original Report Authenticated By: Gerrianne Scale, M.D.   Mr Foot Right W Wo Contrast  10/15/2011  *RADIOLOGY REPORT*  Clinical Data: Infected diabetic foot ulcer.  MRI OF THE RIGHT FOREFOOT WITHOUT AND WITH CONTRAST  Technique:  Multiplanar, multisequence MR imaging was performed both before and after administration of intravenous contrast.  Contrast: 20mL MULTIHANCE GADOBENATE DIMEGLUMINE 529 MG/ML IV SOLN  Comparison: Right foot radiographs.  Findings: There is a large soft tissue abscess involving the medial aspect of the forefoot it is largely between the first and second toe is extending along the dorsum of the first metatarsal.  As noted on the plain films there is extensive gas formation in the soft tissues.  No obvious changes of septic arthritis but there are findings for osteomyelitis involving the first metatarsal and first  proximal phalanx.  There is associated diffuse cellulitis and myofasciitis.  No involvement of the forefoot or visualized hind foot bony structures.  IMPRESSION:  1.  Large soft tissue abscess involving the medial forefoot with gas formation. 2.  Osteomyelitis involving the first metatarsal and first proximal phalanx. 3.  Diffuse cellulitis and myofasciitis.  Original Report Authenticated By: P. Loralie Champagne, M.D.   Dg Foot 2 Views Left  10/15/2011  *RADIOLOGY REPORT*  Clinical Data: Penetrating wounds to the ball of the left foot, with worsening erythema and blistering.  LEFT FOOT - 2 VIEW  Comparison: None.  Findings: There is no evidence of fracture or dislocation.  No osseous erosions are seen to suggest osteomyelitis.  The joint spaces are preserved.  There is no evidence of talar subluxation; the subtalar joint is unremarkable in appearance.  Soft tissue swelling and defect are noted along the plantar aspect of the forefoot.  IMPRESSION:  1.  No evidence of fracture or dislocation.  No definite evidence for osteomyelitis, though if there is significant clinical concern for osteomyelitis, MRI could be considered for further evaluation. 2.  Soft tissue defect and swelling along the plantar aspect of the forefoot.  Original Report Authenticated By: Tonia Ghent, M.D.   Dg Foot 2 Views Right  10/15/2011  *RADIOLOGY REPORT*  Clinical Data: Penetrating wounds to the ball of the right foot from blisters, with diffuse erythema and pain.  RIGHT FOOT - 2 VIEW  Comparison: None.  Findings: There is no evidence of fracture or dislocation.  No osseous erosions are seen to suggest osteomyelitis.  The joint spaces are preserved.  There is no evidence of talar subluxation; the subtalar joint is unremarkable in appearance.  Significant diffuse soft tissue air is noted tracking about the first toe and dorsal to the forefoot, concerning for necrotizing fasciitis.  Underlying soft tissue defects are noted along the  plantar aspect of the forefoot.  IMPRESSION:  1.  No evidence of fracture or dislocation.  No definite evidence for osteomyelitis, though if there is significant clinical concern for osteomyelitis, MRI could be considered for further evaluation. 2.  Significant diffuse soft tissue air about the first toe and dorsum of the forefoot, concerning for necrotizing fasciitis.  Findings were discussed with Earley Favor at 01:03 a.m. on 10/14/2011.  Original Report Authenticated By: Tonia Ghent, M.D.   Medications: Scheduled Meds:    . aspirin  325 mg Oral Daily  . bd getting started take home kit  1 kit Other Once  . diltiazem  120 mg Oral Daily  . diltiazem  30 mg Oral Q6H  . docusate sodium  100 mg Oral BID  . insulin aspart  0-15 Units Subcutaneous TID WC  . insulin aspart  0-5 Units Subcutaneous QHS  . insulin aspart  10 Units Subcutaneous TID WC  . insulin glargine  25 Units Subcutaneous Q0700  . metoprolol tartrate  50 mg Oral BID  . pantoprazole  40 mg Oral Q1200  . piperacillin-tazobactam (ZOSYN)  IV  3.375 g Intravenous Q8H  . potassium chloride  40 mEq Oral Once  . rosuvastatin  20 mg Oral q1800  . senna  1 tablet Oral BID  . vancomycin  1,000 mg Intravenous Q12H   Continuous Infusions:    . sodium chloride 100 mL/hr at 10/18/11 1706  . heparin 1,600 Units/hr (10/19/11 0302)  . lactated ringers Stopped (10/18/11 0348)  . DISCONTD: heparin 1,450 Units/hr (10/18/11 1213)   PRN Meds:.acetaminophen, acetaminophen, HYDROmorphone, ondansetron (ZOFRAN) IV, ondansetron, oxyCODONE, polyethylene glycol  Wound culture from 10/14/2011  Specimen Description  WOUND RIGHT POSTERIOR FOOT   Special Requests  NONE   Gram Stain  NO WBC SEEN NO SQUAMOUS EPITHELIAL CELLS SEEN RARE GRAM NEGATIVE RODS   Culture  ABUNDANT PSEUDOMONAS AERUGINOSA   Report Status  PENDING   Organism ID, Bacteria  PSEUDOMONAS AERUGINOSA   Resulting Agency  [facility]    Culture & Susceptibility     PSEUDOMONAS  AERUGINOSA          Antibiotic  Sensitivity  Microscan  Status      CEFEPIME  Sensitive  2  Final      Method:  MIC      CEFTAZIDIME  Sensitive  <=1  Final      Method:  MIC      CIPROFLOXACIN  Sensitive  <=0.25  Final      Method:  MIC  GENTAMICIN  Sensitive  2  Final      Method:  MIC      IMIPENEM  Sensitive  <=1  Final      Method:  MIC      TOBRAMYCIN  Sensitive  <=1  Final      Method:  MIC       Comments  PSEUDOMONAS AERUGINOSA (MIC)        ABUNDANT PSEUDOMONAS AERUGINOSA      Assessment/Plan:  - 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 Wound cultures growing pseudomonas but other organisms including staph, MRSA still possible - Appreciate ID recommendations, discussed with Dr. Algis Liming (ID), origin ESR CRP, will obtain MRI of the right foot to assess if there is any residual osteomyelitis and if patient may need more aggressive surgery.  CAD (coronary artery disease), autologous vein bypass graft / s/p 2 stent placement: went into atrial flutter on admission was found to have elevated troponins, was  treated for NSTEMI/ Demand ischemia with IV heparin and IV Cardizem. His rate was better controlled and IV cardizem was changed to po cardizem. IV  Heparin was held for surgery and restarted after surgery. Possible DCCV in the next 1 to 2 days.  - Continue with aspirin/ metoprolol/ crestor. Placed back on heparin IV, to start Coumadin when okay with surgery  Atrial flutter with NSTEMI/demand ischemia: Followed by cardiology - Continue metoprolol and Cardizem by mouth, on heparin drip. DCCV prior to discharge per cardiology  Hypertension controlled  Diabetes Mellitus: uncontrolled, patient was on metformin prior to admission, he likely needs insulin outpatient -  Increase Lantus to 30 units, continue meal coverage and SSI.   DVT prophylaxis: On heparin  drip   [name] 10/19/2011, 3:40 PM

## 2011-10-19 NOTE — Consult Note (Signed)
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 Isidoro Donning   HPI: [name] is an 55 y.o. male with hx of  3v CABG then stents x2 complicated by "staph infection" of the harvest site left leg, DM, atrial flutter who has had calluses on bottoms of both feet. He developed sudden pain with drainage of material from ulcer on right foot with subjective fevers. Tried to see PCP but could not be seen, came to ED where plain films showed air around 1st two concerning for severe soft tissue infection. He had wound culture obtained which grew a sensitive pseudmonas. He was placed on vancomycin and zosyn. He was taken to the OR on teh 4th by Dr. August Saucer and underwent I and D of abscess and ray amputation of digits 1 and 2. He has continued to spike fevers postoperatively and Dr. August Saucer indicated concern for healing at the amputation site.     Past Medical History  Diagnosis Date  . CAD (coronary artery disease), autologous vein bypass graft     s/p CABG in 80's or 90's, then cath x2, last in the 90's with DES and BMS placed but not sure where. Dr. Lupita Shutter at [facility]   . HTN (hypertension)   . 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 Diabetes mellitus   . Anxiety   . Headache   . Atrial flutter with rapid ventricular response 10/15/2011  . NSTEMI (non-ST elevated myocardial infarction) 10/17/2011    Past Surgical History  Procedure Date  . Coronary artery bypass graft   . No past surgeries   . Amputation 10/17/2011    Procedure: AMPUTATION RAY;  Surgeon: Cammy Copa;  Location: [facility] OR;  Service: Orthopedics;  Laterality: Right;  First and Second Ray Amputation  ergies:   No Known Allergies   Medications: I have reviewed patients current medications as documented in Epic Anti-infectives     Start     Dose/Rate Route  Frequency Ordered Stop   10/15/11 1000   vancomycin (VANCOCIN) IVPB 1000 mg/200 mL premix        1,000 mg 200 mL/hr over 60 Minutes Intravenous Every 12 hours 10/15/11 0715     10/15/11 0800  piperacillin-tazobactam (ZOSYN) IVPB 3.375 g       3.375 g 12.5 mL/hr over 240 Minutes Intravenous 3 times per day 10/15/11 0715     10/14/11 2300   vancomycin (VANCOCIN) IVPB 1000 mg/200 mL premix        1,000 mg 200 mL/hr over 60 Minutes Intravenous  Once 10/14/11 2256 10/15/11 0104          Social History:  reports that he quit smoking about 3 months ago. His smoking use included Cigarettes. He has a 17.5 pack-year smoking history. He does not have any smokeless tobacco history on file. He reports that he does not drink alcohol or use illicit drugs.  Family History  Problem Relation Age of Onset  . Alzheimer's disease Father   . Melanoma Father   . Benign prostatic hyperplasia Father   . Coronary artery disease Father     s/p CABG  . Heart attack Brother   . Coronary artery disease Brother     s/p CABG  . Lymphoma Sister     non Hodgkin's   . Aortic aneurysm Mother   . Anemia  Mother     As in HPI and primary teams notes otherwise 12 point review of systems is negative  Blood pressure 136/74, pulse 103, temperature 98.3 F (36.8 C), temperature source Oral, resp. rate 20, height 5\' 7"  (1.702 m), weight 186 lb 3.2 oz (84.46 kg), SpO2 90.00%. General: Alert and awake, oriented x3, not in any acute distress. HEENT: anicteric sclera, pupils reactive to light and accommodation, EOMI, oropharynx clear and without exudate CVS regular rate, normal r,  no murmur rubs or gallops Chest: clear to auscultation bilaterally, no wheezing, rales or rhonchi Abdomen: soft nontender, nondistended, normal bowel sounds, Extremities: left foot with ulceration on dorsum of distal foot, left foot wrapped in large bandage Skin: venous stasis changes bilaterally see ext Neuro: nonfocal, strength and  sensation diminished in extremies   Results for orders placed during the hospital encounter of 10/14/11 (from the past 48 hour(s))  GLUCOSE, CAPILLARY     Status: Abnormal   Collection Time   10/17/11 12:12 PM      Component Value Range Comment   Glucose-Capillary 256 (*) 70 - 99 (mg/dL)    Comment 1 Notify RN      Comment 2 Documented in Chart     GLUCOSE, CAPILLARY     Status: Abnormal   Collection Time   10/17/11  3:07 PM      Component Value Range Comment   Glucose-Capillary 160 (*) 70 - 99 (mg/dL)   GLUCOSE, CAPILLARY     Status: Abnormal   Collection Time   10/17/11  4:27 PM      Component Value Range Comment   Glucose-Capillary 162 (*) 70 - 99 (mg/dL)   GLUCOSE, CAPILLARY     Status: Abnormal   Collection Time   10/17/11  7:30 PM      Component Value Range Comment   Glucose-Capillary 207 (*) 70 - 99 (mg/dL)   SURGICAL PCR SCREEN     Status: Normal   Collection Time   10/17/11  7:51 PM      Component Value Range Comment   MRSA, PCR NEGATIVE  NEGATIVE     Staphylococcus aureus NEGATIVE  NEGATIVE    CULTURE, ROUTINE-ABSCESS     Status: Normal (Preliminary result)   Collection Time   10/17/11  9:30 PM      Component Value Range Comment   Specimen Description ABSCESS FOOT RIGHT      Special Requests NONE      Gram Stain        Value: ABUNDANT WBC PRESENT,BOTH PMN AND MONONUCLEAR     RARE SQUAMOUS EPITHELIAL CELLS PRESENT     FEW GRAM POSITIVE COCCI IN PAIRS     RARE GRAM NEGATIVE RODS   Culture FEW GRAM NEGATIVE RODS      Report Status PENDING     ANAEROBIC CULTURE     Status: Normal (Preliminary result)   Collection Time   10/17/11  9:30 PM      Component Value Range Comment   Specimen Description ABSCESS FOOT RIGHT      Special Requests NONE      Gram Stain        Value: ABUNDANT WBC PRESENT,BOTH PMN AND MONONUCLEAR     RARE SQUAMOUS EPITHELIAL CELLS PRESENT     FEW GRAM POSITIVE COCCI IN PAIRS     RARE GRAM NEGATIVE RODS   Culture        Value: NO ANAEROBES  ISOLATED; CULTURE IN PROGRESS FOR 5 DAYS   Report Status  PENDING     GLUCOSE, CAPILLARY     Status: Abnormal   Collection Time   10/17/11 10:45 PM      Component Value Range Comment   Glucose-Capillary 207 (*) 70 - 99 (mg/dL)   BASIC METABOLIC PANEL     Status: Abnormal   Collection Time   10/18/11  5:38 AM      Component Value Range Comment   Sodium 134 (*) 135 - 145 (mEq/L)    Potassium 3.1 (*) 3.5 - 5.1 (mEq/L) DELTA CHECK NOTED   Chloride 97  96 - 112 (mEq/L)    CO2 28  19 - 32 (mEq/L)    Glucose, Bld 225 (*) 70 - 99 (mg/dL)    BUN 10  6 - 23 (mg/dL)    Creatinine, Ser 1.61  0.50 - 1.35 (mg/dL)    Calcium 7.6 (*) 8.4 - 10.5 (mg/dL)    GFR calc non Af Amer >90  >90 (mL/min)    GFR calc Af Amer >90  >90 (mL/min)   CBC     Status: Abnormal   Collection Time   10/18/11  5:38 AM      Component Value Range Comment   WBC 10.2  4.0 - 10.5 (K/uL)    RBC 3.80 (*) 4.22 - 5.81 (MIL/uL)    Hemoglobin 10.3 (*) 13.0 - 17.0 (g/dL)    HCT 09.6 (*) 04.5 - 52.0 (%)    MCV 81.1  78.0 - 100.0 (fL)    MCH 27.1  26.0 - 34.0 (pg)    MCHC 33.4  30.0 - 36.0 (g/dL)    RDW 40.9  81.1 - 91.4 (%)    Platelets 110 (*) 150 - 400 (K/uL) CONSISTENT WITH PREVIOUS RESULT  GLUCOSE, CAPILLARY     Status: Abnormal   Collection Time   10/18/11  6:41 AM      Component Value Range Comment   Glucose-Capillary 227 (*) 70 - 99 (mg/dL)   GLUCOSE, CAPILLARY     Status: Abnormal   Collection Time   10/18/11 12:25 PM      Component Value Range Comment   Glucose-Capillary 404 (*) 70 - 99 (mg/dL)    Comment 1 Documented in Chart      Comment 2 Notify RN     HEPARIN LEVEL (UNFRACTIONATED)     Status: Abnormal   Collection Time   10/18/11  1:30 PM      Component Value Range Comment   Heparin Unfractionated <0.10 (*) 0.30 - 0.70 (IU/mL)   HEPARIN LEVEL (UNFRACTIONATED)     Status: Abnormal   Collection Time   10/18/11  3:46 PM      Component Value Range Comment   Heparin Unfractionated 0.14 (*) 0.30 - 0.70 (IU/mL)     GLUCOSE, CAPILLARY     Status: Abnormal   Collection Time   10/18/11  4:05 PM      Component Value Range Comment   Glucose-Capillary 234 (*) 70 - 99 (mg/dL)    Comment 1 Documented in Chart      Comment 2 Notify RN     GLUCOSE, CAPILLARY     Status: Abnormal   Collection Time   10/18/11  9:58 PM      Component Value Range Comment   Glucose-Capillary 295 (*) 70 - 99 (mg/dL)    Comment 1 Notify RN      Comment 2 Documented in Chart     HEPARIN LEVEL (UNFRACTIONATED)     Status: Normal   Collection  Time   10/19/11 12:32 AM      Component Value Range Comment   Heparin Unfractionated 0.46  0.30 - 0.70 (IU/mL)   BASIC METABOLIC PANEL     Status: Abnormal   Collection Time   10/19/11 12:32 AM      Component Value Range Comment   Sodium 130 (*) 135 - 145 (mEq/L)    Potassium 3.4 (*) 3.5 - 5.1 (mEq/L)    Chloride 95 (*) 96 - 112 (mEq/L)    CO2 27  19 - 32 (mEq/L)    Glucose, Bld 275 (*) 70 - 99 (mg/dL)    BUN 10  6 - 23 (mg/dL)    Creatinine, Ser 4.54  0.50 - 1.35 (mg/dL)    Calcium 7.6 (*) 8.4 - 10.5 (mg/dL)    GFR calc non Af Amer 79 (*) >90 (mL/min)    GFR calc Af Amer >90  >90 (mL/min)   CBC     Status: Abnormal   Collection Time   10/19/11 12:32 AM      Component Value Range Comment   WBC 8.9  4.0 - 10.5 (K/uL)    RBC 3.83 (*) 4.22 - 5.81 (MIL/uL)    Hemoglobin 10.2 (*) 13.0 - 17.0 (g/dL)    HCT 09.8 (*) 11.9 - 52.0 (%)    MCV 80.7  78.0 - 100.0 (fL)    MCH 26.6  26.0 - 34.0 (pg)    MCHC 33.0  30.0 - 36.0 (g/dL)    RDW 14.7  82.9 - 56.2 (%)    Platelets 114 (*) 150 - 400 (K/uL) CONSISTENT WITH PREVIOUS RESULT  GLUCOSE, CAPILLARY     Status: Abnormal   Collection Time   10/19/11 12:34 AM      Component Value Range Comment   Glucose-Capillary 280 (*) 70 - 99 (mg/dL)   CBC     Status: Abnormal   Collection Time   10/19/11  6:15 AM      Component Value Range Comment   WBC 7.6  4.0 - 10.5 (K/uL)    RBC 3.77 (*) 4.22 - 5.81 (MIL/uL)    Hemoglobin 9.9 (*) 13.0 - 17.0 (g/dL)     HCT 13.0 (*) 86.5 - 52.0 (%)    MCV 81.7  78.0 - 100.0 (fL)    MCH 26.3  26.0 - 34.0 (pg)    MCHC 32.1  30.0 - 36.0 (g/dL)    RDW 78.4  69.6 - 29.5 (%)    Platelets 113 (*) 150 - 400 (K/uL) CONSISTENT WITH PREVIOUS RESULT  GLUCOSE, CAPILLARY     Status: Abnormal   Collection Time   10/19/11  6:27 AM      Component Value Range Comment   Glucose-Capillary 241 (*) 70 - 99 (mg/dL)   HEPARIN LEVEL (UNFRACTIONATED)     Status: Normal   Collection Time   10/19/11  9:15 AM      Component Value Range Comment   Heparin Unfractionated 0.46  0.30 - 0.70 (IU/mL)       Component Value Date/Time   SDES ABSCESS FOOT RIGHT 10/17/2011 2130   SDES ABSCESS FOOT RIGHT 10/17/2011 2130   SPECREQUEST NONE 10/17/2011 2130   SPECREQUEST NONE 10/17/2011 2130   CULT FEW GRAM NEGATIVE RODS 10/17/2011 2130   CULT NO ANAEROBES ISOLATED; CULTURE IN PROGRESS FOR 5 DAYS 10/17/2011 2130   REPTSTATUS PENDING 10/17/2011 2130   REPTSTATUS PENDING 10/17/2011 2130   No results found.   Recent Results (from the past 720 hour(s))  WOUND CULTURE     Status: Normal   Collection Time   10/14/11 10:29 PM      Component Value Range Status Comment   Specimen Description WOUND RIGHT POSTERIOR FOOT   Final    Special Requests NONE   Final    Gram Stain     Final    Value: NO WBC SEEN     NO SQUAMOUS EPITHELIAL CELLS SEEN     RARE GRAM NEGATIVE RODS   Culture ABUNDANT PSEUDOMONAS AERUGINOSA   Final    Report Status 10/19/2011 FINAL   Final    Organism ID, Bacteria PSEUDOMONAS AERUGINOSA   Final   MRSA PCR SCREENING     Status: Normal   Collection Time   10/15/11  7:07 AM      Component Value Range Status Comment   MRSA by PCR NEGATIVE  NEGATIVE  Final   SURGICAL PCR SCREEN     Status: Normal   Collection Time   10/17/11  7:51 PM      Component Value Range Status Comment   MRSA, PCR NEGATIVE  NEGATIVE  Final    Staphylococcus aureus NEGATIVE  NEGATIVE  Final   CULTURE, ROUTINE-ABSCESS     Status: Normal (Preliminary  result)   Collection Time   10/17/11  9:30 PM      Component Value Range Status Comment   Specimen Description ABSCESS FOOT RIGHT   Final    Special Requests NONE   Final    Gram Stain     Final    Value: ABUNDANT WBC PRESENT,BOTH PMN AND MONONUCLEAR     RARE SQUAMOUS EPITHELIAL CELLS PRESENT     FEW GRAM POSITIVE COCCI IN PAIRS     RARE GRAM NEGATIVE RODS   Culture FEW GRAM NEGATIVE RODS   Final    Report Status PENDING   Incomplete   ANAEROBIC CULTURE     Status: Normal (Preliminary result)   Collection Time   10/17/11  9:30 PM      Component Value Range Status Comment   Specimen Description ABSCESS FOOT RIGHT   Final    Special Requests NONE   Final    Gram Stain     Final    Value: ABUNDANT WBC PRESENT,BOTH PMN AND MONONUCLEAR     RARE SQUAMOUS EPITHELIAL CELLS PRESENT     FEW GRAM POSITIVE COCCI IN PAIRS     RARE GRAM NEGATIVE RODS   Culture     Final    Value: NO ANAEROBES ISOLATED; CULTURE IN PROGRESS FOR 5 DAYS   Report Status PENDING   Incomplete      Impression/Recommendation 55 year old with DM, hx of prior Staph infection 20 yrs ago at harvest site now with bilateral ulcers feet, with right one progressed to osteo and severe infection sp amputation   1) Osteomyelitis: Differential includes not just the pseudomonas isolated on wound culture but certainly other organisms including staph, MRSA, GAS< GBS, anaerobes. I am concerned by his persistent fevers that he may need more aggressive surgery such as midfoot or higher amputation. --continue vancomycin and zosyn for now while in house,  --close monitoring of site --consider MRI of this foot --check esr and crp --If and when he gets "out of the woods" from this infection, it is not clear how comfortable he would be with home IV antibiotics  2) Screening: Low risk for HIV, but will send with am labs along with hep panel   Thank you so much  for this interesting consult,   Acey Lav 10/19/2011, 10:46 AM    [phone] (pager) [phone] (office)

## 2011-10-19 NOTE — Progress Notes (Signed)
Physical Therapy Cancellation Patient Details Name: Atanacio Melnyk MRN: [id number] DOB: 10/06/1956 Today's Date: 10/19/2011  PT Eval cancelled today secondary to pt having bedrest orders and awaiting a post-op shoe for R foot.  Communication with nurse and MD, bedrest orders lifted, and order for post-op shoe submitted.  Will attempt this PM if time allows.  Thanks!!   Lacinda Axon 10/19/2011, 3:33 PM  Jake Shark, PT DPT [phone]

## 2011-10-19 NOTE — Progress Notes (Signed)
INITIAL ADULT NUTRITION ASSESSMENT Date: 10/19/2011   Time: 12:45 PM Reason for Assessment: Health Hx- wt loss  ASSESSMENT: Male 55 y.o.  Dx: Soft tissue infection of foot  Past Medical History  Diagnosis Date  . CAD (coronary artery disease), autologous vein bypass graft     s/p CABG in 80's or 90's, then cath x2, last in the 90's with DES and BMS placed but not sure where. Dr. Lupita Shutter at [facility]   . HTN (hypertension)   . 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 Diabetes mellitus   . Anxiety   . Headache   . Atrial flutter with rapid ventricular response 10/15/2011  . NSTEMI (non-ST elevated myocardial infarction) 10/17/2011    Scheduled Meds:    . aspirin  325 mg Oral Daily  . bd getting started take home kit  1 kit Other Once  . diltiazem  120 mg Oral Daily  . diltiazem  30 mg Oral Q6H  . docusate sodium  100 mg Oral BID  . insulin aspart  0-15 Units Subcutaneous TID WC  . insulin aspart  0-5 Units Subcutaneous QHS  . insulin aspart  10 Units Subcutaneous TID WC  . insulin glargine  25 Units Subcutaneous Q0700  . metoprolol tartrate  50 mg Oral BID  . pantoprazole  40 mg Oral Q1200  . piperacillin-tazobactam (ZOSYN)  IV  3.375 g Intravenous Q8H  . potassium chloride  40 mEq Oral Once  . rosuvastatin  20 mg Oral q1800  . senna  1 tablet Oral BID  . vancomycin  1,000 mg Intravenous Q12H   Continuous Infusions:    . sodium chloride 100 mL/hr at 10/18/11 1706  . heparin 1,600 Units/hr (10/19/11 0302)  . lactated ringers Stopped (10/18/11 0348)  . DISCONTD: heparin 1,450 Units/hr (10/18/11 1213)   PRN Meds:.acetaminophen, acetaminophen, HYDROmorphone, ondansetron (ZOFRAN) IV, ondansetron, oxyCODONE, polyethylene glycol   Ht: 5\' 7"  (170.2 cm)  Wt: 186 lb 3.2 oz (84.46 kg) (scale c)  Ideal Wt:  148 lb (67.3 kg) % Ideal Wt: 126%  Body mass index is 29.16 kg/(m^2).  Food/Nutrition Related  Hx:  First and second toe amputation on right foot on 10/17/11. Diabetic ulcer also on left foot. Pt reports unintentional 175 lb weight loss in past year, (48%wt loss, severe wt loss). Pt states he gets depressed due to situation at home (mother passed away, caring for father with Alzheimer's, not getting along with brother) and when he is depressed he does not eat. Pt reports he knows how to eat with DM. Pt states no one in the house cooks very often since mother passed so mostly eat outside of home. Pt states he does not like supplement beverages.  Labs:  CMP     Component Value Date/Time   NA 130* 10/19/2011 0032   K 3.4* 10/19/2011 0032   CL 95* 10/19/2011 0032   CO2 27 10/19/2011 0032   GLUCOSE 275* 10/19/2011 0032   BUN 10 10/19/2011 0032   CREATININE 1.03 10/19/2011 0032   CALCIUM 7.6* 10/19/2011 0032   PROT 6.7 10/15/2011 1114   ALBUMIN 2.2* 10/15/2011 1114   AST 26 10/15/2011 1114   ALT 18 10/15/2011 1114   ALKPHOS 75 10/15/2011 1114   BILITOT 0.3 10/15/2011 1114   GFRNONAA 79* 10/19/2011 0032   GFRAA >90 10/19/2011 0032    CBG (last 3)   Basename 10/19/11 1059 10/19/11 1610 10/19/11 0034  GLUCAP 219* 241* 280*    Lab Results  Component Value Date   HGBA1C 13.2* 10/15/2011   Lab Results  Component Value Date   LDLCALC 100* 10/15/2011   CREATININE 1.03 10/19/2011   I/O last 3 completed shifts: In: 4589.5 [P.O.:480; I.V.:3459.5; IV Piggyback:650] Out: 2400 [Urine:2350; Blood:50] Total I/O In: 240 [P.O.:240] Out: 200 [Urine:200]   Diet Order: Cardiac 100% PO intake recorded.   IVF:     sodium chloride Last Rate: 100 mL/hr at 10/18/11 1706  heparin Last Rate: 1,600 Units/hr (10/19/11 0302)  lactated ringers Last Rate: Stopped (10/18/11 0348)  DISCONTD: heparin Last Rate: 1,450 Units/hr (10/18/11 1213)    Estimated Nutritional Needs:   Kcal: 2200-2400 Protein: 90-105 gm Fluid: >2.5 L  NUTRITION DIAGNOSIS: -Malnutrition (NI-5.2).  Status: Ongoing  RELATED TO:  depression and skipping meals  AS EVIDENCE BY: 48% weight loss in past year and pt comments  MONITORING/EVALUATION(Goals): Goal: Pt to consume >75% meals to prevent further weight loss. Monitor: PO intake, weight changes, labs  EDUCATION NEEDS: -No education needs identified at this time  INTERVENTION: Encouraged PO intake and to consume high protein foods at each meal. Stressed importance of not skipping meals at home and importance of balanced nutrition in relation to wound healing.     DOCUMENTATION CODES Per approved criteria  -Severe malnutrition in the context of chronic illness    Leonette Most 10/19/2011, 12:45 PM

## 2011-10-19 NOTE — Progress Notes (Signed)
Inpatient Diabetes Program Recommendations  AACE/ADA: New Consensus Statement on Inpatient Glycemic Control (2009)  Target Ranges:  Prepandial:   less than 140 mg/dL      Peak postprandial:   less than 180 mg/dL (1-2 hours)      Critically ill patients:  140 - 180 mg/dL   Reason: Hyperglycemia  Inpatient Diabetes Program Recommendations Insulin - Basal: Increase Lantus to 30 units  Thank you Piedad Climes RN, Diabetes Coordinator [phone]

## 2011-10-19 NOTE — Progress Notes (Signed)
ANTICOAGULATION CONSULT NOTE - Follow Up Consult  Pharmacy Consult for Heparin Indication: Aflutter, elevated troponins  No Known Allergies  Patient Measurements: Height: 5\' 7"  (170.2 cm) Weight: 180 lb 1.6 oz (81.693 kg) (scale c) IBW/kg (Calculated) : 66.1  Heparin Dosing Weight: 78.5 kg  Vital Signs: Temp: 101.9 F (38.8 C) (12/05 2149) BP: 145/78 mmHg (12/06 0044) Pulse Rate: 98  (12/06 0044)  Labs:  Basename 10/19/11 0032 10/18/11 1546 10/18/11 1330 10/18/11 0538 10/17/11 0615 10/16/11 0940  HGB 10.2* -- -- 10.3* -- --  HCT 30.9* -- -- 30.8* 31.7* --  PLT 114* -- -- 110* 113* --  APTT -- -- -- -- -- --  LABPROT -- -- -- -- -- --  INR -- -- -- -- -- --  HEPARINUNFRC 0.46 0.14* <0.10* -- -- --  CREATININE 1.03 -- -- 0.85 -- 0.98  CKTOTAL -- -- -- -- -- --  CKMB -- -- -- -- -- --  TROPONINI -- -- -- -- -- --   Estimated Creatinine Clearance: 80.9 ml/min (by C-G formula based on Cr of 1.03).   Assessment: 55 y.o. M on heparin for Aflutter and elevated troponins. S/p surgery for amputation. Heparin is therapeutic. No bleeding complications are noted    Goal of Therapy:  Heparin level of 0.3-0.7   Plan:  Continue Heparin at current rate of 1600 units/hr and recheck hl at 0900    Janice Coffin 10/19/2011,3:15 AM

## 2011-10-19 NOTE — Progress Notes (Signed)
ANTICOAGULATION AND ANTIBIOTIC CONSULT NOTE - Follow Up Consult  Pharmacy Consult for Heparin; Vancomycin and Zosyn Indication: atrial fibrillation; Diabetic foot infection  No Known Allergies  Patient Measurements: Height: 5\' 7"  (170.2 cm) Weight: 186 lb 3.2 oz (84.46 kg) (scale c) IBW/kg (Calculated) : 66.1   Vital Signs: Temp: 98.3 F (36.8 C) (12/06 0528) BP: 136/74 mmHg (12/06 1015) Pulse Rate: 103  (12/06 1015)  Labs:  Alvira Philips 10/19/11 0915 10/19/11 0615 10/19/11 0032 10/18/11 1546 10/18/11 0538  HGB -- 9.9* 10.2* -- --  HCT -- 30.8* 30.9* -- 30.8*  PLT -- 113* 114* -- 110*  APTT -- -- -- -- --  LABPROT -- -- -- -- --  INR -- -- -- -- --  HEPARINUNFRC 0.46 -- 0.46 0.14* --  CREATININE -- -- 1.03 -- 0.85  CKTOTAL -- -- -- -- --  CKMB -- -- -- -- --  TROPONINI -- -- -- -- --   Estimated Creatinine Clearance: 82.3 ml/min (by C-G formula based on Cr of 1.03).   Medications:  Infusions:    . sodium chloride 100 mL/hr at 10/18/11 1706  . heparin 1,600 Units/hr (10/19/11 0302)  . lactated ringers Stopped (10/18/11 0348)  . DISCONTD: heparin 1,450 Units/hr (10/18/11 1213)   Anti-infectives     Start     Dose/Rate Route Frequency Ordered Stop   10/15/11 1000   vancomycin (VANCOCIN) IVPB 1000 mg/200 mL premix        1,000 mg 200 mL/hr over 60 Minutes Intravenous Every 12 hours 10/15/11 0715     10/15/11 0800   piperacillin-tazobactam (ZOSYN) IVPB 3.375 g        3.375 g 12.5 mL/hr over 240 Minutes Intravenous 3 times per day 10/15/11 0715     10/14/11 2300   vancomycin (VANCOCIN) IVPB 1000 mg/200 mL premix        1,000 mg 200 mL/hr over 60 Minutes Intravenous  Once 10/14/11 2256 10/15/11 0104          Assessment: Atrial Fibrillation: Remains therapeutic on Heparin.  Note plan to transition to Coumadin when OK with Surgery. Diabetic Foot Infection: On Day #5 of Vancomycin and Zosyn.  Pseudomonas isolated in Wound culture is pan-sensitive, but Abscess  culture is growing Gram-negative rods that have not yet been identified.  Goal of Therapy:  Heparin level 0.3-0.7 units/ml   Plan:  Continue Heparin at 1600 units/hr Check Heparin level and CBC in AM. Continue current Vancomycin and Zosyn doses. Anticipate checking Vancomycin trough 12/7 or 1/28 if therapy is to continue. Await ID and susceptibilities from abscess culture. If clinically appropriate, consider discontinuing Vancomycin (no gram-positive organisms isolated).  Estella Husk, Pharm.D., BCPS Clinical Pharmacist  Pager [phone] 10/19/2011, 10:52 AM

## 2011-10-20 DIAGNOSIS — M869 Osteomyelitis, unspecified: Secondary | ICD-10-CM

## 2011-10-20 LAB — CBC
HCT: 29.2 % — ABNORMAL LOW (ref 39.0–52.0)
MCHC: 32.2 g/dL (ref 30.0–36.0)
MCV: 82.5 fL (ref 78.0–100.0)
RDW: 13.9 % (ref 11.5–15.5)

## 2011-10-20 LAB — BASIC METABOLIC PANEL WITH GFR
BUN: 7 mg/dL (ref 6–23)
CO2: 32 meq/L (ref 19–32)
Calcium: 7.6 mg/dL — ABNORMAL LOW (ref 8.4–10.5)
Chloride: 98 meq/L (ref 96–112)
Creatinine, Ser: 1.1 mg/dL (ref 0.50–1.35)
GFR calc Af Amer: 84 mL/min — ABNORMAL LOW
GFR calc non Af Amer: 73 mL/min — ABNORMAL LOW
Glucose, Bld: 231 mg/dL — ABNORMAL HIGH (ref 70–99)
Potassium: 3.1 meq/L — ABNORMAL LOW (ref 3.5–5.1)
Sodium: 136 meq/L (ref 135–145)

## 2011-10-20 LAB — C-REACTIVE PROTEIN: CRP: 19.89 mg/dL — ABNORMAL HIGH (ref <0.60)

## 2011-10-20 LAB — HEPARIN LEVEL (UNFRACTIONATED): Heparin Unfractionated: 0.34 [IU]/mL (ref 0.30–0.70)

## 2011-10-20 LAB — GLUCOSE, CAPILLARY
Glucose-Capillary: 198 mg/dL — ABNORMAL HIGH (ref 70–99)
Glucose-Capillary: 244 mg/dL — ABNORMAL HIGH (ref 70–99)
Glucose-Capillary: 180 mg/dL — ABNORMAL HIGH (ref 70–99)

## 2011-10-20 MED ORDER — HEPARIN SOD (PORCINE) IN D5W 100 UNIT/ML IV SOLN
1450.0000 [IU]/h | INTRAVENOUS | Status: DC
  Administered 2011-10-20 – 2011-10-23 (×5): 1600 [IU]/h via INTRAVENOUS
  Administered 2011-10-23: 1450 [IU]/h via INTRAVENOUS
  Filled 2011-10-20 (×7): qty 250

## 2011-10-20 MED ORDER — INSULIN GLARGINE 100 UNIT/ML ~~LOC~~ SOLN
40.0000 [IU] | SUBCUTANEOUS | Status: DC
  Administered 2011-10-21 – 2011-10-23 (×3): 40 [IU] via SUBCUTANEOUS

## 2011-10-20 NOTE — Progress Notes (Signed)
Pt stable - min pain - on heparin vss Dressing changed - not terrible - has a chance to heal Reapply dry dressing - recheck Monday - continue abx either po or iv for 2 more weeks

## 2011-10-20 NOTE — Progress Notes (Signed)
Inpatient Diabetes Program Recommendations  AACE/ADA: New Consensus Statement on Inpatient Glycemic Control (2009)  Target Ranges:  Prepandial:   less than 140 mg/dL      Peak postprandial:   less than 180 mg/dL (1-2 hours)      Critically ill patients:  140 - 180 mg/dL   Reason for Visit: Note plan for patient to be discharged home on insulin.  Talked to patient.  He has been on insulin off and on for 20 years.  Currently does not have insurance.   Therefore may need cheaper insulin regimen such as NPH or Novolin 70/30 which can be purchased for 26$ a vial at [facility].  Placed consult for case management to assist patient as needed with cost/medication needs.  Patient states is is very familiar with giving insulin with vial and syringe.  Inpatient Diabetes Program Recommendations Insulin - Basal: Agree with increased Lantus today.

## 2011-10-20 NOTE — Progress Notes (Signed)
Orthopedic Tech Progress Note Patient Details:  [name] 1956-06-05 [id number]  Other Ortho Devices Type of Ortho Device: Postop boot Ortho Device Location: right foot Ortho Device Interventions: Application   Nikki Dom 10/20/2011, 9:35 AM

## 2011-10-20 NOTE — Progress Notes (Signed)
Subjective:  No chest pain no SOB  Objective:  Vital Signs in the last 24 hours: Temp:  [98 F (36.7 C)-102.3 F (39.1 C)] 98 F (36.7 C) (12/07 0432) Pulse Rate:  [70-103] 74  (12/07 0432) Resp:  [18-19] 18  (12/07 0432) BP: (111-160)/(66-79) 123/79 mmHg (12/07 0432) SpO2:  [90 %-98 %] 97 % (12/07 0432) Weight:  [84.3 kg (185 lb 13.6 oz)] 185 lb 13.6 oz (84.3 kg) (12/07 0427)  Intake/Output from previous day:  Intake/Output Summary (Last 24 hours) at 10/20/11 0825 Last data filed at 10/20/11 0700  Gross per 24 hour  Intake 4367.47 ml  Output   1675 ml  Net 2692.47 ml    Physical Exam: General appearance: alert, cooperative and no distress Lungs: clear to auscultation bilaterally Heart: irregularly irregular rhythm   Rate: 80  Rhythm: atrial fibrillation  Lab Results:  Basename 10/20/11 0615 10/19/11 0615  WBC 4.5 7.6  HGB 9.4* 9.9*  PLT 116* 113*    Basename 10/20/11 0615 10/19/11 0032  NA 136 130*  K 3.1* 3.4*  CL 98 95*  CO2 32 27  GLUCOSE 231* 275*  BUN 7 10  CREATININE 1.10 1.03   No results found for this basename: TROPONINI:2,CK,MB:2 in the last 72 hours Hepatic Function Panel No results found for this basename: PROT,ALBUMIN,AST,ALT,ALKPHOS,BILITOT,BILIDIR,IBILI in the last 72 hours No results found for this basename: CHOL in the last 72 hours No results found for this basename: INR in the last 72 hours  Imaging: No results found.  Cardiac Studies:  Assessment/Plan:   Principal Problem:  *Soft tissue infection of foot, s/p Ray procedure this adm. 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 There apparently is a  possibility he may need more surgery, MRI today.  Plan from cardiac standpoint is for medical Rx at this time for MI, CAD. Will discuss possible cardioversion attempt prior to D/C with MD.    Corine Shelter PA-C 10/20/2011, 8:25 AM

## 2011-10-20 NOTE — Progress Notes (Signed)
ANTICOAGULATION AND ANTIBIOTIC CONSULT NOTE - Follow Up Consult  Pharmacy Consult for Heparin, Vancomycin, Zosyn Indication: atrial fibrillation, Diabetic foot infection  Assessment:  Atrial Fibrillation: Remains therapeutic on Heparin (HL=0.34). No bleeding noted per patient. Note plan for medical management of MI/CAD with possible cardioversion attempt. Diabetic Foot Infection: On Day #6 of Vancomycin and Zosyn. Pseudomonas isolated in Wound culture (12/1) is pan-sensitive. Abscess culture (12/4) shows few Klebsiella oxytoca (resistant only to ampicillin) and moderate enterococcus species. Still having low-grade fevers, WBC down to 4.5. Will check vanco trough before AM dose tomorrow.  Goal of Therapy:  Heparin level 0.3-0.7 units/ml Vanco trough 15-20 mg/dL   Plan:  1. Continue IV heparin at 1600 units/hr. Check heparin level in AM. 2. Continue IV vancomycin & zosyn. Check vanco trough before AM dose.  [name] 10/20/2011,9:45 AM  No Known Allergies  Patient Measurements: Height: 5\' 7"  (170.2 cm) Weight: 185 lb 13.6 oz (84.3 kg) IBW/kg (Calculated) : 66.1  Heparin Dosing Weight: 83.1kg  Vital Signs: Temp: 98 F (36.7 C) (12/07 0432) Temp src: Oral (12/07 0050) BP: 123/79 mmHg (12/07 0432) Pulse Rate: 74  (12/07 0432)  Labs:  [name] 10/20/11 0615 10/19/11 0915 10/19/11 0615 10/19/11 0032 10/18/11 0538  HGB 9.4* -- 9.9* -- --  HCT 29.2* -- 30.8* 30.9* --  PLT 116* -- 113* 114* --  APTT -- -- -- -- --  LABPROT -- -- -- -- --  INR -- -- -- -- --  HEPARINUNFRC 0.34 0.46 -- 0.46 --  CREATININE 1.10 -- -- 1.03 0.85  CKTOTAL -- -- -- -- --  CKMB -- -- -- -- --  TROPONINI -- -- -- -- --   Estimated Creatinine Clearance: 76.9 ml/min (by C-G formula based on Cr of 1.1).  Results for orders placed during the hospital encounter of 10/14/11  WOUND CULTURE     Status: Normal   Collection Time   10/14/11 10:29 PM      Component Value Range Status Comment   Specimen  Description WOUND RIGHT POSTERIOR FOOT   Final    Special Requests NONE   Final    Gram Stain     Final    Value: NO WBC SEEN     NO SQUAMOUS EPITHELIAL CELLS SEEN     RARE GRAM NEGATIVE RODS   Culture ABUNDANT PSEUDOMONAS AERUGINOSA   Final    Report Status 10/19/2011 FINAL   Final    Organism ID, Bacteria PSEUDOMONAS AERUGINOSA   Final   MRSA PCR SCREENING     Status: Normal   Collection Time   10/15/11  7:07 AM      Component Value Range Status Comment   MRSA by PCR NEGATIVE  NEGATIVE  Final   SURGICAL PCR SCREEN     Status: Normal   Collection Time   10/17/11  7:51 PM      Component Value Range Status Comment   MRSA, PCR NEGATIVE  NEGATIVE  Final    Staphylococcus aureus NEGATIVE  NEGATIVE  Final   CULTURE, ROUTINE-ABSCESS     Status: Normal (Preliminary result)   Collection Time   10/17/11  9:30 PM      Component Value Range Status Comment   Specimen Description ABSCESS FOOT RIGHT   Final    Special Requests NONE   Final    Gram Stain     Final    Value: ABUNDANT WBC PRESENT,BOTH PMN AND MONONUCLEAR     RARE SQUAMOUS EPITHELIAL CELLS PRESENT  FEW GRAM POSITIVE COCCI IN PAIRS     RARE GRAM NEGATIVE RODS   Culture     Final    Value: FEW KLEBSIELLA OXYTOCA     MODERATE ENTEROCOCCUS SPECIES   Report Status PENDING   Incomplete    Organism ID, Bacteria KLEBSIELLA OXYTOCA   Final   ANAEROBIC CULTURE     Status: Normal (Preliminary result)   Collection Time   10/17/11  9:30 PM      Component Value Range Status Comment   Specimen Description ABSCESS FOOT RIGHT   Final    Special Requests NONE   Final    Gram Stain     Final    Value: ABUNDANT WBC PRESENT,BOTH PMN AND MONONUCLEAR     RARE SQUAMOUS EPITHELIAL CELLS PRESENT     FEW GRAM POSITIVE COCCI IN PAIRS     RARE GRAM NEGATIVE RODS   Culture     Final    Value: NO ANAEROBES ISOLATED; CULTURE IN PROGRESS FOR 5 DAYS   Report Status PENDING   Incomplete     Medications:  Scheduled:    . aspirin  325 mg Oral Daily    . diltiazem  120 mg Oral Daily  . docusate sodium  100 mg Oral BID  . insulin aspart  0-15 Units Subcutaneous TID WC  . insulin aspart  0-5 Units Subcutaneous QHS  . insulin aspart  10 Units Subcutaneous TID WC  . insulin glargine  30 Units Subcutaneous Q0700  . metoprolol tartrate  50 mg Oral BID  . pantoprazole  40 mg Oral Q1200  . piperacillin-tazobactam (ZOSYN)  IV  3.375 g Intravenous Q8H  . potassium chloride  40 mEq Oral Once  . rosuvastatin  20 mg Oral q1800  . senna  1 tablet Oral BID  . vancomycin  1,000 mg Intravenous Q12H  . DISCONTD: insulin glargine  25 Units Subcutaneous Q0700

## 2011-10-20 NOTE — Progress Notes (Signed)
Subjective: Still spiking fevers, no other complaints    Objective:  Weight change: -0.16 kg (-5.6 oz)  Intake/Output Summary (Last 24 hours) at 10/20/11 1432 Last data filed at 10/20/11 1350  Gross per 24 hour  Intake 4749.3 ml  Output   1677 ml  Net 3072.3 ml   Blood pressure 124/71, pulse 78, temperature 98.8 F (37.1 C), temperature source Oral, resp. rate 18, height 5\' 7"  (1.702 m), weight 84.3 kg (185 lb 13.6 oz), SpO2 90.00%.  On Exam alert, comfortable.   Gen: Alert and oriented x3 not in any distress Heart:S1S2 Irreg, irreg.  Lungs:CTAB, no wheezing or rhonchi. ABD: soft non tender , bowel sounds present. Extremities. Right foot dressing intact  Neuro: Alert and oriented x3 no focal neurological deficits noted   Lab Results: Results for orders placed during the hospital encounter of 10/14/11 (from the past 24 hour(s))  GLUCOSE, CAPILLARY     Status: Abnormal   Collection Time   10/19/11  4:11 PM      Component Value Range   Glucose-Capillary 168 (*) 70 - 99 (mg/dL)   Comment 1 Documented in Chart     Comment 2 Notify RN    GLUCOSE, CAPILLARY     Status: Abnormal   Collection Time   10/19/11  9:10 PM      Component Value Range   Glucose-Capillary 196 (*) 70 - 99 (mg/dL)   Comment 1 Notify RN     Comment 2 Documented in Chart    GLUCOSE, CAPILLARY     Status: Abnormal   Collection Time   10/20/11  5:48 AM      Component Value Range   Glucose-Capillary 198 (*) 70 - 99 (mg/dL)  BASIC METABOLIC PANEL     Status: Abnormal   Collection Time   10/20/11  6:15 AM      Component Value Range   Sodium 136  135 - 145 (mEq/L)   Potassium 3.1 (*) 3.5 - 5.1 (mEq/L)   Chloride 98  96 - 112 (mEq/L)   CO2 32  19 - 32 (mEq/L)   Glucose, Bld 231 (*) 70 - 99 (mg/dL)   BUN 7  6 - 23 (mg/dL)   Creatinine, Ser 1.61  0.50 - 1.35 (mg/dL)   Calcium 7.6 (*) 8.4 - 10.5 (mg/dL)   GFR calc non Af Amer 73 (*) >90 (mL/min)   GFR calc Af Amer 84 (*) >90 (mL/min)  CBC     Status:  Abnormal   Collection Time   10/20/11  6:15 AM      Component Value Range   WBC 4.5  4.0 - 10.5 (K/uL)   RBC 3.54 (*) 4.22 - 5.81 (MIL/uL)   Hemoglobin 9.4 (*) 13.0 - 17.0 (g/dL)   HCT 09.6 (*) 04.5 - 52.0 (%)   MCV 82.5  78.0 - 100.0 (fL)   MCH 26.6  26.0 - 34.0 (pg)   MCHC 32.2  30.0 - 36.0 (g/dL)   RDW 40.9  81.1 - 91.4 (%)   Platelets 116 (*) 150 - 400 (K/uL)  HEPARIN LEVEL (UNFRACTIONATED)     Status: Normal   Collection Time   10/20/11  6:15 AM      Component Value Range   Heparin Unfractionated 0.34  0.30 - 0.70 (IU/mL)  SEDIMENTATION RATE     Status: Abnormal   Collection Time   10/20/11  6:15 AM      Component Value Range   Sed Rate 128 (*) 0 - 16 (mm/hr)  C-REACTIVE PROTEIN     Status: Abnormal   Collection Time   10/20/11  6:15 AM      Component Value Range   CRP 19.89 (*) <0.60 (mg/dL)  GLUCOSE, CAPILLARY     Status: Abnormal   Collection Time   10/20/11 11:20 AM      Component Value Range   Glucose-Capillary 244 (*) 70 - 99 (mg/dL)   Comment 1 Notify RN       Micro Results: Recent Results (from the past 240 hour(s))  WOUND CULTURE     Status: Normal   Collection Time   10/14/11 10:29 PM      Component Value Range Status Comment   Specimen Description WOUND RIGHT POSTERIOR FOOT   Final    Special Requests NONE   Final    Gram Stain     Final    Value: NO WBC SEEN     NO SQUAMOUS EPITHELIAL CELLS SEEN     RARE GRAM NEGATIVE RODS   Culture ABUNDANT PSEUDOMONAS AERUGINOSA   Final    Report Status 10/19/2011 FINAL   Final    Organism ID, Bacteria PSEUDOMONAS AERUGINOSA   Final   MRSA PCR SCREENING     Status: Normal   Collection Time   10/15/11  7:07 AM      Component Value Range Status Comment   MRSA by PCR NEGATIVE  NEGATIVE  Final   SURGICAL PCR SCREEN     Status: Normal   Collection Time   10/17/11  7:51 PM      Component Value Range Status Comment   MRSA, PCR NEGATIVE  NEGATIVE  Final    Staphylococcus aureus NEGATIVE  NEGATIVE  Final   CULTURE,  ROUTINE-ABSCESS     Status: Normal (Preliminary result)   Collection Time   10/17/11  9:30 PM      Component Value Range Status Comment   Specimen Description ABSCESS FOOT RIGHT   Final    Special Requests NONE   Final    Gram Stain     Final    Value: ABUNDANT WBC PRESENT,BOTH PMN AND MONONUCLEAR     RARE SQUAMOUS EPITHELIAL CELLS PRESENT     FEW GRAM POSITIVE COCCI IN PAIRS     RARE GRAM NEGATIVE RODS   Culture     Final    Value: FEW KLEBSIELLA OXYTOCA     MODERATE ENTEROCOCCUS SPECIES   Report Status PENDING   Incomplete    Organism ID, Bacteria KLEBSIELLA OXYTOCA   Final   ANAEROBIC CULTURE     Status: Normal (Preliminary result)   Collection Time   10/17/11  9:30 PM      Component Value Range Status Comment   Specimen Description ABSCESS FOOT RIGHT   Final    Special Requests NONE   Final    Gram Stain     Final    Value: ABUNDANT WBC PRESENT,BOTH PMN AND MONONUCLEAR     RARE SQUAMOUS EPITHELIAL CELLS PRESENT     FEW GRAM POSITIVE COCCI IN PAIRS     RARE GRAM NEGATIVE RODS   Culture     Final    Value: NO ANAEROBES ISOLATED; CULTURE IN PROGRESS FOR 5 DAYS   Report Status PENDING   Incomplete     Studies/Results: Ct Angio Chest W/cm &/or Wo Cm  10/15/2011  *RADIOLOGY REPORT*  Clinical Data:  Chest pain with shortness of breath and elevated D- dimer levels.  Diabetes.  Question pulmonary embolism.  CT ANGIOGRAPHY  CHEST WITH CONTRAST  Technique:  Multidetector CT imaging of the chest was performed using the standard protocol during bolus administration of intravenous contrast.  Multiplanar CT image reconstructions including MIPs were obtained to evaluate the vascular anatomy.  Contrast: OMNIPAQUE IOHEXOL 300 MG/ML IV SOLN  Comparison:  None.  Findings:  The pulmonary arteries are well opacified with contrast. There is no evidence of acute pulmonary embolism.  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  The heart is mildly enlarged.  No enlarged mediastinal or hilar lymph  nodes are demonstrated.  A pretracheal node demonstrates a fatty hilum.  There is no pleural or pericardial effusion.  The lungs are clear.  Images through the upper abdomen demonstrate contour irregularity of the liver suspicious for cirrhosis.  The spleen appears mildly enlarged.  There are multiple small calcified gallstones.  Review of the MIP images confirms the above findings.  IMPRESSION:  1.  No evidence of acute pulmonary embolism or other acute chest process. 2.  Cholelithiasis. 3.  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  Correlate clinically. 4.  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  Original Report Authenticated By: Gerrianne Scale, M.D.   Mr Foot Right W Wo Contrast  10/15/2011  *RADIOLOGY REPORT*  Clinical Data: Infected diabetic foot ulcer.  MRI OF THE RIGHT FOREFOOT WITHOUT AND WITH CONTRAST  Technique:  Multiplanar, multisequence MR imaging was performed both before and after administration of intravenous contrast.  Contrast: 20mL MULTIHANCE GADOBENATE DIMEGLUMINE 529 MG/ML IV SOLN  Comparison: Right foot radiographs.  Findings: There is a large soft tissue abscess involving the medial aspect of the forefoot it is largely between the first and second toe is extending along the dorsum of the first metatarsal.  As noted on the plain films there is extensive gas formation in the soft tissues.  No obvious changes of septic arthritis but there are findings for osteomyelitis involving the first metatarsal and first proximal phalanx.  There is associated diffuse cellulitis and myofasciitis.  No involvement of the forefoot or visualized hind foot bony structures.  IMPRESSION:  1.  Large soft tissue abscess involving the medial forefoot with gas formation. 2.  Osteomyelitis involving the first metatarsal and first proximal phalanx. 3.  Diffuse cellulitis and myofasciitis.  Original Report Authenticated By: P. Loralie Champagne, M.D.   Dg Foot 2 Views Left  10/15/2011  *RADIOLOGY  REPORT*  Clinical Data: Penetrating wounds to the ball of the left foot, with worsening erythema and blistering.  LEFT FOOT - 2 VIEW  Comparison: None.  Findings: There is no evidence of fracture or dislocation.  No osseous erosions are seen to suggest osteomyelitis.  The joint spaces are preserved.  There is no evidence of talar subluxation; the subtalar joint is unremarkable in appearance.  Soft tissue swelling and defect are noted along the plantar aspect of the forefoot.  IMPRESSION:  1.  No evidence of fracture or dislocation.  No definite evidence for osteomyelitis, though if there is significant clinical concern for osteomyelitis, MRI could be considered for further evaluation. 2.  Soft tissue defect and swelling along the plantar aspect of the forefoot.  Original Report Authenticated By: Tonia Ghent, M.D.   Dg Foot 2 Views Right  10/15/2011  *RADIOLOGY REPORT*  Clinical Data: Penetrating wounds to the ball of the right foot from blisters, with diffuse erythema and pain.  RIGHT FOOT - 2 VIEW  Comparison: None.  Findings: There is no evidence of fracture or dislocation.  No osseous erosions are seen  to suggest osteomyelitis.  The joint spaces are preserved.  There is no evidence of talar subluxation; the subtalar joint is unremarkable in appearance.  Significant diffuse soft tissue air is noted tracking about the first toe and dorsal to the forefoot, concerning for necrotizing fasciitis.  Underlying soft tissue defects are noted along the plantar aspect of the forefoot.  IMPRESSION:  1.  No evidence of fracture or dislocation.  No definite evidence for osteomyelitis, though if there is significant clinical concern for osteomyelitis, MRI could be considered for further evaluation. 2.  Significant diffuse soft tissue air about the first toe and dorsum of the forefoot, concerning for necrotizing fasciitis.  Findings were discussed with Earley Favor at 01:03 a.m. on 10/14/2011.  Original Report Authenticated  By: Tonia Ghent, M.D.   Medications: Scheduled Meds:    . aspirin  325 mg Oral Daily  . diltiazem  120 mg Oral Daily  . docusate sodium  100 mg Oral BID  . insulin aspart  0-15 Units Subcutaneous TID WC  . insulin aspart  0-5 Units Subcutaneous QHS  . insulin aspart  10 Units Subcutaneous TID WC  . insulin glargine  30 Units Subcutaneous Q0700  . metoprolol tartrate  50 mg Oral BID  . pantoprazole  40 mg Oral Q1200  . piperacillin-tazobactam (ZOSYN)  IV  3.375 g Intravenous Q8H  . rosuvastatin  20 mg Oral q1800  . senna  1 tablet Oral BID  . vancomycin  1,000 mg Intravenous Q12H  . DISCONTD: insulin glargine  25 Units Subcutaneous Q0700   Continuous Infusions:    . sodium chloride 75 mL/hr at 10/20/11 1350  . heparin 1,600 Units/hr (10/19/11 2114)  . heparin 1,600 Units/hr (10/20/11 1249)  . DISCONTD: lactated ringers Stopped (10/18/11 0348)   PRN Meds:.acetaminophen, acetaminophen, HYDROmorphone, ondansetron (ZOFRAN) IV, ondansetron, oxyCODONE, polyethylene glycol  Wound culture from 10/14/2011  Specimen Description  WOUND RIGHT POSTERIOR FOOT   Special Requests  NONE   Gram Stain  NO WBC SEEN NO SQUAMOUS EPITHELIAL CELLS SEEN RARE GRAM NEGATIVE RODS   Culture  ABUNDANT PSEUDOMONAS AERUGINOSA   Report Status  PENDING   Organism ID, Bacteria  PSEUDOMONAS AERUGINOSA   Resulting Agency  [facility]    Culture & Susceptibility     PSEUDOMONAS AERUGINOSA          Antibiotic  Sensitivity  Microscan  Status      CEFEPIME  Sensitive  2  Final      Method:  MIC      CEFTAZIDIME  Sensitive  <=1  Final      Method:  MIC      CIPROFLOXACIN  Sensitive  <=0.25  Final      Method:  MIC      GENTAMICIN  Sensitive  2  Final      Method:  MIC      IMIPENEM  Sensitive  <=1  Final      Method:  MIC      TOBRAMYCIN  Sensitive  <=1  Final      Method:  MIC       Comments  PSEUDOMONAS AERUGINOSA (MIC)        ABUNDANT PSEUDOMONAS AERUGINOSA      Assessment/Plan:  -  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 Wound cultures growing pseudomonas but other organisms including staph, MRSA still possible - Repeat MRI of the right  foot still pending, ID (Dr. Algis Liming) following, will await MRI results for further management   CAD (coronary artery disease), autologous vein bypass graft / s/p 2 stent placement: went into atrial flutter on admission was found to have elevated troponins, was  treated for NSTEMI/ Demand ischemia with IV heparin and IV Cardizem. His rate was better controlled and IV cardizem was changed to po cardizem. IV  Heparin was held for surgery and restarted after surgery.  - Continue with aspirin/ metoprolol/ crestor.  - Placed back on heparin IV, will restart Coumadin after the MRI results to assess if the patient needs any further surgery.  Atrial flutter with NSTEMI/demand ischemia: Followed by cardiology - Continue metoprolol and Cardizem by mouth, on heparin drip.  - DCCV outpatient per Dr. Blanchie Dessert recommendations  Hypertension controlled  Diabetes Mellitus: Still uncontrolled, patient was on metformin prior to admission, he likely needs insulin outpatient -  Increase Lantus to 40 units, continue meal coverage and SSI. Change diet to carb modified, noted that it is currently heart healthy diet  DVT prophylaxis: On heparin drip   Trennon Torbeck 10/20/2011, 2:32 PM

## 2011-10-20 NOTE — Progress Notes (Signed)
Physical Therapy Evaluation Patient Details Name: [name] MRN: [id number] DOB: 1956-01-10 Today's Date: 10/20/2011  Problem List:  Patient Active Problem List  Diagnoses  . CAD (coronary artery disease), autologous vein bypass graft  . HTN (hypertension)  . Diabetic foot ulcers  . Diabetes mellitus  . Soft tissue infection of foot  . Atrial flutter with rapid ventricular response  . NSTEMI (non-ST elevated myocardial infarction)    Past Medical History:  Past Medical History  Diagnosis Date  . CAD (coronary artery disease), autologous vein bypass graft     s/p CABG in 80's or 90's, then cath x2, last in the 90's with DES and BMS placed but not sure where. Dr. Lupita Shutter at [facility]   . HTN (hypertension)   . 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 Diabetes mellitus   . Anxiety   . Headache   . Atrial flutter with rapid ventricular response 10/15/2011  . NSTEMI (non-ST elevated myocardial infarction) 10/17/2011   Past Surgical History:  Past Surgical History  Procedure Date  . Coronary artery bypass graft   . No past surgeries   . Amputation 10/17/2011    Procedure: AMPUTATION RAY;  Surgeon: Cammy Copa;  Location: [facility] OR;  Service: Orthopedics;  Laterality: Right;  First and Second Ray Amputation    PT Assessment/Plan/Recommendation PT Assessment Clinical Impression Statement: Pt presents with decreased balance and mobility.  Pt was impulsive with decreased safety awareness.  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  Pt reports family is looking into getting a caregiver to come in and  assist both him and his father as neccessary.  If pt does not progress, or family does not work out having a caregiver come in, pt may need to go to a rehab facility.  PT Recommendation/Assessment: Patient will need skilled PT in the acute care venue PT Problem List: Decreased strength;Decreased activity tolerance;Decreased balance;Decreased mobility;Decreased coordination;Decreased knowledge of use of DME;Decreased safety awareness;Cardiopulmonary status limiting activity;Pain Barriers to Discharge: Decreased caregiver support PT Therapy Diagnosis : Difficulty walking;Abnormality of gait;Generalized weakness;Acute pain PT Plan PT Frequency: Min 3X/week PT Treatment/Interventions: DME instruction;Gait training;Stair training;Functional mobility training;Therapeutic activities;Therapeutic exercise;Balance training;Patient/family education PT Recommendation Recommendations for Other Services: OT consult Follow Up Recommendations: Home health PT;24 hour supervision/assistance (May need SNF it pt doesn't progress) Equipment Recommended: Defer to next venue;Rolling walker with 5" wheels;3 in 1 bedside comode PT Goals  Acute Rehab PT Goals PT Goal Formulation: With patient Time For Goal Achievement: 7 days Pt will go Sit to Stand: with modified independence PT Goal: Sit to Stand - Progress: Progressing toward goal Pt will go Stand to Sit: with modified independence PT Goal: Stand to Sit - Progress: Progressing toward goal Pt will Transfer Bed to Chair/Chair to Bed: with modified independence PT Transfer Goal: Bed to Chair/Chair to Bed - Progress: Progressing toward goal Pt will Ambulate: >150 feet;with modified independence;with least restrictive assistive device;with gait velocity >(comment) ft/second (>1.8 ft/sec to decrease fall risk) PT Goal: Ambulate - Progress: Progressing toward goal Pt will Go Up / Down Stairs: 1-2 stairs;with modified independence;with  least restrictive assistive  device;with rail(s) PT Goal: Up/Down Stairs - Progress: Not met  PT Evaluation Precautions/Restrictions  Precautions Precautions: Fall Restrictions Weight Bearing Restrictions: Yes RLE Weight Bearing: Weight bearing as tolerated (through heel) Prior Functioning  Home Living Lives With: Family (Father and brother) Type of Home: House Home Layout: One level Home Access: Stairs to enter Entrance Stairs-Rails: Can reach both Entrance Stairs-Number of Steps: 2 Bathroom Shower/Tub: Engineer, manufacturing systems: Standard Prior Function Level of Independence: Independent with basic ADLs;Independent with homemaking with ambulation;Independent with gait;Independent with transfers Able to Take Stairs?: Yes Driving: Yes Vocation: Unemployed Comments: Pt became agitated with prior level of function questioning - saying "I was perfectly fine before this.  Did all the same things you can do!" Cognition Cognition Arousal/Alertness: Awake/alert Overall Cognitive Status: Impaired Orientation Level: Oriented X4 Safety/Judgement: Decreased safety judgement for tasks assessed Decreased Safety/Judgement: Impulsive Sensation/Coordination   Extremity Assessment RUE Assessment RUE Assessment: Within Functional Limits LUE Assessment LUE Assessment: Within Functional Limits RLE Assessment RLE Assessment: Within Functional Limits LLE Assessment LLE Assessment: Within Functional Limits Mobility (including Balance) Bed Mobility Bed Mobility: Yes Supine to Sit: 6: Modified independent (Device/Increase time) Sitting - Scoot to Edge of Bed: 5: Supervision Sitting - Scoot to [place] of Bed Details (indicate cue type and reason): Cues to initiate scoot. Sit to Supine - Right: 4: Min assist;HOB flat Sit to Supine - Right Details (indicate cue type and reason): Min A to mobilize bilateral LEs and organize lines and leads. Transfers Transfers: Yes Sit to Stand: 4: Min assist;With upper extremity  assist;From bed;From toilet;From chair/3-in-1 Sit to Stand Details (indicate cue type and reason): Min A for balance and safety.  Verbal cues for hand placement. Stand to Sit: 4: Min assist;With upper extremity assist;With armrests;To chair/3-in-1;To toilet;To bed Stand to Sit Details: Min A for pt balance and controlled descent.  Verbal cues for UE and LE placement. Ambulation/Gait Ambulation/Gait: Yes Ambulation/Gait Assistance: 4: Min assist Ambulation/Gait Assistance Details (indicate cue type and reason): Pt required min A to maintain balance with ambulation.  Verbal cues for proper RW use, upright posture.  Pt experience one severe loss of balance and required +2 A to regain balance. Ambulation Distance (Feet): 30 Feet Assistive device: Rolling walker Gait Pattern: Step-to pattern;Decreased stance time - right;Antalgic;Shuffle Gait velocity: Decreased, not measured due to limited ambulation distance in room. Stairs: No Wheelchair Mobility Wheelchair Mobility: No  Posture/Postural Control Posture/Postural Control: No significant limitations Balance Balance Assessed: No Exercise    End of Session PT - End of Session Equipment Utilized During Treatment: Gait belt;Other (comment);Right ankle foot orthosis (RW) Activity Tolerance: Patient tolerated treatment well;Treatment limited secondary to agitation Patient left: in bed;with call bell in reach;Other (comment) (with nurse present) Nurse Communication: Mobility status for transfers;Mobility status for ambulation;Weight bearing status General Behavior During Session: Agitated Cognition: WFL for tasks performed  Venesha Petraitis 10/20/2011, 2:56 PM [phone]  Lacinda Axon, SPT

## 2011-10-20 NOTE — Progress Notes (Signed)
Pt. Seen and examined. Agree with the NP/PA-C note as written.  Given ongoing issues with infection and that he is asymptomatic with a-fib and rate controlled, would favor anticoagulation and rate control. When his infection has resolved, would consider TEE/Cardioversion, but this is likely as an outpatient in the future.  Chrystie Nose, MD Attending Cardiologist [facility]

## 2011-10-20 NOTE — Progress Notes (Signed)
Subjective: No new complaints  Objective: Weight change: -5.6 oz (-0.16 kg)  Intake/Output Summary (Last 24 hours) at 10/20/11 2103 Last data filed at 10/20/11 1848  Gross per 24 hour  Intake 3481.8 ml  Output   2377 ml  Net 1104.8 ml   Blood pressure 124/71, pulse 78, temperature 98.8 F (37.1 C), temperature source Oral, resp. rate 18, height 5\' 7"  (1.702 m), weight 185 lb 13.6 oz (84.3 kg), SpO2 90.00%. Temp:  [98 F (36.7 C)-102.3 F (39.1 C)] 98.8 F (37.1 C) (12/07 1322) Pulse Rate:  [74-95] 78  (12/07 1322) Resp:  [18] 18  (12/07 1322) BP: (123-160)/(71-79) 124/71 mmHg (12/07 1322) SpO2:  [90 %-98 %] 90 % (12/07 1322) Weight:  [185 lb 13.6 oz (84.3 kg)] 185 lb 13.6 oz (84.3 kg) (12/07 0427)  Physical Exam: General: Alert and awake, oriented x3, not in any acute distress. HEENT: anicteric sclera, pupils reactive to light and accommodation, EOMI CVS regular rate, normal r,  no murmur rubs or gallops Chest: clear to auscultation bilaterally, no wheezing, rales or rhonchi Abdomen: soft nontender, nondistended, normal bowel sounds, Extremities: no  clubbing or edema noted bilaterally Skin: no rashes Lymph: no new lymphadenopathy Neuro: nonfocal  Lab Results:  Basename 10/20/11 0615 10/19/11 0615  WBC 4.5 7.6  HGB 9.4* 9.9*  HCT 29.2* 30.8*  PLT 116* 113*   BMET  Basename 10/20/11 0615 10/19/11 0032  NA 136 130*  K 3.1* 3.4*  CL 98 95*  CO2 32 27  GLUCOSE 231* 275*  BUN 7 10  CREATININE 1.10 1.03  CALCIUM 7.6* 7.6*    Micro Results: Recent Results (from the past 240 hour(s))  WOUND CULTURE     Status: Normal   Collection Time   10/14/11 10:29 PM      Component Value Range Status Comment   Specimen Description WOUND RIGHT POSTERIOR FOOT   Final    Special Requests NONE   Final    Gram Stain     Final    Value: NO WBC SEEN     NO SQUAMOUS EPITHELIAL CELLS SEEN     RARE GRAM NEGATIVE RODS   Culture ABUNDANT PSEUDOMONAS AERUGINOSA   Final    Report  Status 10/19/2011 FINAL   Final    Organism ID, Bacteria PSEUDOMONAS AERUGINOSA   Final   MRSA PCR SCREENING     Status: Normal   Collection Time   10/15/11  7:07 AM      Component Value Range Status Comment   MRSA by PCR NEGATIVE  NEGATIVE  Final   SURGICAL PCR SCREEN     Status: Normal   Collection Time   10/17/11  7:51 PM      Component Value Range Status Comment   MRSA, PCR NEGATIVE  NEGATIVE  Final    Staphylococcus aureus NEGATIVE  NEGATIVE  Final   CULTURE, ROUTINE-ABSCESS     Status: Normal (Preliminary result)   Collection Time   10/17/11  9:30 PM      Component Value Range Status Comment   Specimen Description ABSCESS FOOT RIGHT   Final    Special Requests NONE   Final    Gram Stain     Final    Value: ABUNDANT WBC PRESENT,BOTH PMN AND MONONUCLEAR     RARE SQUAMOUS EPITHELIAL CELLS PRESENT     FEW GRAM POSITIVE COCCI IN PAIRS     RARE GRAM NEGATIVE RODS   Culture     Final    Value:  FEW KLEBSIELLA OXYTOCA     MODERATE ENTEROCOCCUS SPECIES   Report Status PENDING   Incomplete    Organism ID, Bacteria KLEBSIELLA OXYTOCA   Final   ANAEROBIC CULTURE     Status: Normal (Preliminary result)   Collection Time   10/17/11  9:30 PM      Component Value Range Status Comment   Specimen Description ABSCESS FOOT RIGHT   Final    Special Requests NONE   Final    Gram Stain     Final    Value: ABUNDANT WBC PRESENT,BOTH PMN AND MONONUCLEAR     RARE SQUAMOUS EPITHELIAL CELLS PRESENT     FEW GRAM POSITIVE COCCI IN PAIRS     RARE GRAM NEGATIVE RODS   Culture     Final    Value: NO ANAEROBES ISOLATED; CULTURE IN PROGRESS FOR 5 DAYS   Report Status PENDING   Incomplete     Studies/Results: Ct Angio Chest W/cm &/or Wo Cm  10/15/2011  *RADIOLOGY REPORT*  Clinical Data:  Chest pain with shortness of breath and elevated D- dimer levels.  Diabetes.  Question pulmonary embolism.  CT ANGIOGRAPHY CHEST WITH CONTRAST  Technique:  Multidetector CT imaging of the chest was performed using the  standard protocol during bolus administration of intravenous contrast.  Multiplanar CT image reconstructions including MIPs were obtained to evaluate the vascular anatomy.  Contrast: OMNIPAQUE IOHEXOL 300 MG/ML IV SOLN  Comparison:  None.  Findings:  The pulmonary arteries are well opacified with contrast. There is no evidence of acute pulmonary embolism.  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  The heart is mildly enlarged.  No enlarged mediastinal or hilar lymph nodes are demonstrated.  A pretracheal node demonstrates a fatty hilum.  There is no pleural or pericardial effusion.  The lungs are clear.  Images through the upper abdomen demonstrate contour irregularity of the liver suspicious for cirrhosis.  The spleen appears mildly enlarged.  There are multiple small calcified gallstones.  Review of the MIP images confirms the above findings.  IMPRESSION:  1.  No evidence of acute pulmonary embolism or other acute chest process. 2.  Cholelithiasis. 3.  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  Correlate clinically. 4.  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  Original Report Authenticated By: Gerrianne Scale, M.D.   Mr Foot Right W Wo Contrast  10/15/2011  *RADIOLOGY REPORT*  Clinical Data: Infected diabetic foot ulcer.  MRI OF THE RIGHT FOREFOOT WITHOUT AND WITH CONTRAST  Technique:  Multiplanar, multisequence MR imaging was performed both before and after administration of intravenous contrast.  Contrast: 20mL MULTIHANCE GADOBENATE DIMEGLUMINE 529 MG/ML IV SOLN  Comparison: Right foot radiographs.  Findings: There is a large soft tissue abscess involving the medial aspect of the forefoot it is largely between the first and second toe is extending along the dorsum of the first metatarsal.  As noted on the plain films there is extensive gas formation in the soft tissues.  No obvious changes of septic arthritis but there are findings for osteomyelitis involving the first metatarsal  and first proximal phalanx.  There is associated diffuse cellulitis and myofasciitis.  No involvement of the forefoot or visualized hind foot bony structures.  IMPRESSION:  1.  Large soft tissue abscess involving the medial forefoot with gas formation. 2.  Osteomyelitis involving the first metatarsal and first proximal phalanx. 3.  Diffuse cellulitis and myofasciitis.  Original Report Authenticated By: P. Loralie Champagne, M.D.   Dg Foot 2 Views Left  10/15/2011  *RADIOLOGY REPORT*  Clinical Data: Penetrating wounds to the ball of the left foot, with worsening erythema and blistering.  LEFT FOOT - 2 VIEW  Comparison: None.  Findings: There is no evidence of fracture or dislocation.  No osseous erosions are seen to suggest osteomyelitis.  The joint spaces are preserved.  There is no evidence of talar subluxation; the subtalar joint is unremarkable in appearance.  Soft tissue swelling and defect are noted along the plantar aspect of the forefoot.  IMPRESSION:  1.  No evidence of fracture or dislocation.  No definite evidence for osteomyelitis, though if there is significant clinical concern for osteomyelitis, MRI could be considered for further evaluation. 2.  Soft tissue defect and swelling along the plantar aspect of the forefoot.  Original Report Authenticated By: Tonia Ghent, M.D.   Dg Foot 2 Views Right  10/15/2011  *RADIOLOGY REPORT*  Clinical Data: Penetrating wounds to the ball of the right foot from blisters, with diffuse erythema and pain.  RIGHT FOOT - 2 VIEW  Comparison: None.  Findings: There is no evidence of fracture or dislocation.  No osseous erosions are seen to suggest osteomyelitis.  The joint spaces are preserved.  There is no evidence of talar subluxation; the subtalar joint is unremarkable in appearance.  Significant diffuse soft tissue air is noted tracking about the first toe and dorsal to the forefoot, concerning for necrotizing fasciitis.  Underlying soft tissue defects are noted along  the plantar aspect of the forefoot.  IMPRESSION:  1.  No evidence of fracture or dislocation.  No definite evidence for osteomyelitis, though if there is significant clinical concern for osteomyelitis, MRI could be considered for further evaluation. 2.  Significant diffuse soft tissue air about the first toe and dorsum of the forefoot, concerning for necrotizing fasciitis.  Findings were discussed with Earley Favor at 01:03 a.m. on 10/14/2011.  Original Report Authenticated By: Tonia Ghent, M.D.    Antibiotics:  Anti-infectives     Start     Dose/Rate Route Frequency Ordered Stop   10/15/11 1000   vancomycin (VANCOCIN) IVPB 1000 mg/200 mL premix        1,000 mg 200 mL/hr over 60 Minutes Intravenous Every 12 hours 10/15/11 0715     10/15/11 0800  piperacillin-tazobactam (ZOSYN) IVPB 3.375 g       3.375 g 12.5 mL/hr over 240 Minutes Intravenous 3 times per day 10/15/11 0715     10/14/11 2300   vancomycin (VANCOCIN) IVPB 1000 mg/200 mL premix        1,000 mg 200 mL/hr over 60 Minutes Intravenous  Once 10/14/11 2256 10/15/11 0104          Medications: Scheduled Meds:   . aspirin  325 mg Oral Daily  . diltiazem  120 mg Oral Daily  . docusate sodium  100 mg Oral BID  . insulin aspart  0-15 Units Subcutaneous TID WC  . insulin aspart  0-5 Units Subcutaneous QHS  . insulin aspart  10 Units Subcutaneous TID WC  . insulin glargine  40 Units Subcutaneous Q0700  . metoprolol tartrate  50 mg Oral BID  . pantoprazole  40 mg Oral Q1200  . piperacillin-tazobactam (ZOSYN)  IV  3.375 g Intravenous Q8H  . rosuvastatin  20 mg Oral q1800  . senna  1 tablet Oral BID  . vancomycin  1,000 mg Intravenous Q12H  . DISCONTD: insulin glargine  30 Units Subcutaneous Q0700   Continuous Infusions:   . sodium chloride 75 mL/hr  at 10/20/11 1350  . heparin 1,600 Units/hr (10/19/11 2114)  . heparin 1,600 Units/hr (10/20/11 1249)   PRN Meds:.acetaminophen, acetaminophen, HYDROmorphone, ondansetron  (ZOFRAN) IV, ondansetron, oxyCODONE, polyethylene glycol  Assessment/Plan: [name] is a 55 y.o. 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 He is growing Enterococcus and Klebsiella from abscess cx, Pseudomonas from wound cx  --continue vancomycin and zosyn for now while in house --fu MRI foot --close monitoring of site --consider MRI of this foot  --If and when he gets "out of the woods" from this infection, it is not clear how comfortable he would be with home IV antibiotics   2) Screening:  Low risk for HIV, but fu test    Dr. Orvan Falconer available on the weekend for questions.    LOS: 6 days    Acey Lav 10/20/2011, 9:03 PM

## 2011-10-20 NOTE — Progress Notes (Signed)
Spoke w pt. He lives w his dad. He may need assist w ins at disch. He uses [facility] for meds. Explained about [facility] meter and strips. Will print off pt assist for for ins and get in community assist program card to help w copays. He has inf on [facility] in [employer] [place]. Gave pt [facility] agency list in [place] No pref. Await orders for hhc.

## 2011-10-21 ENCOUNTER — Other Ambulatory Visit (HOSPITAL_COMMUNITY): Payer: Self-pay

## 2011-10-21 ENCOUNTER — Inpatient Hospital Stay (HOSPITAL_COMMUNITY): Payer: Self-pay

## 2011-10-21 LAB — GLUCOSE, CAPILLARY
Glucose-Capillary: 205 mg/dL — ABNORMAL HIGH (ref 70–99)
Glucose-Capillary: 223 mg/dL — ABNORMAL HIGH (ref 70–99)

## 2011-10-21 LAB — CBC
HCT: 31.4 % — ABNORMAL LOW (ref 39.0–52.0)
Hemoglobin: 10.2 g/dL — ABNORMAL LOW (ref 13.0–17.0)
MCHC: 32.5 g/dL (ref 30.0–36.0)
RBC: 3.85 MIL/uL — ABNORMAL LOW (ref 4.22–5.81)

## 2011-10-21 LAB — VANCOMYCIN, TROUGH: Vancomycin Tr: 17.7 ug/mL (ref 10.0–20.0)

## 2011-10-21 LAB — BASIC METABOLIC PANEL
BUN: 8 mg/dL (ref 6–23)
Chloride: 95 mEq/L — ABNORMAL LOW (ref 96–112)
GFR calc Af Amer: 83 mL/min — ABNORMAL LOW (ref >90)
Potassium: 2.9 mEq/L — ABNORMAL LOW (ref 3.5–5.1)
Sodium: 134 mEq/L — ABNORMAL LOW (ref 135–145)

## 2011-10-21 LAB — CULTURE, ROUTINE-ABSCESS

## 2011-10-21 LAB — HEPARIN LEVEL (UNFRACTIONATED): Heparin Unfractionated: 0.5 IU/mL (ref 0.30–0.70)

## 2011-10-21 MED ORDER — GADOBENATE DIMEGLUMINE 529 MG/ML IV SOLN
15.0000 mL | Freq: Once | INTRAVENOUS | Status: AC | PRN
  Administered 2011-10-21: 15 mL via INTRAVENOUS

## 2011-10-21 MED ORDER — LORAZEPAM 0.5 MG PO TABS
1.0000 mg | ORAL_TABLET | Freq: Three times a day (TID) | ORAL | Status: DC | PRN
  Administered 2011-10-21 – 2011-10-24 (×3): 1 mg via ORAL
  Filled 2011-10-21 (×4): qty 1

## 2011-10-21 MED ORDER — POTASSIUM CHLORIDE 10 MEQ/100ML IV SOLN
10.0000 meq | INTRAVENOUS | Status: AC
  Administered 2011-10-21 (×3): 10 meq via INTRAVENOUS
  Filled 2011-10-21 (×3): qty 100

## 2011-10-21 MED ORDER — POTASSIUM CHLORIDE CRYS ER 20 MEQ PO TBCR
40.0000 meq | EXTENDED_RELEASE_TABLET | Freq: Once | ORAL | Status: AC
  Administered 2011-10-21: 40 meq via ORAL
  Filled 2011-10-21: qty 2

## 2011-10-21 NOTE — Progress Notes (Addendum)
Subjective: - Still spiking fevers, no other complaints. MRI of the right foot still not done, ordered on 10/19/2011   Objective:  Weight change: 1.5 kg (3 lb 4.9 oz)  Intake/Output Summary (Last 24 hours) at 10/21/11 1541 Last data filed at 10/21/11 1200  Gross per 24 hour  Intake 976.87 ml  Output   2475 ml  Net -1498.13 ml   Blood pressure 145/77, pulse 90, temperature 99.9 F (37.7 C), temperature source Oral, resp. rate 18, height 5\' 7"  (1.702 m), weight 85.8 kg (189 lb 2.5 oz), SpO2 95.00%.  On Exam alert, comfortable.   Gen: Alert and oriented x3 not in any distress Heart:S1S2 Irreg, irreg.  Lungs:CTAB, no wheezing or rhonchi. ABD: soft non tender , bowel sounds present. Extremities. Right foot dressing intact  Neuro: Alert and oriented x3 no focal neurological deficits noted   Lab Results: Results for orders placed during the hospital encounter of 10/14/11 (from the past 24 hour(s))  GLUCOSE, CAPILLARY     Status: Abnormal   Collection Time   10/20/11  4:13 PM      Component Value Range   Glucose-Capillary 197 (*) 70 - 99 (mg/dL)  GLUCOSE, CAPILLARY     Status: Abnormal   Collection Time   10/20/11 10:02 PM      Component Value Range   Glucose-Capillary 180 (*) 70 - 99 (mg/dL)  BASIC METABOLIC PANEL     Status: Abnormal   Collection Time   10/21/11  6:00 AM      Component Value Range   Sodium 134 (*) 135 - 145 (mEq/L)   Potassium 2.9 (*) 3.5 - 5.1 (mEq/L)   Chloride 95 (*) 96 - 112 (mEq/L)   CO2 32  19 - 32 (mEq/L)   Glucose, Bld 198 (*) 70 - 99 (mg/dL)   BUN 8  6 - 23 (mg/dL)   Creatinine, Ser 4.09  0.50 - 1.35 (mg/dL)   Calcium 7.7 (*) 8.4 - 10.5 (mg/dL)   GFR calc non Af Amer 72 (*) >90 (mL/min)   GFR calc Af Amer 83 (*) >90 (mL/min)  CBC     Status: Abnormal   Collection Time   10/21/11  6:00 AM      Component Value Range   WBC 4.5  4.0 - 10.5 (K/uL)   RBC 3.85 (*) 4.22 - 5.81 (MIL/uL)   Hemoglobin 10.2 (*) 13.0 - 17.0 (g/dL)   HCT 81.1 (*) 91.4 -  52.0 (%)   MCV 81.6  78.0 - 100.0 (fL)   MCH 26.5  26.0 - 34.0 (pg)   MCHC 32.5  30.0 - 36.0 (g/dL)   RDW 78.2  95.6 - 21.3 (%)   Platelets 123 (*) 150 - 400 (K/uL)  HEPARIN LEVEL (UNFRACTIONATED)     Status: Normal   Collection Time   10/21/11  6:00 AM      Component Value Range   Heparin Unfractionated 0.50  0.30 - 0.70 (IU/mL)  GLUCOSE, CAPILLARY     Status: Abnormal   Collection Time   10/21/11  6:38 AM      Component Value Range   Glucose-Capillary 205 (*) 70 - 99 (mg/dL)  VANCOMYCIN, TROUGH     Status: Normal   Collection Time   10/21/11 10:30 AM      Component Value Range   Vancomycin Tr 17.7  10.0 - 20.0 (ug/mL)  GLUCOSE, CAPILLARY     Status: Abnormal   Collection Time   10/21/11 11:07 AM  Component Value Range   Glucose-Capillary 223 (*) 70 - 99 (mg/dL)   Comment 1 Documented in Chart     Comment 2 Notify RN       Micro Results: Recent Results (from the past 240 hour(s))  WOUND CULTURE     Status: Normal   Collection Time   10/14/11 10:29 PM      Component Value Range Status Comment   Specimen Description WOUND RIGHT POSTERIOR FOOT   Final    Special Requests NONE   Final    Gram Stain     Final    Value: NO WBC SEEN     NO SQUAMOUS EPITHELIAL CELLS SEEN     RARE GRAM NEGATIVE RODS   Culture ABUNDANT PSEUDOMONAS AERUGINOSA   Final    Report Status 10/19/2011 FINAL   Final    Organism ID, Bacteria PSEUDOMONAS AERUGINOSA   Final   MRSA PCR SCREENING     Status: Normal   Collection Time   10/15/11  7:07 AM      Component Value Range Status Comment   MRSA by PCR NEGATIVE  NEGATIVE  Final   SURGICAL PCR SCREEN     Status: Normal   Collection Time   10/17/11  7:51 PM      Component Value Range Status Comment   MRSA, PCR NEGATIVE  NEGATIVE  Final    Staphylococcus aureus NEGATIVE  NEGATIVE  Final   CULTURE, ROUTINE-ABSCESS     Status: Normal   Collection Time   10/17/11  9:30 PM      Component Value Range Status Comment   Specimen Description ABSCESS FOOT  RIGHT   Final    Special Requests NONE   Final    Gram Stain     Final    Value: ABUNDANT WBC PRESENT,BOTH PMN AND MONONUCLEAR     RARE SQUAMOUS EPITHELIAL CELLS PRESENT     FEW GRAM POSITIVE COCCI IN PAIRS     RARE GRAM NEGATIVE RODS   Culture     Final    Value: FEW KLEBSIELLA OXYTOCA     MODERATE ENTEROCOCCUS SPECIES   Report Status 10/21/2011 FINAL   Final    Organism ID, Bacteria KLEBSIELLA OXYTOCA   Final    Organism ID, Bacteria ENTEROCOCCUS SPECIES   Final   ANAEROBIC CULTURE     Status: Normal (Preliminary result)   Collection Time   10/17/11  9:30 PM      Component Value Range Status Comment   Specimen Description ABSCESS FOOT RIGHT   Final    Special Requests NONE   Final    Gram Stain     Final    Value: ABUNDANT WBC PRESENT,BOTH PMN AND MONONUCLEAR     RARE SQUAMOUS EPITHELIAL CELLS PRESENT     FEW GRAM POSITIVE COCCI IN PAIRS     RARE GRAM NEGATIVE RODS   Culture     Final    Value: NO ANAEROBES ISOLATED; CULTURE IN PROGRESS FOR 5 DAYS   Report Status PENDING   Incomplete     Studies/Results: Ct Angio Chest W/cm &/or Wo Cm  10/15/2011  *RADIOLOGY REPORT*  Clinical Data:  Chest pain with shortness of breath and elevated D- dimer levels.  Diabetes.  Question pulmonary embolism.  CT ANGIOGRAPHY CHEST WITH CONTRAST  Technique:  Multidetector CT imaging of the chest was performed using the standard protocol during bolus administration of intravenous contrast.  Multiplanar CT image reconstructions including MIPs were obtained to evaluate the vascular  anatomy.  Contrast: OMNIPAQUE IOHEXOL 300 MG/ML IV SOLN  Comparison:  None.  Findings:  The pulmonary arteries are well opacified with contrast. There is no evidence of acute pulmonary embolism.  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  The heart is mildly enlarged.  No enlarged mediastinal or hilar lymph nodes are demonstrated.  A pretracheal node demonstrates a fatty hilum.  There is no pleural or pericardial effusion.   The lungs are clear.  Images through the upper abdomen demonstrate contour irregularity of the liver suspicious for cirrhosis.  The spleen appears mildly enlarged.  There are multiple small calcified gallstones.  Review of the MIP images confirms the above findings.  IMPRESSION:  1.  No evidence of acute pulmonary embolism or other acute chest process. 2.  Cholelithiasis. 3.  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  Correlate clinically. 4.  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  Original Report Authenticated By: Gerrianne Scale, M.D.   Mr Foot Right W Wo Contrast  10/15/2011  *RADIOLOGY REPORT*  Clinical Data: Infected diabetic foot ulcer.  MRI OF THE RIGHT FOREFOOT WITHOUT AND WITH CONTRAST  Technique:  Multiplanar, multisequence MR imaging was performed both before and after administration of intravenous contrast.  Contrast: 20mL MULTIHANCE GADOBENATE DIMEGLUMINE 529 MG/ML IV SOLN  Comparison: Right foot radiographs.  Findings: There is a large soft tissue abscess involving the medial aspect of the forefoot it is largely between the first and second toe is extending along the dorsum of the first metatarsal.  As noted on the plain films there is extensive gas formation in the soft tissues.  No obvious changes of septic arthritis but there are findings for osteomyelitis involving the first metatarsal and first proximal phalanx.  There is associated diffuse cellulitis and myofasciitis.  No involvement of the forefoot or visualized hind foot bony structures.  IMPRESSION:  1.  Large soft tissue abscess involving the medial forefoot with gas formation. 2.  Osteomyelitis involving the first metatarsal and first proximal phalanx. 3.  Diffuse cellulitis and myofasciitis.  Original Report Authenticated By: P. Loralie Champagne, M.D.   Dg Foot 2 Views Left  10/15/2011  *RADIOLOGY REPORT*  Clinical Data: Penetrating wounds to the ball of the left foot, with worsening erythema and blistering.  LEFT  FOOT - 2 VIEW  Comparison: None.  Findings: There is no evidence of fracture or dislocation.  No osseous erosions are seen to suggest osteomyelitis.  The joint spaces are preserved.  There is no evidence of talar subluxation; the subtalar joint is unremarkable in appearance.  Soft tissue swelling and defect are noted along the plantar aspect of the forefoot.  IMPRESSION:  1.  No evidence of fracture or dislocation.  No definite evidence for osteomyelitis, though if there is significant clinical concern for osteomyelitis, MRI could be considered for further evaluation. 2.  Soft tissue defect and swelling along the plantar aspect of the forefoot.  Original Report Authenticated By: Tonia Ghent, M.D.   Dg Foot 2 Views Right  10/15/2011  *RADIOLOGY REPORT*  Clinical Data: Penetrating wounds to the ball of the right foot from blisters, with diffuse erythema and pain.  RIGHT FOOT - 2 VIEW  Comparison: None.  Findings: There is no evidence of fracture or dislocation.  No osseous erosions are seen to suggest osteomyelitis.  The joint spaces are preserved.  There is no evidence of talar subluxation; the subtalar joint is unremarkable in appearance.  Significant diffuse soft tissue air is noted tracking about the first toe  and dorsal to the forefoot, concerning for necrotizing fasciitis.  Underlying soft tissue defects are noted along the plantar aspect of the forefoot.  IMPRESSION:  1.  No evidence of fracture or dislocation.  No definite evidence for osteomyelitis, though if there is significant clinical concern for osteomyelitis, MRI could be considered for further evaluation. 2.  Significant diffuse soft tissue air about the first toe and dorsum of the forefoot, concerning for necrotizing fasciitis.  Findings were discussed with Earley Favor at 01:03 a.m. on 10/14/2011.  Original Report Authenticated By: Tonia Ghent, M.D.   Medications: Scheduled Meds:    . aspirin  325 mg Oral Daily  . diltiazem  120 mg Oral  Daily  . docusate sodium  100 mg Oral BID  . insulin aspart  0-15 Units Subcutaneous TID WC  . insulin aspart  0-5 Units Subcutaneous QHS  . insulin aspart  10 Units Subcutaneous TID WC  . insulin glargine  40 Units Subcutaneous Q0700  . metoprolol tartrate  50 mg Oral BID  . pantoprazole  40 mg Oral Q1200  . piperacillin-tazobactam (ZOSYN)  IV  3.375 g Intravenous Q8H  . rosuvastatin  20 mg Oral q1800  . senna  1 tablet Oral BID  . vancomycin  1,000 mg Intravenous Q12H   Continuous Infusions:    . sodium chloride 75 mL/hr at 10/20/11 1350  . heparin 1,600 Units/hr (10/21/11 0608)   PRN Meds:.acetaminophen, acetaminophen, gadobenate dimeglumine, HYDROmorphone, ondansetron (ZOFRAN) IV, ondansetron, oxyCODONE, polyethylene glycol  Wound culture from 10/14/2011  Specimen Description  WOUND RIGHT POSTERIOR FOOT   Special Requests  NONE   Gram Stain  NO WBC SEEN NO SQUAMOUS EPITHELIAL CELLS SEEN RARE GRAM NEGATIVE RODS   Culture  ABUNDANT PSEUDOMONAS AERUGINOSA   Report Status  PENDING   Organism ID, Bacteria  PSEUDOMONAS AERUGINOSA   Resulting Agency  [facility]    Culture & Susceptibility     PSEUDOMONAS AERUGINOSA          Antibiotic  Sensitivity  Microscan  Status      CEFEPIME  Sensitive  2  Final      Method:  MIC      CEFTAZIDIME  Sensitive  <=1  Final      Method:  MIC      CIPROFLOXACIN  Sensitive  <=0.25  Final      Method:  MIC      GENTAMICIN  Sensitive  2  Final      Method:  MIC      IMIPENEM  Sensitive  <=1  Final      Method:  MIC      TOBRAMYCIN  Sensitive  <=1  Final      Method:  MIC       Comments  PSEUDOMONAS AERUGINOSA (MIC)        ABUNDANT PSEUDOMONAS AERUGINOSA      Assessment/Plan:  - 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 Wound cultures growing pseudomonas,  abscess culture on 10/17/2011 shows Klebsiella oxytoca and enterococcus -  Repeat MRI of the right foot still pending, I reordered stat today, ID following, await MRI results to assess if patient needs more aggressive surgery.  CAD (coronary artery disease), autologous vein bypass graft / s/p 2 stent placement: went into atrial flutter on admission was found to have elevated troponins, was  treated for NSTEMI/ Demand ischemia with IV heparin and IV  Cardizem. His rate was better controlled and IV cardizem was changed to po cardizem. IV  Heparin was held for surgery and restarted after surgery.  - Continue with aspirin/ metoprolol/ crestor.  - On heparin IV, will restart Coumadin after the MRI results to assess if the patient needs any further surgery.  Atrial flutter with NSTEMI/demand ischemia: Followed by cardiology - Continue metoprolol and Cardizem by mouth, on heparin drip.  - DCCV outpatient per Dr. Blanchie Dessert recommendations  Hypertension controlled  Diabetes Mellitus: Still uncontrolled, patient was on metformin prior to admission, he likely needs insulin outpatient -  Continue lantus 40 units, meal coverage and SSI. Continue carb modified diet  DVT prophylaxis: On heparin drip   RAI,RIPUDEEP 10/21/2011, 3:41 PM  Addendum: Reviewed repeat MRI right foot results done today: No evidence of vasculitis but persistent marked cellulitis with rim enhancing fluid collection compatible with abscess extending from the medial aspect of the base of first metatarsal distally to the end of the medial aspect of the foot and laterally to the level of fourth metatarsal. Discussed with Dr. Magnus Ivan (on-call partner for Dr. Dorene Grebe who had originally operated on 10/17/2011). Dr. Magnus Ivan will evaluate patient  And the wound for further recommendations.   RAI,RIPUDEEP 10/21/2011, 4:11 PM

## 2011-10-21 NOTE — Progress Notes (Signed)
Patient ID: [name], male   DOB: 1956-03-20, 55 y.o.   MRN: [id number] I stopped by to look at [name] right foot wound.  The wound/incision actually looks good.  It is held together loosely with sutures and I could express some blood and some serous fluid from the wound but no gross purulence.  There is no cellulitis around this and minimal pain.  I look at the MRI as well and believe what the radiologist is seeing is likely post-surgical changes as well as hematoma.  Given the fact that I could not express any puss from the wound is a good sign.  His WBC have been low, but there is concern about persistent fevers.  I do not think that right now he needs another operation, but I will pass this along to my partner, Dr. August Saucer, who performed the original surgery.  I will have him see the foot on Monday.

## 2011-10-21 NOTE — Progress Notes (Signed)
PT Cancellation Note Attempted to see patient today.  Potassium at 2.9.  RN addressing at this time.   Will hold PT today and return in am. Durenda Hurt. Renaldo Fiddler, [facility] Acute Rehab Services Pager [phone]

## 2011-10-21 NOTE — Consult Note (Signed)
ANTICOAGULATION AND ANTIBIOTIC CONSULT NOTE - FOLLOW UP   Pharmacy Consult for Heparin, Vancomycin, Zosyn Indication: A-fib, Diabetic foot infection  No Known Allergies  Patient Measurements: Height: 5\' 7"  (170.2 cm) Weight: 189 lb 2.5 oz (85.8 kg) IBW/kg (Calculated) : 66.1    Vital Signs: Temp: 99.9 F (37.7 C) (12/08 0509) Temp src: Oral (12/08 0509) BP: 145/77 mmHg (12/08 0509) Pulse Rate: 90  (12/08 0509) Intake/Output from previous day: 12/07 0701 - 12/08 0700 In: 1948.8 [P.O.:840; I.V.:858.8; IV Piggyback:250] Out: 2877 [Urine:2875; Stool:2] Intake/Output from this shift: Total I/O In: 240 [P.O.:240] Out: 300 [Urine:300]  Labs:  Basename 10/21/11 0600 10/20/11 0615 10/19/11 0615 10/19/11 0032  WBC 4.5 4.5 7.6 --  HGB 10.2* 9.4* 9.9* --  HCT 31.4* 29.2* 30.8* --  PLT 123* 116* 113* --  APTT -- -- -- --  CREATININE 1.11 1.10 -- 1.03  LABCREA -- -- -- --  CREATININE 1.11 1.10 -- 1.03  CREAT24HRUR -- -- -- --  MG -- -- -- --  PHOS -- -- -- --  ALBUMIN -- -- -- --  PROT -- -- -- --  ALBUMIN -- -- -- --  AST -- -- -- --  ALT -- -- -- --  ALKPHOS -- -- -- --  BILITOT -- -- -- --  BILIDIR -- -- -- --  IBILI -- -- -- --   HEPARIN UFC 0.50 units/ml VANCOMYCIN TROUGH 17.7 MCG/ML  Estimated Creatinine Clearance: 76.9 ml/min (by C-G formula based on Cr of 1.11).   Microbiology: Recent Results (from the past 720 hour(s))  WOUND CULTURE     Status: Normal   Collection Time   10/14/11 10:29 PM      Component Value Range Status Comment   Specimen Description WOUND RIGHT POSTERIOR FOOT   Final    Special Requests NONE   Final    Gram Stain     Final    Value: NO WBC SEEN     NO SQUAMOUS EPITHELIAL CELLS SEEN     RARE GRAM NEGATIVE RODS   Culture ABUNDANT PSEUDOMONAS AERUGINOSA   Final    Report Status 10/19/2011 FINAL   Final    Organism ID, Bacteria PSEUDOMONAS AERUGINOSA   Final   MRSA PCR SCREENING     Status: Normal   Collection Time   10/15/11  7:07  AM      Component Value Range Status Comment   MRSA by PCR NEGATIVE  NEGATIVE  Final   SURGICAL PCR SCREEN     Status: Normal   Collection Time   10/17/11  7:51 PM      Component Value Range Status Comment   MRSA, PCR NEGATIVE  NEGATIVE  Final    Staphylococcus aureus NEGATIVE  NEGATIVE  Final   CULTURE, ROUTINE-ABSCESS     Status: Normal   Collection Time   10/17/11  9:30 PM      Component Value Range Status Comment   Specimen Description ABSCESS FOOT RIGHT   Final    Special Requests NONE   Final    Gram Stain     Final    Value: ABUNDANT WBC PRESENT,BOTH PMN AND MONONUCLEAR     RARE SQUAMOUS EPITHELIAL CELLS PRESENT     FEW GRAM POSITIVE COCCI IN PAIRS     RARE GRAM NEGATIVE RODS   Culture     Final    Value: FEW KLEBSIELLA OXYTOCA     MODERATE ENTEROCOCCUS SPECIES   Report Status 10/21/2011 FINAL   Final  Organism ID, Bacteria KLEBSIELLA OXYTOCA   Final    Organism ID, Bacteria ENTEROCOCCUS SPECIES   Final   ANAEROBIC CULTURE     Status: Normal (Preliminary result)   Collection Time   10/17/11  9:30 PM      Component Value Range Status Comment   Specimen Description ABSCESS FOOT RIGHT   Final    Special Requests NONE   Final    Gram Stain     Final    Value: ABUNDANT WBC PRESENT,BOTH PMN AND MONONUCLEAR     RARE SQUAMOUS EPITHELIAL CELLS PRESENT     FEW GRAM POSITIVE COCCI IN PAIRS     RARE GRAM NEGATIVE RODS   Culture     Final    Value: NO ANAEROBES ISOLATED; CULTURE IN PROGRESS FOR 5 DAYS   Report Status PENDING   Incomplete     Medications:  Scheduled:    . aspirin  325 mg Oral Daily  . diltiazem  120 mg Oral Daily  . docusate sodium  100 mg Oral BID  . insulin aspart  0-15 Units Subcutaneous TID WC  . insulin aspart  0-5 Units Subcutaneous QHS  . insulin aspart  10 Units Subcutaneous TID WC  . insulin glargine  40 Units Subcutaneous Q0700  . metoprolol tartrate  50 mg Oral BID  . pantoprazole  40 mg Oral Q1200  . piperacillin-tazobactam (ZOSYN)  IV  3.375  g Intravenous Q8H  . rosuvastatin  20 mg Oral q1800  . senna  1 tablet Oral BID  . vancomycin  1,000 mg Intravenous Q12H  . DISCONTD: insulin glargine  30 Units Subcutaneous Q0700   Infusions:    . sodium chloride 75 mL/hr at 10/20/11 1350  . heparin 1,600 Units/hr (10/21/11 0608)   Anti-infectives     Start     Dose/Rate Route Frequency Ordered Stop   10/15/11 1000   vancomycin (VANCOCIN) IVPB 1000 mg/200 mL premix        1,000 mg 200 mL/hr over 60 Minutes Intravenous Every 12 hours 10/15/11 0715     10/15/11 0800   piperacillin-tazobactam (ZOSYN) IVPB 3.375 g        3.375 g 12.5 mL/hr over 240 Minutes Intravenous 3 times per day 10/15/11 0715     10/14/11 2300   vancomycin (VANCOCIN) IVPB 1000 mg/200 mL premix        1,000 mg 200 mL/hr over 60 Minutes Intravenous  Once 10/14/11 2256 10/15/11 0104          Assessment: A-Fib:  Heparin at therapeutic goal Diabetic Foot Infection:  Vancomycin trough level appropriate for Osteo (15 - 20 mcg/ml)  Goal of Therapy:  Heparin level 0.3 - 0.7 units/ml Vancomycin Trough 15 - 20 mcg/ml  Plan:  Continue Vancomycin and Zosyn as ordered. Continue Heparin-same rate.  Velda Shell J 10/21/2011,12:17 PM

## 2011-10-22 LAB — CBC
HCT: 31.7 % — ABNORMAL LOW (ref 39.0–52.0)
MCH: 26.1 pg (ref 26.0–34.0)
MCV: 82.8 fL (ref 78.0–100.0)
RDW: 14.3 % (ref 11.5–15.5)
WBC: 4.8 10*3/uL (ref 4.0–10.5)

## 2011-10-22 LAB — GLUCOSE, CAPILLARY
Glucose-Capillary: 164 mg/dL — ABNORMAL HIGH (ref 70–99)
Glucose-Capillary: 233 mg/dL — ABNORMAL HIGH (ref 70–99)

## 2011-10-22 NOTE — Progress Notes (Signed)
ANTICOAGULATION CONSULT NOTE - Follow Up Consult  Pharmacy Consult for Heparin Indication: atrial fibrillation  No Known Allergies  Patient Measurements: Height: 5\' 7"  (170.2 cm) Weight: 184 lb 15.5 oz (83.9 kg) (bed scale) IBW/kg (Calculated) : 66.1    Vital Signs: Temp: 98.7 F (37.1 C) (12/09 1344) Temp src: Oral (12/09 1344) BP: 104/63 mmHg (12/09 1344) Pulse Rate: 72  (12/09 1344)  Labs:  Basename 10/22/11 0610 10/21/11 0600 10/20/11 0615  HGB 10.0* 10.2* --  HCT 31.7* 31.4* 29.2*  PLT 124* 123* 116*  APTT -- -- --  LABPROT -- -- --  INR -- -- --  HEPARINUNFRC 0.52 0.50 0.34  CREATININE -- 1.11 1.10  CKTOTAL -- -- --  CKMB -- -- --  TROPONINI -- -- --   Estimated Creatinine Clearance: 76 ml/min (by C-G formula based on Cr of 1.11).   Medications:  Scheduled:    . aspirin  325 mg Oral Daily  . diltiazem  120 mg Oral Daily  . docusate sodium  100 mg Oral BID  . insulin aspart  0-15 Units Subcutaneous TID WC  . insulin aspart  0-5 Units Subcutaneous QHS  . insulin aspart  10 Units Subcutaneous TID WC  . insulin glargine  40 Units Subcutaneous Q0700  . metoprolol tartrate  50 mg Oral BID  . pantoprazole  40 mg Oral Q1200  . piperacillin-tazobactam (ZOSYN)  IV  3.375 g Intravenous Q8H  . potassium chloride  10 mEq Intravenous Q1 Hr x 3  . potassium chloride  40 mEq Oral Once  . rosuvastatin  20 mg Oral q1800  . senna  1 tablet Oral BID  . vancomycin  1,000 mg Intravenous Q12H   Infusions:    . sodium chloride 75 mL/hr at 10/22/11 0433  . heparin 1,600 Units/hr (10/22/11 1232)    Assessment: A-Fib: Heparin at therapeutic goal  Goal of Therapy:  Heparin level 0.3-0.7 units/ml   Plan:  Continue Heparin at 1600 units/hr.  Jadyn Barge, Elisha Headland, Pharm.D. 10/22/2011 3:27 PM

## 2011-10-22 NOTE — Progress Notes (Signed)
PT Cancellation Note Patient politely refused PT today - not feeling well and had been up in room this am. Also note patient's K at 5.8. Will return in am for PT. Durenda Hurt Renaldo Fiddler, [facility] Acute Rehab Services Pager [phone]

## 2011-10-22 NOTE — Progress Notes (Signed)
Mr. Coppolino remains rate-controlled in atrial fibrillation. There is no urgent need for cardioversion, especially given ongoing issues with his foot and fevers. I would favor continuing his current rate control and anticoagulation. Would start oral anticoagulation (possibly Xarelto) and continue for 1 month, at which time we could consider outpatient cardioversion. TEE would not be necessary. We will arrange follow-up for him in our office to evaluate for cardioversion after dis  Chrystie Nose, MD Attending Cardiologist [facility]

## 2011-10-22 NOTE — Progress Notes (Signed)
Subjective: This man has no specific complaints today. He has been still getting some fevers but less so. Orthopedics does not feel that he needs surgery at the present time although his primary orthopedic surgeon Dr. August Saucer is going to review him tomorrow.           Physical Exam: Blood pressure 138/76, pulse 73, temperature 100.4 F (38 C), temperature source Oral, resp. rate 19, height 5\' 7"  (1.702 m), weight 83.9 kg (184 lb 15.5 oz), SpO2 93.00%. He looks systemically well. Heart sounds are present and appear to be in atrial fibrillation, rate control. Lung fields are clear. He is alert and orientated. His right foot is bandaged up.   Investigations:  Recent Results (from the past 240 hour(s))  WOUND CULTURE     Status: Normal   Collection Time   10/14/11 10:29 PM      Component Value Range Status Comment   Specimen Description WOUND RIGHT POSTERIOR FOOT   Final    Special Requests NONE   Final    Gram Stain     Final    Value: NO WBC SEEN     NO SQUAMOUS EPITHELIAL CELLS SEEN     RARE GRAM NEGATIVE RODS   Culture ABUNDANT PSEUDOMONAS AERUGINOSA   Final    Report Status 10/19/2011 FINAL   Final    Organism ID, Bacteria PSEUDOMONAS AERUGINOSA   Final   MRSA PCR SCREENING     Status: Normal   Collection Time   10/15/11  7:07 AM      Component Value Range Status Comment   MRSA by PCR NEGATIVE  NEGATIVE  Final   SURGICAL PCR SCREEN     Status: Normal   Collection Time   10/17/11  7:51 PM      Component Value Range Status Comment   MRSA, PCR NEGATIVE  NEGATIVE  Final    Staphylococcus aureus NEGATIVE  NEGATIVE  Final   CULTURE, ROUTINE-ABSCESS     Status: Normal   Collection Time   10/17/11  9:30 PM      Component Value Range Status Comment   Specimen Description ABSCESS FOOT RIGHT   Final    Special Requests NONE   Final    Gram Stain     Final    Value: ABUNDANT WBC PRESENT,BOTH PMN AND MONONUCLEAR     RARE SQUAMOUS EPITHELIAL CELLS PRESENT     FEW GRAM POSITIVE COCCI  IN PAIRS     RARE GRAM NEGATIVE RODS   Culture     Final    Value: FEW KLEBSIELLA OXYTOCA     MODERATE ENTEROCOCCUS SPECIES   Report Status 10/21/2011 FINAL   Final    Organism ID, Bacteria KLEBSIELLA OXYTOCA   Final    Organism ID, Bacteria ENTEROCOCCUS SPECIES   Final   ANAEROBIC CULTURE     Status: Normal (Preliminary result)   Collection Time   10/17/11  9:30 PM      Component Value Range Status Comment   Specimen Description ABSCESS FOOT RIGHT   Final    Special Requests NONE   Final    Gram Stain     Final    Value: ABUNDANT WBC PRESENT,BOTH PMN AND MONONUCLEAR     RARE SQUAMOUS EPITHELIAL CELLS PRESENT     FEW GRAM POSITIVE COCCI IN PAIRS     RARE GRAM NEGATIVE RODS   Culture     Final    Value: NO ANAEROBES ISOLATED; CULTURE IN PROGRESS FOR 5 DAYS  Report Status PENDING   Incomplete      Basic Metabolic Panel:  Basename 10/21/11 2039 10/21/11 0600 10/20/11 0615  NA -- 134* 136  K 5.8* 2.9* --  CL -- 95* 98  CO2 -- 32 32  GLUCOSE -- 198* 231*  BUN -- 8 7  CREATININE -- 1.11 1.10  CALCIUM -- 7.7* 7.6*  MG -- -- --  PHOS -- -- --       CBC:  Basename 10/22/11 0610 10/21/11 0600  WBC 4.8 4.5  NEUTROABS -- --  HGB 10.0* 10.2*  HCT 31.7* 31.4*  MCV 82.8 81.6  PLT 124* 123*    Mr Foot Right W Wo Contrast  10/21/2011  *RADIOLOGY REPORT*  Clinical Data: Osteomyelitis.  MRI OF THE RIGHT FOREFOOT WITHOUT AND WITH CONTRAST  Technique:  Multiplanar, multisequence MR imaging was performed both before and after administration of intravenous contrast.  Contrast: 15mL MULTIHANCE GADOBENATE DIMEGLUMINE 529 MG/ML IV SOLN  Comparison: MRI 10/15/2011.  Findings: Since the prior examination, the patient has undergone amputation at the level of the proximal diaphysis of the first and second metatarsals.  Soft tissues demonstrate extensive edema and enhancement.  There appears be a soft tissue wound over the dorsum of the foot.  There is a rim enhancing fluid collection  originating from the medial margin of the residual base of the first metatarsal extending distally to the end of the medial aspect of the foot.  The collection also extends over the dorsum of the foot at the level of the mid metatarsals to the level of the fourth metatarsal.  The collection cannot be discretely measured but is approximately 4.7 cm long by up to 2.8 cm cranial-caudal by 2.5 cm transverse.  IMPRESSION:  1.  Status post amputation at the level the bases of the first and second metatarsals.  No evidence of osteomyelitis is identified. 2.  Persistent marked cellulitis with a rim enhancing fluid collection compatible with abscess.  As noted above, the collection extends from approximately the medial aspect of the base of the first metatarsal distally to the end of the medial aspect of the foot.  At the level of the mid metatarsals, the collection extends over the dorsum of the foot laterally to the level of the fourth metatarsal.  Original Report Authenticated By: Bernadene Bell. Maricela Curet, M.D.      Medications: I have reviewed the patient's current medications.  Impression: 1. Probable osteomyelitis of the right foot, wound cultures growing Pseudomonas. 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 Still spiking some fevers although less. 2. Coronary artery disease, stable. 3. Atrial fibrillation, rate controlled. On heparin drip. 4. Hypertension. 5. Diabetes mellitus.     Plan: 1. Continue current treatment with antibiotics. 2. Continue intravenous heparin. I'm not keen to changing him to oral anticoagulation for the time being until he is reviewed by Dr. August Saucer. 3. Monitor diabetes as it seems to be somewhat suboptimally controlled today.     LOS: 8 days   Auburn Hester C 10/22/2011, 12:44 PM

## 2011-10-23 LAB — HEPARIN LEVEL (UNFRACTIONATED): Heparin Unfractionated: 0.75 IU/mL — ABNORMAL HIGH (ref 0.30–0.70)

## 2011-10-23 LAB — HEPATITIS PANEL, ACUTE
HCV Ab: NEGATIVE
Hep A IgM: NEGATIVE
Hepatitis B Surface Ag: NEGATIVE

## 2011-10-23 LAB — CBC
MCH: 26.2 pg (ref 26.0–34.0)
MCHC: 31.8 g/dL (ref 30.0–36.0)
Platelets: 62 10*3/uL — ABNORMAL LOW (ref 150–400)
RBC: 4.09 MIL/uL — ABNORMAL LOW (ref 4.22–5.81)

## 2011-10-23 LAB — GLUCOSE, CAPILLARY
Glucose-Capillary: 135 mg/dL — ABNORMAL HIGH (ref 70–99)
Glucose-Capillary: 121 mg/dL — ABNORMAL HIGH (ref 70–99)
Glucose-Capillary: 42 mg/dL — CL (ref 70–99)
Glucose-Capillary: 80 mg/dL (ref 70–99)

## 2011-10-23 LAB — COMPREHENSIVE METABOLIC PANEL
ALT: 15 U/L (ref 0–53)
AST: 30 U/L (ref 0–37)
Calcium: 8.1 mg/dL — ABNORMAL LOW (ref 8.4–10.5)
Sodium: 139 mEq/L (ref 135–145)
Total Protein: 6.7 g/dL (ref 6.0–8.3)

## 2011-10-23 MED ORDER — INSULIN GLARGINE 100 UNIT/ML ~~LOC~~ SOLN
30.0000 [IU] | SUBCUTANEOUS | Status: DC
  Administered 2011-10-24 – 2011-10-31 (×8): 30 [IU] via SUBCUTANEOUS
  Filled 2011-10-23 (×2): qty 3

## 2011-10-23 MED ORDER — LORAZEPAM 1 MG PO TABS
1.0000 mg | ORAL_TABLET | Freq: Once | ORAL | Status: AC
  Administered 2011-10-23: 1 mg via ORAL

## 2011-10-23 NOTE — Progress Notes (Signed)
ANTIBIOTIC/Anticoagulation CONSULT NOTE - FOLLOW UP  Pharmacy Consult for Heparin for afib; Vanc/zosyn for Diabetic foot Infection Indication:  No Known Allergies  Patient Measurements: Height: 5\' 7"  (170.2 cm) Weight: 192 lb 6.4 oz (87.272 kg) (scale c) IBW/kg (Calculated) : 66.1    Vital Signs: Temp: 100.2 F (37.9 C) (12/10 0713) Temp src: Oral (12/09 2200) BP: 170/51 mmHg (12/10 0713) Pulse Rate: 99  (12/10 0713) Intake/Output from previous day: 12/09 0701 - 12/10 0700 In: 2232.3 [P.O.:940; I.V.:792.3; IV Piggyback:500] Out: 2000 [Urine:2000] Intake/Output from this shift: Total I/O In: 240 [P.O.:240] Out: 875 [Urine:875]  Labs:  [facility] 10/23/11 0540 10/22/11 0610 10/21/11 0600  WBC 5.7 4.8 4.5  HGB 10.7* 10.0* 10.2*  PLT 62* 124* 123*  LABCREA -- -- --  CREATININE 1.30 -- 1.11   Estimated Creatinine Clearance: 66.2 ml/min (by C-G formula based on Cr of 1.3).  Heparin level 0.75 Plt=62  Basename 10/21/11 1030  VANCOTROUGH 17.7  VANCOPEAK --  VANCORANDOM --  GENTTROUGH --  GENTPEAK --  GENTRANDOM --  TOBRATROUGH --  TOBRAPEAK --  TOBRARND --  AMIKACINPEAK --  AMIKACINTROU --  AMIKACIN --     Microbiology: Recent Results (from the past 720 hour(s))  WOUND CULTURE     Status: Normal   Collection Time   10/14/11 10:29 PM      Component Value Range Status Comment   Specimen Description WOUND RIGHT POSTERIOR FOOT   Final    Special Requests NONE   Final    Gram Stain     Final    Value: NO WBC SEEN     NO SQUAMOUS EPITHELIAL CELLS SEEN     RARE GRAM NEGATIVE RODS   Culture ABUNDANT PSEUDOMONAS AERUGINOSA   Final    Report Status 10/19/2011 FINAL   Final    Organism ID, Bacteria PSEUDOMONAS AERUGINOSA   Final   MRSA PCR SCREENING     Status: Normal   Collection Time   10/15/11  7:07 AM      Component Value Range Status Comment   MRSA by PCR NEGATIVE  NEGATIVE  Final   SURGICAL PCR SCREEN     Status: Normal   Collection Time   10/17/11  7:51 PM       Component Value Range Status Comment   MRSA, PCR NEGATIVE  NEGATIVE  Final    Staphylococcus aureus NEGATIVE  NEGATIVE  Final   CULTURE, ROUTINE-ABSCESS     Status: Normal   Collection Time   10/17/11  9:30 PM      Component Value Range Status Comment   Specimen Description ABSCESS FOOT RIGHT   Final    Special Requests NONE   Final    Gram Stain     Final    Value: ABUNDANT WBC PRESENT,BOTH PMN AND MONONUCLEAR     RARE SQUAMOUS EPITHELIAL CELLS PRESENT     FEW GRAM POSITIVE COCCI IN PAIRS     RARE GRAM NEGATIVE RODS   Culture     Final    Value: FEW KLEBSIELLA OXYTOCA     MODERATE ENTEROCOCCUS SPECIES   Report Status 10/21/2011 FINAL   Final    Organism ID, Bacteria KLEBSIELLA OXYTOCA   Final    Organism ID, Bacteria ENTEROCOCCUS SPECIES   Final   ANAEROBIC CULTURE     Status: Normal (Preliminary result)   Collection Time   10/17/11  9:30 PM      Component Value Range Status Comment   Specimen Description ABSCESS FOOT  RIGHT   Final    Special Requests NONE   Final    Gram Stain     Final    Value: ABUNDANT WBC PRESENT,BOTH PMN AND MONONUCLEAR     RARE SQUAMOUS EPITHELIAL CELLS PRESENT     FEW GRAM POSITIVE COCCI IN PAIRS     RARE GRAM NEGATIVE RODS   Culture     Final    Value: NO ANAEROBES ISOLATED; CULTURE IN PROGRESS FOR 5 DAYS   Report Status PENDING   Incomplete     Anti-infectives     Start     Dose/Rate Route Frequency Ordered Stop   10/15/11 1000   vancomycin (VANCOCIN) IVPB 1000 mg/200 mL premix        1,000 mg 200 mL/hr over 60 Minutes Intravenous Every 12 hours 10/15/11 0715     10/15/11 0800  piperacillin-tazobactam (ZOSYN) IVPB 3.375 g       3.375 g 12.5 mL/hr over 240 Minutes Intravenous 3 times per day 10/15/11 0715     10/14/11 2300   vancomycin (VANCOCIN) IVPB 1000 mg/200 mL premix        1,000 mg 200 mL/hr over 60 Minutes Intravenous  Once 10/14/11 2256 10/15/11 0104          Assessment: Afib-slightly supratherapeutic on IV heparin gtt  with HL of 0.75. No bleeding has been noted, but did see a significant decrease in plt count overnight(down to 62) currently on day 7 of heparin therapy. Plt have remained low during admission but not to this extent. 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 Tmax 100.9 overnight. Did see an increase in scr to 1.3 today. Last vanc trough on 12/8 was within goal. Will watch renal function close for need of another trough.  Goal of Therapy:  Vancomycin trough level 15-20 mcg/ml Heparin level 0.3-0.7 Plan:  1.Continue zosyn 3.375g IV q8 hours 2.Continue vancomycin 1g q12hours 3.Decrease IV heparin to 1450 units/hr 4.Daily Coags 5.F/u long term anticoagulation plan  Severiano Gilbert 10/23/2011,9:22 AM

## 2011-10-23 NOTE — Significant Event (Signed)
Noticed during assessment patient's dressing on surgical right foot was saturated with blood. Pt. also had temp of 101.9. Notified Dr. Jae Dire of situation. Dr. Betti Cruz came to evaluate, change dressing, and ordered tylenol 650mg  oral for 101.9 temp. Also gave pain med of oxycodone (oxy IR/Roxicodone) IR 5mg  post-op dressing change. Will continue to monitor patient.

## 2011-10-23 NOTE — Progress Notes (Signed)
Pts blood sugar is 49.  Pt given orange juice, crackers, and peanut butter. Will recheck in 15-20 min. Dr.Rai notified.

## 2011-10-23 NOTE — Progress Notes (Signed)
Subjective: - depressed because his father's dog is missing, otherwise no complaints   Objective:  Weight change:   Intake/Output Summary (Last 24 hours) at 10/23/11 1908 Last data filed at 10/23/11 1715  Gross per 24 hour  Intake   1998 ml  Output   3150 ml  Net  -1152 ml   Blood pressure 167/75, pulse 97, temperature 100.1 F (37.8 C), temperature source Oral, resp. rate 19, height 5\' 7"  (1.702 m), weight 87.272 kg (192 lb 6.4 oz), SpO2 94.00%.  On Exam alert, comfortable.   Gen: Alert and oriented x3 not in any distress Heart:S1S2 Irreg, irreg.  Lungs:CTAB, no wheezing or rhonchi. ABD: soft non tender , bowel sounds present. Extremities. Right foot dressing intact  Neuro: Alert and oriented x3 no focal neurological deficits noted   Lab Results: Results for orders placed during the hospital encounter of 10/14/11 (from the past 24 hour(s))  GLUCOSE, CAPILLARY     Status: Abnormal   Collection Time   10/22/11  9:25 PM      Component Value Range   Glucose-Capillary 143 (*) 70 - 99 (mg/dL)  HEPARIN LEVEL (UNFRACTIONATED)     Status: Abnormal   Collection Time   10/23/11  5:40 AM      Component Value Range   Heparin Unfractionated 0.75 (*) 0.30 - 0.70 (IU/mL)  CBC     Status: Abnormal   Collection Time   10/23/11  5:40 AM      Component Value Range   WBC 5.7  4.0 - 10.5 (K/uL)   RBC 4.09 (*) 4.22 - 5.81 (MIL/uL)   Hemoglobin 10.7 (*) 13.0 - 17.0 (g/dL)   HCT 40.9 (*) 81.1 - 52.0 (%)   MCV 82.4  78.0 - 100.0 (fL)   MCH 26.2  26.0 - 34.0 (pg)   MCHC 31.8  30.0 - 36.0 (g/dL)   RDW 91.4  78.2 - 95.6 (%)   Platelets 62 (*) 150 - 400 (K/uL)  COMPREHENSIVE METABOLIC PANEL     Status: Abnormal   Collection Time   10/23/11  5:40 AM      Component Value Range   Sodium 139  135 - 145 (mEq/L)   Potassium 3.7  3.5 - 5.1 (mEq/L)   Chloride 98  96 - 112 (mEq/L)   CO2 33 (*) 19 - 32 (mEq/L)   Glucose, Bld 151 (*) 70 - 99 (mg/dL)   BUN 10  6 - 23 (mg/dL)   Creatinine, Ser  2.13  0.50 - 1.35 (mg/dL)   Calcium 8.1 (*) 8.4 - 10.5 (mg/dL)   Total Protein 6.7  6.0 - 8.3 (g/dL)   Albumin 1.8 (*) 3.5 - 5.2 (g/dL)   AST 30  0 - 37 (U/L)   ALT 15  0 - 53 (U/L)   Alkaline Phosphatase 64  39 - 117 (U/L)   Total Bilirubin 0.3  0.3 - 1.2 (mg/dL)   GFR calc non Af Amer 59 (*) >90 (mL/min)   GFR calc Af Amer 69 (*) >90 (mL/min)  GLUCOSE, CAPILLARY     Status: Abnormal   Collection Time   10/23/11  6:55 AM      Component Value Range   Glucose-Capillary 135 (*) 70 - 99 (mg/dL)  GLUCOSE, CAPILLARY     Status: Abnormal   Collection Time   10/23/11 11:14 AM      Component Value Range   Glucose-Capillary 121 (*) 70 - 99 (mg/dL)   Comment 1 Notify RN     Comment  2 Documented in Chart    GLUCOSE, CAPILLARY     Status: Abnormal   Collection Time   10/23/11  4:17 PM      Component Value Range   Glucose-Capillary 42 (*) 70 - 99 (mg/dL)   Comment 1 Notify RN    GLUCOSE, CAPILLARY     Status: Abnormal   Collection Time   10/23/11  4:48 PM      Component Value Range   Glucose-Capillary 61 (*) 70 - 99 (mg/dL)   Comment 1 Repeat Test    GLUCOSE, CAPILLARY     Status: Abnormal   Collection Time   10/23/11  4:52 PM      Component Value Range   Glucose-Capillary 64 (*) 70 - 99 (mg/dL)  GLUCOSE, CAPILLARY     Status: Normal   Collection Time   10/23/11  5:34 PM      Component Value Range   Glucose-Capillary 80  70 - 99 (mg/dL)     Micro Results: Recent Results (from the past 240 hour(s))  WOUND CULTURE     Status: Normal   Collection Time   10/14/11 10:29 PM      Component Value Range Status Comment   Specimen Description WOUND RIGHT POSTERIOR FOOT   Final    Special Requests NONE   Final    Gram Stain     Final    Value: NO WBC SEEN     NO SQUAMOUS EPITHELIAL CELLS SEEN     RARE GRAM NEGATIVE RODS   Culture ABUNDANT PSEUDOMONAS AERUGINOSA   Final    Report Status 10/19/2011 FINAL   Final    Organism ID, Bacteria PSEUDOMONAS AERUGINOSA   Final   MRSA PCR  SCREENING     Status: Normal   Collection Time   10/15/11  7:07 AM      Component Value Range Status Comment   MRSA by PCR NEGATIVE  NEGATIVE  Final   SURGICAL PCR SCREEN     Status: Normal   Collection Time   10/17/11  7:51 PM      Component Value Range Status Comment   MRSA, PCR NEGATIVE  NEGATIVE  Final    Staphylococcus aureus NEGATIVE  NEGATIVE  Final   CULTURE, ROUTINE-ABSCESS     Status: Normal   Collection Time   10/17/11  9:30 PM      Component Value Range Status Comment   Specimen Description ABSCESS FOOT RIGHT   Final    Special Requests NONE   Final    Gram Stain     Final    Value: ABUNDANT WBC PRESENT,BOTH PMN AND MONONUCLEAR     RARE SQUAMOUS EPITHELIAL CELLS PRESENT     FEW GRAM POSITIVE COCCI IN PAIRS     RARE GRAM NEGATIVE RODS   Culture     Final    Value: FEW KLEBSIELLA OXYTOCA     MODERATE ENTEROCOCCUS SPECIES   Report Status 10/21/2011 FINAL   Final    Organism ID, Bacteria KLEBSIELLA OXYTOCA   Final    Organism ID, Bacteria ENTEROCOCCUS SPECIES   Final   ANAEROBIC CULTURE     Status: Normal (Preliminary result)   Collection Time   10/17/11  9:30 PM      Component Value Range Status Comment   Specimen Description ABSCESS FOOT RIGHT   Final    Special Requests NONE   Final    Gram Stain     Final    Value: ABUNDANT WBC PRESENT,BOTH  PMN AND MONONUCLEAR     RARE SQUAMOUS EPITHELIAL CELLS PRESENT     FEW GRAM POSITIVE COCCI IN PAIRS     RARE GRAM NEGATIVE RODS   Culture     Final    Value: NO ANAEROBES ISOLATED; CULTURE IN PROGRESS FOR 5 DAYS   Report Status PENDING   Incomplete     Studies/Results: Ct Angio Chest W/cm &/or Wo Cm  10/15/2011  *RADIOLOGY REPORT*  Clinical Data:  Chest pain with shortness of breath and elevated D- dimer levels.  Diabetes.  Question pulmonary embolism.  CT ANGIOGRAPHY CHEST WITH CONTRAST  Technique:  Multidetector CT imaging of the chest was performed using the standard protocol during bolus administration of intravenous  contrast.  Multiplanar CT image reconstructions including MIPs were obtained to evaluate the vascular anatomy.  Contrast: OMNIPAQUE IOHEXOL 300 MG/ML IV SOLN  Comparison:  None.  Findings:  The pulmonary arteries are well opacified with contrast. There is no evidence of acute pulmonary embolism.  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  The heart is mildly enlarged.  No enlarged mediastinal or hilar lymph nodes are demonstrated.  A pretracheal node demonstrates a fatty hilum.  There is no pleural or pericardial effusion.  The lungs are clear.  Images through the upper abdomen demonstrate contour irregularity of the liver suspicious for cirrhosis.  The spleen appears mildly enlarged.  There are multiple small calcified gallstones.  Review of the MIP images confirms the above findings.  IMPRESSION:  1.  No evidence of acute pulmonary embolism or other acute chest process. 2.  Cholelithiasis. 3.  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  Correlate clinically. 4.  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  Original Report Authenticated By: Gerrianne Scale, M.D.   Mr Foot Right W Wo Contrast  10/15/2011  *RADIOLOGY REPORT*  Clinical Data: Infected diabetic foot ulcer.  MRI OF THE RIGHT FOREFOOT WITHOUT AND WITH CONTRAST  Technique:  Multiplanar, multisequence MR imaging was performed both before and after administration of intravenous contrast.  Contrast: 20mL MULTIHANCE GADOBENATE DIMEGLUMINE 529 MG/ML IV SOLN  Comparison: Right foot radiographs.  Findings: There is a large soft tissue abscess involving the medial aspect of the forefoot it is largely between the first and second toe is extending along the dorsum of the first metatarsal.  As noted on the plain films there is extensive gas formation in the soft tissues.  No obvious changes of septic arthritis but there are findings for osteomyelitis involving the first metatarsal and first proximal phalanx.  There is associated diffuse  cellulitis and myofasciitis.  No involvement of the forefoot or visualized hind foot bony structures.  IMPRESSION:  1.  Large soft tissue abscess involving the medial forefoot with gas formation. 2.  Osteomyelitis involving the first metatarsal and first proximal phalanx. 3.  Diffuse cellulitis and myofasciitis.  Original Report Authenticated By: P. Loralie Champagne, M.D.   Dg Foot 2 Views Left  10/15/2011  *RADIOLOGY REPORT*  Clinical Data: Penetrating wounds to the ball of the left foot, with worsening erythema and blistering.  LEFT FOOT - 2 VIEW  Comparison: None.  Findings: There is no evidence of fracture or dislocation.  No osseous erosions are seen to suggest osteomyelitis.  The joint spaces are preserved.  There is no evidence of talar subluxation; the subtalar joint is unremarkable in appearance.  Soft tissue swelling and defect are noted along the plantar aspect of the forefoot.  IMPRESSION:  1.  No evidence of fracture or dislocation.  No definite evidence for osteomyelitis, though if there is significant clinical concern for osteomyelitis, MRI could be considered for further evaluation. 2.  Soft tissue defect and swelling along the plantar aspect of the forefoot.  Original Report Authenticated By: Tonia Ghent, M.D.   Dg Foot 2 Views Right  10/15/2011  *RADIOLOGY REPORT*  Clinical Data: Penetrating wounds to the ball of the right foot from blisters, with diffuse erythema and pain.  RIGHT FOOT - 2 VIEW  Comparison: None.  Findings: There is no evidence of fracture or dislocation.  No osseous erosions are seen to suggest osteomyelitis.  The joint spaces are preserved.  There is no evidence of talar subluxation; the subtalar joint is unremarkable in appearance.  Significant diffuse soft tissue air is noted tracking about the first toe and dorsal to the forefoot, concerning for necrotizing fasciitis.  Underlying soft tissue defects are noted along the plantar aspect of the forefoot.  IMPRESSION:  1.  No  evidence of fracture or dislocation.  No definite evidence for osteomyelitis, though if there is significant clinical concern for osteomyelitis, MRI could be considered for further evaluation. 2.  Significant diffuse soft tissue air about the first toe and dorsum of the forefoot, concerning for necrotizing fasciitis.  Findings were discussed with Earley Favor at 01:03 a.m. on 10/14/2011.  Original Report Authenticated By: Tonia Ghent, M.D.   Medications: Scheduled Meds:    . aspirin  325 mg Oral Daily  . diltiazem  120 mg Oral Daily  . docusate sodium  100 mg Oral BID  . insulin aspart  0-15 Units Subcutaneous TID WC  . insulin aspart  0-5 Units Subcutaneous QHS  . insulin glargine  30 Units Subcutaneous Q0700  . LORazepam  1 mg Oral Once  . metoprolol tartrate  50 mg Oral BID  . pantoprazole  40 mg Oral Q1200  . piperacillin-tazobactam (ZOSYN)  IV  3.375 g Intravenous Q8H  . rosuvastatin  20 mg Oral q1800  . senna  1 tablet Oral BID  . DISCONTD: insulin aspart  10 Units Subcutaneous TID WC  . DISCONTD: insulin glargine  40 Units Subcutaneous Q0700  . DISCONTD: vancomycin  1,000 mg Intravenous Q12H   Continuous Infusions:    . sodium chloride 75 mL/hr at 10/23/11 1904  . heparin 1,450 Units/hr (10/23/11 1904)   PRN Meds:.acetaminophen, acetaminophen, HYDROmorphone, LORazepam, ondansetron (ZOFRAN) IV, ondansetron, oxyCODONE, polyethylene glycol  Wound culture from 10/14/2011  Specimen Description  WOUND RIGHT POSTERIOR FOOT   Special Requests  NONE   Gram Stain  NO WBC SEEN NO SQUAMOUS EPITHELIAL CELLS SEEN RARE GRAM NEGATIVE RODS   Culture  ABUNDANT PSEUDOMONAS AERUGINOSA   Report Status  PENDING   Organism ID, Bacteria  PSEUDOMONAS AERUGINOSA   Resulting Agency  [facility]    Culture & Susceptibility     PSEUDOMONAS AERUGINOSA          Antibiotic  Sensitivity  Microscan  Status      CEFEPIME  Sensitive  2  Final      Method:  MIC      CEFTAZIDIME  Sensitive  <=1  Final       Method:  MIC      CIPROFLOXACIN  Sensitive  <=0.25  Final      Method:  MIC      GENTAMICIN  Sensitive  2  Final      Method:  MIC      IMIPENEM  Sensitive  <=1  Final  Method:  MIC      TOBRAMYCIN  Sensitive  <=1  Final      Method:  MIC       Comments  PSEUDOMONAS AERUGINOSA (MIC)        ABUNDANT PSEUDOMONAS AERUGINOSA      Assessment/Plan:  - 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 Wound cultures growing pseudomonas,  abscess culture on 10/17/2011 shows Klebsiella oxytoca and enterococcus - ID following, noted recommendations, awaiting Dr Diamantina Providence assessment   CAD (coronary artery disease), autologous vein bypass graft / s/p 2 stent placement: went into atrial flutter on admission was found to have elevated troponins, was  treated for NSTEMI/ Demand ischemia with IV heparin and IV Cardizem. His rate was better controlled and IV cardizem was changed to po cardizem. IV  Heparin was held for surgery and restarted after surgery.  - Continue with aspirin/ metoprolol/ crestor.  - Per cardiology, place on xarelto for a month. Currently on heparin IV, await Dr. Diamantina Providence recommendations. If no OR, will DC heparin gtt and start xarelto  Atrial flutter with NSTEMI/demand ischemia: Followed by cardiology - Continue metoprolol and Cardizem by mouth, on heparin drip, start xarelto .  - DCCV outpatient per Dr. Blanchie Dessert recommendations  Hypertension controlled  Diabetes Mellitus/hypoglycemia: Still uncontrolled, patient was on metformin prior to admission, he likely needs insulin outpatient -  Dec Lantus to 30units, DC meal coverage, cont SSI. Continue carb modified diet  DVT prophylaxis: On heparin drip   RAI,RIPUDEEP 10/23/2011, 7:08 PM

## 2011-10-23 NOTE — Progress Notes (Signed)
Subjective: No new complaints  Objective: Weight change:   Intake/Output Summary (Last 24 hours) at 10/23/11 1424 Last data filed at 10/23/11 1314  Gross per 24 hour  Intake 2112.27 ml  Output   2400 ml  Net -287.73 ml   Blood pressure 113/69, pulse 98, temperature 100.2 F (37.9 C), temperature source Oral, resp. rate 18, height 5\' 7"  (1.702 m), weight 192 lb 6.4 oz (87.272 kg), SpO2 95.00%. Temp:  [100.2 F (37.9 C)-100.9 F (38.3 C)] 100.2 F (37.9 C) (12/10 0713) Pulse Rate:  [87-99] 98  (12/10 0950) Resp:  [18] 18  (12/10 0713) BP: (113-170)/(51-74) 113/69 mmHg (12/10 0948) SpO2:  [92 %-95 %] 95 % (12/10 0713) Weight:  [192 lb 6.4 oz (87.272 kg)] 192 lb 6.4 oz (87.272 kg) (12/10 1610)  Physical Exam: General: Alert and awake, oriented x3, not in any acute distress. HEENT: anicteric sclera, pupils reactive to light and accommodation, EOMI CVS regular rate, normal r,  no murmur rubs or gallops Chest: clear to auscultation bilaterally, no wheezing, rales or rhonchi Abdomen: soft nontender, nondistended, normal bowel sounds, Extremities: his left ankle with dressing  Lymph: no new lymphadenopathy Neuro: nonfocal  Lab Results:  Basename 10/23/11 0540 10/22/11 0610  WBC 5.7 4.8  HGB 10.7* 10.0*  HCT 33.7* 31.7*  PLT 62* 124*   BMET  Basename 10/23/11 0540 10/21/11 2039 10/21/11 0600  NA 139 -- 134*  K 3.7 5.8* --  CL 98 -- 95*  CO2 33* -- 32  GLUCOSE 151* -- 198*  BUN 10 -- 8  CREATININE 1.30 -- 1.11  CALCIUM 8.1* -- 7.7*    Micro Results: Recent Results (from the past 240 hour(s))  WOUND CULTURE     Status: Normal   Collection Time   10/14/11 10:29 PM      Component Value Range Status Comment   Specimen Description WOUND RIGHT POSTERIOR FOOT   Final    Special Requests NONE   Final    Gram Stain     Final    Value: NO WBC SEEN     NO SQUAMOUS EPITHELIAL CELLS SEEN     RARE GRAM NEGATIVE RODS   Culture ABUNDANT PSEUDOMONAS AERUGINOSA   Final    Report Status 10/19/2011 FINAL   Final    Organism ID, Bacteria PSEUDOMONAS AERUGINOSA   Final   MRSA PCR SCREENING     Status: Normal   Collection Time   10/15/11  7:07 AM      Component Value Range Status Comment   MRSA by PCR NEGATIVE  NEGATIVE  Final   SURGICAL PCR SCREEN     Status: Normal   Collection Time   10/17/11  7:51 PM      Component Value Range Status Comment   MRSA, PCR NEGATIVE  NEGATIVE  Final    Staphylococcus aureus NEGATIVE  NEGATIVE  Final   CULTURE, ROUTINE-ABSCESS     Status: Normal   Collection Time   10/17/11  9:30 PM      Component Value Range Status Comment   Specimen Description ABSCESS FOOT RIGHT   Final    Special Requests NONE   Final    Gram Stain     Final    Value: ABUNDANT WBC PRESENT,BOTH PMN AND MONONUCLEAR     RARE SQUAMOUS EPITHELIAL CELLS PRESENT     FEW GRAM POSITIVE COCCI IN PAIRS     RARE GRAM NEGATIVE RODS   Culture     Final    Value:  FEW KLEBSIELLA OXYTOCA     MODERATE ENTEROCOCCUS SPECIES   Report Status 10/21/2011 FINAL   Final    Organism ID, Bacteria KLEBSIELLA OXYTOCA   Final    Organism ID, Bacteria ENTEROCOCCUS SPECIES   Final   ANAEROBIC CULTURE     Status: Normal (Preliminary result)   Collection Time   10/17/11  9:30 PM      Component Value Range Status Comment   Specimen Description ABSCESS FOOT RIGHT   Final    Special Requests NONE   Final    Gram Stain     Final    Value: ABUNDANT WBC PRESENT,BOTH PMN AND MONONUCLEAR     RARE SQUAMOUS EPITHELIAL CELLS PRESENT     FEW GRAM POSITIVE COCCI IN PAIRS     RARE GRAM NEGATIVE RODS   Culture     Final    Value: NO ANAEROBES ISOLATED; CULTURE IN PROGRESS FOR 5 DAYS   Report Status PENDING   Incomplete     Studies/Results: Ct Angio Chest W/cm &/or Wo Cm  10/15/2011  *RADIOLOGY REPORT*  Clinical Data:  Chest pain with shortness of breath and elevated D- dimer levels.  Diabetes.  Question pulmonary embolism.  CT ANGIOGRAPHY CHEST WITH CONTRAST  Technique:  Multidetector CT  imaging of the chest was performed using the standard protocol during bolus administration of intravenous contrast.  Multiplanar CT image reconstructions including MIPs were obtained to evaluate the vascular anatomy.  Contrast: OMNIPAQUE IOHEXOL 300 MG/ML IV SOLN  Comparison:  None.  Findings:  The pulmonary arteries are well opacified with contrast. There is no evidence of acute pulmonary embolism.  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  The heart is mildly enlarged.  No enlarged mediastinal or hilar lymph nodes are demonstrated.  A pretracheal node demonstrates a fatty hilum.  There is no pleural or pericardial effusion.  The lungs are clear.  Images through the upper abdomen demonstrate contour irregularity of the liver suspicious for cirrhosis.  The spleen appears mildly enlarged.  There are multiple small calcified gallstones.  Review of the MIP images confirms the above findings.  IMPRESSION:  1.  No evidence of acute pulmonary embolism or other acute chest process. 2.  Cholelithiasis. 3.  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  Correlate clinically. 4.  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  Original Report Authenticated By: Gerrianne Scale, M.D.   MRI:  Comparison: MRI 10/15/2011.  Findings: Since the prior examination, the patient has undergone  amputation at the level of the proximal diaphysis of the first and  second metatarsals.  Soft tissues demonstrate extensive edema and enhancement. There  appears be a soft tissue wound over the dorsum of the foot. There  is a rim enhancing fluid collection originating from the medial  margin of the residual base of the first metatarsal extending  distally to the end of the medial aspect of the foot. The  collection also extends over the dorsum of the foot at the level of  the mid metatarsals to the level of the fourth metatarsal. The  collection cannot be discretely measured but is approximately 4.7  cm long by up  to 2.8 cm cranial-caudal by 2.5 cm transverse.  IMPRESSION:  1. Status post amputation at the level the bases of the first and  second metatarsals. No evidence of osteomyelitis is identified.  2. Persistent marked cellulitis with a rim enhancing fluid  collection compatible with abscess. As noted above, the collection  extends from approximately the  medial aspect of the base of the  first metatarsal distally to the end of the medial aspect of the  foot. At the level of the mid metatarsals, the collection extends  over the dorsum of the foot laterally to the level of the fourth  metatarsal.   10/15/2011  *RADIOLOGY REPORT*  Clinical Data: Infected diabetic foot ulcer.  MRI OF THE RIGHT FOREFOOT WITHOUT AND WITH CONTRAST  Technique:  Multiplanar, multisequence MR imaging was performed both before and after administration of intravenous contrast.  Contrast: 20mL MULTIHANCE GADOBENATE DIMEGLUMINE 529 MG/ML IV SOLN  Comparison: Right foot radiographs.  Findings: There is a large soft tissue abscess involving the medial aspect of the forefoot it is largely between the first and second toe is extending along the dorsum of the first metatarsal.  As noted on the plain films there is extensive gas formation in the soft tissues.  No obvious changes of septic arthritis but there are findings for osteomyelitis involving the first metatarsal and first proximal phalanx.  There is associated diffuse cellulitis and myofasciitis.  No involvement of the forefoot or visualized hind foot bony structures.  IMPRESSION:  1.  Large soft tissue abscess involving the medial forefoot with gas formation. 2.  Osteomyelitis involving the first metatarsal and first proximal phalanx. 3.  Diffuse cellulitis and myofasciitis.  Original Report Authenticated By: P. Loralie Champagne, M.D.   Dg Foot 2 Views Left  10/15/2011  *RADIOLOGY REPORT*  Clinical Data: Penetrating wounds to the ball of the left foot, with worsening erythema and  blistering.  LEFT FOOT - 2 VIEW  Comparison: None.  Findings: There is no evidence of fracture or dislocation.  No osseous erosions are seen to suggest osteomyelitis.  The joint spaces are preserved.  There is no evidence of talar subluxation; the subtalar joint is unremarkable in appearance.  Soft tissue swelling and defect are noted along the plantar aspect of the forefoot.  IMPRESSION:  1.  No evidence of fracture or dislocation.  No definite evidence for osteomyelitis, though if there is significant clinical concern for osteomyelitis, MRI could be considered for further evaluation. 2.  Soft tissue defect and swelling along the plantar aspect of the forefoot.  Original Report Authenticated By: Tonia Ghent, M.D.   Dg Foot 2 Views Right  10/15/2011  *RADIOLOGY REPORT*  Clinical Data: Penetrating wounds to the ball of the right foot from blisters, with diffuse erythema and pain.  RIGHT FOOT - 2 VIEW  Comparison: None.  Findings: There is no evidence of fracture or dislocation.  No osseous erosions are seen to suggest osteomyelitis.  The joint spaces are preserved.  There is no evidence of talar subluxation; the subtalar joint is unremarkable in appearance.  Significant diffuse soft tissue air is noted tracking about the first toe and dorsal to the forefoot, concerning for necrotizing fasciitis.  Underlying soft tissue defects are noted along the plantar aspect of the forefoot.  IMPRESSION:  1.  No evidence of fracture or dislocation.  No definite evidence for osteomyelitis, though if there is significant clinical concern for osteomyelitis, MRI could be considered for further evaluation. 2.  Significant diffuse soft tissue air about the first toe and dorsum of the forefoot, concerning for necrotizing fasciitis.  Findings were discussed with Earley Favor at 01:03 a.m. on 10/14/2011.  Original Report Authenticated By: Tonia Ghent, M.D.    Antibiotics:  Anti-infectives     Start     Dose/Rate Route Frequency  Ordered Stop   10/15/11 1000  vancomycin (VANCOCIN) IVPB 1000 mg/200 mL premix        1,000 mg 200 mL/hr over 60 Minutes Intravenous Every 12 hours 10/15/11 0715     10/15/11 0800   piperacillin-tazobactam (ZOSYN) IVPB 3.375 g        3.375 g 12.5 mL/hr over 240 Minutes Intravenous 3 times per day 10/15/11 0715     10/14/11 2300   vancomycin (VANCOCIN) IVPB 1000 mg/200 mL premix        1,000 mg 200 mL/hr over 60 Minutes Intravenous  Once 10/14/11 2256 10/15/11 0104          Medications: Scheduled Meds:    . aspirin  325 mg Oral Daily  . diltiazem  120 mg Oral Daily  . docusate sodium  100 mg Oral BID  . insulin aspart  0-15 Units Subcutaneous TID WC  . insulin aspart  0-5 Units Subcutaneous QHS  . insulin aspart  10 Units Subcutaneous TID WC  . insulin glargine  40 Units Subcutaneous Q0700  . metoprolol tartrate  50 mg Oral BID  . pantoprazole  40 mg Oral Q1200  . piperacillin-tazobactam (ZOSYN)  IV  3.375 g Intravenous Q8H  . rosuvastatin  20 mg Oral q1800  . senna  1 tablet Oral BID  . vancomycin  1,000 mg Intravenous Q12H   Continuous Infusions:    . sodium chloride 75 mL/hr at 10/23/11 0530  . heparin 1,450 Units/hr (10/23/11 0954)   PRN Meds:.acetaminophen, acetaminophen, HYDROmorphone, LORazepam, ondansetron (ZOFRAN) IV, ondansetron, oxyCODONE, polyethylene glycol  Assessment/Plan: Bianca Vester is a 55 y.o. 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 Evalina Field saw over weekend  --in the interval I will simplify his IV antibiotics to zosyn alone which would cover all the bacteria we have isolated --If he went home with iv therapy, a meropenem might be convenient for dosing but pt  himself has been not very keen on iV abx, so if we went thatroute would go with ciprofloxacin and augmentin,   LOS: 9 days    Paulette Blanch Dam 10/23/2011, 2:24 PM

## 2011-10-23 NOTE — Progress Notes (Signed)
pts blood sugar is 80. Dr. Isidoro Donning ordered to hold the 10 units of meal coverage.

## 2011-10-23 NOTE — Progress Notes (Signed)
Physical Therapy Treatment Patient Details Name: [name] MRN: [id number] DOB: 03-08-1956 Today's Date: 10/23/2011  PT Assessment/Plan  PT - Assessment/Plan Comments on Treatment Session: Pt c/o of mild Right foot pain while walking but does not rate.  Pt able to increase ambulation distance and needed less assistance with mobility today.  Continue to believe pt would benefit from further rehab prior to d/c if there is not 24 hour assistance at home to assist with needs. PT Plan: Discharge plan remains appropriate PT Frequency: Min 3X/week Follow Up Recommendations: Home health PT;24 hour supervision/assistance;Other (comment) (If no 24 hour assist may benefit from SNF.) Equipment Recommended: Defer to next venue;Rolling walker with 5" wheels;3 in 1 bedside comode PT Goals  Acute Rehab PT Goals PT Goal Formulation: With patient Time For Goal Achievement: 7 days Pt will go Sit to Stand: with modified independence PT Goal: Sit to Stand - Progress: Progressing toward goal Pt will go Stand to Sit: with modified independence PT Goal: Stand to Sit - Progress: Progressing toward goal Pt will Transfer Bed to Chair/Chair to Bed: with modified independence PT Transfer Goal: Bed to Chair/Chair to Bed - Progress: Progressing toward goal Pt will Ambulate: >150 feet;with modified independence;with least restrictive assistive device;with gait velocity >(comment) ft/second PT Goal: Ambulate - Progress: Progressing toward goal Pt will Go Up / Down Stairs: 1-2 stairs;with modified independence;with least restrictive assistive device;with rail(s) PT Goal: Up/Down Stairs - Progress: Other (comment) (not assessed)  PT Treatment Precautions/Restrictions  Precautions Precautions: Fall Restrictions Weight Bearing Restrictions: Yes RLE Weight Bearing: Weight bearing as tolerated (on heel) Mobility (including Balance) Bed Mobility Bed Mobility: Yes Supine to Sit: 6: Modified independent  (Device/Increase time) Sitting - Scoot to Edge of Bed: 5: Supervision Sitting - Scoot to [place] of Bed Details (indicate cue type and reason): VCs to initiate scooting. Sit to Supine - Right: 6: Modified independent (Device/Increase time);HOB flat;With rail Transfers Transfers: Yes Sit to Stand: From bed;With armrests;From elevated surface;4: Min assist Sit to Stand Details (indicate cue type and reason): Min A to initiate transfer with VCs for proper hand placment. Stand to Sit: Other (comment);To bed;To elevated surface (minguard for safety) Stand to Sit Details: Minguard for safety to slowly descend to chair. Ambulation/Gait Ambulation/Gait: Yes Ambulation/Gait Assistance: Other (comment) (minguard for safety) Ambulation/Gait Assistance Details (indicate cue type and reason): VCs for RW placment and keeping body within RW especially with turns.  Pt unsteady and impulsive at times with RW and overall movement.  Pt needs VCs for safety. Ambulation Distance (Feet): 60 Feet Assistive device: Rolling walker Gait Pattern: Step-to pattern;Decreased stance time - right;Antalgic;Shuffle Gait velocity: Decreased, not measured due to limited ambulation distance in room. Stairs: No Wheelchair Mobility Wheelchair Mobility: No  Posture/Postural Control Posture/Postural Control: No significant limitations Balance Balance Assessed: No Exercise    End of Session PT - End of Session Equipment Utilized During Treatment: Gait belt;Other (comment);Right ankle foot orthosis Activity Tolerance: Patient tolerated treatment well Patient left: in bed;with call bell in reach Nurse Communication: Mobility status for transfers;Mobility status for ambulation;Weight bearing status General Behavior During Session: [facility] for tasks performed Cognition: [facility] for tasks performed  Reigna Ruperto 10/23/2011, 2:28 PM [phone]

## 2011-10-23 NOTE — Progress Notes (Signed)
Patient had fever and right foot bandage surgical site was soaked in blood.  Instructed the nurse to give Tylenol for the fever.  Changed the patient's bandages with fresh wet sterile saline 4 x 4's and Kerlix after gently cleaning the surrounding wound with saline gauze.  Continue Zosyn with antibiotics as per ID.

## 2011-10-24 LAB — GLUCOSE, CAPILLARY
Glucose-Capillary: 156 mg/dL — ABNORMAL HIGH (ref 70–99)
Glucose-Capillary: 137 mg/dL — ABNORMAL HIGH (ref 70–99)

## 2011-10-24 LAB — CBC
MCH: 25.8 pg — ABNORMAL LOW (ref 26.0–34.0)
MCV: 80.7 fL (ref 78.0–100.0)
Platelets: 39 10*3/uL — ABNORMAL LOW (ref 150–400)
RDW: 14.4 % (ref 11.5–15.5)

## 2011-10-24 LAB — ANAEROBIC CULTURE

## 2011-10-24 NOTE — Progress Notes (Signed)
Subjective: Increased foot pain Objective: Weight change:   Intake/Output Summary (Last 24 hours) at 10/24/11 1806 Last data filed at 10/24/11 1543  Gross per 24 hour  Intake 1130.11 ml  Output   2476 ml  Net -1345.89 ml   Blood pressure 113/68, pulse 68, temperature 98.5 F (36.9 C), temperature source Oral, resp. rate 19, height 5\' 7"  (1.702 m), weight 182 lb 1.6 oz (82.6 kg), SpO2 97.00%. Temp:  [98.2 F (36.8 C)-101.9 F (38.8 C)] 98.5 F (36.9 C) (12/11 1436) Pulse Rate:  [66-99] 68  (12/11 1436) Resp:  [19-20] 19  (12/11 1436) BP: (108-138)/(64-78) 113/68 mmHg (12/11 1436) SpO2:  [93 %-98 %] 97 % (12/11 1436) Weight:  [182 lb 1.6 oz (82.6 kg)] 182 lb 1.6 oz (82.6 kg) (12/11 0535)  Physical Exam: General: Alert and awake, oriented x3, not in any acute distress. HEENT: anicteric sclera, pupils reactive to light and accommodation, EOMI CVS regular rate, normal r,  no murmur rubs or gallops Chest: clear to auscultation bilaterally, no wheezing, rales or rhonchi Abdomen: soft nontender, nondistended, normal bowel sounds, Extremities: his left ankle with dressing  Lymph: no new lymphadenopathy Neuro: nonfocal  Lab Results:  Basename 10/24/11 1137 10/24/11 0555 10/23/11 0540  WBC -- 3.5* 5.7  HGB 8.6* 8.7* --  HCT 27.0* 27.2* --  PLT -- 39* 62*   BMET  Basename 10/23/11 0540 10/21/11 2039  NA 139 --  K 3.7 5.8*  CL 98 --  CO2 33* --  GLUCOSE 151* --  BUN 10 --  CREATININE 1.30 --  CALCIUM 8.1* --    Micro Results: Recent Results (from the past 240 hour(s))  WOUND CULTURE     Status: Normal   Collection Time   10/14/11 10:29 PM      Component Value Range Status Comment   Specimen Description WOUND RIGHT POSTERIOR FOOT   Final    Special Requests NONE   Final    Gram Stain     Final    Value: NO WBC SEEN     NO SQUAMOUS EPITHELIAL CELLS SEEN     RARE GRAM NEGATIVE RODS   Culture ABUNDANT PSEUDOMONAS AERUGINOSA   Final    Report Status 10/19/2011 FINAL    Final    Organism ID, Bacteria PSEUDOMONAS AERUGINOSA   Final   MRSA PCR SCREENING     Status: Normal   Collection Time   10/15/11  7:07 AM      Component Value Range Status Comment   MRSA by PCR NEGATIVE  NEGATIVE  Final   SURGICAL PCR SCREEN     Status: Normal   Collection Time   10/17/11  7:51 PM      Component Value Range Status Comment   MRSA, PCR NEGATIVE  NEGATIVE  Final    Staphylococcus aureus NEGATIVE  NEGATIVE  Final   CULTURE, ROUTINE-ABSCESS     Status: Normal   Collection Time   10/17/11  9:30 PM      Component Value Range Status Comment   Specimen Description ABSCESS FOOT RIGHT   Final    Special Requests NONE   Final    Gram Stain     Final    Value: ABUNDANT WBC PRESENT,BOTH PMN AND MONONUCLEAR     RARE SQUAMOUS EPITHELIAL CELLS PRESENT     FEW GRAM POSITIVE COCCI IN PAIRS     RARE GRAM NEGATIVE RODS   Culture     Final    Value: FEW KLEBSIELLA OXYTOCA  MODERATE ENTEROCOCCUS SPECIES   Report Status 10/21/2011 FINAL   Final    Organism ID, Bacteria KLEBSIELLA OXYTOCA   Final    Organism ID, Bacteria ENTEROCOCCUS SPECIES   Final   ANAEROBIC CULTURE     Status: Normal   Collection Time   10/17/11  9:30 PM      Component Value Range Status Comment   Specimen Description ABSCESS FOOT RIGHT   Final    Special Requests NONE   Final    Gram Stain     Final    Value: ABUNDANT WBC PRESENT,BOTH PMN AND MONONUCLEAR     RARE SQUAMOUS EPITHELIAL CELLS PRESENT     FEW GRAM POSITIVE COCCI IN PAIRS     RARE GRAM NEGATIVE RODS   Culture     Final    Value: FEW BACTEROIDES FRAGILIS     Note: BETA LACTAMASE POSITIVE   Report Status 10/24/2011 FINAL   Final     Studies/Results: Ct Angio Chest W/cm &/or Wo Cm  10/15/2011  *RADIOLOGY REPORT*  Clinical Data:  Chest pain with shortness of breath and elevated D- dimer levels.  Diabetes.  Question pulmonary embolism.  CT ANGIOGRAPHY CHEST WITH CONTRAST  Technique:  Multidetector CT imaging of the chest was performed using the  standard protocol during bolus administration of intravenous contrast.  Multiplanar CT image reconstructions including MIPs were obtained to evaluate the vascular anatomy.  Contrast: OMNIPAQUE IOHEXOL 300 MG/ML IV SOLN  Comparison:  None.  Findings:  The pulmonary arteries are well opacified with contrast. There is no evidence of acute pulmonary embolism.  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  The heart is mildly enlarged.  No enlarged mediastinal or hilar lymph nodes are demonstrated.  A pretracheal node demonstrates a fatty hilum.  There is no pleural or pericardial effusion.  The lungs are clear.  Images through the upper abdomen demonstrate contour irregularity of the liver suspicious for cirrhosis.  The spleen appears mildly enlarged.  There are multiple small calcified gallstones.  Review of the MIP images confirms the above findings.  IMPRESSION:  1.  No evidence of acute pulmonary embolism or other acute chest process. 2.  Cholelithiasis. 3.  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  Correlate clinically. 4.  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  Original Report Authenticated By: Gerrianne Scale, M.D.   MRI:  Comparison: MRI 10/15/2011.  Findings: Since the prior examination, the patient has undergone  amputation at the level of the proximal diaphysis of the first and  second metatarsals.  Soft tissues demonstrate extensive edema and enhancement. There  appears be a soft tissue wound over the dorsum of the foot. There  is a rim enhancing fluid collection originating from the medial  margin of the residual base of the first metatarsal extending  distally to the end of the medial aspect of the foot. The  collection also extends over the dorsum of the foot at the level of  the mid metatarsals to the level of the fourth metatarsal. The  collection cannot be discretely measured but is approximately 4.7  cm long by up to 2.8 cm cranial-caudal by 2.5 cm  transverse.  IMPRESSION:  1. Status post amputation at the level the bases of the first and  second metatarsals. No evidence of osteomyelitis is identified.  2. Persistent marked cellulitis with a rim enhancing fluid  collection compatible with abscess. As noted above, the collection  extends from approximately the medial aspect of the base of  the  first metatarsal distally to the end of the medial aspect of the  foot. At the level of the mid metatarsals, the collection extends  over the dorsum of the foot laterally to the level of the fourth  metatarsal.   10/15/2011  *RADIOLOGY REPORT*  Clinical Data: Infected diabetic foot ulcer.  MRI OF THE RIGHT FOREFOOT WITHOUT AND WITH CONTRAST  Technique:  Multiplanar, multisequence MR imaging was performed both before and after administration of intravenous contrast.  Contrast: 20mL MULTIHANCE GADOBENATE DIMEGLUMINE 529 MG/ML IV SOLN  Comparison: Right foot radiographs.  Findings: There is a large soft tissue abscess involving the medial aspect of the forefoot it is largely between the first and second toe is extending along the dorsum of the first metatarsal.  As noted on the plain films there is extensive gas formation in the soft tissues.  No obvious changes of septic arthritis but there are findings for osteomyelitis involving the first metatarsal and first proximal phalanx.  There is associated diffuse cellulitis and myofasciitis.  No involvement of the forefoot or visualized hind foot bony structures.  IMPRESSION:  1.  Large soft tissue abscess involving the medial forefoot with gas formation. 2.  Osteomyelitis involving the first metatarsal and first proximal phalanx. 3.  Diffuse cellulitis and myofasciitis.  Original Report Authenticated By: P. Loralie Champagne, M.D.   Dg Foot 2 Views Left  10/15/2011  *RADIOLOGY REPORT*  Clinical Data: Penetrating wounds to the ball of the left foot, with worsening erythema and blistering.  LEFT FOOT - 2 VIEW   Comparison: None.  Findings: There is no evidence of fracture or dislocation.  No osseous erosions are seen to suggest osteomyelitis.  The joint spaces are preserved.  There is no evidence of talar subluxation; the subtalar joint is unremarkable in appearance.  Soft tissue swelling and defect are noted along the plantar aspect of the forefoot.  IMPRESSION:  1.  No evidence of fracture or dislocation.  No definite evidence for osteomyelitis, though if there is significant clinical concern for osteomyelitis, MRI could be considered for further evaluation. 2.  Soft tissue defect and swelling along the plantar aspect of the forefoot.  Original Report Authenticated By: Tonia Ghent, M.D.   Dg Foot 2 Views Right  10/15/2011  *RADIOLOGY REPORT*  Clinical Data: Penetrating wounds to the ball of the right foot from blisters, with diffuse erythema and pain.  RIGHT FOOT - 2 VIEW  Comparison: None.  Findings: There is no evidence of fracture or dislocation.  No osseous erosions are seen to suggest osteomyelitis.  The joint spaces are preserved.  There is no evidence of talar subluxation; the subtalar joint is unremarkable in appearance.  Significant diffuse soft tissue air is noted tracking about the first toe and dorsal to the forefoot, concerning for necrotizing fasciitis.  Underlying soft tissue defects are noted along the plantar aspect of the forefoot.  IMPRESSION:  1.  No evidence of fracture or dislocation.  No definite evidence for osteomyelitis, though if there is significant clinical concern for osteomyelitis, MRI could be considered for further evaluation. 2.  Significant diffuse soft tissue air about the first toe and dorsum of the forefoot, concerning for necrotizing fasciitis.  Findings were discussed with Earley Favor at 01:03 a.m. on 10/14/2011.  Original Report Authenticated By: Tonia Ghent, M.D.    Antibiotics:  Anti-infectives     Start     Dose/Rate Route Frequency Ordered Stop   10/15/11 1000    vancomycin (VANCOCIN) IVPB 1000 mg/200  mL premix  Status:  Discontinued        1,000 mg 200 mL/hr over 60 Minutes Intravenous Every 12 hours 10/15/11 0715 10/23/11 1431   10/15/11 0800  piperacillin-tazobactam (ZOSYN) IVPB 3.375 g       3.375 g 12.5 mL/hr over 240 Minutes Intravenous 3 times per day 10/15/11 0715     10/14/11 2300   vancomycin (VANCOCIN) IVPB 1000 mg/200 mL premix        1,000 mg 200 mL/hr over 60 Minutes Intravenous  Once 10/14/11 2256 10/15/11 0104          Medications: Scheduled Meds:    . aspirin  325 mg Oral Daily  . diltiazem  120 mg Oral Daily  . docusate sodium  100 mg Oral BID  . insulin aspart  0-15 Units Subcutaneous TID WC  . insulin aspart  0-5 Units Subcutaneous QHS  . insulin glargine  30 Units Subcutaneous Q0700  . metoprolol tartrate  50 mg Oral BID  . pantoprazole  40 mg Oral Q1200  . piperacillin-tazobactam (ZOSYN)  IV  3.375 g Intravenous Q8H  . rosuvastatin  20 mg Oral q1800  . senna  1 tablet Oral BID  . DISCONTD: insulin aspart  10 Units Subcutaneous TID WC  . DISCONTD: insulin glargine  40 Units Subcutaneous Q0700   Continuous Infusions:    . DISCONTD: sodium chloride 75 mL/hr at 10/23/11 1904  . DISCONTD: heparin Stopped (10/24/11 0902)   PRN Meds:.acetaminophen, acetaminophen, HYDROmorphone, LORazepam, ondansetron (ZOFRAN) IV, ondansetron, oxyCODONE, polyethylene glycol  Assessment/Plan: [name] is a 55 y.o. 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 Dr August Saucer considering more aggressive proximal amputation.  1) Osteomyelitis with likely abscess: --I think he is going to need further more definitive surgery, Dr August Saucer to come and examine pt again today, Dr. Evalina Field saw over weekend  --in the interva continue zosyn alone which would cover all the bacteria we have isolated --If he went home  with iv therapy, a meropenem might be convenient for dosing but pt himself has been not very keen on iV abx, so if we went thatroute would go with ciprofloxacin and augmentin,   LOS: 10 days    Acey Lav 10/24/2011, 6:06 PM

## 2011-10-24 NOTE — Progress Notes (Signed)
Subjective: Since overnight, the right foot wound has been bleeding. At the time of my examination the dressing was intact with no active bleeding, but dressing was changed earlier. Heparin drip discontinued.   Objective:  Weight change:   Intake/Output Summary (Last 24 hours) at 10/24/11 1354 Last data filed at 10/24/11 1027  Gross per 24 hour  Intake 890.11 ml  Output   3075 ml  Net -2184.89 ml   Blood pressure 120/67, pulse 66, temperature 98.2 F (36.8 C), temperature source Oral, resp. rate 20, height 5\' 7"  (1.702 m), weight 82.6 kg (182 lb 1.6 oz), SpO2 98.00%.  On Exam alert, comfortable.   Gen: Alert and oriented x3 not in any distress Heart:S1S2 Irreg, irreg.  Lungs:CTAB, no wheezing or rhonchi. ABD: soft non tender , bowel sounds present. Extremities. Right foot dressing intact  Neuro: Alert and oriented x3 no focal neurological deficits noted   Lab Results: Results for orders placed during the hospital encounter of 10/14/11 (from the past 24 hour(s))  GLUCOSE, CAPILLARY     Status: Abnormal   Collection Time   10/23/11  4:17 PM      Component Value Range   Glucose-Capillary 42 (*) 70 - 99 (mg/dL)   Comment 1 Notify RN    GLUCOSE, CAPILLARY     Status: Abnormal   Collection Time   10/23/11  4:48 PM      Component Value Range   Glucose-Capillary 61 (*) 70 - 99 (mg/dL)   Comment 1 Repeat Test    GLUCOSE, CAPILLARY     Status: Abnormal   Collection Time   10/23/11  4:52 PM      Component Value Range   Glucose-Capillary 64 (*) 70 - 99 (mg/dL)  GLUCOSE, CAPILLARY     Status: Normal   Collection Time   10/23/11  5:34 PM      Component Value Range   Glucose-Capillary 80  70 - 99 (mg/dL)  GLUCOSE, CAPILLARY     Status: Abnormal   Collection Time   10/23/11  9:15 PM      Component Value Range   Glucose-Capillary 152 (*) 70 - 99 (mg/dL)   Comment 1 Notify RN    HEPARIN LEVEL (UNFRACTIONATED)     Status: Normal   Collection Time   10/24/11  5:55 AM   Component Value Range   Heparin Unfractionated 0.53  0.30 - 0.70 (IU/mL)  CBC     Status: Abnormal   Collection Time   10/24/11  5:55 AM      Component Value Range   WBC 3.5 (*) 4.0 - 10.5 (K/uL)   RBC 3.37 (*) 4.22 - 5.81 (MIL/uL)   Hemoglobin 8.7 (*) 13.0 - 17.0 (g/dL)   HCT 78.2 (*) 95.6 - 52.0 (%)   MCV 80.7  78.0 - 100.0 (fL)   MCH 25.8 (*) 26.0 - 34.0 (pg)   MCHC 32.0  30.0 - 36.0 (g/dL)   RDW 21.3  08.6 - 57.8 (%)   Platelets 39 (*) 150 - 400 (K/uL)  GLUCOSE, CAPILLARY     Status: Abnormal   Collection Time   10/24/11  6:52 AM      Component Value Range   Glucose-Capillary 156 (*) 70 - 99 (mg/dL)  GLUCOSE, CAPILLARY     Status: Abnormal   Collection Time   10/24/11 11:26 AM      Component Value Range   Glucose-Capillary 182 (*) 70 - 99 (mg/dL)  HEMOGLOBIN AND HEMATOCRIT, BLOOD     Status:  Abnormal   Collection Time   10/24/11 11:37 AM      Component Value Range   Hemoglobin 8.6 (*) 13.0 - 17.0 (g/dL)   HCT 16.1 (*) 09.6 - 52.0 (%)     Micro Results: Recent Results (from the past 240 hour(s))  WOUND CULTURE     Status: Normal   Collection Time   10/14/11 10:29 PM      Component Value Range Status Comment   Specimen Description WOUND RIGHT POSTERIOR FOOT   Final    Special Requests NONE   Final    Gram Stain     Final    Value: NO WBC SEEN     NO SQUAMOUS EPITHELIAL CELLS SEEN     RARE GRAM NEGATIVE RODS   Culture ABUNDANT PSEUDOMONAS AERUGINOSA   Final    Report Status 10/19/2011 FINAL   Final    Organism ID, Bacteria PSEUDOMONAS AERUGINOSA   Final   MRSA PCR SCREENING     Status: Normal   Collection Time   10/15/11  7:07 AM      Component Value Range Status Comment   MRSA by PCR NEGATIVE  NEGATIVE  Final   SURGICAL PCR SCREEN     Status: Normal   Collection Time   10/17/11  7:51 PM      Component Value Range Status Comment   MRSA, PCR NEGATIVE  NEGATIVE  Final    Staphylococcus aureus NEGATIVE  NEGATIVE  Final   CULTURE, ROUTINE-ABSCESS     Status:  Normal   Collection Time   10/17/11  9:30 PM      Component Value Range Status Comment   Specimen Description ABSCESS FOOT RIGHT   Final    Special Requests NONE   Final    Gram Stain     Final    Value: ABUNDANT WBC PRESENT,BOTH PMN AND MONONUCLEAR     RARE SQUAMOUS EPITHELIAL CELLS PRESENT     FEW GRAM POSITIVE COCCI IN PAIRS     RARE GRAM NEGATIVE RODS   Culture     Final    Value: FEW KLEBSIELLA OXYTOCA     MODERATE ENTEROCOCCUS SPECIES   Report Status 10/21/2011 FINAL   Final    Organism ID, Bacteria KLEBSIELLA OXYTOCA   Final    Organism ID, Bacteria ENTEROCOCCUS SPECIES   Final   ANAEROBIC CULTURE     Status: Normal   Collection Time   10/17/11  9:30 PM      Component Value Range Status Comment   Specimen Description ABSCESS FOOT RIGHT   Final    Special Requests NONE   Final    Gram Stain     Final    Value: ABUNDANT WBC PRESENT,BOTH PMN AND MONONUCLEAR     RARE SQUAMOUS EPITHELIAL CELLS PRESENT     FEW GRAM POSITIVE COCCI IN PAIRS     RARE GRAM NEGATIVE RODS   Culture     Final    Value: FEW BACTEROIDES FRAGILIS     Note: BETA LACTAMASE POSITIVE   Report Status 10/24/2011 FINAL   Final     Studies/Results: Ct Angio Chest W/cm &/or Wo Cm  10/15/2011  *RADIOLOGY REPORT*  Clinical Data:  Chest pain with shortness of breath and elevated D- dimer levels.  Diabetes.  Question pulmonary embolism.  CT ANGIOGRAPHY CHEST WITH CONTRAST  Technique:  Multidetector CT imaging of the chest was performed using the standard protocol during bolus administration of intravenous contrast.  Multiplanar CT image reconstructions  including MIPs were obtained to evaluate the vascular anatomy.  Contrast: OMNIPAQUE IOHEXOL 300 MG/ML IV SOLN  Comparison:  None.  Findings:  The pulmonary arteries are well opacified with contrast. There is no evidence of acute pulmonary embolism.  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  The heart is mildly enlarged.  No enlarged mediastinal or hilar lymph nodes  are demonstrated.  A pretracheal node demonstrates a fatty hilum.  There is no pleural or pericardial effusion.  The lungs are clear.  Images through the upper abdomen demonstrate contour irregularity of the liver suspicious for cirrhosis.  The spleen appears mildly enlarged.  There are multiple small calcified gallstones.  Review of the MIP images confirms the above findings.  IMPRESSION:  1.  No evidence of acute pulmonary embolism or other acute chest process. 2.  Cholelithiasis. 3.  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  Correlate clinically. 4.  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  Original Report Authenticated By: Gerrianne Scale, M.D.   Mr Foot Right W Wo Contrast  10/15/2011  *RADIOLOGY REPORT*  Clinical Data: Infected diabetic foot ulcer.  MRI OF THE RIGHT FOREFOOT WITHOUT AND WITH CONTRAST  Technique:  Multiplanar, multisequence MR imaging was performed both before and after administration of intravenous contrast.  Contrast: 20mL MULTIHANCE GADOBENATE DIMEGLUMINE 529 MG/ML IV SOLN  Comparison: Right foot radiographs.  Findings: There is a large soft tissue abscess involving the medial aspect of the forefoot it is largely between the first and second toe is extending along the dorsum of the first metatarsal.  As noted on the plain films there is extensive gas formation in the soft tissues.  No obvious changes of septic arthritis but there are findings for osteomyelitis involving the first metatarsal and first proximal phalanx.  There is associated diffuse cellulitis and myofasciitis.  No involvement of the forefoot or visualized hind foot bony structures.  IMPRESSION:  1.  Large soft tissue abscess involving the medial forefoot with gas formation. 2.  Osteomyelitis involving the first metatarsal and first proximal phalanx. 3.  Diffuse cellulitis and myofasciitis.  Original Report Authenticated By: P. Loralie Champagne, M.D.   Dg Foot 2 Views Left  10/15/2011  *RADIOLOGY  REPORT*  Clinical Data: Penetrating wounds to the ball of the left foot, with worsening erythema and blistering.  LEFT FOOT - 2 VIEW  Comparison: None.  Findings: There is no evidence of fracture or dislocation.  No osseous erosions are seen to suggest osteomyelitis.  The joint spaces are preserved.  There is no evidence of talar subluxation; the subtalar joint is unremarkable in appearance.  Soft tissue swelling and defect are noted along the plantar aspect of the forefoot.  IMPRESSION:  1.  No evidence of fracture or dislocation.  No definite evidence for osteomyelitis, though if there is significant clinical concern for osteomyelitis, MRI could be considered for further evaluation. 2.  Soft tissue defect and swelling along the plantar aspect of the forefoot.  Original Report Authenticated By: Tonia Ghent, M.D.   Dg Foot 2 Views Right  10/15/2011  *RADIOLOGY REPORT*  Clinical Data: Penetrating wounds to the ball of the right foot from blisters, with diffuse erythema and pain.  RIGHT FOOT - 2 VIEW  Comparison: None.  Findings: There is no evidence of fracture or dislocation.  No osseous erosions are seen to suggest osteomyelitis.  The joint spaces are preserved.  There is no evidence of talar subluxation; the subtalar joint is unremarkable in appearance.  Significant diffuse soft tissue  air is noted tracking about the first toe and dorsal to the forefoot, concerning for necrotizing fasciitis.  Underlying soft tissue defects are noted along the plantar aspect of the forefoot.  IMPRESSION:  1.  No evidence of fracture or dislocation.  No definite evidence for osteomyelitis, though if there is significant clinical concern for osteomyelitis, MRI could be considered for further evaluation. 2.  Significant diffuse soft tissue air about the first toe and dorsum of the forefoot, concerning for necrotizing fasciitis.  Findings were discussed with Earley Favor at 01:03 a.m. on 10/14/2011.  Original Report Authenticated  By: Tonia Ghent, M.D.   Medications: Scheduled Meds:    . aspirin  325 mg Oral Daily  . diltiazem  120 mg Oral Daily  . docusate sodium  100 mg Oral BID  . insulin aspart  0-15 Units Subcutaneous TID WC  . insulin aspart  0-5 Units Subcutaneous QHS  . insulin glargine  30 Units Subcutaneous Q0700  . LORazepam  1 mg Oral Once  . metoprolol tartrate  50 mg Oral BID  . pantoprazole  40 mg Oral Q1200  . piperacillin-tazobactam (ZOSYN)  IV  3.375 g Intravenous Q8H  . rosuvastatin  20 mg Oral q1800  . senna  1 tablet Oral BID  . DISCONTD: insulin aspart  10 Units Subcutaneous TID WC  . DISCONTD: insulin glargine  40 Units Subcutaneous Q0700  . DISCONTD: vancomycin  1,000 mg Intravenous Q12H   Continuous Infusions:    . heparin Stopped (10/24/11 0902)  . DISCONTD: sodium chloride 75 mL/hr at 10/23/11 1904   PRN Meds:.acetaminophen, acetaminophen, HYDROmorphone, LORazepam, ondansetron (ZOFRAN) IV, ondansetron, oxyCODONE, polyethylene glycol  Wound culture from 10/14/2011  Specimen Description  WOUND RIGHT POSTERIOR FOOT   Special Requests  NONE   Gram Stain  NO WBC SEEN NO SQUAMOUS EPITHELIAL CELLS SEEN RARE GRAM NEGATIVE RODS   Culture  ABUNDANT PSEUDOMONAS AERUGINOSA   Report Status  PENDING   Organism ID, Bacteria  PSEUDOMONAS AERUGINOSA   Resulting Agency  [facility]    Culture & Susceptibility     PSEUDOMONAS AERUGINOSA          Antibiotic  Sensitivity  Microscan  Status      CEFEPIME  Sensitive  2  Final      Method:  MIC      CEFTAZIDIME  Sensitive  <=1  Final      Method:  MIC      CIPROFLOXACIN  Sensitive  <=0.25  Final      Method:  MIC      GENTAMICIN  Sensitive  2  Final      Method:  MIC      IMIPENEM  Sensitive  <=1  Final      Method:  MIC      TOBRAMYCIN  Sensitive  <=1  Final      Method:  MIC       Comments  PSEUDOMONAS AERUGINOSA (MIC)        ABUNDANT PSEUDOMONAS AERUGINOSA    Interval history:  Patient is a 55 year old male with a history of  CAD, diabetes hypertension, presented with bilateral foot ulcers and right soft tissue infection due to diabetes also on 10/15/2011. Orthopedics was consulted and patient was seen by Dr. Dorene Grebe. Patient underwent a right foot excisional debridement and ray amputation #1 and #2 on 10/17/2011. Per cultures showed Pseudomonas aeruginosa, abscess cultures on 10/17/2011 also showed Klebsiella and enterococcus During admission patient also went into atrial flutter, was found  to have elevated troponins, was treated for NSTEMI/ demand ischemia with IV heparin and IV Cardizem. Rate has been well controlled now on on by mouth Cardizem and metoprolol. He remained on IV heparin until today when it was discontinued due to bleeding from the right foot wound. Per cardiology recommendations (Dr. 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 - on IV vanco and zosyn. Wound cultures growing pseudomonas,  abscess culture on 10/17/2011 shows Klebsiella oxytoca and enterococcus -Discussed with Dr. Dorene Grebe to inspect the wound today and recommendation regarding if he needs any further surgery. - Heparin drip discontinued. Stat H&H shows hemoglobin at 8.6, repeat in a.m. and transfuse if necessary  CAD (coronary artery disease), autologous vein bypass graft / s/p 2 stent placement: went into atrial flutter on admission was found to have elevated troponins, was  treated for NSTEMI/ Demand ischemia with IV heparin and IV Cardizem. His rate was better controlled and IV cardizem was changed to po cardizem. IV  Heparin was held for surgery and restarted after surgery.  - Continue with aspirin/ metoprolol/ crestor.    Atrial flutter with NSTEMI/demand ischemia: Followed by cardiology - Continue metoprolol  and Cardizem by mouth.  - DC heparin drip. Will start the xarelto after the surgery - DCCV outpatient per Dr. Blanchie Dessert recommendations  Hypertension controlled  Diabetes Mellitus:  No further hypoglycemia episodes -  Continue insulin at current regimen, Continue carb modified diet  DVT prophylaxis: On heparin drip   [name] 10/24/2011, 1:54 PM

## 2011-10-24 NOTE — Progress Notes (Signed)
Pts dressing on his foot was saturated and IV site was bleeding. Dr. Isidoro Donning notified and heparin was stopped and IV was DC.

## 2011-10-24 NOTE — Progress Notes (Signed)
Dressing changed -  No active bleeding Incision marginal May need transmet vs. BKA NWB RLE

## 2011-10-25 ENCOUNTER — Other Ambulatory Visit: Payer: Self-pay

## 2011-10-25 DIAGNOSIS — I4891 Unspecified atrial fibrillation: Secondary | ICD-10-CM

## 2011-10-25 DIAGNOSIS — D696 Thrombocytopenia, unspecified: Secondary | ICD-10-CM

## 2011-10-25 DIAGNOSIS — D649 Anemia, unspecified: Secondary | ICD-10-CM

## 2011-10-25 DIAGNOSIS — L03119 Cellulitis of unspecified part of limb: Secondary | ICD-10-CM

## 2011-10-25 LAB — BASIC METABOLIC PANEL
BUN: 17 mg/dL (ref 6–23)
CO2: 29 mEq/L (ref 19–32)
Calcium: 7.5 mg/dL — ABNORMAL LOW (ref 8.4–10.5)
Creatinine, Ser: 1.2 mg/dL (ref 0.50–1.35)
Glucose, Bld: 195 mg/dL — ABNORMAL HIGH (ref 70–99)

## 2011-10-25 LAB — DIC (DISSEMINATED INTRAVASCULAR COAGULATION)PANEL
D-Dimer, Quant: 3.6 ug/mL-FEU — ABNORMAL HIGH (ref 0.00–0.48)
Fibrinogen: 541 mg/dL — ABNORMAL HIGH (ref 204–475)
Platelets: <5 10*3/uL — CL (ref 150–400)
Smear Review: NONE SEEN
aPTT: 40 seconds — ABNORMAL HIGH (ref 24–37)

## 2011-10-25 LAB — GLUCOSE, CAPILLARY
Glucose-Capillary: 160 mg/dL — ABNORMAL HIGH (ref 70–99)
Glucose-Capillary: 168 mg/dL — ABNORMAL HIGH (ref 70–99)

## 2011-10-25 LAB — CBC
HCT: 26.6 % — ABNORMAL LOW (ref 39.0–52.0)
Hemoglobin: 8.4 g/dL — ABNORMAL LOW (ref 13.0–17.0)
MCHC: 31.6 g/dL (ref 30.0–36.0)

## 2011-10-25 MED ORDER — SODIUM CHLORIDE 0.9 % IV BOLUS (SEPSIS)
500.0000 mL | Freq: Once | INTRAVENOUS | Status: AC
  Administered 2011-10-25: 500 mL via INTRAVENOUS

## 2011-10-25 MED ORDER — METRONIDAZOLE 500 MG PO TABS
500.0000 mg | ORAL_TABLET | Freq: Three times a day (TID) | ORAL | Status: DC
  Administered 2011-10-25 – 2011-10-26 (×4): 500 mg via ORAL
  Filled 2011-10-25 (×6): qty 1

## 2011-10-25 MED ORDER — SODIUM CHLORIDE 0.9 % IV SOLN
INTRAVENOUS | Status: DC
  Administered 2011-10-25: 150 mL/h via INTRAVENOUS
  Administered 2011-10-26: 04:00:00 1000 mL via INTRAVENOUS
  Administered 2011-10-26 (×2): via INTRAVENOUS

## 2011-10-25 MED ORDER — ENSURE IMMUNE HEALTH PO LIQD
237.0000 mL | Freq: Two times a day (BID) | ORAL | Status: DC
  Administered 2011-10-25 – 2011-10-29 (×8): 237 mL via ORAL
  Filled 2011-10-25 (×16): qty 237

## 2011-10-25 MED ORDER — VANCOMYCIN HCL IN DEXTROSE 1-5 GM/200ML-% IV SOLN
1000.0000 mg | Freq: Two times a day (BID) | INTRAVENOUS | Status: DC
  Administered 2011-10-25 – 2011-10-26 (×3): 1000 mg via INTRAVENOUS
  Filled 2011-10-25 (×4): qty 200

## 2011-10-25 MED ORDER — CIPROFLOXACIN IN D5W 400 MG/200ML IV SOLN
400.0000 mg | Freq: Three times a day (TID) | INTRAVENOUS | Status: DC
  Administered 2011-10-25 – 2011-11-01 (×21): 400 mg via INTRAVENOUS
  Filled 2011-10-25 (×24): qty 200

## 2011-10-25 MED ORDER — METOPROLOL TARTRATE 50 MG PO TABS
50.0000 mg | ORAL_TABLET | Freq: Two times a day (BID) | ORAL | Status: DC
  Administered 2011-10-25 – 2011-10-31 (×10): 50 mg via ORAL
  Filled 2011-10-25 (×15): qty 1

## 2011-10-25 NOTE — Consult Note (Signed)
Reason for consult: Thrombocytopenia Consulting MD: Dr. Clelia Croft Date of Consultation: 10/25/2011  HPI: 55 y/o wm from [place], [place], admitted to [facility] on 10/14/2011 with 1st toe and dorsum of R foot infection concerning for necrotizing fascitis. Platelets on admission were 123K. HH was nl at 14.4 and 41, and WBC was 10.4. Pt was placed on IV Vanco with Zosyn added thereafter, and IVF. In addition, Pt was tachycardic/AFlutter with EKG susp for NSTEMI (asymptomatic). Pt was placed on heparin per pharmacy. 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 August Saucer 12/4). Plts dropped to 106 on 12/3 and on 12/4 were 113k. Pts plt count continue to show steady decrease. On 12/5 were 110k. By then, he was on Heparin in preparation for possible DCCV for Afib, although it was eventually decided to treat clinically. Platelets recuperated slightly  to 114 on 12/6 and to 116 on 12/7. No acute bleed was noted during all this time. On 12/7, abscess culture (12/4) shows few Klebsiella oxytoca (resistant only to ampicillin) and moderate enterococcus species. As per ID note, Vanco/Zosyn were to continue. Platelets on 12/8 rose to 123 and on 12/9 to 124k. However, on 12/10, values dropped significantly to 62k. Heparin was d/c'd on 12/11 due to drop in platelets to 39 count as well as a decrease in Hb to 8.6 due to post op bleed and oozing from wound,along with other factors such as polypharmacy and IVF,   with plans to start pt on Xarelto as OP after DCCV Currently patient is on ASA qd. Today his plt count is 12k with no bleeding noted. Of note, Cipro has been added today to regimen. HIT panel pending. HIV negative. Hepatitis panel negative.  Creatinine 1.3  Sed rate 128.Of note, 12/2 CT angio of chest, neg for PE but incidental contour irregularity of the liver suspicious for cirrhosis and mildly enlarged spleen are seen.We were asked to see patient in consultation regarding  abnormal platelet count.  PMH: Past Medical History  Diagnosis Date  . CAD (coronary artery disease), autologous vein bypass graft       . HTN (hypertension)   . 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 Diabetes mellitus   . Anxiety/ Situational depression   . Headache   . Atrial flutter with rapid ventricular response 10/15/2011  . NSTEMI (non-ST elevated myocardial infarction) 10/17/2011    Surgeries: Past Surgical History  Procedure Date  . Coronary artery bypass graft 1980's [facility]   S/p card cath  1990's [facility]  . Amputation 10/17/2011    Procedure: AMPUTATION RAY;  Surgeon: Cammy Copa;  Location: [facility] OR;  Service: Orthopedics;  Laterality: Right;  First and Second Ray Amputation    Allergies: No Known Allergies  Medications:   Scheduled:   . aspirin  325 mg Oral Daily  . ciprofloxacin  400 mg Intravenous Q8H  . diltiazem  120 mg Oral Daily  . docusate sodium  100 mg Oral BID  . insulin aspart  0-15 Units Subcutaneous TID WC  . insulin aspart  0-5 Units Subcutaneous QHS  . insulin glargine  30 Units Subcutaneous Q0700  . metoprolol tartrate  50 mg Oral BID  . metroNIDAZOLE  500 mg Oral Q8H  . pantoprazole  40 mg Oral Q1200  . rosuvastatin  20 mg Oral q1800  . senna  1 tablet Oral BID  . vancomycin  1,000 mg Intravenous Q12H  . DISCONTD: piperacillin-tazobactam (ZOSYN)  IV  3.375 g Intravenous Q8H   ZOX:[id number], acetaminophen, HYDROmorphone, LORazepam, ondansetron (ZOFRAN) IV, ondansetron, oxyCODONE, polyethylene glycol  JYN:WGNFAO of Systems  Constitutional: Positive for weight loss from 300 lbs to 180 over the last year according to pt attributed to situational depression. Low grade fevers fever, No chills and malaise/fatigue.  Occasional night sweats.  Eyes: Negative for blurred vision and double vision.  Respiratory: Negative for cough, hemoptysis. Mild shortness of breath.    Cardiovascular: Negative for chest pain. GI: No nausea, vomiting, diarrhea, constipation. No change in bowel caliber. No  Melena or  Hematochezia.  GU: No blood in urine. No loss of urinary control. Skin: Negative for itching. No rash. No petechia. Denies easy bruising . Pt R foot healing p amputation Neurological: No headaches. No motor or sensory deficits.  Family History: Family History  Problem Relation Age of Onset  . Alzheimer's disease Father   . Melanoma Father   . Benign prostatic hyperplasia Father   . Coronary artery disease Father     s/p CABG  . Heart attack Brother   . Coronary artery disease Brother     s/p CABG  . Lymphoma Sister     non Hodgkin's   . Aortic aneurysm (thoracic) Mother   . Anemia Mother     Social History:  reports that he quit smoking about 3 months ago. His smoking use included Cigarettes. He has a 17.5 pack-year smoking history. He does not have any smokeless tobacco history on file. He reports that he does not drink alcohol or use illicit drugs.Denies risk for hepatitis or HIV Pt is single, no children. Cares for father who has Alzheimer's disease over the last year. Former Office manager.  Physical Exam  55 y.o. male in no acute distress  A. and O. x3 HEENT: Normocephalic, atraumatic, PERRLA. Oral cavity without thrush or lesions. Pale. Neck: supple. no thyromegaly, no cervical or supraclavicular adenopathy  Lungs: clear bilaterally . No wheezing, rhonchi or rales. No axillary masses. Breasts: not examined. Cardiac:irregular rate and rhythm normal S1-S2, no murmur , rubs or gallops Abdomen: soft nontender , bowel sounds x4  no hepatosplenomegaly GU/rectal: deferred. Extremities: no clubbing cyanosis . No edema.No bruising or petechial rash. R foot bandaged with some erythema above dressing. Neurologic: A. and O. x3, no focal deficits       Labs  CBC  Lab 10/25/11 0602 10/24/11 1137 10/24/11 0555 10/23/11 0540 10/22/11  0610 10/21/11 0600  WBC 5.1 -- 3.5* 5.7 4.8 4.5  HGB 8.4* 8.6* 8.7* 10.7* 10.0* --  HCT 26.6* 27.0* 27.2* 33.7* 31.7* --  PLT 12* -- 39* 62* 124* 123*  MCV 82.6 -- 80.7 82.4 82.8 81.6  MCH 26.1 -- 25.8* 26.2 26.1 26.5  MCHC 31.6 -- 32.0 31.8 31.5 32.5  RDW 14.5 -- 14.4 14.4 14.3 14.0  LYMPHSABS -- -- -- -- -- --  MONOABS -- -- -- -- -- --  EOSABS -- -- -- -- -- --  BASOSABS -- -- -- -- -- --  BANDABS -- -- -- -- -- --    CMP    Lab 10/23/11 0540 10/21/11 2039 10/21/11 0600 10/20/11 0615 10/19/11 0032  NA 139 -- 134* 136 130*  K 3.7 5.8* 2.9* 3.1* 3.4*  CL 98 -- 95* 98 95*  CO2 33* -- 32 32 27  GLUCOSE 151* -- 198* 231* 275*  BUN 10 -- 8 7 10   CREATININE 1.30 -- 1.11  1.10 1.03  CALCIUM 8.1* -- 7.7* 7.6* 7.6*  MG -- -- -- -- --  AST 30 -- -- -- --  ALT 15 -- -- -- --  ALKPHOS 64 -- -- -- --  BILITOT 0.3 -- -- -- --        Component Value Date/Time   BILITOT 0.3 10/23/2011 0540         Component Value Date/Time   ESRSEDRATE 128* 10/20/2011 0615        Imaging Studies: No results found.      A/P: 55 y.o. male asked to see for evaluation of thrombocytopenia in the setting of infection, NSTEMI, Afib, surgery and Heparinization and multiple antibiotics. Check peripheral blood smear and DIC panel. Await HIT results.  Dr. Clelia Croft   is to see the patient following this consult with recommendations regarding diagnosis, treatment options and further workup studies.  Thank you for the referral.  [facility] E 10/25/2011 10:30 AM

## 2011-10-25 NOTE — Progress Notes (Signed)
Subjective: No further bleeding today. He says he is not feeling well.   Objective:  Weight change: -5.572 kg (-12 lb 4.6 oz)  Intake/Output Summary (Last 24 hours) at 10/25/11 1759 Last data filed at 10/25/11 1607  Gross per 24 hour  Intake   1630 ml  Output    940 ml  Net    690 ml   Blood pressure 119/70, pulse 80, temperature 102.6 F (39.2 C), temperature source Oral, resp. rate 32, height 5\' 7"  (1.702 m), weight 81.7 kg (180 lb 1.9 oz), SpO2 93.00%.  On Exam lethargic, uncomfortable.    Heart:S1S2 RRR, no murmurs Lungs:CTAB, no wheezing or rhonchi. ABD: soft non tender , bowel sounds present. Extremities. Right foot dressing intact  Lab Results: Results for orders placed during the hospital encounter of 10/14/11 (from the past 24 hour(s))  GLUCOSE, CAPILLARY     Status: Abnormal   Collection Time   10/24/11  9:20 PM      Component Value Range   Glucose-Capillary 153 (*) 70 - 99 (mg/dL)  GLUCOSE, CAPILLARY     Status: Abnormal   Collection Time   10/25/11  5:56 AM      Component Value Range   Glucose-Capillary 160 (*) 70 - 99 (mg/dL)  CBC     Status: Abnormal   Collection Time   10/25/11  6:02 AM      Component Value Range   WBC 5.1  4.0 - 10.5 (K/uL)   RBC 3.22 (*) 4.22 - 5.81 (MIL/uL)   Hemoglobin 8.4 (*) 13.0 - 17.0 (g/dL)   HCT 40.9 (*) 81.1 - 52.0 (%)   MCV 82.6  78.0 - 100.0 (fL)   MCH 26.1  26.0 - 34.0 (pg)   MCHC 31.6  30.0 - 36.0 (g/dL)   RDW 91.4  78.2 - 95.6 (%)   Platelets 12 (*) 150 - 400 (K/uL)  GLUCOSE, CAPILLARY     Status: Abnormal   Collection Time   10/25/11 10:51 AM      Component Value Range   Glucose-Capillary 168 (*) 70 - 99 (mg/dL)   Comment 1 Notify RN    DIC PANEL     Status: Abnormal   Collection Time   10/25/11  2:00 PM      Component Value Range   Prothrombin Time 16.2 (*) 11.6 - 15.2 (seconds)   INR 1.27  0.00 - 1.49    aPTT 40 (*) 24 - 37 (seconds)   Fibrinogen 541 (*) 204 - 475 (mg/dL)   D-Dimer, Quant 2.13 (*) 0.00 -  0.48 (ug/mL-FEU)   Platelets <5 (*) 150 - 400 (K/uL)   Smear Review NO SCHISTOCYTES SEEN    SAVE SMEAR     Status: Normal   Collection Time   10/25/11  2:00 PM      Component Value Range   Smear Review SMEAR STAINED AND AVAILABLE FOR REVIEW    GLUCOSE, CAPILLARY     Status: Abnormal   Collection Time   10/25/11  4:46 PM      Component Value Range   Glucose-Capillary 115 (*) 70 - 99 (mg/dL)   Comment 1 Documented in Chart       Micro Results: Recent Results (from the past 240 hour(s))  SURGICAL PCR SCREEN     Status: Normal   Collection Time   10/17/11  7:51 PM      Component Value Range Status Comment   MRSA, PCR NEGATIVE  NEGATIVE  Final    Staphylococcus aureus  NEGATIVE  NEGATIVE  Final   CULTURE, ROUTINE-ABSCESS     Status: Normal   Collection Time   10/17/11  9:30 PM      Component Value Range Status Comment   Specimen Description ABSCESS FOOT RIGHT   Final    Special Requests NONE   Final    Gram Stain     Final    Value: ABUNDANT WBC PRESENT,BOTH PMN AND MONONUCLEAR     RARE SQUAMOUS EPITHELIAL CELLS PRESENT     FEW GRAM POSITIVE COCCI IN PAIRS     RARE GRAM NEGATIVE RODS   Culture     Final    Value: FEW KLEBSIELLA OXYTOCA     MODERATE ENTEROCOCCUS SPECIES   Report Status 10/21/2011 FINAL   Final    Organism ID, Bacteria KLEBSIELLA OXYTOCA   Final    Organism ID, Bacteria ENTEROCOCCUS SPECIES   Final   ANAEROBIC CULTURE     Status: Normal   Collection Time   10/17/11  9:30 PM      Component Value Range Status Comment   Specimen Description ABSCESS FOOT RIGHT   Final    Special Requests NONE   Final    Gram Stain     Final    Value: ABUNDANT WBC PRESENT,BOTH PMN AND MONONUCLEAR     RARE SQUAMOUS EPITHELIAL CELLS PRESENT     FEW GRAM POSITIVE COCCI IN PAIRS     RARE GRAM NEGATIVE RODS   Culture     Final    Value: FEW BACTEROIDES FRAGILIS     Note: BETA LACTAMASE POSITIVE   Report Status 10/24/2011 FINAL   Final     Studies/Results: Ct Angio Chest W/cm  &/or Wo Cm  10/15/2011  *RADIOLOGY REPORT*  Clinical Data:  Chest pain with shortness of breath and elevated D- dimer levels.  Diabetes.  Question pulmonary embolism.  CT ANGIOGRAPHY CHEST WITH CONTRAST  Technique:  Multidetector CT imaging of the chest was performed using the standard protocol during bolus administration of intravenous contrast.  Multiplanar CT image reconstructions including MIPs were obtained to evaluate the vascular anatomy.  Contrast: OMNIPAQUE IOHEXOL 300 MG/ML IV SOLN  Comparison:  None.  Findings:  The pulmonary arteries are well opacified with contrast. There is no evidence of acute pulmonary embolism.  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  The heart is mildly enlarged.  No enlarged mediastinal or hilar lymph nodes are demonstrated.  A pretracheal node demonstrates a fatty hilum.  There is no pleural or pericardial effusion.  The lungs are clear.  Images through the upper abdomen demonstrate contour irregularity of the liver suspicious for cirrhosis.  The spleen appears mildly enlarged.  There are multiple small calcified gallstones.  Review of the MIP images confirms the above findings.  IMPRESSION:  1.  No evidence of acute pulmonary embolism or other acute chest process. 2.  Cholelithiasis. 3.  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  Correlate clinically. 4.  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  Original Report Authenticated By: Gerrianne Scale, M.D.   Mr Foot Right W Wo Contrast  10/15/2011  *RADIOLOGY REPORT*  Clinical Data: Infected diabetic foot ulcer.  MRI OF THE RIGHT FOREFOOT WITHOUT AND WITH CONTRAST  Technique:  Multiplanar, multisequence MR imaging was performed both before and after administration of intravenous contrast.  Contrast: 20mL MULTIHANCE GADOBENATE DIMEGLUMINE 529 MG/ML IV SOLN  Comparison: Right foot radiographs.  Findings: There is a large soft tissue abscess involving the medial aspect of the  forefoot it is largely  between the first and second toe is extending along the dorsum of the first metatarsal.  As noted on the plain films there is extensive gas formation in the soft tissues.  No obvious changes of septic arthritis but there are findings for osteomyelitis involving the first metatarsal and first proximal phalanx.  There is associated diffuse cellulitis and myofasciitis.  No involvement of the forefoot or visualized hind foot bony structures.  IMPRESSION:  1.  Large soft tissue abscess involving the medial forefoot with gas formation. 2.  Osteomyelitis involving the first metatarsal and first proximal phalanx. 3.  Diffuse cellulitis and myofasciitis.  Original Report Authenticated By: P. Loralie Champagne, M.D.   Dg Foot 2 Views Left  10/15/2011  *RADIOLOGY REPORT*  Clinical Data: Penetrating wounds to the ball of the left foot, with worsening erythema and blistering.  LEFT FOOT - 2 VIEW  Comparison: None.  Findings: There is no evidence of fracture or dislocation.  No osseous erosions are seen to suggest osteomyelitis.  The joint spaces are preserved.  There is no evidence of talar subluxation; the subtalar joint is unremarkable in appearance.  Soft tissue swelling and defect are noted along the plantar aspect of the forefoot.  IMPRESSION:  1.  No evidence of fracture or dislocation.  No definite evidence for osteomyelitis, though if there is significant clinical concern for osteomyelitis, MRI could be considered for further evaluation. 2.  Soft tissue defect and swelling along the plantar aspect of the forefoot.  Original Report Authenticated By: Tonia Ghent, M.D.   Dg Foot 2 Views Right  10/15/2011  *RADIOLOGY REPORT*  Clinical Data: Penetrating wounds to the ball of the right foot from blisters, with diffuse erythema and pain.  RIGHT FOOT - 2 VIEW  Comparison: None.  Findings: There is no evidence of fracture or dislocation.  No osseous erosions are seen to suggest osteomyelitis.  The joint spaces are preserved.   There is no evidence of talar subluxation; the subtalar joint is unremarkable in appearance.  Significant diffuse soft tissue air is noted tracking about the first toe and dorsal to the forefoot, concerning for necrotizing fasciitis.  Underlying soft tissue defects are noted along the plantar aspect of the forefoot.  IMPRESSION:  1.  No evidence of fracture or dislocation.  No definite evidence for osteomyelitis, though if there is significant clinical concern for osteomyelitis, MRI could be considered for further evaluation. 2.  Significant diffuse soft tissue air about the first toe and dorsum of the forefoot, concerning for necrotizing fasciitis.  Findings were discussed with Earley Favor at 01:03 a.m. on 10/14/2011.  Original Report Authenticated By: Tonia Ghent, M.D.   Medications: Scheduled Meds:    . aspirin  325 mg Oral Daily  . ciprofloxacin  400 mg Intravenous Q8H  . docusate sodium  100 mg Oral BID  . feeding supplement  237 mL Oral BID BM  . insulin aspart  0-15 Units Subcutaneous TID WC  . insulin aspart  0-5 Units Subcutaneous QHS  . insulin glargine  30 Units Subcutaneous Q0700  . metoprolol tartrate  50 mg Oral BID  . metroNIDAZOLE  500 mg Oral Q8H  . pantoprazole  40 mg Oral Q1200  . rosuvastatin  20 mg Oral q1800  . senna  1 tablet Oral BID  . sodium chloride  500 mL Intravenous Once  . vancomycin  1,000 mg Intravenous Q12H  . DISCONTD: diltiazem  120 mg Oral Daily  . DISCONTD: metoprolol tartrate  50 mg Oral BID  . DISCONTD: piperacillin-tazobactam (ZOSYN)  IV  3.375 g Intravenous Q8H   Continuous Infusions:    . sodium chloride 150 mL/hr at 10/25/11 1730   PRN Meds:.acetaminophen, acetaminophen, HYDROmorphone, LORazepam, ondansetron (ZOFRAN) IV, ondansetron, oxyCODONE, polyethylene glycol  Wound culture from 10/14/2011  Specimen Description  WOUND RIGHT POSTERIOR FOOT   Special Requests  NONE   Gram Stain  NO WBC SEEN NO SQUAMOUS EPITHELIAL CELLS SEEN RARE  GRAM NEGATIVE RODS   Culture  ABUNDANT PSEUDOMONAS AERUGINOSA   Report Status  PENDING   Organism ID, Bacteria  PSEUDOMONAS AERUGINOSA   Resulting Agency  [facility]    Culture & Susceptibility     PSEUDOMONAS AERUGINOSA          Antibiotic  Sensitivity  Microscan  Status      CEFEPIME  Sensitive  2  Final      Method:  MIC      CEFTAZIDIME  Sensitive  <=1  Final      Method:  MIC      CIPROFLOXACIN  Sensitive  <=0.25  Final      Method:  MIC      GENTAMICIN  Sensitive  2  Final      Method:  MIC      IMIPENEM  Sensitive  <=1  Final      Method:  MIC      TOBRAMYCIN  Sensitive  <=1  Final      Method:  MIC       Comments  PSEUDOMONAS AERUGINOSA (MIC)        ABUNDANT PSEUDOMONAS AERUGINOSA    Interval history:  Patient is a 55 year old male with a history of CAD, diabetes hypertension, presented with bilateral foot ulcers and right soft tissue infection due to diabetes also on 10/15/2011. Orthopedics was consulted and patient was seen by Dr. Dorene Grebe. Patient underwent a right foot excisional debridement and ray amputation #1 and #2 on 10/17/2011. Per cultures showed Pseudomonas aeruginosa, abscess cultures on 10/17/2011 also showed Klebsiella and enterococcus During admission patient also went into atrial flutter, was found to have elevated troponins, was treated for NSTEMI/ demand ischemia with IV heparin and IV Cardizem. Rate has been well controlled now on on by mouth Cardizem and metoprolol. He remained on IV heparin until today when it was discontinued due to bleeding from the right foot wound. Per cardiology recommendations (Dr. Rennis Golden), patient can be started on xarelto for a month and [facility] as out-patient  Assessment/Plan: - Recurrent SIRS =- transfer to SDU and reculture - Severe thrombocytopenia - ? Cause . R/o DIC. ? Due to beta lactam abx. Transfuse platelets if bleeding.  No empiric platelest until we establish this is not HIT.   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 - on IV vanco and cipro and flagyl. Wound cultures growing pseudomonas,  abscess culture on 10/17/2011 shows Klebsiella oxytoca and enterococcus -Discussed with Dr. Dorene Grebe to inspect the wound today and recommendation regarding if he needs any further surgery. - Heparin drip discontinued. Stat H&H shows hemoglobin at 8.6, repeat in a.m. and transfuse if necessary  CAD (coronary artery disease), autologous vein bypass graft / s/p 2 stent placement: went into atrial flutter on admission was found to have elevated troponins, was  treated for NSTEMI/ Demand ischemia with IV heparin and IV Cardizem.now he is in  [facility].  - Continue with aspirin/ metoprolol/ crestor.    Atrial flutter with NSTEMI/demand ischemia: Followed by cardiology - Continue metoprolol - DC heparin drip. Due to low platelest  Will start the xarelto after the surgery if platelest recover - DCCV outpatient per Dr. Blanchie Dessert recommendations  Hypertension controlled  Diabetes Mellitus:  No further hypoglycemia episodes -  Continue insulin at current regimen, Continue carb modified diet  DVT prophylaxis: On heparin drip   Tommy Minichiello 10/25/2011, 5:59 PM

## 2011-10-25 NOTE — Progress Notes (Signed)
CRITICAL VALUE ALERT  Critical value received:  Platelets <5  Date of notification:  10/25/11  Time of notification:  1535  Critical value read back:yes  Nurse who received alert:  Marisa Cyphers RN  MD notified (1st page): Lavera Guise  Time of first page: 1540  MD notified (2nd page):  Time of second page:  Responding MD: Lavera Guise  Time MD responded:  [phone]

## 2011-10-25 NOTE — Progress Notes (Signed)
Physical Therapy Patient Details Name: [name] MRN: [id number] DOB: [birth year] Today's Date: 10/25/2011  Pt declining PT this afternoon, stating he is not feeling up to it.  Will try another time.    Sunny Schlein, [place] [phone] 10/25/2011, 2:48 PM

## 2011-10-25 NOTE — Progress Notes (Signed)
Subjective: Foot pain better, note profound thrombocytopenia today Objective: Weight change: -12 lb 4.6 oz (-5.572 kg)  Intake/Output Summary (Last 24 hours) at 10/25/11 0908 Last data filed at 10/25/11 0843  Gross per 24 hour  Intake   1490 ml  Output    965 ml  Net    525 ml   Blood pressure 127/59, pulse 72, temperature 98.5 F (36.9 C), temperature source Oral, resp. rate 19, height 5\' 7"  (1.702 m), weight 180 lb 1.9 oz (81.7 kg), SpO2 94.00%. Temp:  [98.5 F (36.9 C)-99.3 F (37.4 C)] 98.5 F (36.9 C) (12/12 0517) Pulse Rate:  [66-86] 72  (12/12 0517) Resp:  [19] 19  (12/12 0517) BP: (108-137)/(59-68) 127/59 mmHg (12/12 0517) SpO2:  [94 %-97 %] 94 % (12/12 0517) Weight:  [180 lb 1.9 oz (81.7 kg)] 180 lb 1.9 oz (81.7 kg) (12/12 0517)  Physical Exam: General: Alert and awake, oriented x3, not in any acute distress. HEENT: anicteric sclera, pupils reactive to light and accommodation, EOMI CVS regular rate, normal r,  no murmur rubs or gallops Chest: clear to auscultation bilaterally, no wheezing, rales or rhonchi Abdomen: soft nontender, nondistended, normal bowel sounds, Extremities: his left ankle with dressing  Lymph: no new lymphadenopathy Neuro: nonfocal  Lab Results:  Basename 10/25/11 0602 10/24/11 1137 10/24/11 0555  WBC 5.1 -- 3.5*  HGB 8.4* 8.6* --  HCT 26.6* 27.0* --  PLT 12* -- 39*   BMET  Basename 10/23/11 0540  NA 139  K 3.7  CL 98  CO2 33*  GLUCOSE 151*  BUN 10  CREATININE 1.30  CALCIUM 8.1*    Micro Results: Recent Results (from the past 240 hour(s))  SURGICAL PCR SCREEN     Status: Normal   Collection Time   10/17/11  7:51 PM      Component Value Range Status Comment   MRSA, PCR NEGATIVE  NEGATIVE  Final    Staphylococcus aureus NEGATIVE  NEGATIVE  Final   CULTURE, ROUTINE-ABSCESS     Status: Normal   Collection Time   10/17/11  9:30 PM      Component Value Range Status Comment   Specimen Description ABSCESS FOOT RIGHT   Final    Special Requests NONE   Final    Gram Stain     Final    Value: ABUNDANT WBC PRESENT,BOTH PMN AND MONONUCLEAR     RARE SQUAMOUS EPITHELIAL CELLS PRESENT     FEW GRAM POSITIVE COCCI IN PAIRS     RARE GRAM NEGATIVE RODS   Culture     Final    Value: FEW KLEBSIELLA OXYTOCA     MODERATE ENTEROCOCCUS SPECIES   Report Status 10/21/2011 FINAL   Final    Organism ID, Bacteria KLEBSIELLA OXYTOCA   Final    Organism ID, Bacteria ENTEROCOCCUS SPECIES   Final   ANAEROBIC CULTURE     Status: Normal   Collection Time   10/17/11  9:30 PM      Component Value Range Status Comment   Specimen Description ABSCESS FOOT RIGHT   Final    Special Requests NONE   Final    Gram Stain     Final    Value: ABUNDANT WBC PRESENT,BOTH PMN AND MONONUCLEAR     RARE SQUAMOUS EPITHELIAL CELLS PRESENT     FEW GRAM POSITIVE COCCI IN PAIRS     RARE GRAM NEGATIVE RODS   Culture     Final    Value: FEW BACTEROIDES FRAGILIS  Note: BETA LACTAMASE POSITIVE   Report Status 10/24/2011 FINAL   Final     Studies/Results: Ct Angio Chest W/cm &/or Wo Cm  10/15/2011  *RADIOLOGY REPORT*  Clinical Data:  Chest pain with shortness of breath and elevated D- dimer levels.  Diabetes.  Question pulmonary embolism.  CT ANGIOGRAPHY CHEST WITH CONTRAST  Technique:  Multidetector CT imaging of the chest was performed using the standard protocol during bolus administration of intravenous contrast.  Multiplanar CT image reconstructions including MIPs were obtained to evaluate the vascular anatomy.  Contrast: OMNIPAQUE IOHEXOL 300 MG/ML IV SOLN  Comparison:  None.  Findings:  The pulmonary arteries are well opacified with contrast. There is no evidence of acute pulmonary embolism.  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  The heart is mildly enlarged.  No enlarged mediastinal or hilar lymph nodes are demonstrated.  A pretracheal node demonstrates a fatty hilum.  There is no pleural or pericardial effusion.  The lungs are clear.  Images  through the upper abdomen demonstrate contour irregularity of the liver suspicious for cirrhosis.  The spleen appears mildly enlarged.  There are multiple small calcified gallstones.  Review of the MIP images confirms the above findings.  IMPRESSION:  1.  No evidence of acute pulmonary embolism or other acute chest process. 2.  Cholelithiasis. 3.  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  Correlate clinically. 4.  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  Original Report Authenticated By: Gerrianne Scale, M.D.   MRI:  Comparison: MRI 10/15/2011.  Findings: Since the prior examination, the patient has undergone  amputation at the level of the proximal diaphysis of the first and  second metatarsals.  Soft tissues demonstrate extensive edema and enhancement. There  appears be a soft tissue wound over the dorsum of the foot. There  is a rim enhancing fluid collection originating from the medial  margin of the residual base of the first metatarsal extending  distally to the end of the medial aspect of the foot. The  collection also extends over the dorsum of the foot at the level of  the mid metatarsals to the level of the fourth metatarsal. The  collection cannot be discretely measured but is approximately 4.7  cm long by up to 2.8 cm cranial-caudal by 2.5 cm transverse.  IMPRESSION:  1. Status post amputation at the level the bases of the first and  second metatarsals. No evidence of osteomyelitis is identified.  2. Persistent marked cellulitis with a rim enhancing fluid  collection compatible with abscess. As noted above, the collection  extends from approximately the medial aspect of the base of the  first metatarsal distally to the end of the medial aspect of the  foot. At the level of the mid metatarsals, the collection extends  over the dorsum of the foot laterally to the level of the fourth  metatarsal.   10/15/2011  *RADIOLOGY REPORT*  Clinical Data: Infected  diabetic foot ulcer.  MRI OF THE RIGHT FOREFOOT WITHOUT AND WITH CONTRAST  Technique:  Multiplanar, multisequence MR imaging was performed both before and after administration of intravenous contrast.  Contrast: 20mL MULTIHANCE GADOBENATE DIMEGLUMINE 529 MG/ML IV SOLN  Comparison: Right foot radiographs.  Findings: There is a large soft tissue abscess involving the medial aspect of the forefoot it is largely between the first and second toe is extending along the dorsum of the first metatarsal.  As noted on the plain films there is extensive gas formation in the soft tissues.  No  obvious changes of septic arthritis but there are findings for osteomyelitis involving the first metatarsal and first proximal phalanx.  There is associated diffuse cellulitis and myofasciitis.  No involvement of the forefoot or visualized hind foot bony structures.  IMPRESSION:  1.  Large soft tissue abscess involving the medial forefoot with gas formation. 2.  Osteomyelitis involving the first metatarsal and first proximal phalanx. 3.  Diffuse cellulitis and myofasciitis.  Original Report Authenticated By: P. Loralie Champagne, M.D.   Dg Foot 2 Views Left  10/15/2011  *RADIOLOGY REPORT*  Clinical Data: Penetrating wounds to the ball of the left foot, with worsening erythema and blistering.  LEFT FOOT - 2 VIEW  Comparison: None.  Findings: There is no evidence of fracture or dislocation.  No osseous erosions are seen to suggest osteomyelitis.  The joint spaces are preserved.  There is no evidence of talar subluxation; the subtalar joint is unremarkable in appearance.  Soft tissue swelling and defect are noted along the plantar aspect of the forefoot.  IMPRESSION:  1.  No evidence of fracture or dislocation.  No definite evidence for osteomyelitis, though if there is significant clinical concern for osteomyelitis, MRI could be considered for further evaluation. 2.  Soft tissue defect and swelling along the plantar aspect of the forefoot.   Original Report Authenticated By: Tonia Ghent, M.D.   Dg Foot 2 Views Right  10/15/2011  *RADIOLOGY REPORT*  Clinical Data: Penetrating wounds to the ball of the right foot from blisters, with diffuse erythema and pain.  RIGHT FOOT - 2 VIEW  Comparison: None.  Findings: There is no evidence of fracture or dislocation.  No osseous erosions are seen to suggest osteomyelitis.  The joint spaces are preserved.  There is no evidence of talar subluxation; the subtalar joint is unremarkable in appearance.  Significant diffuse soft tissue air is noted tracking about the first toe and dorsal to the forefoot, concerning for necrotizing fasciitis.  Underlying soft tissue defects are noted along the plantar aspect of the forefoot.  IMPRESSION:  1.  No evidence of fracture or dislocation.  No definite evidence for osteomyelitis, though if there is significant clinical concern for osteomyelitis, MRI could be considered for further evaluation. 2.  Significant diffuse soft tissue air about the first toe and dorsum of the forefoot, concerning for necrotizing fasciitis.  Findings were discussed with Earley Favor at 01:03 a.m. on 10/14/2011.  Original Report Authenticated By: Tonia Ghent, M.D.    Antibiotics:  Anti-infectives     Start     Dose/Rate Route Frequency Ordered Stop   10/25/11 0915   metroNIDAZOLE (FLAGYL) tablet 500 mg        500 mg Oral 3 times per day 10/25/11 0908     10/15/11 1000   vancomycin (VANCOCIN) IVPB 1000 mg/200 mL premix  Status:  Discontinued        1,000 mg 200 mL/hr over 60 Minutes Intravenous Every 12 hours 10/15/11 0715 10/23/11 1431   10/15/11 0800   piperacillin-tazobactam (ZOSYN) IVPB 3.375 g  Status:  Discontinued        3.375 g 12.5 mL/hr over 240 Minutes Intravenous 3 times per day 10/15/11 0715 10/25/11 0907   10/14/11 2300   vancomycin (VANCOCIN) IVPB 1000 mg/200 mL premix        1,000 mg 200 mL/hr over 60 Minutes Intravenous  Once 10/14/11 2256 10/15/11 0104           Medications: Scheduled Meds:    . aspirin  325 mg  Oral Daily  . diltiazem  120 mg Oral Daily  . docusate sodium  100 mg Oral BID  . insulin aspart  0-15 Units Subcutaneous TID WC  . insulin aspart  0-5 Units Subcutaneous QHS  . insulin glargine  30 Units Subcutaneous Q0700  . metoprolol tartrate  50 mg Oral BID  . metroNIDAZOLE  500 mg Oral Q8H  . pantoprazole  40 mg Oral Q1200  . rosuvastatin  20 mg Oral q1800  . senna  1 tablet Oral BID  . DISCONTD: piperacillin-tazobactam (ZOSYN)  IV  3.375 g Intravenous Q8H   Continuous Infusions:    . DISCONTD: heparin Stopped (10/24/11 0902)   PRN Meds:.acetaminophen, acetaminophen, HYDROmorphone, LORazepam, ondansetron (ZOFRAN) IV, ondansetron, oxyCODONE, polyethylene glycol  Assessment/Plan: [name] is a 55 y.o. 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 Dr August Saucer considering more aggressive proximal amputation. He has had progressively worsening thrombocytopenia over last several days to 12k today  1) Osteomyelitis with likely abscess: --I will change his antibiotics in case they are causing his TTPenia though HIT and other possibile culprits could be to blame  --DC ZOSYN, start cipro, flagyl and vancomycin to cover pseudomaons, klebsiella, enterococcus and b fragilis isolated from wound and abscess  --I think he is going to need further more definitive surgery, Dr August Saucer to come and examine pt again today, Dr. Evalina Field saw over weekend   2) Thrombocytopenia: DC BETA lactam abx changed in case this was culprit. I would also send HIT panel,    LOS: 11 days    Acey Lav 10/25/2011, 9:08 AM

## 2011-10-25 NOTE — Progress Notes (Signed)
CRITICAL VALUE ALERT  Critical value received: Platelets 12  Date of notification: 10/25/11  Time of notification: 0830  Critical value read back:yes  Nurse who received alert: Marisa Cyphers RN  MD notified (1st page):  Lavera Guise  Time of first page: 0845  MD notified (2nd page):  Time of second page:  Responding MD: Lavera Guise  Time MD responded: [phone]

## 2011-10-25 NOTE — Progress Notes (Signed)
Called report to [phone]- transferred pt per bed with all meds. Platelet count , 5. MD gave orders to transfer pt tot higher level of care. Temp po 102.6 po Blood Cultures pending.   Rachael Darby RN

## 2011-10-25 NOTE — Progress Notes (Signed)
ANTIBIOTIC CONSULT NOTE - FOLLOW UP  Pharmacy Consult for Cipro/Vancomycin Indication: Osteomyelitis with likely abscess  No Known Allergies  Patient Measurements: Height: 5\' 7"  (170.2 cm) Weight: 180 lb 1.9 oz (81.7 kg) IBW/kg (Calculated) : 66.1  Adjusted Body Weight:   Vital Signs: Temp: 98.5 F (36.9 C) (12/12 0517) Temp src: Oral (12/12 0517) BP: 127/59 mmHg (12/12 0517) Pulse Rate: 72  (12/12 0517) Intake/Output from previous day: 12/11 0701 - 12/12 0700 In: 890 [P.O.:840; IV Piggyback:50] Out: 1101 [Urine:1100; Stool:1] Intake/Output from this shift: Total I/O In: 600 [P.O.:600] Out: 265 [Urine:265]  Labs:  [facility] 10/25/11 0602 10/24/11 1137 10/24/11 0555 10/23/11 0540  WBC 5.1 -- 3.5* 5.7  HGB 8.4* 8.6* 8.7* --  PLT 12* -- 39* 62*  LABCREA -- -- -- --  CREATININE -- -- -- 1.30   Estimated Creatinine Clearance: 64.1 ml/min (by C-G formula based on Cr of 1.3). No results found for this basename: VANCOTROUGH:2,VANCOPEAK:2,VANCORANDOM:2,GENTTROUGH:2,GENTPEAK:2,GENTRANDOM:2,TOBRATROUGH:2,TOBRAPEAK:2,TOBRARND:2,AMIKACINPEAK:2,AMIKACINTROU:2,AMIKACIN:2, in the last 72 hours   Microbiology: Recent Results (from the past 720 hour(s))  WOUND CULTURE     Status: Normal   Collection Time   10/14/11 10:29 PM      Component Value Range Status Comment   Specimen Description WOUND RIGHT POSTERIOR FOOT   Final    Special Requests NONE   Final    Gram Stain     Final    Value: NO WBC SEEN     NO SQUAMOUS EPITHELIAL CELLS SEEN     RARE GRAM NEGATIVE RODS   Culture ABUNDANT PSEUDOMONAS AERUGINOSA   Final    Report Status 10/19/2011 FINAL   Final    Organism ID, Bacteria PSEUDOMONAS AERUGINOSA   Final   MRSA PCR SCREENING     Status: Normal   Collection Time   10/15/11  7:07 AM      Component Value Range Status Comment   MRSA by PCR NEGATIVE  NEGATIVE  Final   SURGICAL PCR SCREEN     Status: Normal   Collection Time   10/17/11  7:51 PM      Component Value Range  Status Comment   MRSA, PCR NEGATIVE  NEGATIVE  Final    Staphylococcus aureus NEGATIVE  NEGATIVE  Final   CULTURE, ROUTINE-ABSCESS     Status: Normal   Collection Time   10/17/11  9:30 PM      Component Value Range Status Comment   Specimen Description ABSCESS FOOT RIGHT   Final    Special Requests NONE   Final    Gram Stain     Final    Value: ABUNDANT WBC PRESENT,BOTH PMN AND MONONUCLEAR     RARE SQUAMOUS EPITHELIAL CELLS PRESENT     FEW GRAM POSITIVE COCCI IN PAIRS     RARE GRAM NEGATIVE RODS   Culture     Final    Value: FEW KLEBSIELLA OXYTOCA     MODERATE ENTEROCOCCUS SPECIES   Report Status 10/21/2011 FINAL   Final    Organism ID, Bacteria KLEBSIELLA OXYTOCA   Final    Organism ID, Bacteria ENTEROCOCCUS SPECIES   Final   ANAEROBIC CULTURE     Status: Normal   Collection Time   10/17/11  9:30 PM      Component Value Range Status Comment   Specimen Description ABSCESS FOOT RIGHT   Final    Special Requests NONE   Final    Gram Stain     Final    Value: ABUNDANT WBC PRESENT,BOTH PMN AND  MONONUCLEAR     RARE SQUAMOUS EPITHELIAL CELLS PRESENT     FEW GRAM POSITIVE COCCI IN PAIRS     RARE GRAM NEGATIVE RODS   Culture     Final    Value: FEW BACTEROIDES FRAGILIS     Note: BETA LACTAMASE POSITIVE   Report Status 10/24/2011 FINAL   Final     Anti-infectives     Start     Dose/Rate Route Frequency Ordered Stop   10/25/11 1200   ciprofloxacin (CIPRO) IVPB 400 mg        400 mg 200 mL/hr over 60 Minutes Intravenous Every 8 hours 10/25/11 0924     10/25/11 1100   vancomycin (VANCOCIN) IVPB 1000 mg/200 mL premix        1,000 mg 200 mL/hr over 60 Minutes Intravenous Every 12 hours 10/25/11 0922     10/25/11 1000   metroNIDAZOLE (FLAGYL) tablet 500 mg        500 mg Oral 3 times per day 10/25/11 0908     10/15/11 1000   vancomycin (VANCOCIN) IVPB 1000 mg/200 mL premix  Status:  Discontinued        1,000 mg 200 mL/hr over 60 Minutes Intravenous Every 12 hours 10/15/11 0715  10/23/11 1431   10/15/11 0800   piperacillin-tazobactam (ZOSYN) IVPB 3.375 g  Status:  Discontinued        3.375 g 12.5 mL/hr over 240 Minutes Intravenous 3 times per day 10/15/11 0715 10/25/11 0907   10/14/11 2300   vancomycin (VANCOCIN) IVPB 1000 mg/200 mL premix        1,000 mg 200 mL/hr over 60 Minutes Intravenous  Once 10/14/11 2256 10/15/11 0104          Assessment: 1)Osteo with possible abscess, currently on day 11 zosyn, given concern for TTP caused by beta lactam abx, will change to flagyl, vancomycin, and cipro. Will dose cipro more aggressively given severity of infection. Renal function has remained stable, but last scr was 12/10, will ensure renal function is still accurate with scr ordered for tomorrow am.  2)afib- rate controlled on dilt/bb. Currently off heparin after lost iv line yesterday and possible plan of surgery. Platelet ct now 12, HIT panel ordered. Daily CBC ordered, will defer to primary team on whether to d/c that lab at this time. Will follow along plan for anticoagulation and surgery.  Goal of Therapy:  Vancomycin trough level 15-20 mcg/ml  Plan:  Cipro 400 mg IV q 8 hours Vancomycin 1g iv q12 hours Flagyl 500mg  q 8 hours Scr with am labs F/u HIT panel  Severiano Gilbert 10/25/2011,9:39 AM

## 2011-10-25 NOTE — Progress Notes (Signed)
Nutrition Follow-up  Patient reports his appetite is fair, but improved. Per chart, patient with 100% intake of meals, but patient reports that he is eating only 50-75% of meals. He is willing to try a nutrition supplement.   Diet Order:  Carbohydrate modified-medium  Meds: Scheduled Meds:   . aspirin  325 mg Oral Daily  . ciprofloxacin  400 mg Intravenous Q8H  . diltiazem  120 mg Oral Daily  . docusate sodium  100 mg Oral BID  . insulin aspart  0-15 Units Subcutaneous TID WC  . insulin aspart  0-5 Units Subcutaneous QHS  . insulin glargine  30 Units Subcutaneous Q0700  . metoprolol tartrate  50 mg Oral BID  . metroNIDAZOLE  500 mg Oral Q8H  . pantoprazole  40 mg Oral Q1200  . rosuvastatin  20 mg Oral q1800  . senna  1 tablet Oral BID  . vancomycin  1,000 mg Intravenous Q12H  . DISCONTD: piperacillin-tazobactam (ZOSYN)  IV  3.375 g Intravenous Q8H   Continuous Infusions:  PRN Meds:.acetaminophen, acetaminophen, HYDROmorphone, LORazepam, ondansetron (ZOFRAN) IV, ondansetron, oxyCODONE, polyethylene glycol  Labs:  CMP     Component Value Date/Time   NA 139 10/23/2011 0540   K 3.7 10/23/2011 0540   CL 98 10/23/2011 0540   CO2 33* 10/23/2011 0540   GLUCOSE 151* 10/23/2011 0540   BUN 10 10/23/2011 0540   CREATININE 1.30 10/23/2011 0540   CALCIUM 8.1* 10/23/2011 0540   PROT 6.7 10/23/2011 0540   ALBUMIN 1.8* 10/23/2011 0540   AST 30 10/23/2011 0540   ALT 15 10/23/2011 0540   ALKPHOS 64 10/23/2011 0540   BILITOT 0.3 10/23/2011 0540   GFRNONAA 59* 10/23/2011 0540   GFRAA 69* 10/23/2011 0540     Intake/Output Summary (Last 24 hours) at 10/25/11 1441 Last data filed at 10/25/11 1309  Gross per 24 hour  Intake   1490 ml  Output    990 ml  Net    500 ml    Weight Status:  81.7 kg, down from 84.5 kg on 12/6  Nutrition Dx:  Malnutrition, ongoing, improved  Goal:  Patient to consume >75% of meals, not meeting consistently per patient  Intervention:   1. Ensure Immune,  chocolate BID between meals 2. Patient declines diet education at this time.   Monitor:  PO intake, weight, labs   [name] Pager #:  [phone]

## 2011-10-25 NOTE — Consult Note (Signed)
Reason for consult: Thrombocytopenia Consulting MD: Dr. Clelia Croft Date of Consultation: 10/25/2011  HPI: 55 y/o wm from [place], [place], admitted to [facility] 10[facility]14[facility]2011 with 1st toe and dorsum of R foot infection concerning for necrotizing fascitis. Platelets on admission were 123K. HH was nl at 14.4 and 41, and WBC was 10.4. Pt was placed on IV Vanco with Zosyn added thereafter, and IVF. In addition, Pt was tachycardic/AFlutter with EKG susp for NSTEMI (asymptomatic). Pt was placed on heparin per pharmacy. 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 August Saucer 12/4). Plts dropped to 106 on 12/3 and on 12/4 were 113k. Pts plt count continue to show steady decrease. On 12/5 were 110k. By then, he was on Heparin in preparation for possible DCCV for Afib, although it was eventually decided to treat clinically. Platelets recuperated slightly  to 114 on 12/6 and to 116 on 12/7. No acute bleed was noted during all this time. On 12/7, abscess culture (12/4) shows few Klebsiella oxytoca (resistant only to ampicillin) and moderate enterococcus species. As per ID note, Vanco/Zosyn were to continue. Platelets on 12/8 rose to 123 and on 12/9 to 124k. However, on 12/10, values dropped significantly to 62k. Heparin was d/c'd on 12/11 due to drop in platelets to 39 count as well as a decrease in Hb to 8.6 due to post op bleed and oozing from wound,along with other factors such as polypharmacy and IVF,   with plans to start pt on Xarelto as OP after DCCV Currently patient is on ASA qd. Today his plt count is 12k with no bleeding noted. Of note, Cipro has been added today to regimen. HIT panel pending. HIV negative. Hepatitis panel negative.  Creatinine 1.3  Sed rate 128.Of note, 12/2 CT angio of chest, neg for PE but incidental contour irregularity of the liver suspicious for cirrhosis and mildly enlarged spleen are seen.We were asked to see patient in consultation regarding  abnormal platelet count.  PMH: Past Medical History  Diagnosis Date  . CAD (coronary artery disease), autologous vein bypass graft       . HTN (hypertension)   . 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 Diabetes mellitus   . Anxiety/ Situational depression   . Headache   . Atrial flutter with rapid ventricular response 10/15/2011  . NSTEMI (non-ST elevated myocardial infarction) 10/17/2011    Surgeries: Past Surgical History  Procedure Date  . Coronary artery bypass graft 1980's [facility]   S/p card cath  1990's [facility]  . Amputation 10/17/2011    Procedure: AMPUTATION RAY;  Surgeon: Cammy Copa;  Location: [facility] OR;  Service: Orthopedics;  Laterality: Right;  First and Second Ray Amputation    Allergies: No Known Allergies  Medications:   Scheduled:    . aspirin  325 mg Oral Daily  . ciprofloxacin  400 mg Intravenous Q8H  . diltiazem  120 mg Oral Daily  . docusate sodium  100 mg Oral BID  . insulin aspart  0-15 Units Subcutaneous TID WC  . insulin aspart  0-5 Units Subcutaneous QHS  . insulin glargine  30 Units Subcutaneous Q0700  . metoprolol tartrate  50 mg Oral BID  . metroNIDAZOLE  500 mg Oral Q8H  . pantoprazole  40 mg Oral Q1200  . rosuvastatin  20 mg Oral q1800  . senna  1 tablet Oral BID  .  vancomycin  1,000 mg Intravenous Q12H  . DISCONTD: piperacillin-tazobactam (ZOSYN)  IV  3.375 g Intravenous Q8H   ZOX:WRUEAVWUJWJXB, acetaminophen, HYDROmorphone, LORazepam, ondansetron (ZOFRAN) IV, ondansetron, oxyCODONE, polyethylene glycol  JYN:WGNFAO of Systems  Constitutional: Positive for weight loss from 300 lbs to 180 over the last year according to pt attributed to situational depression. Low grade fevers fever, No chills and malaise/fatigue.  Occasional night sweats.  Eyes: Negative for blurred vision and double vision.  Respiratory: Negative for cough, hemoptysis. Mild shortness of breath.    Cardiovascular: Negative for chest pain. GI: No nausea, vomiting, diarrhea, constipation. No change in bowel caliber. No  Melena or  Hematochezia.  GU: No blood in urine. No loss of urinary control. Skin: Negative for itching. No rash. No petechia. Denies easy bruising . Pt R foot healing p amputation Neurological: No headaches. No motor or sensory deficits.  Family History: Family History  Problem Relation Age of Onset  . Alzheimer's disease Father   . Melanoma Father   . Benign prostatic hyperplasia Father   . Coronary artery disease Father     s/p CABG  . Heart attack Brother   . Coronary artery disease Brother     s/p CABG  . Lymphoma Sister     non Hodgkin's   . Aortic aneurysm (thoracic) Mother   . Anemia Mother     Social History:  reports that he quit smoking about 3 months ago. His smoking use included Cigarettes. He has a 17.5 pack-year smoking history. He does not have any smokeless tobacco history on file. He reports that he does not drink alcohol or use illicit drugs.Denies risk for hepatitis or HIV Pt is single, no children. Cares for father who has Alzheimer's disease over the last year. Former Office manager.  Physical Exam  55 y.o. male in no acute distress  A. and O. x3 HEENT: Normocephalic, atraumatic, PERRLA. Oral cavity without thrush or lesions. Pale. Neck: supple. no thyromegaly, no cervical or supraclavicular adenopathy  Lungs: clear bilaterally . No wheezing, rhonchi or rales. No axillary masses. Breasts: not examined. Cardiac:irregular rate and rhythm normal S1-S2, no murmur , rubs or gallops Abdomen: soft nontender , bowel sounds x4  no hepatosplenomegaly GU/rectal: deferred. Extremities: no clubbing cyanosis . No edema.No bruising or petechial rash. R foot bandaged with some erythema above dressing. Neurologic: A. and O. x3, no focal deficits       Labs  CBC   Lab 10/25/11 0602 10/24/11 1137 10/24/11 0555 10/23/11 0540 10/22/11  0610 10/21/11 0600  WBC 5.1 -- 3.5* 5.7 4.8 4.5  HGB 8.4* 8.6* 8.7* 10.7* 10.0* --  HCT 26.6* 27.0* 27.2* 33.7* 31.7* --  PLT 12* -- 39* 62* 124* 123*  MCV 82.6 -- 80.7 82.4 82.8 81.6  MCH 26.1 -- 25.8* 26.2 26.1 26.5  MCHC 31.6 -- 32.0 31.8 31.5 32.5  RDW 14.5 -- 14.4 14.4 14.3 14.0  LYMPHSABS -- -- -- -- -- --  MONOABS -- -- -- -- -- --  EOSABS -- -- -- -- -- --  BASOSABS -- -- -- -- -- --  BANDABS -- -- -- -- -- --    CMP    Lab 10/23/11 0540 10/21/11 2039 10/21/11 0600 10/20/11 0615 10/19/11 0032  NA 139 -- 134* 136 130*  K 3.7 5.8* 2.9* 3.1* 3.4*  CL 98 -- 95* 98 95*  CO2 33* -- 32 32 27  GLUCOSE 151* -- 198* 231* 275*  BUN 10 -- 8 7 10   CREATININE  1.30 -- 1.11 1.10 1.03  CALCIUM 8.1* -- 7.7* 7.6* 7.6*  MG -- -- -- -- --  AST 30 -- -- -- --  ALT 15 -- -- -- --  ALKPHOS 64 -- -- -- --  BILITOT 0.3 -- -- -- --        Component Value Date/Time   BILITOT 0.3 10/23/2011 0540         Component Value Date/Time   ESRSEDRATE 128* 10/20/2011 0615        Imaging Studies: No results found.      A/P: 54 y.o. male asked to see for evaluation of thrombocytopenia in the setting of infection, NSTEMI, Afib, surgery and Heparinization and multiple antibiotics. Check peripheral blood smear and DIC panel. Await HIT results.  Dr. Clelia Croft   is to see the patient following this consult with recommendations regarding diagnosis, treatment options and further workup studies.  Thank you for the referral.    Attending Addendum:  Patient seen and examined. Please see note above for details. 55 year old with DM and CAD admitted with cellulitis. Patient developed thrombocytopenia starting on 12/9 (drop from 124 to 62). Now his platelet count down to 12K. Patient report no bleeding at this time.  Smear reviewed: no evidence of RBC fragmentation. No schistocytes noted.  Normal WBC, decrease platelets noted. Hypochromia and microcytosis noted.  Impression and Plan:  1.  Thrombocytopenia: the etiology at this time likely medication. Possible agents include Zosyn (have been stopped), Heparin (HIT panel is pending) DIC, TTP, HUS are unlikely. I would await results of the HIT panel. I would avoid any heparin products at this time. I would hold off on anticoagulation at this time (if HIT is positive, I would use argatroban or lepirudin). No transfusion of platelet is needed at this time.  2. Anemia: likely Fe deficiency. I would check Fe levels and supplement as needed.

## 2011-10-26 LAB — CBC
HCT: 25.5 % — ABNORMAL LOW (ref 39.0–52.0)
MCH: 25.8 pg — ABNORMAL LOW (ref 26.0–34.0)
MCHC: 31.8 g/dL (ref 30.0–36.0)
MCV: 81.2 fL (ref 78.0–100.0)
RDW: 14.7 % (ref 11.5–15.5)

## 2011-10-26 LAB — BASIC METABOLIC PANEL
BUN: 19 mg/dL (ref 6–23)
Calcium: 7.4 mg/dL — ABNORMAL LOW (ref 8.4–10.5)
Creatinine, Ser: 1.26 mg/dL (ref 0.50–1.35)
GFR calc non Af Amer: 62 mL/min — ABNORMAL LOW (ref >90)
Glucose, Bld: 184 mg/dL — ABNORMAL HIGH (ref 70–99)

## 2011-10-26 LAB — GLUCOSE, CAPILLARY: Glucose-Capillary: 250 mg/dL — ABNORMAL HIGH (ref 70–99)

## 2011-10-26 MED ORDER — TIGECYCLINE 50 MG IV SOLR
100.0000 mg | Freq: Once | INTRAVENOUS | Status: AC
  Administered 2011-10-26: 18:00:00 100 mg via INTRAVENOUS
  Filled 2011-10-26: qty 100

## 2011-10-26 MED ORDER — L-METHYLFOLATE-B6-B12 3-35-2 MG PO TABS
1.0000 | ORAL_TABLET | Freq: Two times a day (BID) | ORAL | Status: DC
  Administered 2011-10-26 – 2011-10-27 (×3): 1 via ORAL
  Administered 2011-10-28: 10:00:00 via ORAL
  Administered 2011-10-28 – 2011-10-29 (×2): 1 via ORAL
  Administered 2011-10-29 – 2011-10-30 (×2): via ORAL
  Administered 2011-10-30 – 2011-10-31 (×2): 1 via ORAL
  Administered 2011-10-31: 08:00:00 via ORAL
  Administered 2011-11-01: 1 via ORAL
  Filled 2011-10-26 (×15): qty 1

## 2011-10-26 MED ORDER — TIGECYCLINE 50 MG IV SOLR
50.0000 mg | Freq: Two times a day (BID) | INTRAVENOUS | Status: DC
  Administered 2011-10-27 – 2011-11-01 (×11): 50 mg via INTRAVENOUS
  Filled 2011-10-26 (×12): qty 100

## 2011-10-26 MED ORDER — NITROGLYCERIN 0.3 MG/HR TD PT24
0.6000 mg | MEDICATED_PATCH | Freq: Every day | TRANSDERMAL | Status: DC
  Administered 2011-10-26 – 2011-10-31 (×6): 0.6 mg via TRANSDERMAL
  Filled 2011-10-26 (×9): qty 2

## 2011-10-26 MED ORDER — SILVER SULFADIAZINE 1 % EX CREA
1.0000 "application " | TOPICAL_CREAM | Freq: Every day | CUTANEOUS | Status: DC
  Administered 2011-10-26 – 2011-10-31 (×6): 1 via TOPICAL
  Filled 2011-10-26: qty 50

## 2011-10-26 NOTE — Consult Note (Signed)
Reason for Consult:ischemic wound right foot Referring Physician: Dr. Juline Patch is an 55 y.o. male.  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 CAD (coronary artery disease), autologous vein bypass graft     s/p CABG in 80's or 90's, then cath x2, last in the 90's with DES and BMS placed but not sure where. Dr. Lupita Shutter [facility]   . HTN (hypertension)   . 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 Diabetes mellitus   . Anxiety   . Headache   . Atrial flutter with rapid ventricular response 10/15/2011  . NSTEMI (non-ST elevated myocardial infarction) 10/17/2011    Past Surgical History  Procedure Date  . Coronary artery bypass graft   . No past surgeries   . Amputation 10/17/2011    Procedure: AMPUTATION RAY;  Surgeon: Cammy Copa;  Location: [facility] OR;  Service: Orthopedics;  Laterality: Right;  First and Second Ray Amputation    Family History  Problem Relation Age of Onset  . Alzheimer's disease Father   . Melanoma Father   . Benign prostatic hyperplasia Father   . Coronary artery disease Father     s/p CABG  . Heart attack Brother   . Coronary artery disease Brother     s/p CABG  . Lymphoma Sister     non Hodgkin's   . Aortic aneurysm Mother   . Anemia Mother     Social History:  reports that he quit smoking about 3 months ago. His smoking use included Cigarettes. He has a 17.5 pack-year smoking history. He does not have any smokeless tobacco history on file. He reports that he does not drink alcohol or use illicit drugs.  Allergies: No Known Allergies  Medications: I have reviewed the patient's current medications.  Results for orders placed during the hospital encounter of 10/14/11 (from the past 48 hour(s))  GLUCOSE, CAPILLARY     Status: Abnormal   Collection Time   10/24/11  9:20 PM      Component Value Range Comment   Glucose-Capillary 153 (*) 70 - 99 (mg/dL)   GLUCOSE, CAPILLARY     Status: Abnormal   Collection Time   10/25/11  5:56 AM      Component Value Range Comment   Glucose-Capillary 160 (*) 70 - 99 (mg/dL)   CBC     Status: Abnormal   Collection Time   10/25/11  6:02 AM      Component Value Range Comment   WBC 5.1  4.0 - 10.5 (K/uL)    RBC 3.22 (*) 4.22 - 5.81 (MIL/uL)    Hemoglobin 8.4 (*) 13.0 - 17.0 (g/dL)    HCT 16.1 (*) 09.6 - 52.0 (%)    MCV 82.6  78.0 - 100.0 (fL)    MCH 26.1  26.0 - 34.0 (pg)    MCHC 31.6  30.0 - 36.0 (g/dL)    RDW 04.5  40.9 - 81.1 (%)    Platelets 12 (*) 150 - 400 (K/uL)   GLUCOSE, CAPILLARY     Status: Abnormal   Collection Time   10/25/11 10:51 AM      Component Value Range Comment   Glucose-Capillary 168 (*) 70 - 99 (mg/dL)    Comment 1 Notify RN     DIC PANEL     Status: Abnormal   Collection Time  10/25/11  2:00 PM      Component Value Range Comment   Prothrombin Time 16.2 (*) 11.6 - 15.2 (seconds)    INR 1.27  0.00 - 1.49     aPTT 40 (*) 24 - 37 (seconds)    Fibrinogen 541 (*) 204 - 475 (mg/dL)    D-Dimer, Quant 1.61 (*) 0.00 - 0.48 (ug/mL-FEU)    Platelets <5 (*) 150 - 400 (K/uL)    Smear Review NO SCHISTOCYTES SEEN     SAVE SMEAR     Status: Normal   Collection Time   10/25/11  2:00 PM      Component Value Range Comment   Smear Review SMEAR STAINED AND AVAILABLE FOR REVIEW     GLUCOSE, CAPILLARY     Status: Abnormal   Collection Time   10/25/11  4:46 PM      Component Value Range Comment   Glucose-Capillary 115 (*) 70 - 99 (mg/dL)    Comment 1 Documented in Chart     BASIC METABOLIC PANEL     Status: Abnormal   Collection Time   10/25/11  6:13 PM      Component Value Range Comment   Sodium 132 (*) 135 - 145 (mEq/L)    Potassium 3.6  3.5 - 5.1 (mEq/L)    Chloride 95 (*) 96 - 112 (mEq/L)    CO2 29  19 - 32 (mEq/L)    Glucose, Bld 195 (*) 70 - 99 (mg/dL)    BUN 17  6 - 23 (mg/dL)    Creatinine, Ser 0.96  0.50 - 1.35 (mg/dL)     Calcium 7.5 (*) 8.4 - 10.5 (mg/dL)    GFR calc non Af Amer 65 (*) >90 (mL/min)    GFR calc Af Amer 76 (*) >90 (mL/min)   LACTIC ACID, PLASMA     Status: Normal   Collection Time   10/25/11  6:14 PM      Component Value Range Comment   Lactic Acid, Venous 1.8  0.5 - 2.2 (mmol/L)   PROCALCITONIN     Status: Normal   Collection Time   10/25/11  6:14 PM      Component Value Range Comment   Procalcitonin 0.32     GLUCOSE, CAPILLARY     Status: Abnormal   Collection Time   10/25/11  9:58 PM      Component Value Range Comment   Glucose-Capillary 178 (*) 70 - 99 (mg/dL)   CBC     Status: Abnormal   Collection Time   10/26/11  6:35 AM      Component Value Range Comment   WBC 3.9 (*) 4.0 - 10.5 (K/uL)    RBC 3.14 (*) 4.22 - 5.81 (MIL/uL)    Hemoglobin 8.1 (*) 13.0 - 17.0 (g/dL)    HCT 04.5 (*) 40.9 - 52.0 (%)    MCV 81.2  78.0 - 100.0 (fL)    MCH 25.8 (*) 26.0 - 34.0 (pg)    MCHC 31.8  30.0 - 36.0 (g/dL)    RDW 81.1  91.4 - 78.2 (%)    Platelets 14 (*) 150 - 400 (K/uL)   BASIC METABOLIC PANEL     Status: Abnormal   Collection Time   10/26/11  6:35 AM      Component Value Range Comment   Sodium 134 (*) 135 - 145 (mEq/L)    Potassium 3.6  3.5 - 5.1 (mEq/L)    Chloride 99  96 - 112 (mEq/L)  CO2 29  19 - 32 (mEq/L)    Glucose, Bld 184 (*) 70 - 99 (mg/dL)    BUN 19  6 - 23 (mg/dL)    Creatinine, Ser 1.61  0.50 - 1.35 (mg/dL)    Calcium 7.4 (*) 8.4 - 10.5 (mg/dL)    GFR calc non Af Amer 62 (*) >90 (mL/min)    GFR calc Af Amer 72 (*) >90 (mL/min)   GLUCOSE, CAPILLARY     Status: Abnormal   Collection Time   10/26/11  7:57 AM      Component Value Range Comment   Glucose-Capillary 165 (*) 70 - 99 (mg/dL)    Comment 1 Documented in Chart      Comment 2 Notify RN     GLUCOSE, CAPILLARY     Status: Abnormal   Collection Time   10/26/11 11:56 AM      Component Value Range Comment   Glucose-Capillary 177 (*) 70 - 99 (mg/dL)    Comment 1 Documented in Chart      Comment 2 Notify RN      GLUCOSE, CAPILLARY     Status: Abnormal   Collection Time   10/26/11  4:34 PM      Component Value Range Comment   Glucose-Capillary 250 (*) 70 - 99 (mg/dL)    Comment 1 Documented in Chart      Comment 2 Notify RN       No results found.  Review of Systems  All other systems reviewed and are negative.   Blood pressure 129/55, pulse 81, temperature 102.9 F (39.4 C), temperature source Oral, resp. rate 35, height 5\' 7"  (1.702 m), weight 83.8 kg (184 lb 11.9 oz), SpO2 93.00%. Physical Exam Patient with ischemic changes to right foot incision, palpable DP pulse, no cellulitis no abscess no streaking, scant bleeding Assessment/Plan: Ischemic wound right foot, no definite infection.  Start nitroglycerine patch to ankle to improve microcirculation, silvadine dressing change daily, and metanx, if no positive response would need BKA.  Maecyn Panning V 10/26/2011, 5:29 PM

## 2011-10-26 NOTE — Progress Notes (Signed)
Subjective: More interactive today. Ill appearing    Objective:  Weight change: 2.1 kg (4 lb 10.1 oz)  Intake/Output Summary (Last 24 hours) at 10/26/11 1620 Last data filed at 10/26/11 1400  Gross per 24 hour  Intake   3700 ml  Output   1300 ml  Net   2400 ml  T max 102 Blood pressure 137/58, pulse 70, temperature 98.7 F (37.1 C), temperature source Oral, resp. rate 16, height 5\' 7"  (1.702 m), weight 83.8 kg (184 lb 11.9 oz), SpO2 100.00%.  On Exam alert  Heart:S1S2 RRR, no murmurs Lungs:CTAB, no wheezing or rhonchi. ABD: soft non tender , bowel sounds present. Extremities. Right foot dressing intact  Lab Results: Results for orders placed during the hospital encounter of 10/14/11 (from the past 24 hour(s))  GLUCOSE, CAPILLARY     Status: Abnormal   Collection Time   10/25/11  4:46 PM      Component Value Range   Glucose-Capillary 115 (*) 70 - 99 (mg/dL)   Comment 1 Documented in Chart    BASIC METABOLIC PANEL     Status: Abnormal   Collection Time   10/25/11  6:13 PM      Component Value Range   Sodium 132 (*) 135 - 145 (mEq/L)   Potassium 3.6  3.5 - 5.1 (mEq/L)   Chloride 95 (*) 96 - 112 (mEq/L)   CO2 29  19 - 32 (mEq/L)   Glucose, Bld 195 (*) 70 - 99 (mg/dL)   BUN 17  6 - 23 (mg/dL)   Creatinine, Ser 1.61  0.50 - 1.35 (mg/dL)   Calcium 7.5 (*) 8.4 - 10.5 (mg/dL)   GFR calc non Af Amer 65 (*) >90 (mL/min)   GFR calc Af Amer 76 (*) >90 (mL/min)  LACTIC ACID, PLASMA     Status: Normal   Collection Time   10/25/11  6:14 PM      Component Value Range   Lactic Acid, Venous 1.8  0.5 - 2.2 (mmol/L)  PROCALCITONIN     Status: Normal   Collection Time   10/25/11  6:14 PM      Component Value Range   Procalcitonin 0.32    GLUCOSE, CAPILLARY     Status: Abnormal   Collection Time   10/25/11  9:58 PM      Component Value Range   Glucose-Capillary 178 (*) 70 - 99 (mg/dL)  CBC     Status: Abnormal   Collection Time   10/26/11  6:35 AM      Component Value Range   WBC 3.9 (*) 4.0 - 10.5 (K/uL)   RBC 3.14 (*) 4.22 - 5.81 (MIL/uL)   Hemoglobin 8.1 (*) 13.0 - 17.0 (g/dL)   HCT 09.6 (*) 04.5 - 52.0 (%)   MCV 81.2  78.0 - 100.0 (fL)   MCH 25.8 (*) 26.0 - 34.0 (pg)   MCHC 31.8  30.0 - 36.0 (g/dL)   RDW 40.9  81.1 - 91.4 (%)   Platelets 14 (*) 150 - 400 (K/uL)  BASIC METABOLIC PANEL     Status: Abnormal   Collection Time   10/26/11  6:35 AM      Component Value Range   Sodium 134 (*) 135 - 145 (mEq/L)   Potassium 3.6  3.5 - 5.1 (mEq/L)   Chloride 99  96 - 112 (mEq/L)   CO2 29  19 - 32 (mEq/L)   Glucose, Bld 184 (*) 70 - 99 (mg/dL)   BUN 19  6 - 23 (  mg/dL)   Creatinine, Ser 1.61  0.50 - 1.35 (mg/dL)   Calcium 7.4 (*) 8.4 - 10.5 (mg/dL)   GFR calc non Af Amer 62 (*) >90 (mL/min)   GFR calc Af Amer 72 (*) >90 (mL/min)  GLUCOSE, CAPILLARY     Status: Abnormal   Collection Time   10/26/11  7:57 AM      Component Value Range   Glucose-Capillary 165 (*) 70 - 99 (mg/dL)   Comment 1 Documented in Chart     Comment 2 Notify RN    GLUCOSE, CAPILLARY     Status: Abnormal   Collection Time   10/26/11 11:56 AM      Component Value Range   Glucose-Capillary 177 (*) 70 - 99 (mg/dL)   Comment 1 Documented in Chart     Comment 2 Notify RN       Micro Results: Recent Results (from the past 240 hour(s))  SURGICAL PCR SCREEN     Status: Normal   Collection Time   10/17/11  7:51 PM      Component Value Range Status Comment   MRSA, PCR NEGATIVE  NEGATIVE  Final    Staphylococcus aureus NEGATIVE  NEGATIVE  Final   CULTURE, ROUTINE-ABSCESS     Status: Normal   Collection Time   10/17/11  9:30 PM      Component Value Range Status Comment   Specimen Description ABSCESS FOOT RIGHT   Final    Special Requests NONE   Final    Gram Stain     Final    Value: ABUNDANT WBC PRESENT,BOTH PMN AND MONONUCLEAR     RARE SQUAMOUS EPITHELIAL CELLS PRESENT     FEW GRAM POSITIVE COCCI IN PAIRS     RARE GRAM NEGATIVE RODS   Culture     Final    Value: FEW KLEBSIELLA  OXYTOCA     MODERATE ENTEROCOCCUS SPECIES   Report Status 10/21/2011 FINAL   Final    Organism ID, Bacteria KLEBSIELLA OXYTOCA   Final    Organism ID, Bacteria ENTEROCOCCUS SPECIES   Final   ANAEROBIC CULTURE     Status: Normal   Collection Time   10/17/11  9:30 PM      Component Value Range Status Comment   Specimen Description ABSCESS FOOT RIGHT   Final    Special Requests NONE   Final    Gram Stain     Final    Value: ABUNDANT WBC PRESENT,BOTH PMN AND MONONUCLEAR     RARE SQUAMOUS EPITHELIAL CELLS PRESENT     FEW GRAM POSITIVE COCCI IN PAIRS     RARE GRAM NEGATIVE RODS   Culture     Final    Value: FEW BACTEROIDES FRAGILIS     Note: BETA LACTAMASE POSITIVE   Report Status 10/24/2011 FINAL   Final     Studies/Results: Ct Angio Chest W/cm &/or Wo Cm  10/15/2011  *RADIOLOGY REPORT*  Clinical Data:  Chest pain with shortness of breath and elevated D- dimer levels.  Diabetes.  Question pulmonary embolism.  CT ANGIOGRAPHY CHEST WITH CONTRAST  Technique:  Multidetector CT imaging of the chest was performed using the standard protocol during bolus administration of intravenous contrast.  Multiplanar CT image reconstructions including MIPs were obtained to evaluate the vascular anatomy.  Contrast: OMNIPAQUE IOHEXOL 300 MG/ML IV SOLN  Comparison:  None.  Findings:  The pulmonary arteries are well opacified with contrast. There is no evidence of acute pulmonary embolism.  There is  atherosclerosis status post CABG.  The heart is mildly enlarged.  No enlarged mediastinal or hilar lymph nodes are demonstrated.  A pretracheal node demonstrates a fatty hilum.  There is no pleural or pericardial effusion.  The lungs are clear.  Images through the upper abdomen demonstrate contour irregularity of the liver suspicious for cirrhosis.  The spleen appears mildly enlarged.  There are multiple small calcified gallstones.  Review of the MIP images confirms the above findings.  IMPRESSION:  1.  No evidence of  acute pulmonary embolism or other acute chest process. 2.  Cholelithiasis. 3.  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  Correlate clinically. 4.  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  Original Report Authenticated By: Gerrianne Scale, M.D.   Mr Foot Right W Wo Contrast  10/15/2011  *RADIOLOGY REPORT*  Clinical Data: Infected diabetic foot ulcer.  MRI OF THE RIGHT FOREFOOT WITHOUT AND WITH CONTRAST  Technique:  Multiplanar, multisequence MR imaging was performed both before and after administration of intravenous contrast.  Contrast: 20mL MULTIHANCE GADOBENATE DIMEGLUMINE 529 MG/ML IV SOLN  Comparison: Right foot radiographs.  Findings: There is a large soft tissue abscess involving the medial aspect of the forefoot it is largely between the first and second toe is extending along the dorsum of the first metatarsal.  As noted on the plain films there is extensive gas formation in the soft tissues.  No obvious changes of septic arthritis but there are findings for osteomyelitis involving the first metatarsal and first proximal phalanx.  There is associated diffuse cellulitis and myofasciitis.  No involvement of the forefoot or visualized hind foot bony structures.  IMPRESSION:  1.  Large soft tissue abscess involving the medial forefoot with gas formation. 2.  Osteomyelitis involving the first metatarsal and first proximal phalanx. 3.  Diffuse cellulitis and myofasciitis.  Original Report Authenticated By: P. Loralie Champagne, M.D.   Dg Foot 2 Views Left  10/15/2011  *RADIOLOGY REPORT*  Clinical Data: Penetrating wounds to the ball of the left foot, with worsening erythema and blistering.  LEFT FOOT - 2 VIEW  Comparison: None.  Findings: There is no evidence of fracture or dislocation.  No osseous erosions are seen to suggest osteomyelitis.  The joint spaces are preserved.  There is no evidence of talar subluxation; the subtalar joint is unremarkable in appearance.  Soft tissue swelling  and defect are noted along the plantar aspect of the forefoot.  IMPRESSION:  1.  No evidence of fracture or dislocation.  No definite evidence for osteomyelitis, though if there is significant clinical concern for osteomyelitis, MRI could be considered for further evaluation. 2.  Soft tissue defect and swelling along the plantar aspect of the forefoot.  Original Report Authenticated By: Tonia Ghent, M.D.   Dg Foot 2 Views Right  10/15/2011  *RADIOLOGY REPORT*  Clinical Data: Penetrating wounds to the ball of the right foot from blisters, with diffuse erythema and pain.  RIGHT FOOT - 2 VIEW  Comparison: None.  Findings: There is no evidence of fracture or dislocation.  No osseous erosions are seen to suggest osteomyelitis.  The joint spaces are preserved.  There is no evidence of talar subluxation; the subtalar joint is unremarkable in appearance.  Significant diffuse soft tissue air is noted tracking about the first toe and dorsal to the forefoot, concerning for necrotizing fasciitis.  Underlying soft tissue defects are noted along the plantar aspect of the forefoot.  IMPRESSION:  1.  No evidence of fracture or dislocation.  No  definite evidence for osteomyelitis, though if there is significant clinical concern for osteomyelitis, MRI could be considered for further evaluation. 2.  Significant diffuse soft tissue air about the first toe and dorsum of the forefoot, concerning for necrotizing fasciitis.  Findings were discussed with Earley Favor at 01:03 a.m. on 10/14/2011.  Original Report Authenticated By: Tonia Ghent, M.D.   Medications: Scheduled Meds:    . ciprofloxacin  400 mg Intravenous Q8H  . docusate sodium  100 mg Oral BID  . feeding supplement  237 mL Oral BID BM  . insulin aspart  0-15 Units Subcutaneous TID WC  . insulin aspart  0-5 Units Subcutaneous QHS  . insulin glargine  30 Units Subcutaneous Q0700  . metoprolol tartrate  50 mg Oral BID  . metroNIDAZOLE  500 mg Oral Q8H  .  pantoprazole  40 mg Oral Q1200  . rosuvastatin  20 mg Oral q1800  . senna  1 tablet Oral BID  . sodium chloride  500 mL Intravenous Once  . vancomycin  1,000 mg Intravenous Q12H  . DISCONTD: aspirin  325 mg Oral Daily   Continuous Infusions:    . sodium chloride 150 mL/hr at 10/26/11 1500   PRN Meds:.acetaminophen, acetaminophen, HYDROmorphone, LORazepam, ondansetron (ZOFRAN) IV, ondansetron, oxyCODONE, polyethylene glycol  Wound culture from 10/14/2011  Specimen Description  WOUND RIGHT POSTERIOR FOOT   Special Requests  NONE   Gram Stain  NO WBC SEEN NO SQUAMOUS EPITHELIAL CELLS SEEN RARE GRAM NEGATIVE RODS   Culture  ABUNDANT PSEUDOMONAS AERUGINOSA   Report Status  PENDING   Organism ID, Bacteria  PSEUDOMONAS AERUGINOSA   Resulting Agency  [facility]    Culture & Susceptibility     PSEUDOMONAS AERUGINOSA          Antibiotic  Sensitivity  Microscan  Status      CEFEPIME  Sensitive  2  Final      Method:  MIC      CEFTAZIDIME  Sensitive  <=1  Final      Method:  MIC      CIPROFLOXACIN  Sensitive  <=0.25  Final      Method:  MIC      GENTAMICIN  Sensitive  2  Final      Method:  MIC      IMIPENEM  Sensitive  <=1  Final      Method:  MIC      TOBRAMYCIN  Sensitive  <=1  Final      Method:  MIC       Comments  PSEUDOMONAS AERUGINOSA (MIC)        ABUNDANT PSEUDOMONAS AERUGINOSA    Interval history:  Patient is a 55 year old male with a history of CAD, diabetes hypertension, presented with bilateral foot ulcers and right soft tissue infection due to diabetes also on 10/15/2011. Orthopedics was consulted and patient was seen by Dr. Dorene Grebe. Patient underwent a right foot excisional debridement and ray amputation #1 and #2 on 10/17/2011. Per cultures showed Pseudomonas aeruginosa, abscess cultures on 10/17/2011 also showed Klebsiella oxytoca -non ESBL and enterococcus During admission patient also went into atrial flutter, was found to have elevated troponins, was treated  for NSTEMI/ demand ischemia with IV heparin and IV Cardizem. Rate has been well controlled now on on by mouth Cardizem and metoprolol. He remained on IV heparin until today when it was discontinued due to bleeding from the right foot wound. Per cardiology recommendations (Dr. Rennis Golden), patient can be started on xarelto for a  month and [facility] as out-patient  Assessment/Plan: - Recurrent SIRS =- transferred  to SDU on 10/25/11 and recultured  - Severe thrombocytopenia - ? Cause . Ruled out DIC. ? Due to beta lactam abx. Transfuse platelets if bleeding.  No empiric platelets until we establish this is not HIT. 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 Wound cultures growing pseudomonas,  abscess culture on 10/17/2011 shows Klebsiella oxytoca and enterococcus - Dr. Dorene Grebe following   CAD (coronary artery disease), autologous vein bypass graft / s/p 2 stent placement: went into atrial flutter on admission was found to have elevated troponins, was  treated for NSTEMI/ Demand ischemia with IV heparin and IV Cardizem.now he is in NSR.  - Continue with  metoprolol/ crestor.  - no aspirin due to severe thrombocytopenia   Atrial flutter with NSTEMI/demand ischemia: Followed by cardiology - Continue metoprolol - DC heparin drip due to low plateleta and bleeding on 10/24/11 Will start the xarelto after the surgery and if platelets recover for primary stroke prophylaxis. - D[facility]V outpatient per Dr. Blanchie Dessert recommendations  Hypertension controlled  Diabetes Mellitus:  No further hypoglycemia episodes -  Continue insulin at current regimen, Continue carb modified diet  DVT prophylaxis: On heparin drip   Jafari Mckillop 10/26/2011, 4:20 PM [id number]

## 2011-10-26 NOTE — Progress Notes (Signed)
Events noted. Temp noted over night.  Labs reviewed today: DIC panel negative.     Platelet  count dropped from 12 to <5 last night. Today are up to 14.     HIT panel is pending   Impression/Recs:  Thrombocytopenia: Likely medication related (zosyn vs. Heparin) counts maybe improving. Continue to mintor for now. No transfusion unless active bleeding noted. Agree with stopping ASA. No anticoagulation at this time.   Will Follow.

## 2011-10-26 NOTE — Progress Notes (Signed)
PT Cancellation Note  Pt refusing PT today secondary to feeling "horrible." Pt reported he would only get out of bed if a "weapon was pointed at him." PT will attempt again in the AM as nursing reporting he feels better in the AM. Thank you!  Tonika Eden (Beverely Pace) Carleene Mains PT, DPT Acute Rehabilitation [phone]

## 2011-10-26 NOTE — Progress Notes (Addendum)
Subjective: Patient thrombocytopenia dropped to <5k last night also fevers despite broadening to vanco, cipro flagyl  Objective: Weight change: 4 lb 10.1 oz (2.1 kg)  Intake/Output Summary (Last 24 hours) at 10/26/11 1317 Last data filed at 10/26/11 1149  Gross per 24 hour  Intake   3960 ml  Output   1400 ml  Net   2560 ml   Blood pressure 137/58, pulse 70, temperature 98.7 F (37.1 C), temperature source Oral, resp. rate 16, height 5\' 7"  (1.702 m), weight 184 lb 11.9 oz (83.8 kg), SpO2 100.00%. Temp:  [98.6 F (37 C)-102.8 F (39.3 C)] 98.7 F (37.1 C) (12/13 1120) Pulse Rate:  [59-82] 70  (12/13 1120) Resp:  [13-40] 16  (12/13 1120) BP: (101-153)/(51-72) 137/58 mmHg (12/13 1120) SpO2:  [92 %-100 %] 100 % (12/13 1120) FiO2 (%):  [2 %] 2 % (12/12 2000) Weight:  [184 lb 11.9 oz (83.8 kg)] 184 lb 11.9 oz (83.8 kg) (12/13 0500)  Physical Exam: General: Alert and awake, oriented x3, not in any acute distress. HEENT: anicteric sclera, pupils reactive to light and accommodation, EOMI CVS regular rate, normal r,  no murmur rubs or gallops Chest: clear to auscultation bilaterally, no wheezing, rales or rhonchi Abdomen: soft nontender, nondistended, normal bowel sounds, Extremities: his left ankle with dressing Skin: echymoses  Lymph: no new lymphadenopathy Neuro: nonfocal  Lab Results:  Basename 10/26/11 0635 10/25/11 1400 10/25/11 0602  WBC 3.9* -- 5.1  HGB 8.1* -- 8.4*  HCT 25.5* -- 26.6*  PLT 14* <5* --   BMET  Basename 10/26/11 0635 10/25/11 1813  NA 134* 132*  K 3.6 3.6  CL 99 95*  CO2 29 29  GLUCOSE 184* 195*  BUN 19 17  CREATININE 1.26 1.20  CALCIUM 7.4* 7.5*    Micro Results: Recent Results (from the past 240 hour(s))  SURGICAL PCR SCREEN     Status: Normal   Collection Time   10/17/11  7:51 PM      Component Value Range Status Comment   MRSA, PCR NEGATIVE  NEGATIVE  Final    Staphylococcus aureus NEGATIVE  NEGATIVE  Final   CULTURE, ROUTINE-ABSCESS      Status: Normal   Collection Time   10/17/11  9:30 PM      Component Value Range Status Comment   Specimen Description ABSCESS FOOT RIGHT   Final    Special Requests NONE   Final    Gram Stain     Final    Value: ABUNDANT WBC PRESENT,BOTH PMN AND MONONUCLEAR     RARE SQUAMOUS EPITHELIAL CELLS PRESENT     FEW GRAM POSITIVE COCCI IN PAIRS     RARE GRAM NEGATIVE RODS   Culture     Final    Value: FEW KLEBSIELLA OXYTOCA     MODERATE ENTEROCOCCUS SPECIES   Report Status 10/21/2011 FINAL   Final    Organism ID, Bacteria KLEBSIELLA OXYTOCA   Final    Organism ID, Bacteria ENTEROCOCCUS SPECIES   Final   ANAEROBIC CULTURE     Status: Normal   Collection Time   10/17/11  9:30 PM      Component Value Range Status Comment   Specimen Description ABSCESS FOOT RIGHT   Final    Special Requests NONE   Final    Gram Stain     Final    Value: ABUNDANT WBC PRESENT,BOTH PMN AND MONONUCLEAR     RARE SQUAMOUS EPITHELIAL CELLS PRESENT     FEW Romie Minus  POSITIVE COCCI IN PAIRS     RARE GRAM NEGATIVE RODS   Culture     Final    Value: FEW BACTEROIDES FRAGILIS     Note: BETA LACTAMASE POSITIVE   Report Status 10/24/2011 FINAL   Final     Studies/Results: Ct Angio Chest W/cm &/or Wo Cm  10/15/2011  *RADIOLOGY REPORT*  Clinical Data:  Chest pain with shortness of breath and elevated D- dimer levels.  Diabetes.  Question pulmonary embolism.  CT ANGIOGRAPHY CHEST WITH CONTRAST  Technique:  Multidetector CT imaging of the chest was performed using the standard protocol during bolus administration of intravenous contrast.  Multiplanar CT image reconstructions including MIPs were obtained to evaluate the vascular anatomy.  Contrast: OMNIPAQUE IOHEXOL 300 MG/ML IV SOLN  Comparison:  None.  Findings:  The pulmonary arteries are well opacified with contrast. There is no evidence of acute pulmonary embolism.  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  The heart is mildly enlarged.  No enlarged mediastinal or hilar  lymph nodes are demonstrated.  A pretracheal node demonstrates a fatty hilum.  There is no pleural or pericardial effusion.  The lungs are clear.  Images through the upper abdomen demonstrate contour irregularity of the liver suspicious for cirrhosis.  The spleen appears mildly enlarged.  There are multiple small calcified gallstones.  Review of the MIP images confirms the above findings.  IMPRESSION:  1.  No evidence of acute pulmonary embolism or other acute chest process. 2.  Cholelithiasis. 3.  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  Correlate clinically. 4.  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  Original Report Authenticated By: Gerrianne Scale, M.D.   MRI:  Comparison: MRI 10/15/2011.  Findings: Since the prior examination, the patient has undergone  amputation at the level of the proximal diaphysis of the first and  second metatarsals.  Soft tissues demonstrate extensive edema and enhancement. There  appears be a soft tissue wound over the dorsum of the foot. There  is a rim enhancing fluid collection originating from the medial  margin of the residual base of the first metatarsal extending  distally to the end of the medial aspect of the foot. The  collection also extends over the dorsum of the foot at the level of  the mid metatarsals to the level of the fourth metatarsal. The  collection cannot be discretely measured but is approximately 4.7  cm long by up to 2.8 cm cranial-caudal by 2.5 cm transverse.  IMPRESSION:  1. Status post amputation at the level the bases of the first and  second metatarsals. No evidence of osteomyelitis is identified.  2. Persistent marked cellulitis with a rim enhancing fluid  collection compatible with abscess. As noted above, the collection  extends from approximately the medial aspect of the base of the  first metatarsal distally to the end of the medial aspect of the  foot. At the level of the mid metatarsals, the collection  extends  over the dorsum of the foot laterally to the level of the fourth  metatarsal.   10/15/2011  *RADIOLOGY REPORT*  Clinical Data: Infected diabetic foot ulcer.  MRI OF THE RIGHT FOREFOOT WITHOUT AND WITH CONTRAST  Technique:  Multiplanar, multisequence MR imaging was performed both before and after administration of intravenous contrast.  Contrast: 20mL MULTIHANCE GADOBENATE DIMEGLUMINE 529 MG/ML IV SOLN  Comparison: Right foot radiographs.  Findings: There is a large soft tissue abscess involving the medial aspect of the forefoot it is largely between the  first and second toe is extending along the dorsum of the first metatarsal.  As noted on the plain films there is extensive gas formation in the soft tissues.  No obvious changes of septic arthritis but there are findings for osteomyelitis involving the first metatarsal and first proximal phalanx.  There is associated diffuse cellulitis and myofasciitis.  No involvement of the forefoot or visualized hind foot bony structures.  IMPRESSION:  1.  Large soft tissue abscess involving the medial forefoot with gas formation. 2.  Osteomyelitis involving the first metatarsal and first proximal phalanx. 3.  Diffuse cellulitis and myofasciitis.  Original Report Authenticated By: P. Loralie Champagne, M.D.   Dg Foot 2 Views Left  10/15/2011  *RADIOLOGY REPORT*  Clinical Data: Penetrating wounds to the ball of the left foot, with worsening erythema and blistering.  LEFT FOOT - 2 VIEW  Comparison: None.  Findings: There is no evidence of fracture or dislocation.  No osseous erosions are seen to suggest osteomyelitis.  The joint spaces are preserved.  There is no evidence of talar subluxation; the subtalar joint is unremarkable in appearance.  Soft tissue swelling and defect are noted along the plantar aspect of the forefoot.  IMPRESSION:  1.  No evidence of fracture or dislocation.  No definite evidence for osteomyelitis, though if there is significant clinical  concern for osteomyelitis, MRI could be considered for further evaluation. 2.  Soft tissue defect and swelling along the plantar aspect of the forefoot.  Original Report Authenticated By: Tonia Ghent, M.D.   Dg Foot 2 Views Right  10/15/2011  *RADIOLOGY REPORT*  Clinical Data: Penetrating wounds to the ball of the right foot from blisters, with diffuse erythema and pain.  RIGHT FOOT - 2 VIEW  Comparison: None.  Findings: There is no evidence of fracture or dislocation.  No osseous erosions are seen to suggest osteomyelitis.  The joint spaces are preserved.  There is no evidence of talar subluxation; the subtalar joint is unremarkable in appearance.  Significant diffuse soft tissue air is noted tracking about the first toe and dorsal to the forefoot, concerning for necrotizing fasciitis.  Underlying soft tissue defects are noted along the plantar aspect of the forefoot.  IMPRESSION:  1.  No evidence of fracture or dislocation.  No definite evidence for osteomyelitis, though if there is significant clinical concern for osteomyelitis, MRI could be considered for further evaluation. 2.  Significant diffuse soft tissue air about the first toe and dorsum of the forefoot, concerning for necrotizing fasciitis.  Findings were discussed with Earley Favor at 01:03 a.m. on 10/14/2011.  Original Report Authenticated By: Tonia Ghent, M.D.    Antibiotics:  Anti-infectives     Start     Dose/Rate Route Frequency Ordered Stop   10/25/11 1200   ciprofloxacin (CIPRO) IVPB 400 mg        400 mg 200 mL/hr over 60 Minutes Intravenous Every 8 hours 10/25/11 0924     10/25/11 1100   vancomycin (VANCOCIN) IVPB 1000 mg/200 mL premix        1,000 mg 200 mL/hr over 60 Minutes Intravenous Every 12 hours 10/25/11 0922     10/25/11 1000   metroNIDAZOLE (FLAGYL) tablet 500 mg        500 mg Oral 3 times per day 10/25/11 0908     10/15/11 1000   vancomycin (VANCOCIN) IVPB 1000 mg/200 mL premix  Status:  Discontinued         1,000 mg 200 mL/hr over 60 Minutes Intravenous  Every 12 hours 10/15/11 0715 10/23/11 1431   10/15/11 0800   piperacillin-tazobactam (ZOSYN) IVPB 3.375 g  Status:  Discontinued        3.375 g 12.5 mL/hr over 240 Minutes Intravenous 3 times per day 10/15/11 0715 10/25/11 0907   10/14/11 2300   vancomycin (VANCOCIN) IVPB 1000 mg/200 mL premix        1,000 mg 200 mL/hr over 60 Minutes Intravenous  Once 10/14/11 2256 10/15/11 0104          Medications: Scheduled Meds:    . aspirin  325 mg Oral Daily  . ciprofloxacin  400 mg Intravenous Q8H  . docusate sodium  100 mg Oral BID  . feeding supplement  237 mL Oral BID BM  . insulin aspart  0-15 Units Subcutaneous TID WC  . insulin aspart  0-5 Units Subcutaneous QHS  . insulin glargine  30 Units Subcutaneous Q0700  . metoprolol tartrate  50 mg Oral BID  . metroNIDAZOLE  500 mg Oral Q8H  . pantoprazole  40 mg Oral Q1200  . rosuvastatin  20 mg Oral q1800  . senna  1 tablet Oral BID  . sodium chloride  500 mL Intravenous Once  . vancomycin  1,000 mg Intravenous Q12H  . DISCONTD: diltiazem  120 mg Oral Daily  . DISCONTD: metoprolol tartrate  50 mg Oral BID   Continuous Infusions:    . sodium chloride 150 mL/hr at 10/26/11 1000   PRN Meds:.acetaminophen, acetaminophen, HYDROmorphone, LORazepam, ondansetron (ZOFRAN) IV, ondansetron, oxyCODONE, polyethylene glycol  Assessment/Plan: [name] is a 55 y.o. 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 Dr August Saucer considering more aggressive proximal amputation. He has had progressively worsening thrombocytopenia over last several days to less than 5k last night  1) Osteomyelitis with likely abscess: --continue vanco, cipro and flagyl -Fevers bother me and I worry that he is going to need more proximal amputation soon   2) Thrombocytopenia:  BETA lactam  dcd abx changed in case this was culprit.HIT panel pending. Heme following  LOS: 12 days    Acey Lav 10/26/2011, 1:17 PM  PATIENT AGAIN FEBRILE. I CONSIDERED ADDING AZTREONAM WHICH WAS MY INITIAL RECOMMENDATION TO DR. LAZA, BUT DUE TO CONCERNS FOR CROSS REACTIVITY BETWEEN MONOBACTAM AND BETA LACTAM DECIDED AGAINST GOING TO AZTREONAM. I WILL CHANGE FROM VANCO, FLAGYL AND CIPRO TO TYGACIL (COVER MRSA, ANEROBES, CRE ETC) AND CIPRO. I DONT THINK THIS IS AN ANTIBIOTIC COVERAGE ISSUE. I THINK PT NEEDS BKA AND THAT HE WILL CONTINUE TO FEVER UNTIL THAT IS ACCOMPLISHED. ONE COULD CONSIDER ADDING AMINOGLYCOSIDE TO GIVE ADDITIONAL PSEUDMONAL COVERAGE BUT HE HAS NOT GROWN RESISTANT BUGS, HE SIMPLY HAS BAD TISSUE AND I BELIEVE CLEARLY NEEDS BKA NOW

## 2011-10-27 DIAGNOSIS — M869 Osteomyelitis, unspecified: Secondary | ICD-10-CM

## 2011-10-27 LAB — BASIC METABOLIC PANEL
BUN: 21 mg/dL (ref 6–23)
Creatinine, Ser: 1.25 mg/dL (ref 0.50–1.35)
GFR calc Af Amer: 72 mL/min — ABNORMAL LOW (ref >90)
GFR calc non Af Amer: 62 mL/min — ABNORMAL LOW (ref >90)
Potassium: 3.5 mEq/L (ref 3.5–5.1)

## 2011-10-27 LAB — CBC
HCT: 23.7 % — ABNORMAL LOW (ref 39.0–52.0)
MCHC: 31.6 g/dL (ref 30.0–36.0)
MCV: 81.7 fL (ref 78.0–100.0)
RDW: 15.1 % (ref 11.5–15.5)

## 2011-10-27 LAB — GLUCOSE, CAPILLARY: Glucose-Capillary: 127 mg/dL — ABNORMAL HIGH (ref 70–99)

## 2011-10-27 NOTE — Progress Notes (Signed)
Subjective: More interactive today. No new complains  Objective:  Weight change: 1.1 kg (2 lb 6.8 oz)  Intake/Output Summary (Last 24 hours) at 10/27/11 1555 Last data filed at 10/27/11 1300  Gross per 24 hour  Intake 2037.5 ml  Output   1626 ml  Net  411.5 ml  T max 102 Blood pressure 115/52, pulse 64, temperature 98.2 F (36.8 C), temperature source Oral, resp. rate 17, height 5\' 7"  (1.702 m), weight 84.9 kg (187 lb 2.7 oz), SpO2 98.00%.  On Exam alert  Heart:S1S2 RRR, no murmurs Lungs:CTAB, no wheezing or rhonchi. ABD: soft non tender , bowel sounds present. Extremities. Right foot dressing intact  Skin with purpura on the right arm   Lab Results: Results for orders placed during the hospital encounter of 10/14/11 (from the past 24 hour(s))  GLUCOSE, CAPILLARY     Status: Abnormal   Collection Time   10/26/11  4:34 PM      Component Value Range   Glucose-Capillary 250 (*) 70 - 99 (mg/dL)   Comment 1 Documented in Chart     Comment 2 Notify RN    GLUCOSE, CAPILLARY     Status: Abnormal   Collection Time   10/26/11  9:43 PM      Component Value Range   Glucose-Capillary 168 (*) 70 - 99 (mg/dL)  CBC     Status: Abnormal   Collection Time   10/27/11  5:27 AM      Component Value Range   WBC 3.3 (*) 4.0 - 10.5 (K/uL)   RBC 2.90 (*) 4.22 - 5.81 (MIL/uL)   Hemoglobin 7.5 (*) 13.0 - 17.0 (g/dL)   HCT 04.5 (*) 40.9 - 52.0 (%)   MCV 81.7  78.0 - 100.0 (fL)   MCH 25.9 (*) 26.0 - 34.0 (pg)   MCHC 31.6  30.0 - 36.0 (g/dL)   RDW 81.1  91.4 - 78.2 (%)   Platelets 26 (*) 150 - 400 (K/uL)  BASIC METABOLIC PANEL     Status: Abnormal   Collection Time   10/27/11  5:27 AM      Component Value Range   Sodium 134 (*) 135 - 145 (mEq/L)   Potassium 3.5  3.5 - 5.1 (mEq/L)   Chloride 98  96 - 112 (mEq/L)   CO2 28  19 - 32 (mEq/L)   Glucose, Bld 194 (*) 70 - 99 (mg/dL)   BUN 21  6 - 23 (mg/dL)   Creatinine, Ser 9.56  0.50 - 1.35 (mg/dL)   Calcium 7.1 (*) 8.4 - 10.5 (mg/dL)   GFR calc non Af Amer 62 (*) >90 (mL/min)   GFR calc Af Amer 72 (*) >90 (mL/min)  GLUCOSE, CAPILLARY     Status: Abnormal   Collection Time   10/27/11  7:31 AM      Component Value Range   Glucose-Capillary 161 (*) 70 - 99 (mg/dL)  GLUCOSE, CAPILLARY     Status: Abnormal   Collection Time   10/27/11 12:41 PM      Component Value Range   Glucose-Capillary 127 (*) 70 - 99 (mg/dL)     Micro Results: Recent Results (from the past 240 hour(s))  SURGICAL PCR SCREEN     Status: Normal   Collection Time   10/17/11  7:51 PM      Component Value Range Status Comment   MRSA, PCR NEGATIVE  NEGATIVE  Final    Staphylococcus aureus NEGATIVE  NEGATIVE  Final   CULTURE, ROUTINE-ABSCESS  Status: Normal   Collection Time   10/17/11  9:30 PM      Component Value Range Status Comment   Specimen Description ABSCESS FOOT RIGHT   Final    Special Requests NONE   Final    Gram Stain     Final    Value: ABUNDANT WBC PRESENT,BOTH PMN AND MONONUCLEAR     RARE SQUAMOUS EPITHELIAL CELLS PRESENT     FEW GRAM POSITIVE COCCI IN PAIRS     RARE GRAM NEGATIVE RODS   Culture     Final    Value: FEW KLEBSIELLA OXYTOCA     MODERATE ENTEROCOCCUS SPECIES   Report Status 10/21/2011 FINAL   Final    Organism ID, Bacteria KLEBSIELLA OXYTOCA   Final    Organism ID, Bacteria ENTEROCOCCUS SPECIES   Final   ANAEROBIC CULTURE     Status: Normal   Collection Time   10/17/11  9:30 PM      Component Value Range Status Comment   Specimen Description ABSCESS FOOT RIGHT   Final    Special Requests NONE   Final    Gram Stain     Final    Value: ABUNDANT WBC PRESENT,BOTH PMN AND MONONUCLEAR     RARE SQUAMOUS EPITHELIAL CELLS PRESENT     FEW GRAM POSITIVE COCCI IN PAIRS     RARE GRAM NEGATIVE RODS   Culture     Final    Value: FEW BACTEROIDES FRAGILIS     Note: BETA LACTAMASE POSITIVE   Report Status 10/24/2011 FINAL   Final   CULTURE, BLOOD (ROUTINE X 2)     Status: Normal (Preliminary result)   Collection Time    10/25/11  6:20 PM      Component Value Range Status Comment   Specimen Description BLOOD RIGHT ARM   Final    Special Requests BOTTLES DRAWN AEROBIC AND ANAEROBIC 10CC   Final    Setup Time [id number]   Final    Culture     Final    Value:        BLOOD CULTURE RECEIVED NO GROWTH TO DATE CULTURE WILL BE HELD FOR 5 DAYS BEFORE ISSUING A FINAL NEGATIVE REPORT   Report Status PENDING   Incomplete   CULTURE, BLOOD (ROUTINE X 2)     Status: Normal (Preliminary result)   Collection Time   10/25/11  6:35 PM      Component Value Range Status Comment   Specimen Description BLOOD LEFT HAND   Final    Special Requests BOTTLES DRAWN AEROBIC AND ANAEROBIC 10CC   Final    Setup Time [id number]   Final    Culture     Final    Value:        BLOOD CULTURE RECEIVED NO GROWTH TO DATE CULTURE WILL BE HELD FOR 5 DAYS BEFORE ISSUING A FINAL NEGATIVE REPORT   Report Status PENDING   Incomplete     Studies/Results: Ct Angio Chest W/cm &/or Wo Cm  10/15/2011  *RADIOLOGY REPORT*  Clinical Data:  Chest pain with shortness of breath and elevated D- dimer levels.  Diabetes.  Question pulmonary embolism.  CT ANGIOGRAPHY CHEST WITH CONTRAST  Technique:  Multidetector CT imaging of the chest was performed using the standard protocol during bolus administration of intravenous contrast.  Multiplanar CT image reconstructions including MIPs were obtained to evaluate the vascular anatomy.  Contrast: OMNIPAQUE IOHEXOL 300 MG/ML IV SOLN  Comparison:  None.  Findings:  The pulmonary arteries  are well opacified with contrast. There is no evidence of acute pulmonary embolism.  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  The heart is mildly enlarged.  No enlarged mediastinal or hilar lymph nodes are demonstrated.  A pretracheal node demonstrates a fatty hilum.  There is no pleural or pericardial effusion.  The lungs are clear.  Images through the upper abdomen demonstrate contour irregularity of the liver suspicious for  cirrhosis.  The spleen appears mildly enlarged.  There are multiple small calcified gallstones.  Review of the MIP images confirms the above findings.  IMPRESSION:  1.  No evidence of acute pulmonary embolism or other acute chest process. 2.  Cholelithiasis. 3.  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  Correlate clinically. 4.  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  Original Report Authenticated By: Gerrianne Scale, M.D.   Mr Foot Right W Wo Contrast  10/15/2011  *RADIOLOGY REPORT*  Clinical Data: Infected diabetic foot ulcer.  MRI OF THE RIGHT FOREFOOT WITHOUT AND WITH CONTRAST  Technique:  Multiplanar, multisequence MR imaging was performed both before and after administration of intravenous contrast.  Contrast: 20mL MULTIHANCE GADOBENATE DIMEGLUMINE 529 MG/ML IV SOLN  Comparison: Right foot radiographs.  Findings: There is a large soft tissue abscess involving the medial aspect of the forefoot it is largely between the first and second toe is extending along the dorsum of the first metatarsal.  As noted on the plain films there is extensive gas formation in the soft tissues.  No obvious changes of septic arthritis but there are findings for osteomyelitis involving the first metatarsal and first proximal phalanx.  There is associated diffuse cellulitis and myofasciitis.  No involvement of the forefoot or visualized hind foot bony structures.  IMPRESSION:  1.  Large soft tissue abscess involving the medial forefoot with gas formation. 2.  Osteomyelitis involving the first metatarsal and first proximal phalanx. 3.  Diffuse cellulitis and myofasciitis.  Original Report Authenticated By: P. Loralie Champagne, M.D.   Dg Foot 2 Views Left  10/15/2011  *RADIOLOGY REPORT*  Clinical Data: Penetrating wounds to the ball of the left foot, with worsening erythema and blistering.  LEFT FOOT - 2 VIEW  Comparison: None.  Findings: There is no evidence of fracture or dislocation.  No osseous erosions  are seen to suggest osteomyelitis.  The joint spaces are preserved.  There is no evidence of talar subluxation; the subtalar joint is unremarkable in appearance.  Soft tissue swelling and defect are noted along the plantar aspect of the forefoot.  IMPRESSION:  1.  No evidence of fracture or dislocation.  No definite evidence for osteomyelitis, though if there is significant clinical concern for osteomyelitis, MRI could be considered for further evaluation. 2.  Soft tissue defect and swelling along the plantar aspect of the forefoot.  Original Report Authenticated By: Tonia Ghent, M.D.   Dg Foot 2 Views Right  10/15/2011  *RADIOLOGY REPORT*  Clinical Data: Penetrating wounds to the ball of the right foot from blisters, with diffuse erythema and pain.  RIGHT FOOT - 2 VIEW  Comparison: None.  Findings: There is no evidence of fracture or dislocation.  No osseous erosions are seen to suggest osteomyelitis.  The joint spaces are preserved.  There is no evidence of talar subluxation; the subtalar joint is unremarkable in appearance.  Significant diffuse soft tissue air is noted tracking about the first toe and dorsal to the forefoot, concerning for necrotizing fasciitis.  Underlying soft tissue defects are noted along the plantar aspect  of the forefoot.  IMPRESSION:  1.  No evidence of fracture or dislocation.  No definite evidence for osteomyelitis, though if there is significant clinical concern for osteomyelitis, MRI could be considered for further evaluation. 2.  Significant diffuse soft tissue air about the first toe and dorsum of the forefoot, concerning for necrotizing fasciitis.  Findings were discussed with Earley Favor at 01:03 a.m. on 10/14/2011.  Original Report Authenticated By: Tonia Ghent, M.D.   Medications: Scheduled Meds:    . ciprofloxacin  400 mg Intravenous Q8H  . docusate sodium  100 mg Oral BID  . feeding supplement  237 mL Oral BID BM  . insulin aspart  0-15 Units Subcutaneous TID WC   . insulin aspart  0-5 Units Subcutaneous QHS  . insulin glargine  30 Units Subcutaneous Q0700  . l-methylfolate-B6-B12  1 tablet Oral BID  . metoprolol tartrate  50 mg Oral BID  . nitroGLYCERIN  0.6 mg Transdermal Daily  . pantoprazole  40 mg Oral Q1200  . rosuvastatin  20 mg Oral q1800  . senna  1 tablet Oral BID  . silver sulfADIAZINE  1 application Topical Daily  . tigecycline (TYGACIL) IVPB  100 mg Intravenous Once   Followed by  . tigecycline (TYGACIL) IVPB  50 mg Intravenous Q12H  . DISCONTD: aspirin  325 mg Oral Daily  . DISCONTD: metroNIDAZOLE  500 mg Oral Q8H  . DISCONTD: vancomycin  1,000 mg Intravenous Q12H   Continuous Infusions:    . DISCONTD: sodium chloride 75 mL/hr at 10/26/11 2000   PRN Meds:.acetaminophen, acetaminophen, HYDROmorphone, LORazepam, ondansetron (ZOFRAN) IV, ondansetron, oxyCODONE, polyethylene glycol  Wound culture from 10/14/2011  Specimen Description  WOUND RIGHT POSTERIOR FOOT   Special Requests  NONE   Gram Stain  NO WBC SEEN NO SQUAMOUS EPITHELIAL CELLS SEEN RARE GRAM NEGATIVE RODS   Culture  ABUNDANT PSEUDOMONAS AERUGINOSA   Report Status  PENDING   Organism ID, Bacteria  PSEUDOMONAS AERUGINOSA   Resulting Agency  [facility]    Culture & Susceptibility     PSEUDOMONAS AERUGINOSA          Antibiotic  Sensitivity  Microscan  Status      CEFEPIME  Sensitive  2  Final      Method:  MIC      CEFTAZIDIME  Sensitive  <=1  Final      Method:  MIC      CIPROFLOXACIN  Sensitive  <=0.25  Final      Method:  MIC      GENTAMICIN  Sensitive  2  Final      Method:  MIC      IMIPENEM  Sensitive  <=1  Final      Method:  MIC      TOBRAMYCIN  Sensitive  <=1  Final      Method:  MIC       Comments  PSEUDOMONAS AERUGINOSA (MIC)        ABUNDANT PSEUDOMONAS AERUGINOSA    Interval history:  Patient is a 55 year old male with a history of CAD, diabetes hypertension, presented with bilateral foot ulcers and right soft tissue infection due to  diabetes also on 10/15/2011. Orthopedics was consulted and patient was seen by Dr. Dorene Grebe. Patient underwent a right foot excisional debridement and ray amputation #1 and #2 on 10/17/2011. Per cultures showed Pseudomonas aeruginosa, abscess cultures on 10/17/2011 also showed Klebsiella oxytoca -non ESBL and enterococcus During admission patient also went into atrial flutter, was found to have  elevated troponins, was treated for NSTEMI/ demand ischemia with IV heparin and IV Cardizem. Rate has been well controlled now on on by mouth Cardizem and metoprolol. He remained on IV heparin until today when it was discontinued due to bleeding from the right foot wound. Per cardiology recommendations (Dr. Rennis Golden), patient can be started on xarelto for a month and DCCV as out-patient  Assessment/Plan: - Recurrent SIRS =- transferred  to SDU on 10/25/11 and recultured - improving   - Severe thrombocytopenia - ? Cause . Ruled out DIC. ? Due to beta lactam abx. Transfuse platelets if bleeding.  No empiric platelets until we establish this is not HIT. 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 Wound cultures growing pseudomonas,  abscess culture on 10/17/2011 shows Klebsiella oxytoca and enterococcus - Dr. Dorene Grebe and Dr. Aldean Baker following   CAD (coronary artery disease), autologous vein bypass graft / s/p 2 stent placement: went into atrial flutter on admission was found to have elevated troponins, was  treated for NSTEMI/ Demand ischemia with IV heparin and IV Cardizem.now he is in NSR.  - Continue with  metoprolol/ crestor.  - no aspirin due to severe thrombocytopenia   Atrial flutter with NSTEMI/demand ischemia: Followed by cardiology - Continue metoprolol - DC heparin drip due to low plateleta and bleeding on 10/24/11 Will start the xarelto after the  surgery and if platelets recover for primary stroke prophylaxis. - DCCV outpatient per Dr. Blanchie Dessert recommendations  Hypertension controlled  Diabetes Mellitus:  No further hypoglycemia episodes -  Continue insulin at current regimen, Continue carb modified diet  DVT prophylaxis: nothing for now due to severe thrombocytopenia    Zanaya Baize 10/27/2011, 3:55 PM [id number]

## 2011-10-27 NOTE — Progress Notes (Signed)
IP PROGRESS NOTE  Subjective:   Events noted. Patient feeling better this am.  Objective:  Vital signs in last 24 hours: Temp:  [97.3 F (36.3 C)-102.9 F (39.4 C)] 98.2 F (36.8 C) (12/14 1200) Pulse Rate:  [61-81] 64  (12/14 1200) Resp:  [16-35] 17  (12/14 1200) BP: (94-129)/(44-59) 115/52 mmHg (12/14 1200) SpO2:  [93 %-100 %] 98 % (12/14 1200) Weight:  [187 lb 2.7 oz (84.9 kg)] 187 lb 2.7 oz (84.9 kg) (12/14 0455) Weight change: 2 lb 6.8 oz (1.1 kg) Last BM Date: 10/27/11  Intake/Output from previous day: 12/13 0701 - 12/14 0700 In: 3697.5 [P.O.:960; I.V.:1837.5; IV Piggyback:900] Out: 1425 [Urine:1425]  Mouth: mucous membranes moist, pharynx normal without lesions Resp: clear to auscultation bilaterally Cardio: regular rate and rhythm, S1, S2 normal, no murmur, click, rub or gallop GI: soft, non-tender; bowel sounds normal; no masses,  no organomegaly Extremities: Erythema noted. Warm to touch.     Lab Results:  [facility] 10/27/11 0527 10/26/11 0635  WBC 3.3* 3.9*  HGB 7.5* 8.1*  HCT 23.7* 25.5*  PLT 26* 14*    BMET  Basename 10/27/11 0527 10/26/11 0635  NA 134* 134*  K 3.5 3.6  CL 98 99  CO2 28 29  GLUCOSE 194* 184*  BUN 21 19  CREATININE 1.25 1.26  CALCIUM 7.1* 7.4*     Medications: I have reviewed the patient's current medications.  Impression/Recs:  1. Thrombocytopenia: Likely medication related (zosyn vs. Heparin) counts maybe improving. Continue to mintor for now. No transfusion unless active bleeding noted.  Agree with stopping ASA.  No anticoagulation at this time unless HIT panel is positive. Then I would use argatroban or lepirudin  2. Ischemic wound to the right foot: I doubt we are dealing with arterial thrombosis due to HIT, but if indeed  HIT panel is positive I would anticoagulate as mentioned above.  3. Anemia: Likely due to Fe loss.  Would replace as needed.      LOS: 13 days   Emanuela Runnion 10/27/2011, 1:29 PM

## 2011-10-27 NOTE — Progress Notes (Signed)
   CARE MANAGEMENT NOTE 10/27/2011  Patient:  Brostrom,Marquette   Account Number:  [id number]  Date Initiated:  10/16/2011  Documentation initiated by:  MAYO,HENRIETTA  Subjective/Objective Assessment:   55 yr-old male adm with (B) foot ulcers, hyperglycemia, AFlutter w/RVR; lives with family.     Action/Plan:   Anticipated DC Date:  10/30/2011   Anticipated DC Plan:  HOME W HOME HEALTH SERVICES  In-house referral  Financial Counselor      DC Planning Services  CM consult  [facility]  Follow-up appt scheduled      Choice offered to / List presented to:             Status of service:   Medicare Important Message given?   (If response is "NO", the following Medicare IM given date fields will be blank) Date Medicare IM given:   Date Additional Medicare IM given:    Discharge Disposition:    Per UR Regulation:  Reviewed for med. necessity/level of care/duration of stay  Comments:  Contact:  Oliver Barre, niece  [phone] 12/14 pt [facility]eable [facility], appt on 11/13/11 at 9am w dr Gretel Acre yu at [address], [place]. debbie Zacharias Ridling rn,bsn 12/10 has hhc agency list for [place], community prescription care and insulin pt assist for to pt also. 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  Has no PCP, provided information/application packet for [facility] [employer].

## 2011-10-28 LAB — CBC
HCT: 23.4 % — ABNORMAL LOW (ref 39.0–52.0)
MCHC: 32.1 g/dL (ref 30.0–36.0)
MCV: 82.4 fL (ref 78.0–100.0)
RDW: 15.1 % (ref 11.5–15.5)

## 2011-10-28 LAB — GLUCOSE, CAPILLARY: Glucose-Capillary: 125 mg/dL — ABNORMAL HIGH (ref 70–99)

## 2011-10-28 LAB — BASIC METABOLIC PANEL
BUN: 20 mg/dL (ref 6–23)
Creatinine, Ser: 1.12 mg/dL (ref 0.50–1.35)
GFR calc Af Amer: 82 mL/min — ABNORMAL LOW (ref >90)
GFR calc non Af Amer: 71 mL/min — ABNORMAL LOW (ref >90)

## 2011-10-28 NOTE — Progress Notes (Signed)
Subjective: More interactive today. No new complains  Objective:  Weight change: 0.4 kg (14.1 oz)  Intake/Output Summary (Last 24 hours) at 10/28/11 1135 Last data filed at 10/28/11 0900  Gross per 24 hour  Intake   1280 ml  Output   1926 ml  Net   -646 ml  T max 102 Blood pressure 135/69, pulse 63, temperature 98.2 F (36.8 C), temperature source Oral, resp. rate 24, height 5\' 7"  (1.702 m), weight 85.3 kg (188 lb 0.8 oz), SpO2 99.00%.  On Exam alert  Heart:S1S2 RRR, no murmurs Lungs:CTAB, no wheezing or rhonchi. ABD: soft non tender , bowel sounds present. Extremities. Right foot dressing intact  Skin with purpura on the right arm   Lab Results: Results for orders placed during the hospital encounter of 10/14/11 (from the past 24 hour(s))  GLUCOSE, CAPILLARY     Status: Abnormal   Collection Time   10/27/11 12:41 PM      Component Value Range   Glucose-Capillary 127 (*) 70 - 99 (mg/dL)  GLUCOSE, CAPILLARY     Status: Abnormal   Collection Time   10/27/11  5:09 PM      Component Value Range   Glucose-Capillary 135 (*) 70 - 99 (mg/dL)  GLUCOSE, CAPILLARY     Status: Abnormal   Collection Time   10/27/11  9:27 PM      Component Value Range   Glucose-Capillary 135 (*) 70 - 99 (mg/dL)  CBC     Status: Abnormal   Collection Time   10/28/11  5:00 AM      Component Value Range   WBC 4.1  4.0 - 10.5 (K/uL)   RBC 2.84 (*) 4.22 - 5.81 (MIL/uL)   Hemoglobin 7.5 (*) 13.0 - 17.0 (g/dL)   HCT 16.1 (*) 09.6 - 52.0 (%)   MCV 82.4  78.0 - 100.0 (fL)   MCH 26.4  26.0 - 34.0 (pg)   MCHC 32.1  30.0 - 36.0 (g/dL)   RDW 04.5  40.9 - 81.1 (%)   Platelets 36 (*) 150 - 400 (K/uL)  BASIC METABOLIC PANEL     Status: Abnormal   Collection Time   10/28/11  5:00 AM      Component Value Range   Sodium 135  135 - 145 (mEq/L)   Potassium 3.8  3.5 - 5.1 (mEq/L)   Chloride 100  96 - 112 (mEq/L)   CO2 29  19 - 32 (mEq/L)   Glucose, Bld 126 (*) 70 - 99 (mg/dL)   BUN 20  6 - 23 (mg/dL)   Creatinine, Ser 9.14  0.50 - 1.35 (mg/dL)   Calcium 7.4 (*) 8.4 - 10.5 (mg/dL)   GFR calc non Af Amer 71 (*) >90 (mL/min)   GFR calc Af Amer 82 (*) >90 (mL/min)  GLUCOSE, CAPILLARY     Status: Normal   Collection Time   10/28/11  7:32 AM      Component Value Range   Glucose-Capillary 96  70 - 99 (mg/dL)     Micro Results: Recent Results (from the past 240 hour(s))  CULTURE, BLOOD (ROUTINE X 2)     Status: Normal (Preliminary result)   Collection Time   10/25/11  6:20 PM      Component Value Range Status Comment   Specimen Description BLOOD RIGHT ARM   Final    Special Requests BOTTLES DRAWN AEROBIC AND ANAEROBIC 10CC   Final    Setup Time [id number]   Final    Culture  Final    Value:        BLOOD CULTURE RECEIVED NO GROWTH TO DATE CULTURE WILL BE HELD FOR 5 DAYS BEFORE ISSUING A FINAL NEGATIVE REPORT   Report Status PENDING   Incomplete   CULTURE, BLOOD (ROUTINE X 2)     Status: Normal (Preliminary result)   Collection Time   10/25/11  6:35 PM      Component Value Range Status Comment   Specimen Description BLOOD LEFT HAND   Final    Special Requests BOTTLES DRAWN AEROBIC AND ANAEROBIC 10CC   Final    Setup Time [id number]   Final    Culture     Final    Value:        BLOOD CULTURE RECEIVED NO GROWTH TO DATE CULTURE WILL BE HELD FOR 5 DAYS BEFORE ISSUING A FINAL NEGATIVE REPORT   Report Status PENDING   Incomplete     Studies/Results: Ct Angio Chest W/cm &/or Wo Cm  10/15/2011  *RADIOLOGY REPORT*  Clinical Data:  Chest pain with shortness of breath and elevated D- dimer levels.  Diabetes.  Question pulmonary embolism.  CT ANGIOGRAPHY CHEST WITH CONTRAST  Technique:  Multidetector CT imaging of the chest was performed using the standard protocol during bolus administration of intravenous contrast.  Multiplanar CT image reconstructions including MIPs were obtained to evaluate the vascular anatomy.  Contrast: OMNIPAQUE IOHEXOL 300 MG/ML IV SOLN  Comparison:  None.   Findings:  The pulmonary arteries are well opacified with contrast. There is no evidence of acute pulmonary embolism.  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  The heart is mildly enlarged.  No enlarged mediastinal or hilar lymph nodes are demonstrated.  A pretracheal node demonstrates a fatty hilum.  There is no pleural or pericardial effusion.  The lungs are clear.  Images through the upper abdomen demonstrate contour irregularity of the liver suspicious for cirrhosis.  The spleen appears mildly enlarged.  There are multiple small calcified gallstones.  Review of the MIP images confirms the above findings.  IMPRESSION:  1.  No evidence of acute pulmonary embolism or other acute chest process. 2.  Cholelithiasis. 3.  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  Correlate clinically. 4.  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  Original Report Authenticated By: Gerrianne Scale, M.D.   Mr Foot Right W Wo Contrast  10/15/2011  *RADIOLOGY REPORT*  Clinical Data: Infected diabetic foot ulcer.  MRI OF THE RIGHT FOREFOOT WITHOUT AND WITH CONTRAST  Technique:  Multiplanar, multisequence MR imaging was performed both before and after administration of intravenous contrast.  Contrast: 20mL MULTIHANCE GADOBENATE DIMEGLUMINE 529 MG/ML IV SOLN  Comparison: Right foot radiographs.  Findings: There is a large soft tissue abscess involving the medial aspect of the forefoot it is largely between the first and second toe is extending along the dorsum of the first metatarsal.  As noted on the plain films there is extensive gas formation in the soft tissues.  No obvious changes of septic arthritis but there are findings for osteomyelitis involving the first metatarsal and first proximal phalanx.  There is associated diffuse cellulitis and myofasciitis.  No involvement of the forefoot or visualized hind foot bony structures.  IMPRESSION:  1.  Large soft tissue abscess involving the medial forefoot with gas  formation. 2.  Osteomyelitis involving the first metatarsal and first proximal phalanx. 3.  Diffuse cellulitis and myofasciitis.  Original Report Authenticated By: P. Loralie Champagne, M.D.   Dg Foot 2 Views Left  10/15/2011  *RADIOLOGY REPORT*  Clinical Data: Penetrating wounds to the ball of the left foot, with worsening erythema and blistering.  LEFT FOOT - 2 VIEW  Comparison: None.  Findings: There is no evidence of fracture or dislocation.  No osseous erosions are seen to suggest osteomyelitis.  The joint spaces are preserved.  There is no evidence of talar subluxation; the subtalar joint is unremarkable in appearance.  Soft tissue swelling and defect are noted along the plantar aspect of the forefoot.  IMPRESSION:  1.  No evidence of fracture or dislocation.  No definite evidence for osteomyelitis, though if there is significant clinical concern for osteomyelitis, MRI could be considered for further evaluation. 2.  Soft tissue defect and swelling along the plantar aspect of the forefoot.  Original Report Authenticated By: Tonia Ghent, M.D.   Dg Foot 2 Views Right  10/15/2011  *RADIOLOGY REPORT*  Clinical Data: Penetrating wounds to the ball of the right foot from blisters, with diffuse erythema and pain.  RIGHT FOOT - 2 VIEW  Comparison: None.  Findings: There is no evidence of fracture or dislocation.  No osseous erosions are seen to suggest osteomyelitis.  The joint spaces are preserved.  There is no evidence of talar subluxation; the subtalar joint is unremarkable in appearance.  Significant diffuse soft tissue air is noted tracking about the first toe and dorsal to the forefoot, concerning for necrotizing fasciitis.  Underlying soft tissue defects are noted along the plantar aspect of the forefoot.  IMPRESSION:  1.  No evidence of fracture or dislocation.  No definite evidence for osteomyelitis, though if there is significant clinical concern for osteomyelitis, MRI could be considered for further  evaluation. 2.  Significant diffuse soft tissue air about the first toe and dorsum of the forefoot, concerning for necrotizing fasciitis.  Findings were discussed with Earley Favor at 01:03 a.m. on 10/14/2011.  Original Report Authenticated By: Tonia Ghent, M.D.   Medications: Scheduled Meds:    . ciprofloxacin  400 mg Intravenous Q8H  . docusate sodium  100 mg Oral BID  . feeding supplement  237 mL Oral BID BM  . insulin aspart  0-15 Units Subcutaneous TID WC  . insulin aspart  0-5 Units Subcutaneous QHS  . insulin glargine  30 Units Subcutaneous Q0700  . l-methylfolate-B6-B12  1 tablet Oral BID  . metoprolol tartrate  50 mg Oral BID  . nitroGLYCERIN  0.6 mg Transdermal Daily  . pantoprazole  40 mg Oral Q1200  . rosuvastatin  20 mg Oral q1800  . senna  1 tablet Oral BID  . silver sulfADIAZINE  1 application Topical Daily  . tigecycline (TYGACIL) IVPB  50 mg Intravenous Q12H   Continuous Infusions:    . DISCONTD: sodium chloride 75 mL/hr at 10/26/11 2000   PRN Meds:.acetaminophen, acetaminophen, HYDROmorphone, LORazepam, ondansetron (ZOFRAN) IV, ondansetron, oxyCODONE, polyethylene glycol  Wound culture from 10/14/2011  Specimen Description  WOUND RIGHT POSTERIOR FOOT   Special Requests  NONE   Gram Stain  NO WBC SEEN NO SQUAMOUS EPITHELIAL CELLS SEEN RARE GRAM NEGATIVE RODS   Culture  ABUNDANT PSEUDOMONAS AERUGINOSA   Report Status  PENDING   Organism ID, Bacteria  PSEUDOMONAS AERUGINOSA   Resulting Agency  [facility]    Culture & Susceptibility     PSEUDOMONAS AERUGINOSA          Antibiotic  Sensitivity  Microscan  Status      CEFEPIME  Sensitive  2  Final      Method:  MIC      CEFTAZIDIME  Sensitive  <=1  Final      Method:  MIC      CIPROFLOXACIN  Sensitive  <=0.25  Final      Method:  MIC      GENTAMICIN  Sensitive  2  Final      Method:  MIC      IMIPENEM  Sensitive  <=1  Final      Method:  MIC      TOBRAMYCIN  Sensitive  <=1  Final      Method:  MIC        Comments  PSEUDOMONAS AERUGINOSA (MIC)        ABUNDANT PSEUDOMONAS AERUGINOSA    Interval history:  Patient is a 55 year old male with a history of CAD, diabetes hypertension, presented with bilateral foot ulcers and right soft tissue infection due to diabetes also on 10/15/2011. Orthopedics was consulted and patient was seen by Dr. Dorene Grebe. Patient underwent a right foot excisional debridement and ray amputation #1 and #2 on 10/17/2011. Per cultures showed Pseudomonas aeruginosa, abscess cultures on 10/17/2011 also showed Klebsiella oxytoca -non ESBL and enterococcus During admission patient also went into atrial flutter, was found to have elevated troponins, was treated for NSTEMI/ demand ischemia with IV heparin and IV Cardizem. Rate has been well controlled now on on by mouth Cardizem and metoprolol. He remained on IV heparin until today when it was discontinued due to bleeding from the right foot wound. Per cardiology recommendations (Dr. Rennis Golden), patient can be started on xarelto for a month and [facility] as out-patient  Assessment/Plan: - Recurrent SIRS =- transferred  to SDU on 10/25/11 and recultured - improving   - Severe thrombocytopenia - unclear cause, improving now. Ruled out DIC. ? Due to beta lactam abx. Transfuse platelets if bleeding.  No empiric platelets until we establish this is not HIT. 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 Wound cultures growing pseudomonas,  abscess culture on 10/17/2011 shows Klebsiella oxytoca and enterococcus - Dr. Dorene Grebe and Dr. Aldean Baker following   CAD (coronary artery disease), autologous vein bypass graft / s/p 2 stent placement: went into atrial flutter on admission was found to have elevated troponins, was  treated for NSTEMI/ Demand ischemia with IV heparin and IV Cardizem.now he is in NSR.  -  Continue with  metoprolol/ crestor.  - no aspirin due to severe thrombocytopenia   Atrial flutter with NSTEMI/demand ischemia: Followed by cardiology - Continue metoprolol - DC heparin drip due to low plateleta and bleeding on 10/24/11 Will start the xarelto after the surgery and if platelets recover for primary stroke prophylaxis. - DCCV outpatient per Dr. Blanchie Dessert recommendations  Hypertension controlled  Diabetes Mellitus:  No further hypoglycemia episodes -  Continue insulin at current regimen, Continue carb modified diet  DVT prophylaxis: nothing for now due to severe thrombocytopenia    Jancy Sprankle 10/28/2011, 11:35 AM [id number]

## 2011-10-28 NOTE — Progress Notes (Signed)
Pt education completed, iv and monitor d/c'd wife given Rx, instructions, follow up appointments, home health shower chair and information about home health to come to home. No questions or concerns at this time.

## 2011-10-29 LAB — CBC
HCT: 25.7 % — ABNORMAL LOW (ref 39.0–52.0)
MCV: 81.8 fL (ref 78.0–100.0)
Platelets: 57 10*3/uL — ABNORMAL LOW (ref 150–400)
RBC: 3.14 MIL/uL — ABNORMAL LOW (ref 4.22–5.81)
WBC: 4 10*3/uL (ref 4.0–10.5)

## 2011-10-29 LAB — GLUCOSE, CAPILLARY
Glucose-Capillary: 128 mg/dL — ABNORMAL HIGH (ref 70–99)
Glucose-Capillary: 141 mg/dL — ABNORMAL HIGH (ref 70–99)

## 2011-10-29 LAB — HEPARIN INDUCED THROMBOCYTOPENIA PNL
UFH Low Dose 0.1 IU/mL: 0 % Release
UFH SRA Result: NEGATIVE

## 2011-10-29 LAB — BASIC METABOLIC PANEL
BUN: 22 mg/dL (ref 6–23)
CO2: 28 mEq/L (ref 19–32)
Chloride: 101 mEq/L (ref 96–112)
Creatinine, Ser: 1.09 mg/dL (ref 0.50–1.35)

## 2011-10-29 NOTE — Progress Notes (Signed)
Subjective: No new complaints  Objective:  Weight change:   Intake/Output Summary (Last 24 hours) at 10/29/11 1349 Last data filed at 10/29/11 0700  Gross per 24 hour  Intake    760 ml  Output   2125 ml  Net  -1365 ml  T max 102 Blood pressure 127/56, pulse 60, temperature 98 F (36.7 C), temperature source Oral, resp. rate 20, height 5\' 7"  (1.702 m), weight 85.3 kg (188 lb 0.8 oz), SpO2 100.00%.  On Exam alert oriented x3 not in any acute distress Heart:S1S2 RRR, no murmurs Lungs:CTAB, no wheezing or rhonchi. ABD: soft non tender , bowel sounds present. Extremities. Right foot dressing intact    Lab Results: Results for orders placed during the hospital encounter of 10/14/11 (from the past 24 hour(s))  GLUCOSE, CAPILLARY     Status: Abnormal   Collection Time   10/28/11  5:20 PM      Component Value Range   Glucose-Capillary 125 (*) 70 - 99 (mg/dL)  GLUCOSE, CAPILLARY     Status: Abnormal   Collection Time   10/28/11  9:21 PM      Component Value Range   Glucose-Capillary 122 (*) 70 - 99 (mg/dL)  CBC     Status: Abnormal   Collection Time   10/29/11  5:30 AM      Component Value Range   WBC 4.0  4.0 - 10.5 (K/uL)   RBC 3.14 (*) 4.22 - 5.81 (MIL/uL)   Hemoglobin 8.2 (*) 13.0 - 17.0 (g/dL)   HCT 40.9 (*) 81.1 - 52.0 (%)   MCV 81.8  78.0 - 100.0 (fL)   MCH 26.1  26.0 - 34.0 (pg)   MCHC 31.9  30.0 - 36.0 (g/dL)   RDW 91.4  78.2 - 95.6 (%)   Platelets 57 (*) 150 - 400 (K/uL)  BASIC METABOLIC PANEL     Status: Abnormal   Collection Time   10/29/11  5:30 AM      Component Value Range   Sodium 137  135 - 145 (mEq/L)   Potassium 3.8  3.5 - 5.1 (mEq/L)   Chloride 101  96 - 112 (mEq/L)   CO2 28  19 - 32 (mEq/L)   Glucose, Bld 108 (*) 70 - 99 (mg/dL)   BUN 22  6 - 23 (mg/dL)   Creatinine, Ser 2.13  0.50 - 1.35 (mg/dL)   Calcium 8.1 (*) 8.4 - 10.5 (mg/dL)   GFR calc non Af Amer 74 (*) >90 (mL/min)   GFR calc Af Amer 85 (*) >90 (mL/min)  GLUCOSE, CAPILLARY      Status: Abnormal   Collection Time   10/29/11  8:20 AM      Component Value Range   Glucose-Capillary 128 (*) 70 - 99 (mg/dL)   Comment 1 Documented in Chart     Comment 2 Notify RN    GLUCOSE, CAPILLARY     Status: Abnormal   Collection Time   10/29/11 12:17 PM      Component Value Range   Glucose-Capillary 131 (*) 70 - 99 (mg/dL)   Comment 1 Documented in Chart     Comment 2 Notify RN       Micro Results: Recent Results (from the past 240 hour(s))  CULTURE, BLOOD (ROUTINE X 2)     Status: Normal (Preliminary result)   Collection Time   10/25/11  6:20 PM      Component Value Range Status Comment   Specimen Description BLOOD RIGHT ARM   Final  Special Requests BOTTLES DRAWN AEROBIC AND ANAEROBIC 10CC   Final    Setup Time [id number]   Final    Culture     Final    Value:        BLOOD CULTURE RECEIVED NO GROWTH TO DATE CULTURE WILL BE HELD FOR 5 DAYS BEFORE ISSUING A FINAL NEGATIVE REPORT   Report Status PENDING   Incomplete   CULTURE, BLOOD (ROUTINE X 2)     Status: Normal (Preliminary result)   Collection Time   10/25/11  6:35 PM      Component Value Range Status Comment   Specimen Description BLOOD LEFT HAND   Final    Special Requests BOTTLES DRAWN AEROBIC AND ANAEROBIC 10CC   Final    Setup Time [id number]   Final    Culture     Final    Value:        BLOOD CULTURE RECEIVED NO GROWTH TO DATE CULTURE WILL BE HELD FOR 5 DAYS BEFORE ISSUING A FINAL NEGATIVE REPORT   Report Status PENDING   Incomplete     Studies/Results: Ct Angio Chest W/cm &/or Wo Cm  10/15/2011  *RADIOLOGY REPORT*  Clinical Data:  Chest pain with shortness of breath and elevated D- dimer levels.  Diabetes.  Question pulmonary embolism.  CT ANGIOGRAPHY CHEST WITH CONTRAST  Technique:  Multidetector CT imaging of the chest was performed using the standard protocol during bolus administration of intravenous contrast.  Multiplanar CT image reconstructions including MIPs were obtained to evaluate the  vascular anatomy.  Contrast: OMNIPAQUE IOHEXOL 300 MG/ML IV SOLN  Comparison:  None.  Findings:  The pulmonary arteries are well opacified with contrast. There is no evidence of acute pulmonary embolism.  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  The heart is mildly enlarged.  No enlarged mediastinal or hilar lymph nodes are demonstrated.  A pretracheal node demonstrates a fatty hilum.  There is no pleural or pericardial effusion.  The lungs are clear.  Images through the upper abdomen demonstrate contour irregularity of the liver suspicious for cirrhosis.  The spleen appears mildly enlarged.  There are multiple small calcified gallstones.  Review of the MIP images confirms the above findings.  IMPRESSION:  1.  No evidence of acute pulmonary embolism or other acute chest process. 2.  Cholelithiasis. 3.  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  Correlate clinically. 4.  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  Original Report Authenticated By: Gerrianne Scale, M.D.   Mr Foot Right W Wo Contrast  10/15/2011  *RADIOLOGY REPORT*  Clinical Data: Infected diabetic foot ulcer.  MRI OF THE RIGHT FOREFOOT WITHOUT AND WITH CONTRAST  Technique:  Multiplanar, multisequence MR imaging was performed both before and after administration of intravenous contrast.  Contrast: 20mL MULTIHANCE GADOBENATE DIMEGLUMINE 529 MG/ML IV SOLN  Comparison: Right foot radiographs.  Findings: There is a large soft tissue abscess involving the medial aspect of the forefoot it is largely between the first and second toe is extending along the dorsum of the first metatarsal.  As noted on the plain films there is extensive gas formation in the soft tissues.  No obvious changes of septic arthritis but there are findings for osteomyelitis involving the first metatarsal and first proximal phalanx.  There is associated diffuse cellulitis and myofasciitis.  No involvement of the forefoot or visualized hind foot bony  structures.  IMPRESSION:  1.  Large soft tissue abscess involving the medial forefoot with gas formation. 2.  Osteomyelitis involving the  first metatarsal and first proximal phalanx. 3.  Diffuse cellulitis and myofasciitis.  Original Report Authenticated By: P. Loralie Champagne, M.D.   Dg Foot 2 Views Left  10/15/2011  *RADIOLOGY REPORT*  Clinical Data: Penetrating wounds to the ball of the left foot, with worsening erythema and blistering.  LEFT FOOT - 2 VIEW  Comparison: None.  Findings: There is no evidence of fracture or dislocation.  No osseous erosions are seen to suggest osteomyelitis.  The joint spaces are preserved.  There is no evidence of talar subluxation; the subtalar joint is unremarkable in appearance.  Soft tissue swelling and defect are noted along the plantar aspect of the forefoot.  IMPRESSION:  1.  No evidence of fracture or dislocation.  No definite evidence for osteomyelitis, though if there is significant clinical concern for osteomyelitis, MRI could be considered for further evaluation. 2.  Soft tissue defect and swelling along the plantar aspect of the forefoot.  Original Report Authenticated By: Tonia Ghent, M.D.   Dg Foot 2 Views Right  10/15/2011  *RADIOLOGY REPORT*  Clinical Data: Penetrating wounds to the ball of the right foot from blisters, with diffuse erythema and pain.  RIGHT FOOT - 2 VIEW  Comparison: None.  Findings: There is no evidence of fracture or dislocation.  No osseous erosions are seen to suggest osteomyelitis.  The joint spaces are preserved.  There is no evidence of talar subluxation; the subtalar joint is unremarkable in appearance.  Significant diffuse soft tissue air is noted tracking about the first toe and dorsal to the forefoot, concerning for necrotizing fasciitis.  Underlying soft tissue defects are noted along the plantar aspect of the forefoot.  IMPRESSION:  1.  No evidence of fracture or dislocation.  No definite evidence for osteomyelitis, though if  there is significant clinical concern for osteomyelitis, MRI could be considered for further evaluation. 2.  Significant diffuse soft tissue air about the first toe and dorsum of the forefoot, concerning for necrotizing fasciitis.  Findings were discussed with Earley Favor at 01:03 a.m. on 10/14/2011.  Original Report Authenticated By: Tonia Ghent, M.D.   Medications: Scheduled Meds:    . ciprofloxacin  400 mg Intravenous Q8H  . docusate sodium  100 mg Oral BID  . feeding supplement  237 mL Oral BID BM  . insulin aspart  0-15 Units Subcutaneous TID WC  . insulin aspart  0-5 Units Subcutaneous QHS  . insulin glargine  30 Units Subcutaneous Q0700  . l-methylfolate-B6-B12  1 tablet Oral BID  . metoprolol tartrate  50 mg Oral BID  . nitroGLYCERIN  0.6 mg Transdermal Daily  . pantoprazole  40 mg Oral Q1200  . rosuvastatin  20 mg Oral q1800  . senna  1 tablet Oral BID  . silver sulfADIAZINE  1 application Topical Daily  . tigecycline (TYGACIL) IVPB  50 mg Intravenous Q12H   Continuous Infusions:   PRN Meds:.acetaminophen, acetaminophen, HYDROmorphone, LORazepam, ondansetron (ZOFRAN) IV, ondansetron, oxyCODONE, polyethylene glycol  Wound culture from 10/14/2011  Specimen Description  WOUND RIGHT POSTERIOR FOOT   Special Requests  NONE   Gram Stain  NO WBC SEEN NO SQUAMOUS EPITHELIAL CELLS SEEN RARE GRAM NEGATIVE RODS   Culture  ABUNDANT PSEUDOMONAS AERUGINOSA   Report Status  PENDING   Organism ID, Bacteria  PSEUDOMONAS AERUGINOSA   Resulting Agency  [facility]    Culture & Susceptibility     PSEUDOMONAS AERUGINOSA          Antibiotic  Sensitivity  Microscan  Status  CEFEPIME  Sensitive  2  Final      Method:  MIC      CEFTAZIDIME  Sensitive  <=1  Final      Method:  MIC      CIPROFLOXACIN  Sensitive  <=0.25  Final      Method:  MIC      GENTAMICIN  Sensitive  2  Final      Method:  MIC      IMIPENEM  Sensitive  <=1  Final      Method:  MIC      TOBRAMYCIN  Sensitive   <=1  Final      Method:  MIC       Comments  PSEUDOMONAS AERUGINOSA (MIC)        ABUNDANT PSEUDOMONAS AERUGINOSA    Interval history:  Patient is a 55 year old male with a history of CAD, diabetes hypertension, presented with bilateral foot ulcers and right soft tissue infection due to diabetes also on 10/15/2011. Orthopedics was consulted and patient was seen by Dr. Dorene Grebe. Patient underwent a right foot excisional debridement and ray amputation #1 and #2 on 10/17/2011. Per cultures showed Pseudomonas aeruginosa, abscess cultures on 10/17/2011 also showed Klebsiella oxytoca -non ESBL and enterococcus During admission patient also went into atrial flutter, was found to have elevated troponins, was treated for NSTEMI/ demand ischemia with IV heparin and IV Cardizem. Rate has been well controlled now on on by mouth Cardizem and metoprolol. He remained on IV heparin until today when it was discontinued due to bleeding from the right foot wound. Per cardiology recommendations (Dr. Rennis Golden), patient can be started on xarelto for a month and [facility] as out-patient  Assessment/Plan: - Recurrent SIRS - resolved   - Severe thrombocytopenia - unclear cause, platelets improving now. Ruled out DIC. ? Due to beta lactam abx. Transfuse platelets if bleeding.  No empiric platelets until we establish this is not HIT. 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 Wound cultures growing pseudomonas,  abscess culture on 10/17/2011 shows Klebsiella oxytoca and enterococcus - Dr. Dorene Grebe and Dr. Aldean Baker following   CAD (coronary artery disease), autologous vein bypass graft / s/p 2 stent placement: went into atrial flutter on admission was found to have elevated troponins, was  treated for NSTEMI/ Demand ischemia with IV heparin and IV Cardizem, now  in NSR.  - Continue  with metoprolol/ crestor.  - no aspirin due to severe thrombocytopenia   Atrial flutter with NSTEMI/demand ischemia: Followed by cardiology - Continue metoprolol - DC heparin drip due to low plateleta and bleeding on 10/24/11 Will start the xarelto after the surgery and if platelets recover for primary stroke prophylaxis. - DCCV outpatient per Dr. Blanchie Dessert recommendations  Hypertension controlled  Diabetes Mellitus:  No further hypoglycemia episodes -  Continue insulin at current regimen, Continue carb modified diet  DVT prophylaxis: nothing for now due to severe thrombocytopenia    RAI,RIPUDEEP 10/29/2011, 1:49 PM [id number]

## 2011-10-30 LAB — BASIC METABOLIC PANEL
BUN: 18 mg/dL (ref 6–23)
CO2: 31 mEq/L (ref 19–32)
Chloride: 101 mEq/L (ref 96–112)
Glucose, Bld: 172 mg/dL — ABNORMAL HIGH (ref 70–99)
Potassium: 3.6 mEq/L (ref 3.5–5.1)
Sodium: 137 mEq/L (ref 135–145)

## 2011-10-30 LAB — CBC
HCT: 25.7 % — ABNORMAL LOW (ref 39.0–52.0)
Hemoglobin: 8.1 g/dL — ABNORMAL LOW (ref 13.0–17.0)
RBC: 3.14 MIL/uL — ABNORMAL LOW (ref 4.22–5.81)
WBC: 4.7 10*3/uL (ref 4.0–10.5)

## 2011-10-30 LAB — GLUCOSE, CAPILLARY
Glucose-Capillary: 145 mg/dL — ABNORMAL HIGH (ref 70–99)
Glucose-Capillary: 122 mg/dL — ABNORMAL HIGH (ref 70–99)
Glucose-Capillary: 133 mg/dL — ABNORMAL HIGH (ref 70–99)

## 2011-10-30 NOTE — Progress Notes (Signed)
Subjective: Patient with less foot pain  Objective: Weight change:   Intake/Output Summary (Last 24 hours) at 10/30/11 1500 Last data filed at 10/30/11 1300  Gross per 24 hour  Intake   1160 ml  Output   2875 ml  Net  -1715 ml   Blood pressure 134/59, pulse 61, temperature 97.9 F (36.6 C), temperature source Oral, resp. rate 22, height 5\' 7"  (1.702 m), weight 188 lb 0.8 oz (85.3 kg), SpO2 98.00%. Temp:  [97.9 F (36.6 C)-98.3 F (36.8 C)] 97.9 F (36.6 C) (12/17 1200) Pulse Rate:  [53-63] 61  (12/17 1200) Resp:  [17-48] 22  (12/17 1200) BP: (128-147)/(54-67) 134/59 mmHg (12/17 1200) SpO2:  [94 %-98 %] 98 % (12/17 1200)  Physical Exam: General: Alert and awake, oriented x3, not in any acute distress. HEENT: anicteric sclera, pupils reactive to light and accommodation, EOMI CVS regular rate, normal r,  no murmur rubs or gallops Chest: clear to auscultation bilaterally, no wheezing, rales or rhonchi Abdomen: soft nontender, nondistended, normal bowel sounds, Extremities: his left ankle with dressing Skin: echymoses  Lymph: no new lymphadenopathy Neuro: nonfocal  Lab Results:  Basename 10/30/11 0500 10/29/11 0530  WBC 4.7 4.0  HGB 8.1* 8.2*  HCT 25.7* 25.7*  PLT 82* 57*   BMET  Basename 10/30/11 0500 10/29/11 0530  NA 137 137  K 3.6 3.8  CL 101 101  CO2 31 28  GLUCOSE 172* 108*  BUN 18 22  CREATININE 1.02 1.09  CALCIUM 7.9* 8.1*    Micro Results: Recent Results (from the past 240 hour(s))  CULTURE, BLOOD (ROUTINE X 2)     Status: Normal (Preliminary result)   Collection Time   10/25/11  6:20 PM      Component Value Range Status Comment   Specimen Description BLOOD RIGHT ARM   Final    Special Requests BOTTLES DRAWN AEROBIC AND ANAEROBIC 10CC   Final    Setup Time [id number]   Final    Culture     Final    Value:        BLOOD CULTURE RECEIVED NO GROWTH TO DATE CULTURE WILL BE HELD FOR 5 DAYS BEFORE ISSUING A FINAL NEGATIVE REPORT   Report Status  PENDING   Incomplete   CULTURE, BLOOD (ROUTINE X 2)     Status: Normal (Preliminary result)   Collection Time   10/25/11  6:35 PM      Component Value Range Status Comment   Specimen Description BLOOD LEFT HAND   Final    Special Requests BOTTLES DRAWN AEROBIC AND ANAEROBIC 10CC   Final    Setup Time [id number]   Final    Culture     Final    Value:        BLOOD CULTURE RECEIVED NO GROWTH TO DATE CULTURE WILL BE HELD FOR 5 DAYS BEFORE ISSUING A FINAL NEGATIVE REPORT   Report Status PENDING   Incomplete     Studies/Results: Ct Angio Chest W/cm &/or Wo Cm  10/15/2011  *RADIOLOGY REPORT*  Clinical Data:  Chest pain with shortness of breath and elevated D- dimer levels.  Diabetes.  Question pulmonary embolism.  CT ANGIOGRAPHY CHEST WITH CONTRAST  Technique:  Multidetector CT imaging of the chest was performed using the standard protocol during bolus administration of intravenous contrast.  Multiplanar CT image reconstructions including MIPs were obtained to evaluate the vascular anatomy.  Contrast: OMNIPAQUE IOHEXOL 300 MG/ML IV SOLN  Comparison:  None.  Findings:  The pulmonary arteries  are well opacified with contrast. There is no evidence of acute pulmonary embolism.  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  The heart is mildly enlarged.  No enlarged mediastinal or hilar lymph nodes are demonstrated.  A pretracheal node demonstrates a fatty hilum.  There is no pleural or pericardial effusion.  The lungs are clear.  Images through the upper abdomen demonstrate contour irregularity of the liver suspicious for cirrhosis.  The spleen appears mildly enlarged.  There are multiple small calcified gallstones.  Review of the MIP images confirms the above findings.  IMPRESSION:  1.  No evidence of acute pulmonary embolism or other acute chest process. 2.  Cholelithiasis. 3.  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  Correlate clinically. 4.  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   Original Report Authenticated By: Gerrianne Scale, M.D.   MRI:  Comparison: MRI 10/15/2011.  Findings: Since the prior examination, the patient has undergone  amputation at the level of the proximal diaphysis of the first and  second metatarsals.  Soft tissues demonstrate extensive edema and enhancement. There  appears be a soft tissue wound over the dorsum of the foot. There  is a rim enhancing fluid collection originating from the medial  margin of the residual base of the first metatarsal extending  distally to the end of the medial aspect of the foot. The  collection also extends over the dorsum of the foot at the level of  the mid metatarsals to the level of the fourth metatarsal. The  collection cannot be discretely measured but is approximately 4.7  cm long by up to 2.8 cm cranial-caudal by 2.5 cm transverse.  IMPRESSION:  1. Status post amputation at the level the bases of the first and  second metatarsals. No evidence of osteomyelitis is identified.  2. Persistent marked cellulitis with a rim enhancing fluid  collection compatible with abscess. As noted above, the collection  extends from approximately the medial aspect of the base of the  first metatarsal distally to the end of the medial aspect of the  foot. At the level of the mid metatarsals, the collection extends  over the dorsum of the foot laterally to the level of the fourth  metatarsal.   10/15/2011  *RADIOLOGY REPORT*  Clinical Data: Infected diabetic foot ulcer.  MRI OF THE RIGHT FOREFOOT WITHOUT AND WITH CONTRAST  Technique:  Multiplanar, multisequence MR imaging was performed both before and after administration of intravenous contrast.  Contrast: 20mL MULTIHANCE GADOBENATE DIMEGLUMINE 529 MG/ML IV SOLN  Comparison: Right foot radiographs.  Findings: There is a large soft tissue abscess involving the medial aspect of the forefoot it is largely between the first and second toe is extending along the dorsum of the  first metatarsal.  As noted on the plain films there is extensive gas formation in the soft tissues.  No obvious changes of septic arthritis but there are findings for osteomyelitis involving the first metatarsal and first proximal phalanx.  There is associated diffuse cellulitis and myofasciitis.  No involvement of the forefoot or visualized hind foot bony structures.  IMPRESSION:  1.  Large soft tissue abscess involving the medial forefoot with gas formation. 2.  Osteomyelitis involving the first metatarsal and first proximal phalanx. 3.  Diffuse cellulitis and myofasciitis.  Original Report Authenticated By: P. Loralie Champagne, M.D.   Dg Foot 2 Views Left  10/15/2011  *RADIOLOGY REPORT*  Clinical Data: Penetrating wounds to the ball of the left foot, with worsening erythema and blistering.  LEFT FOOT - 2 VIEW  Comparison: None.  Findings: There is no evidence of fracture or dislocation.  No osseous erosions are seen to suggest osteomyelitis.  The joint spaces are preserved.  There is no evidence of talar subluxation; the subtalar joint is unremarkable in appearance.  Soft tissue swelling and defect are noted along the plantar aspect of the forefoot.  IMPRESSION:  1.  No evidence of fracture or dislocation.  No definite evidence for osteomyelitis, though if there is significant clinical concern for osteomyelitis, MRI could be considered for further evaluation. 2.  Soft tissue defect and swelling along the plantar aspect of the forefoot.  Original Report Authenticated By: Tonia Ghent, M.D.   Dg Foot 2 Views Right  10/15/2011  *RADIOLOGY REPORT*  Clinical Data: Penetrating wounds to the ball of the right foot from blisters, with diffuse erythema and pain.  RIGHT FOOT - 2 VIEW  Comparison: None.  Findings: There is no evidence of fracture or dislocation.  No osseous erosions are seen to suggest osteomyelitis.  The joint spaces are preserved.  There is no evidence of talar subluxation; the subtalar joint is  unremarkable in appearance.  Significant diffuse soft tissue air is noted tracking about the first toe and dorsal to the forefoot, concerning for necrotizing fasciitis.  Underlying soft tissue defects are noted along the plantar aspect of the forefoot.  IMPRESSION:  1.  No evidence of fracture or dislocation.  No definite evidence for osteomyelitis, though if there is significant clinical concern for osteomyelitis, MRI could be considered for further evaluation. 2.  Significant diffuse soft tissue air about the first toe and dorsum of the forefoot, concerning for necrotizing fasciitis.  Findings were discussed with Earley Favor at 01:03 a.m. on 10/14/2011.  Original Report Authenticated By: Tonia Ghent, M.D.    Antibiotics:  Anti-infectives     Start     Dose/Rate Route Frequency Ordered Stop   10/27/11 0502   tigecycline (TYGACIL) 50 mg in sodium chloride 0.9 % 100 mL IVPB        50 mg 200 mL/hr over 30 Minutes Intravenous Every 12 hours 10/26/11 1703     10/26/11 1715   tigecycline (TYGACIL) 100 mg in sodium chloride 0.9 % 100 mL IVPB        100 mg 200 mL/hr over 30 Minutes Intravenous  Once 10/26/11 1703 10/26/11 1833   10/25/11 1200   ciprofloxacin (CIPRO) IVPB 400 mg        400 mg 200 mL/hr over 60 Minutes Intravenous Every 8 hours 10/25/11 0924     10/25/11 1100   vancomycin (VANCOCIN) IVPB 1000 mg/200 mL premix  Status:  Discontinued        1,000 mg 200 mL/hr over 60 Minutes Intravenous Every 12 hours 10/25/11 0922 10/26/11 1703   10/25/11 1000   metroNIDAZOLE (FLAGYL) tablet 500 mg  Status:  Discontinued        500 mg Oral 3 times per day 10/25/11 0908 10/26/11 1703   10/15/11 1000   vancomycin (VANCOCIN) IVPB 1000 mg/200 mL premix  Status:  Discontinued        1,000 mg 200 mL/hr over 60 Minutes Intravenous Every 12 hours 10/15/11 0715 10/23/11 1431   10/15/11 0800   piperacillin-tazobactam (ZOSYN) IVPB 3.375 g  Status:  Discontinued        3.375 g 12.5 mL/hr over 240 Minutes  Intravenous 3 times per day 10/15/11 0715 10/25/11 0907   10/14/11 2300   vancomycin (VANCOCIN) IVPB 1000  mg/200 mL premix        1,000 mg 200 mL/hr over 60 Minutes Intravenous  Once 10/14/11 2256 10/15/11 0104          Medications: Scheduled Meds:    . ciprofloxacin  400 mg Intravenous Q8H  . docusate sodium  100 mg Oral BID  . feeding supplement  237 mL Oral BID BM  . insulin aspart  0-15 Units Subcutaneous TID WC  . insulin aspart  0-5 Units Subcutaneous QHS  . insulin glargine  30 Units Subcutaneous Q0700  . l-methylfolate-B6-B12  1 tablet Oral BID  . metoprolol tartrate  50 mg Oral BID  . nitroGLYCERIN  0.6 mg Transdermal Daily  . pantoprazole  40 mg Oral Q1200  . rosuvastatin  20 mg Oral q1800  . senna  1 tablet Oral BID  . silver sulfADIAZINE  1 application Topical Daily  . tigecycline (TYGACIL) IVPB  50 mg Intravenous Q12H   Continuous Infusions:   PRN Meds:.acetaminophen, acetaminophen, HYDROmorphone, LORazepam, ondansetron (ZOFRAN) IV, ondansetron, oxyCODONE, polyethylene glycol  Assessment/Plan: [name] is a 55 y.o. 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 Dr. Lajoyce Corners to perform BKA on Thursday.  1) Osteomyelitis with likely abscess: --continue tygacil and cipro,  --agree with BKA   2) Thrombocytopenia:  BETA lactam dcd abx changed  in case they were to blame LOS: 16 days    Acey Lav 10/30/2011, 3:00 PM

## 2011-10-30 NOTE — Progress Notes (Signed)
IP PROGRESS NOTE  Subjective:   Patients feels better. No bleeding noted.  Objective:  Vital signs in last 24 hours: Temp:  [97.9 F (36.6 C)-98.3 F (36.8 C)] 98 F (36.7 C) (12/17 0800) Pulse Rate:  [53-63] 62  (12/17 0800) Resp:  [17-48] 18  (12/17 0800) BP: (127-147)/(54-67) 132/59 mmHg (12/17 0800) SpO2:  [94 %-100 %] 94 % (12/17 0800) Weight change:  Last BM Date: 10/28/11  Intake/Output from previous day: 12/16 0701 - 12/17 0700 In: 1610 [P.O.:860; IV Piggyback:750] Out: 3225 [Urine:3225]  Mouth: mucous membranes moist, pharynx normal without lesions Resp: clear to auscultation bilaterally Cardio: regular rate and rhythm, S1, S2 normal, no murmur, click, rub or gallop GI: soft, non-tender; bowel sounds normal; no masses,  no organomegaly Extremities: Erythema noted. Warm to touch.     Lab Results:  Basename 10/30/11 0500 10/29/11 0530  WBC 4.7 4.0  HGB 8.1* 8.2*  HCT 25.7* 25.7*  PLT 82* 57*    BMET  Basename 10/30/11 0500 10/29/11 0530  NA 137 137  K 3.6 3.8  CL 101 101  CO2 31 28  GLUCOSE 172* 108*  BUN 18 22  CREATININE 1.02 1.09  CALCIUM 7.9* 8.1*   Results for ANDREN, BETHEA (MRN [id number]) as of 10/30/2011 11:47  Ref. Range 10/25/2011 09:30  Heparin Induced Plt Ab Latest Range: Negative  Positive  Patient O.D. No range found 0.571  UFH SRA Result Latest Range: Negative  Negative  UFH Low Dose 0.1 IU/mL No range found 0  UFH Low Dose 0.5 IU/mL No range found 0  UFH High Dose UFH H No range found 0    Medications: I have reviewed the patient's current medications.  Impression/Recs:  1. Thrombocytopenia: Likely medication related (zosyn vs. Heparin) counts  Improving at this point. HIT assay reviewed: positive by AB assay, but negative by SRA. This likely indicate a false positive and less likely clinical HIT. Agree with current management and would still avoid heparin product just in case.  Agree with use of Xarelto if needed after  surgery.   2. Ischemic wound to the right foot: I doubt we are dealing with arterial thrombosis due to HIT.  3. Anemia: Likely due to Fe loss.  Would replace as needed.  Please call with questions.      LOS: 16 days   Kirk Sampley 10/30/2011, 11:45 AM

## 2011-10-30 NOTE — Progress Notes (Signed)
Utilization Review Completed.Trishna Cwik T12/17/2012   

## 2011-10-30 NOTE — Progress Notes (Signed)
Subjective: No new complaints, stable  Objective:  Weight change:   Intake/Output Summary (Last 24 hours) at 10/30/11 1637 Last data filed at 10/30/11 1300  Gross per 24 hour  Intake   1060 ml  Output   2450 ml  Net  -1390 ml  T max 102 Blood pressure 134/59, pulse 61, temperature 97.9 F (36.6 C), temperature source Oral, resp. rate 21, height 5\' 7"  (1.702 m), weight 85.3 kg (188 lb 0.8 oz), SpO2 98.00%.  On Exam alert oriented x3 not in any acute distress Heart:S1S2 RRR, no murmurs Lungs:CTAB, no wheezing or rhonchi. ABD: soft non tender , bowel sounds present. Extremities. Right foot dressing intact    Lab Results: Results for orders placed during the hospital encounter of 10/14/11 (from the past 24 hour(s))  GLUCOSE, CAPILLARY     Status: Abnormal   Collection Time   10/29/11  5:24 PM      Component Value Range   Glucose-Capillary 117 (*) 70 - 99 (mg/dL)  GLUCOSE, CAPILLARY     Status: Abnormal   Collection Time   10/29/11  9:31 PM      Component Value Range   Glucose-Capillary 141 (*) 70 - 99 (mg/dL)  CBC     Status: Abnormal   Collection Time   10/30/11  5:00 AM      Component Value Range   WBC 4.7  4.0 - 10.5 (K/uL)   RBC 3.14 (*) 4.22 - 5.81 (MIL/uL)   Hemoglobin 8.1 (*) 13.0 - 17.0 (g/dL)   HCT 16.1 (*) 09.6 - 52.0 (%)   MCV 81.8  78.0 - 100.0 (fL)   MCH 25.8 (*) 26.0 - 34.0 (pg)   MCHC 31.5  30.0 - 36.0 (g/dL)   RDW 04.5  40.9 - 81.1 (%)   Platelets 82 (*) 150 - 400 (K/uL)  BASIC METABOLIC PANEL     Status: Abnormal   Collection Time   10/30/11  5:00 AM      Component Value Range   Sodium 137  135 - 145 (mEq/L)   Potassium 3.6  3.5 - 5.1 (mEq/L)   Chloride 101  96 - 112 (mEq/L)   CO2 31  19 - 32 (mEq/L)   Glucose, Bld 172 (*) 70 - 99 (mg/dL)   BUN 18  6 - 23 (mg/dL)   Creatinine, Ser 9.14  0.50 - 1.35 (mg/dL)   Calcium 7.9 (*) 8.4 - 10.5 (mg/dL)   GFR calc non Af Amer 80 (*) >90 (mL/min)   GFR calc Af Amer >90  >90 (mL/min)  GLUCOSE, CAPILLARY      Status: Abnormal   Collection Time   10/30/11  7:38 AM      Component Value Range   Glucose-Capillary 145 (*) 70 - 99 (mg/dL)  GLUCOSE, CAPILLARY     Status: Abnormal   Collection Time   10/30/11 12:21 PM      Component Value Range   Glucose-Capillary 122 (*) 70 - 99 (mg/dL)     Micro Results: Recent Results (from the past 240 hour(s))  CULTURE, BLOOD (ROUTINE X 2)     Status: Normal (Preliminary result)   Collection Time   10/25/11  6:20 PM      Component Value Range Status Comment   Specimen Description BLOOD RIGHT ARM   Final    Special Requests BOTTLES DRAWN AEROBIC AND ANAEROBIC 10CC   Final    Setup Time [id number]   Final    Culture     Final  Value:        BLOOD CULTURE RECEIVED NO GROWTH TO DATE CULTURE WILL BE HELD FOR 5 DAYS BEFORE ISSUING A FINAL NEGATIVE REPORT   Report Status PENDING   Incomplete   CULTURE, BLOOD (ROUTINE X 2)     Status: Normal (Preliminary result)   Collection Time   10/25/11  6:35 PM      Component Value Range Status Comment   Specimen Description BLOOD LEFT HAND   Final    Special Requests BOTTLES DRAWN AEROBIC AND ANAEROBIC 10CC   Final    Setup Time [id number]   Final    Culture     Final    Value:        BLOOD CULTURE RECEIVED NO GROWTH TO DATE CULTURE WILL BE HELD FOR 5 DAYS BEFORE ISSUING A FINAL NEGATIVE REPORT   Report Status PENDING   Incomplete     Studies/Results: Ct Angio Chest W/cm &/or Wo Cm  10/15/2011  *RADIOLOGY REPORT*  Clinical Data:  Chest pain with shortness of breath and elevated D- dimer levels.  Diabetes.  Question pulmonary embolism.  CT ANGIOGRAPHY CHEST WITH CONTRAST  Technique:  Multidetector CT imaging of the chest was performed using the standard protocol during bolus administration of intravenous contrast.  Multiplanar CT image reconstructions including MIPs were obtained to evaluate the vascular anatomy.  Contrast: OMNIPAQUE IOHEXOL 300 MG/ML IV SOLN  Comparison:  None.  Findings:  The pulmonary  arteries are well opacified with contrast. There is no evidence of acute pulmonary embolism.  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  The heart is mildly enlarged.  No enlarged mediastinal or hilar lymph nodes are demonstrated.  A pretracheal node demonstrates a fatty hilum.  There is no pleural or pericardial effusion.  The lungs are clear.  Images through the upper abdomen demonstrate contour irregularity of the liver suspicious for cirrhosis.  The spleen appears mildly enlarged.  There are multiple small calcified gallstones.  Review of the MIP images confirms the above findings.  IMPRESSION:  1.  No evidence of acute pulmonary embolism or other acute chest process. 2.  Cholelithiasis. 3.  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  Correlate clinically. 4.  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  Original Report Authenticated By: Gerrianne Scale, M.D.   Mr Foot Right W Wo Contrast  10/15/2011  *RADIOLOGY REPORT*  Clinical Data: Infected diabetic foot ulcer.  MRI OF THE RIGHT FOREFOOT WITHOUT AND WITH CONTRAST  Technique:  Multiplanar, multisequence MR imaging was performed both before and after administration of intravenous contrast.  Contrast: 20mL MULTIHANCE GADOBENATE DIMEGLUMINE 529 MG/ML IV SOLN  Comparison: Right foot radiographs.  Findings: There is a large soft tissue abscess involving the medial aspect of the forefoot it is largely between the first and second toe is extending along the dorsum of the first metatarsal.  As noted on the plain films there is extensive gas formation in the soft tissues.  No obvious changes of septic arthritis but there are findings for osteomyelitis involving the first metatarsal and first proximal phalanx.  There is associated diffuse cellulitis and myofasciitis.  No involvement of the forefoot or visualized hind foot bony structures.  IMPRESSION:  1.  Large soft tissue abscess involving the medial forefoot with gas formation. 2.  Osteomyelitis  involving the first metatarsal and first proximal phalanx. 3.  Diffuse cellulitis and myofasciitis.  Original Report Authenticated By: P. Loralie Champagne, M.D.   Dg Foot 2 Views Left  10/15/2011  *RADIOLOGY  REPORT*  Clinical Data: Penetrating wounds to the ball of the left foot, with worsening erythema and blistering.  LEFT FOOT - 2 VIEW  Comparison: None.  Findings: There is no evidence of fracture or dislocation.  No osseous erosions are seen to suggest osteomyelitis.  The joint spaces are preserved.  There is no evidence of talar subluxation; the subtalar joint is unremarkable in appearance.  Soft tissue swelling and defect are noted along the plantar aspect of the forefoot.  IMPRESSION:  1.  No evidence of fracture or dislocation.  No definite evidence for osteomyelitis, though if there is significant clinical concern for osteomyelitis, MRI could be considered for further evaluation. 2.  Soft tissue defect and swelling along the plantar aspect of the forefoot.  Original Report Authenticated By: Tonia Ghent, M.D.   Dg Foot 2 Views Right  10/15/2011  *RADIOLOGY REPORT*  Clinical Data: Penetrating wounds to the ball of the right foot from blisters, with diffuse erythema and pain.  RIGHT FOOT - 2 VIEW  Comparison: None.  Findings: There is no evidence of fracture or dislocation.  No osseous erosions are seen to suggest osteomyelitis.  The joint spaces are preserved.  There is no evidence of talar subluxation; the subtalar joint is unremarkable in appearance.  Significant diffuse soft tissue air is noted tracking about the first toe and dorsal to the forefoot, concerning for necrotizing fasciitis.  Underlying soft tissue defects are noted along the plantar aspect of the forefoot.  IMPRESSION:  1.  No evidence of fracture or dislocation.  No definite evidence for osteomyelitis, though if there is significant clinical concern for osteomyelitis, MRI could be considered for further evaluation. 2.  Significant  diffuse soft tissue air about the first toe and dorsum of the forefoot, concerning for necrotizing fasciitis.  Findings were discussed with Earley Favor at 01:03 a.m. on 10/14/2011.  Original Report Authenticated By: Tonia Ghent, M.D.   Medications: Scheduled Meds:    . ciprofloxacin  400 mg Intravenous Q8H  . docusate sodium  100 mg Oral BID  . feeding supplement  237 mL Oral BID BM  . insulin aspart  0-15 Units Subcutaneous TID WC  . insulin aspart  0-5 Units Subcutaneous QHS  . insulin glargine  30 Units Subcutaneous Q0700  . l-methylfolate-B6-B12  1 tablet Oral BID  . metoprolol tartrate  50 mg Oral BID  . nitroGLYCERIN  0.6 mg Transdermal Daily  . pantoprazole  40 mg Oral Q1200  . rosuvastatin  20 mg Oral q1800  . senna  1 tablet Oral BID  . silver sulfADIAZINE  1 application Topical Daily  . tigecycline (TYGACIL) IVPB  50 mg Intravenous Q12H   Continuous Infusions:   PRN Meds:.acetaminophen, acetaminophen, HYDROmorphone, LORazepam, ondansetron (ZOFRAN) IV, ondansetron, oxyCODONE, polyethylene glycol  Wound culture from 10/14/2011  Specimen Description  WOUND RIGHT POSTERIOR FOOT   Special Requests  NONE   Gram Stain  NO WBC SEEN NO SQUAMOUS EPITHELIAL CELLS SEEN RARE GRAM NEGATIVE RODS   Culture  ABUNDANT PSEUDOMONAS AERUGINOSA   Report Status  PENDING   Organism ID, Bacteria  PSEUDOMONAS AERUGINOSA   Resulting Agency  [facility]    Culture & Susceptibility     PSEUDOMONAS AERUGINOSA          Antibiotic  Sensitivity  Microscan  Status      CEFEPIME  Sensitive  2  Final      Method:  MIC      CEFTAZIDIME  Sensitive  <=1  Final  Method:  MIC      CIPROFLOXACIN  Sensitive  <=0.25  Final      Method:  MIC      GENTAMICIN  Sensitive  2  Final      Method:  MIC      IMIPENEM  Sensitive  <=1  Final      Method:  MIC      TOBRAMYCIN  Sensitive  <=1  Final      Method:  MIC       Comments  PSEUDOMONAS AERUGINOSA (MIC)        ABUNDANT PSEUDOMONAS AERUGINOSA     Interval history:  Patient is a 55 year old male with a history of CAD, diabetes hypertension, presented with bilateral foot ulcers and right soft tissue infection due to diabetes also on 10/15/2011. Orthopedics was consulted and patient was seen by Dr. Dorene Grebe. Patient underwent a right foot excisional debridement and ray amputation #1 and #2 on 10/17/2011. Per cultures showed Pseudomonas aeruginosa, abscess cultures on 10/17/2011 also showed Klebsiella oxytoca -non ESBL and enterococcus During admission patient also went into atrial flutter, was found to have elevated troponins, was treated for NSTEMI/ demand ischemia with IV heparin and IV Cardizem. Rate has been well controlled now on on by mouth Cardizem and metoprolol. He remained on IV heparin until today when it was discontinued due to bleeding from the right foot wound. Per cardiology recommendations (Dr. Rennis Golden), patient can be started on xarelto for a month and DCCV as out-patient  Assessment/Plan: - Recurrent SIRS - resolved   - Severe thrombocytopenia - unclear cause, platelets improving now. Ruled out DIC. ? Due to beta lactam abx. Transfuse platelets if bleeding. Hit panel back, per Dr. Clelia Croft likely falsely positive, antibiotics likely to be blamed, but still continued to avoid heparin products.  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 Wound cultures growing pseudomonas,  abscess culture on 10/17/2011 shows Klebsiella oxytoca and enterococcus - Dr. Dorene Grebe and Dr. Aldean Baker following. Plan for right BKA on Wednesday.  CAD (coronary artery disease), autologous vein bypass graft / s/p 2 stent placement: went into atrial flutter on admission was found to have elevated troponins, was  treated for NSTEMI/ Demand ischemia with IV heparin and IV Cardizem, now  in NSR.  - Continue with metoprolol/ crestor.  no aspirin due to severe  thrombocytopenia   Atrial flutter with NSTEMI/demand ischemia: Followed by cardiology - Continue metoprolol - DC heparin drip due to low plateleta and bleeding on 10/24/11, start the xarelto after the surgery and if platelets recover for primary stroke prophylaxis. - DCCV outpatient per Dr. Blanchie Dessert recommendations  Hypertension controlled  Diabetes Mellitus:  No further hypoglycemia episodes -  Continue insulin at current regimen, Continue carb modified diet  DVT prophylaxis: nothing for now due to severe thrombocytopenia    Chandlar Staebell 10/30/2011, 4:37 PM [id number]

## 2011-10-30 NOTE — Progress Notes (Signed)
Pharmacy--HIT panel results  ELISA (high sensitivity, low specificity): Positive SRA (high sensitivity, high specificity, "gold standard"): Negative  55 y.o. M started on heparin on 12/2 for Aflutter/NSTEMI with initial plts~123. Plts dropped to 62 on 12/10 at which point heparin was d/ced. Platelets had a low of <5 on 12/12 and have trended back up to 82 today (12/17). With the knowledge of the HIT panel results as well as other causes for thrombocytopenia at the time of the drop in platelets (infection, antibiotics, etc.), true HIT and allergy to heparin products is considered unlikely.  Georgina Pillion, PharmD, BCPS 10/30/2011 9:48 AM

## 2011-10-30 NOTE — Progress Notes (Signed)
ANTIBIOTIC CONSULT NOTE - FOLLOW UP  Pharmacy Consult for Ciprofloxacin Indication: Osteo/Abscess of R foot  No Known Allergies  Patient Measurements: Height: 5\' 7"  (170.2 cm) Weight: 188 lb 0.8 oz (85.3 kg) IBW/kg (Calculated) : 66.1    Vital Signs: Temp: 97.9 F (36.6 C) (12/17 1200) Temp src: Oral (12/17 0354) BP: 134/59 mmHg (12/17 1200) Pulse Rate: 61  (12/17 1200) Intake/Output from previous day: 12/16 0701 - 12/17 0700 In: 1610 [P.O.:860; IV Piggyback:750] Out: 3225 [Urine:3225] Intake/Output from this shift: Total I/O In: 480 [P.O.:480] Out: 400 [Urine:400]  Labs:  Basename 10/30/11 0500 10/29/11 0530 10/28/11 0500  WBC 4.7 4.0 4.1  HGB 8.1* 8.2* 7.5*  PLT 82* 57* 36*  LABCREA -- -- --  CREATININE 1.02 1.09 1.12   Estimated Creatinine Clearance: 83.4 ml/min (by C-G formula based on Cr of 1.02). No results found for this basename: VANCOTROUGH:2,VANCOPEAK:2,VANCORANDOM:2,GENTTROUGH:2,GENTPEAK:2,GENTRANDOM:2,TOBRATROUGH:2,TOBRAPEAK:2,TOBRARND:2,AMIKACINPEAK:2,AMIKACINTROU:2,AMIKACIN:2, in the last 72 hours   Microbiology: Recent Results (from the past 720 hour(s))  WOUND CULTURE     Status: Normal   Collection Time   10/14/11 10:29 PM      Component Value Range Status Comment   Specimen Description WOUND RIGHT POSTERIOR FOOT   Final    Special Requests NONE   Final    Gram Stain     Final    Value: NO WBC SEEN     NO SQUAMOUS EPITHELIAL CELLS SEEN     RARE GRAM NEGATIVE RODS   Culture ABUNDANT PSEUDOMONAS AERUGINOSA   Final    Report Status 10/19/2011 FINAL   Final    Organism ID, Bacteria PSEUDOMONAS AERUGINOSA   Final   MRSA PCR SCREENING     Status: Normal   Collection Time   10/15/11  7:07 AM      Component Value Range Status Comment   MRSA by PCR NEGATIVE  NEGATIVE  Final   SURGICAL PCR SCREEN     Status: Normal   Collection Time   10/17/11  7:51 PM      Component Value Range Status Comment   MRSA, PCR NEGATIVE  NEGATIVE  Final    Staphylococcus  aureus NEGATIVE  NEGATIVE  Final   CULTURE, ROUTINE-ABSCESS     Status: Normal   Collection Time   10/17/11  9:30 PM      Component Value Range Status Comment   Specimen Description ABSCESS FOOT RIGHT   Final    Special Requests NONE   Final    Gram Stain     Final    Value: ABUNDANT WBC PRESENT,BOTH PMN AND MONONUCLEAR     RARE SQUAMOUS EPITHELIAL CELLS PRESENT     FEW GRAM POSITIVE COCCI IN PAIRS     RARE GRAM NEGATIVE RODS   Culture     Final    Value: FEW KLEBSIELLA OXYTOCA     MODERATE ENTEROCOCCUS SPECIES   Report Status 10/21/2011 FINAL   Final    Organism ID, Bacteria KLEBSIELLA OXYTOCA   Final    Organism ID, Bacteria ENTEROCOCCUS SPECIES   Final   ANAEROBIC CULTURE     Status: Normal   Collection Time   10/17/11  9:30 PM      Component Value Range Status Comment   Specimen Description ABSCESS FOOT RIGHT   Final    Special Requests NONE   Final    Gram Stain     Final    Value: ABUNDANT WBC PRESENT,BOTH PMN AND MONONUCLEAR     RARE SQUAMOUS EPITHELIAL CELLS PRESENT  FEW GRAM POSITIVE COCCI IN PAIRS     RARE GRAM NEGATIVE RODS   Culture     Final    Value: FEW BACTEROIDES FRAGILIS     Note: BETA LACTAMASE POSITIVE   Report Status 10/24/2011 FINAL   Final   CULTURE, BLOOD (ROUTINE X 2)     Status: Normal (Preliminary result)   Collection Time   10/25/11  6:20 PM      Component Value Range Status Comment   Specimen Description BLOOD RIGHT ARM   Final    Special Requests BOTTLES DRAWN AEROBIC AND ANAEROBIC 10CC   Final    Setup Time [id number]   Final    Culture     Final    Value:        BLOOD CULTURE RECEIVED NO GROWTH TO DATE CULTURE WILL BE HELD FOR 5 DAYS BEFORE ISSUING A FINAL NEGATIVE REPORT   Report Status PENDING   Incomplete   CULTURE, BLOOD (ROUTINE X 2)     Status: Normal (Preliminary result)   Collection Time   10/25/11  6:35 PM      Component Value Range Status Comment   Specimen Description BLOOD LEFT HAND   Final    Special Requests BOTTLES  DRAWN AEROBIC AND ANAEROBIC 10CC   Final    Setup Time [id number]   Final    Culture     Final    Value:        BLOOD CULTURE RECEIVED NO GROWTH TO DATE CULTURE WILL BE HELD FOR 5 DAYS BEFORE ISSUING A FINAL NEGATIVE REPORT   Report Status PENDING   Incomplete     Anti-infectives     Start     Dose/Rate Route Frequency Ordered Stop   10/27/11 0502   tigecycline (TYGACIL) 50 mg in sodium chloride 0.9 % 100 mL IVPB        50 mg 200 mL/hr over 30 Minutes Intravenous Every 12 hours 10/26/11 1703     10/26/11 1715   tigecycline (TYGACIL) 100 mg in sodium chloride 0.9 % 100 mL IVPB        100 mg 200 mL/hr over 30 Minutes Intravenous  Once 10/26/11 1703 10/26/11 1833   10/25/11 1200   ciprofloxacin (CIPRO) IVPB 400 mg        400 mg 200 mL/hr over 60 Minutes Intravenous Every 8 hours 10/25/11 0924     10/25/11 1100   vancomycin (VANCOCIN) IVPB 1000 mg/200 mL premix  Status:  Discontinued        1,000 mg 200 mL/hr over 60 Minutes Intravenous Every 12 hours 10/25/11 0922 10/26/11 1703   10/25/11 1000   metroNIDAZOLE (FLAGYL) tablet 500 mg  Status:  Discontinued        500 mg Oral 3 times per day 10/25/11 0908 10/26/11 1703   10/15/11 1000   vancomycin (VANCOCIN) IVPB 1000 mg/200 mL premix  Status:  Discontinued        1,000 mg 200 mL/hr over 60 Minutes Intravenous Every 12 hours 10/15/11 0715 10/23/11 1431   10/15/11 0800   piperacillin-tazobactam (ZOSYN) IVPB 3.375 g  Status:  Discontinued        3.375 g 12.5 mL/hr over 240 Minutes Intravenous 3 times per day 10/15/11 0715 10/25/11 0907   10/14/11 2300   vancomycin (VANCOCIN) IVPB 1000 mg/200 mL premix        1,000 mg 200 mL/hr over 60 Minutes Intravenous  Once 10/14/11 2256 10/15/11 0104  Assessment: 55 y.o. M on Cipro/Tygacil per ID for osteo of R foot s/p debridement and partial amputation. Wound Cx (12/1): Pseudomonas aeruginosa (pan-sensitive), Abscess Cx(12/4): Klebsiella Oxytoca (pan-sensitive except resistant to  ampicillin) &Enterococcus (sensitive to ampicillin and vancomycin) & Bacteroides fragilis (no sensitivities reported). SCr: 1.02, CrCl~80 ml/min. Doses remain appropriate for renal function. Noted plans for BKA on Wednesday, consider addressing length of therapy of antibiotics after this procedure. Usual duration ~24-48 hours post amputation if clean cut.   Plan:  1. Continue Cipro 400 mg IV every 8 hours 2. Consider addressing length of therapy of antibiotics after BKA surgery on Wednesday. 3. Will continue to follow renal function, culture results, LOT, and antibiotic de-escalation plans   Rolley Sims 10/30/2011,2:47 PM

## 2011-10-30 NOTE — Progress Notes (Signed)
Patient ID: [name], male   DOB: [birth year], 55 y.o.   MRN: [id number] Plan for right BKA Wednesday, ischemic ulcer worse right foot

## 2011-10-31 LAB — CBC
HCT: 25.8 % — ABNORMAL LOW (ref 39.0–52.0)
Hemoglobin: 8.1 g/dL — ABNORMAL LOW (ref 13.0–17.0)
MCH: 25.9 pg — ABNORMAL LOW (ref 26.0–34.0)
MCHC: 31.4 g/dL (ref 30.0–36.0)
MCV: 82.4 fL (ref 78.0–100.0)
Platelets: 99 K/uL — ABNORMAL LOW (ref 150–400)
RBC: 3.13 MIL/uL — ABNORMAL LOW (ref 4.22–5.81)
RDW: 15.6 % — ABNORMAL HIGH (ref 11.5–15.5)
WBC: 5.8 K/uL (ref 4.0–10.5)

## 2011-10-31 LAB — GLUCOSE, CAPILLARY
Glucose-Capillary: 94 mg/dL (ref 70–99)
Glucose-Capillary: 123 mg/dL — ABNORMAL HIGH (ref 70–99)

## 2011-10-31 LAB — BASIC METABOLIC PANEL WITH GFR
BUN: 19 mg/dL (ref 6–23)
CO2: 30 meq/L (ref 19–32)
Calcium: 7.9 mg/dL — ABNORMAL LOW (ref 8.4–10.5)
Chloride: 102 meq/L (ref 96–112)
Creatinine, Ser: 1.08 mg/dL (ref 0.50–1.35)
GFR calc Af Amer: 86 mL/min — ABNORMAL LOW
GFR calc non Af Amer: 74 mL/min — ABNORMAL LOW
Glucose, Bld: 106 mg/dL — ABNORMAL HIGH (ref 70–99)
Potassium: 4 meq/L (ref 3.5–5.1)
Sodium: 138 meq/L (ref 135–145)

## 2011-10-31 MED ORDER — CLINDAMYCIN PHOSPHATE 600 MG/50ML IV SOLN
600.0000 mg | INTRAVENOUS | Status: DC
  Filled 2011-10-31: qty 50

## 2011-10-31 MED ORDER — CHLORHEXIDINE GLUCONATE 4 % EX LIQD
60.0000 mL | Freq: Once | CUTANEOUS | Status: AC
  Administered 2011-11-01: 4 via TOPICAL
  Filled 2011-10-31: qty 60

## 2011-10-31 NOTE — Progress Notes (Signed)
Interval history upto 10/31/11:   Patient is a 55 year old male with a history of CAD, diabetes hypertension, presented with bilateral foot ulcers and right soft tissue infection due to diabetes also on 10/15/2011. Orthopedics was consulted and patient was seen by Dr. Dorene Grebe. Patient underwent a right foot excisional debridement and ray amputation #1 and #2 on 10/17/2011. Per cultures showed Pseudomonas aeruginosa, abscess cultures on 10/17/2011 also showed Klebsiella oxytoca -non ESBL and enterococcus During admission patient also went into atrial flutter, was found to have elevated troponins, was treated for NSTEMI/ demand ischemia with IV heparin and IV Cardizem. Rate has been well controlled now on on by mouth Cardizem and metoprolol. He remained on IV heparin until 10/24/2011 when it was discontinued due to bleeding from the right foot wound. Per cardiology recommendations (Dr. Rennis Golden), patient can be started on xarelto for a month and [facility] as out-patient. Patient was found to have a critical drop in his platelet count to 12 on 10/25/2011 and was transferred to step down unit. Hit panel was sent and came as falsely positive. Hematology was consulted and patient was seen by Dr. Clelia Croft who also thought that it could be from beta-lactam antibiotics. DIC was ruled out. Patient continued to spike fevers with SIRS, patient was being followed by infectious disease (Dr. Algis Liming), was recommended IV Tigacyl and ciprofloxacin. Plan is for right BKA tomorrow 11/01/2011 per Dr. Lajoyce Corners. Platelet count has been improving, currently at 99.  Subjective: No new complaints, stable  Objective:  Weight change:   Intake/Output Summary (Last 24 hours) at 10/31/11 1202 Last data filed at 10/31/11 0900  Gross per 24 hour  Intake    780 ml  Output   2500 ml  Net  -1720 ml  T max 102 Blood pressure 124/54, pulse 66, temperature 98.8 F (37.1 C), temperature source Oral, resp. rate 24, height 5\' 7"  (1.702 m), weight  83.7 kg (184 lb 8.4 oz), SpO2 98.00%.  On Exam alert oriented x3 not in any acute distress Heart:S1S2 RRR, no murmurs Lungs:CTAB, no wheezing or rhonchi. ABD: soft non tender , bowel sounds present. Extremities. Right foot dressing intact    Lab Results: Results for orders placed during the hospital encounter of 10/14/11 (from the past 24 hour(s))  GLUCOSE, CAPILLARY     Status: Abnormal   Collection Time   10/30/11 12:21 PM      Component Value Range   Glucose-Capillary 122 (*) 70 - 99 (mg/dL)  GLUCOSE, CAPILLARY     Status: Abnormal   Collection Time   10/30/11  4:38 PM      Component Value Range   Glucose-Capillary 161 (*) 70 - 99 (mg/dL)  GLUCOSE, CAPILLARY     Status: Abnormal   Collection Time   10/30/11  9:23 PM      Component Value Range   Glucose-Capillary 133 (*) 70 - 99 (mg/dL)   Comment 1 Notify RN    CBC     Status: Abnormal   Collection Time   10/31/11  7:21 AM      Component Value Range   WBC 5.8  4.0 - 10.5 (K/uL)   RBC 3.13 (*) 4.22 - 5.81 (MIL/uL)   Hemoglobin 8.1 (*) 13.0 - 17.0 (g/dL)   HCT 40.9 (*) 81.1 - 52.0 (%)   MCV 82.4  78.0 - 100.0 (fL)   MCH 25.9 (*) 26.0 - 34.0 (pg)   MCHC 31.4  30.0 - 36.0 (g/dL)   RDW 91.4 (*) 78.2 - 15.5 (%)  Platelets 99 (*) 150 - 400 (K/uL)  BASIC METABOLIC PANEL     Status: Abnormal   Collection Time   10/31/11  7:21 AM      Component Value Range   Sodium 138  135 - 145 (mEq/L)   Potassium 4.0  3.5 - 5.1 (mEq/L)   Chloride 102  96 - 112 (mEq/L)   CO2 30  19 - 32 (mEq/L)   Glucose, Bld 106 (*) 70 - 99 (mg/dL)   BUN 19  6 - 23 (mg/dL)   Creatinine, Ser 1.61  0.50 - 1.35 (mg/dL)   Calcium 7.9 (*) 8.4 - 10.5 (mg/dL)   GFR calc non Af Amer 74 (*) >90 (mL/min)   GFR calc Af Amer 86 (*) >90 (mL/min)  GLUCOSE, CAPILLARY     Status: Normal   Collection Time   10/31/11  7:44 AM      Component Value Range   Glucose-Capillary 99  70 - 99 (mg/dL)  GLUCOSE, CAPILLARY     Status: Normal   Collection Time   10/31/11   8:07 AM      Component Value Range   Glucose-Capillary 94  70 - 99 (mg/dL)     Micro Results: Recent Results (from the past 240 hour(s))  CULTURE, BLOOD (ROUTINE X 2)     Status: Normal (Preliminary result)   Collection Time   10/25/11  6:20 PM      Component Value Range Status Comment   Specimen Description BLOOD RIGHT ARM   Final    Special Requests BOTTLES DRAWN AEROBIC AND ANAEROBIC 10CC   Final    Setup Time [id number]   Final    Culture     Final    Value:        BLOOD CULTURE RECEIVED NO GROWTH TO DATE CULTURE WILL BE HELD FOR 5 DAYS BEFORE ISSUING A FINAL NEGATIVE REPORT   Report Status PENDING   Incomplete   CULTURE, BLOOD (ROUTINE X 2)     Status: Normal (Preliminary result)   Collection Time   10/25/11  6:35 PM      Component Value Range Status Comment   Specimen Description BLOOD LEFT HAND   Final    Special Requests BOTTLES DRAWN AEROBIC AND ANAEROBIC 10CC   Final    Setup Time [id number]   Final    Culture     Final    Value:        BLOOD CULTURE RECEIVED NO GROWTH TO DATE CULTURE WILL BE HELD FOR 5 DAYS BEFORE ISSUING A FINAL NEGATIVE REPORT   Report Status PENDING   Incomplete     Studies/Results: Ct Angio Chest W/cm &/or Wo Cm  10/15/2011  *RADIOLOGY REPORT*  Clinical Data:  Chest pain with shortness of breath and elevated D- dimer levels.  Diabetes.  Question pulmonary embolism.  CT ANGIOGRAPHY CHEST WITH CONTRAST  Technique:  Multidetector CT imaging of the chest was performed using the standard protocol during bolus administration of intravenous contrast.  Multiplanar CT image reconstructions including MIPs were obtained to evaluate the vascular anatomy.  Contrast: OMNIPAQUE IOHEXOL 300 MG/ML IV SOLN  Comparison:  None.  Findings:  The pulmonary arteries are well opacified with contrast. There is no evidence of acute pulmonary embolism.  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  The heart is mildly enlarged.  No enlarged mediastinal or hilar lymph nodes  are demonstrated.  A pretracheal node demonstrates a fatty hilum.  There is no pleural or pericardial effusion.  The  lungs are clear.  Images through the upper abdomen demonstrate contour irregularity of the liver suspicious for cirrhosis.  The spleen appears mildly enlarged.  There are multiple small calcified gallstones.  Review of the MIP images confirms the above findings.  IMPRESSION:  1.  No evidence of acute pulmonary embolism or other acute chest process. 2.  Cholelithiasis. 3.  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  Correlate clinically. 4.  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  Original Report Authenticated By: Gerrianne Scale, M.D.   Mr Foot Right W Wo Contrast  10/15/2011  *RADIOLOGY REPORT*  Clinical Data: Infected diabetic foot ulcer.  MRI OF THE RIGHT FOREFOOT WITHOUT AND WITH CONTRAST  Technique:  Multiplanar, multisequence MR imaging was performed both before and after administration of intravenous contrast.  Contrast: 20mL MULTIHANCE GADOBENATE DIMEGLUMINE 529 MG/ML IV SOLN  Comparison: Right foot radiographs.  Findings: There is a large soft tissue abscess involving the medial aspect of the forefoot it is largely between the first and second toe is extending along the dorsum of the first metatarsal.  As noted on the plain films there is extensive gas formation in the soft tissues.  No obvious changes of septic arthritis but there are findings for osteomyelitis involving the first metatarsal and first proximal phalanx.  There is associated diffuse cellulitis and myofasciitis.  No involvement of the forefoot or visualized hind foot bony structures.  IMPRESSION:  1.  Large soft tissue abscess involving the medial forefoot with gas formation. 2.  Osteomyelitis involving the first metatarsal and first proximal phalanx. 3.  Diffuse cellulitis and myofasciitis.  Original Report Authenticated By: P. Loralie Champagne, M.D.   Dg Foot 2 Views Left  10/15/2011  *RADIOLOGY  REPORT*  Clinical Data: Penetrating wounds to the ball of the left foot, with worsening erythema and blistering.  LEFT FOOT - 2 VIEW  Comparison: None.  Findings: There is no evidence of fracture or dislocation.  No osseous erosions are seen to suggest osteomyelitis.  The joint spaces are preserved.  There is no evidence of talar subluxation; the subtalar joint is unremarkable in appearance.  Soft tissue swelling and defect are noted along the plantar aspect of the forefoot.  IMPRESSION:  1.  No evidence of fracture or dislocation.  No definite evidence for osteomyelitis, though if there is significant clinical concern for osteomyelitis, MRI could be considered for further evaluation. 2.  Soft tissue defect and swelling along the plantar aspect of the forefoot.  Original Report Authenticated By: Tonia Ghent, M.D.   Dg Foot 2 Views Right  10/15/2011  *RADIOLOGY REPORT*  Clinical Data: Penetrating wounds to the ball of the right foot from blisters, with diffuse erythema and pain.  RIGHT FOOT - 2 VIEW  Comparison: None.  Findings: There is no evidence of fracture or dislocation.  No osseous erosions are seen to suggest osteomyelitis.  The joint spaces are preserved.  There is no evidence of talar subluxation; the subtalar joint is unremarkable in appearance.  Significant diffuse soft tissue air is noted tracking about the first toe and dorsal to the forefoot, concerning for necrotizing fasciitis.  Underlying soft tissue defects are noted along the plantar aspect of the forefoot.  IMPRESSION:  1.  No evidence of fracture or dislocation.  No definite evidence for osteomyelitis, though if there is significant clinical concern for osteomyelitis, MRI could be considered for further evaluation. 2.  Significant diffuse soft tissue air about the first toe and dorsum of the forefoot, concerning for necrotizing  fasciitis.  Findings were discussed with Earley Favor at 01:03 a.m. on 10/14/2011.  Original Report Authenticated  By: Tonia Ghent, M.D.   Medications: Scheduled Meds:    . ciprofloxacin  400 mg Intravenous Q8H  . docusate sodium  100 mg Oral BID  . feeding supplement  237 mL Oral BID BM  . insulin aspart  0-15 Units Subcutaneous TID WC  . insulin aspart  0-5 Units Subcutaneous QHS  . insulin glargine  30 Units Subcutaneous Q0700  . l-methylfolate-B6-B12  1 tablet Oral BID  . metoprolol tartrate  50 mg Oral BID  . nitroGLYCERIN  0.6 mg Transdermal Daily  . pantoprazole  40 mg Oral Q1200  . rosuvastatin  20 mg Oral q1800  . senna  1 tablet Oral BID  . silver sulfADIAZINE  1 application Topical Daily  . tigecycline (TYGACIL) IVPB  50 mg Intravenous Q12H   Continuous Infusions:   PRN Meds:.acetaminophen, acetaminophen, HYDROmorphone, LORazepam, ondansetron (ZOFRAN) IV, ondansetron, oxyCODONE, polyethylene glycol  Wound culture from 10/14/2011  Specimen Description  WOUND RIGHT POSTERIOR FOOT   Special Requests  NONE   Gram Stain  NO WBC SEEN NO SQUAMOUS EPITHELIAL CELLS SEEN RARE GRAM NEGATIVE RODS   Culture  ABUNDANT PSEUDOMONAS AERUGINOSA   Report Status  PENDING   Organism ID, Bacteria  PSEUDOMONAS AERUGINOSA   Resulting Agency  [facility]    Culture & Susceptibility     PSEUDOMONAS AERUGINOSA          Antibiotic  Sensitivity  Microscan  Status      CEFEPIME  Sensitive  2  Final      Method:  MIC      CEFTAZIDIME  Sensitive  <=1  Final      Method:  MIC      CIPROFLOXACIN  Sensitive  <=0.25  Final      Method:  MIC      GENTAMICIN  Sensitive  2  Final      Method:  MIC      IMIPENEM  Sensitive  <=1  Final      Method:  MIC      TOBRAMYCIN  Sensitive  <=1  Final      Method:  MIC       Comments  PSEUDOMONAS AERUGINOSA (MIC)        ABUNDANT PSEUDOMONAS AERUGINOSA    Assessment/Plan: - Recurrent SIRS - resolved   - Severe thrombocytopenia - unclear cause, platelets improving now. Ruled out DIC. ? Due to beta lactam abx.  - Hit panel back, per Dr. Clelia Croft likely falsely  positive, antibiotics likely to be blamed, but still continued to avoid heparin products.  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 Wound cultures growing pseudomonas,  abscess culture on 10/17/2011 shows Klebsiella oxytoca and enterococcus - Dr. Aldean Baker following, plan for right BKA tomorrow   CAD (coronary artery disease), autologous vein bypass graft / s/p 2 stent placement: went into atrial flutter this admission and was found to have elevated troponins, was treated for NSTEMI/ Demand ischemia with IV heparin and IV Cardizem, now  in NSR.  - Continue with metoprolol/ crestor.  no aspirin due to severe thrombocytopenia   Atrial flutter with NSTEMI/demand ischemia: Followed by cardiology - Continue metoprolol - Heparin drip dc'ed due to bleeding on 10/24/2011, start the xarelto after the surgery and if platelets recover for primary stroke prophylaxis per cards. -  DCCV outpatient per Dr. Blanchie Dessert recommendations. Please call Dr. Rennis Golden prior to the discharge for recommendations.  Hypertension controlled  Diabetes Mellitus:  Controlled, Continue insulin at current regimen, Continue carb modified diet  DVT prophylaxis: nothing for now due to severe thrombocytopenia    Daveigh Batty 10/31/2011, 12:02 PM

## 2011-10-31 NOTE — Progress Notes (Signed)
Subjective: Patient feels slightly better today  Objective: Weight change:   Intake/Output Summary (Last 24 hours) at 10/31/11 1627 Last data filed at 10/31/11 1600  Gross per 24 hour  Intake    540 ml  Output   3000 ml  Net  -2460 ml   Blood pressure 122/53, pulse 62, temperature 98.3 F (36.8 C), temperature source Oral, resp. rate 25, height 5\' 7"  (1.702 m), weight 184 lb 8.4 oz (83.7 kg), SpO2 95.00%. Temp:  [98.1 F (36.7 C)-98.8 F (37.1 C)] 98.3 F (36.8 C) (12/18 1300) Pulse Rate:  [52-66] 62  (12/18 1300) Resp:  [15-25] 25  (12/18 1300) BP: (117-135)/(46-60) 122/53 mmHg (12/18 1300) SpO2:  [93 %-98 %] 95 % (12/18 1430) Weight:  [184 lb 8.4 oz (83.7 kg)] 184 lb 8.4 oz (83.7 kg) (12/18 0416)  Physical Exam: General: Alert and awake, oriented x3, not in any acute distress. HEENT: anicteric sclera, pupils reactive to light and accommodation, EOMI CVS regular rate, normal r,  no murmur rubs or gallops Chest: clear to auscultation bilaterally, no wheezing, rales or rhonchi Abdomen: soft nontender, nondistended, normal bowel sounds, Extremities: his left ankle with dressing Skin: echymoses  Lymph: no new lymphadenopathy Neuro: nonfocal  Lab Results:  Basename 10/31/11 0721 10/30/11 0500  WBC 5.8 4.7  HGB 8.1* 8.1*  HCT 25.8* 25.7*  PLT 99* 82*   BMET  Basename 10/31/11 0721 10/30/11 0500  NA 138 137  K 4.0 3.6  CL 102 101  CO2 30 31  GLUCOSE 106* 172*  BUN 19 18  CREATININE 1.08 1.02  CALCIUM 7.9* 7.9*    Micro Results: Recent Results (from the past 240 hour(s))  CULTURE, BLOOD (ROUTINE X 2)     Status: Normal (Preliminary result)   Collection Time   10/25/11  6:20 PM      Component Value Range Status Comment   Specimen Description BLOOD RIGHT ARM   Final    Special Requests BOTTLES DRAWN AEROBIC AND ANAEROBIC 10CC   Final    Setup Time [id number]   Final    Culture     Final    Value:        BLOOD CULTURE RECEIVED NO GROWTH TO DATE CULTURE WILL  BE HELD FOR 5 DAYS BEFORE ISSUING A FINAL NEGATIVE REPORT   Report Status PENDING   Incomplete   CULTURE, BLOOD (ROUTINE X 2)     Status: Normal (Preliminary result)   Collection Time   10/25/11  6:35 PM      Component Value Range Status Comment   Specimen Description BLOOD LEFT HAND   Final    Special Requests BOTTLES DRAWN AEROBIC AND ANAEROBIC 10CC   Final    Setup Time [id number]   Final    Culture     Final    Value:        BLOOD CULTURE RECEIVED NO GROWTH TO DATE CULTURE WILL BE HELD FOR 5 DAYS BEFORE ISSUING A FINAL NEGATIVE REPORT   Report Status PENDING   Incomplete     Studies/Results: Ct Angio Chest W/cm &/or Wo Cm  10/15/2011  *RADIOLOGY REPORT*  Clinical Data:  Chest pain with shortness of breath and elevated D- dimer levels.  Diabetes.  Question pulmonary embolism.  CT ANGIOGRAPHY CHEST WITH CONTRAST  Technique:  Multidetector CT imaging of the chest was performed using the standard protocol during bolus administration of intravenous contrast.  Multiplanar CT image reconstructions including MIPs were obtained to evaluate the vascular anatomy.  Contrast:  OMNIPAQUE IOHEXOL 300 MG/ML IV SOLN  Comparison:  None.  Findings:  The pulmonary arteries are well opacified with contrast. There is no evidence of acute pulmonary embolism.  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  The heart is mildly enlarged.  No enlarged mediastinal or hilar lymph nodes are demonstrated.  A pretracheal node demonstrates a fatty hilum.  There is no pleural or pericardial effusion.  The lungs are clear.  Images through the upper abdomen demonstrate contour irregularity of the liver suspicious for cirrhosis.  The spleen appears mildly enlarged.  There are multiple small calcified gallstones.  Review of the MIP images confirms the above findings.  IMPRESSION:  1.  No evidence of acute pulmonary embolism or other acute chest process. 2.  Cholelithiasis. 3.  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   Correlate clinically. 4.  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  Original Report Authenticated By: Gerrianne Scale, M.D.   MRI:  Comparison: MRI 10/15/2011.  Findings: Since the prior examination, the patient has undergone  amputation at the level of the proximal diaphysis of the first and  second metatarsals.  Soft tissues demonstrate extensive edema and enhancement. There  appears be a soft tissue wound over the dorsum of the foot. There  is a rim enhancing fluid collection originating from the medial  margin of the residual base of the first metatarsal extending  distally to the end of the medial aspect of the foot. The  collection also extends over the dorsum of the foot at the level of  the mid metatarsals to the level of the fourth metatarsal. The  collection cannot be discretely measured but is approximately 4.7  cm long by up to 2.8 cm cranial-caudal by 2.5 cm transverse.  IMPRESSION:  1. Status post amputation at the level the bases of the first and  second metatarsals. No evidence of osteomyelitis is identified.  2. Persistent marked cellulitis with a rim enhancing fluid  collection compatible with abscess. As noted above, the collection  extends from approximately the medial aspect of the base of the  first metatarsal distally to the end of the medial aspect of the  foot. At the level of the mid metatarsals, the collection extends  over the dorsum of the foot laterally to the level of the fourth  metatarsal.   10/15/2011  *RADIOLOGY REPORT*  Clinical Data: Infected diabetic foot ulcer.  MRI OF THE RIGHT FOREFOOT WITHOUT AND WITH CONTRAST  Technique:  Multiplanar, multisequence MR imaging was performed both before and after administration of intravenous contrast.  Contrast: 20mL MULTIHANCE GADOBENATE DIMEGLUMINE 529 MG/ML IV SOLN  Comparison: Right foot radiographs.  Findings: There is a large soft tissue abscess involving the medial aspect of the forefoot it  is largely between the first and second toe is extending along the dorsum of the first metatarsal.  As noted on the plain films there is extensive gas formation in the soft tissues.  No obvious changes of septic arthritis but there are findings for osteomyelitis involving the first metatarsal and first proximal phalanx.  There is associated diffuse cellulitis and myofasciitis.  No involvement of the forefoot or visualized hind foot bony structures.  IMPRESSION:  1.  Large soft tissue abscess involving the medial forefoot with gas formation. 2.  Osteomyelitis involving the first metatarsal and first proximal phalanx. 3.  Diffuse cellulitis and myofasciitis.  Original Report Authenticated By: P. Loralie Champagne, M.D.   Dg Foot 2 Views Left  10/15/2011  *RADIOLOGY REPORT*  Clinical Data: Penetrating wounds to the ball of the left foot, with worsening erythema and blistering.  LEFT FOOT - 2 VIEW  Comparison: None.  Findings: There is no evidence of fracture or dislocation.  No osseous erosions are seen to suggest osteomyelitis.  The joint spaces are preserved.  There is no evidence of talar subluxation; the subtalar joint is unremarkable in appearance.  Soft tissue swelling and defect are noted along the plantar aspect of the forefoot.  IMPRESSION:  1.  No evidence of fracture or dislocation.  No definite evidence for osteomyelitis, though if there is significant clinical concern for osteomyelitis, MRI could be considered for further evaluation. 2.  Soft tissue defect and swelling along the plantar aspect of the forefoot.  Original Report Authenticated By: Tonia Ghent, M.D.   Dg Foot 2 Views Right  10/15/2011  *RADIOLOGY REPORT*  Clinical Data: Penetrating wounds to the ball of the right foot from blisters, with diffuse erythema and pain.  RIGHT FOOT - 2 VIEW  Comparison: None.  Findings: There is no evidence of fracture or dislocation.  No osseous erosions are seen to suggest osteomyelitis.  The joint spaces are  preserved.  There is no evidence of talar subluxation; the subtalar joint is unremarkable in appearance.  Significant diffuse soft tissue air is noted tracking about the first toe and dorsal to the forefoot, concerning for necrotizing fasciitis.  Underlying soft tissue defects are noted along the plantar aspect of the forefoot.  IMPRESSION:  1.  No evidence of fracture or dislocation.  No definite evidence for osteomyelitis, though if there is significant clinical concern for osteomyelitis, MRI could be considered for further evaluation. 2.  Significant diffuse soft tissue air about the first toe and dorsum of the forefoot, concerning for necrotizing fasciitis.  Findings were discussed with Earley Favor at 01:03 a.m. on 10/14/2011.  Original Report Authenticated By: Tonia Ghent, M.D.    Antibiotics:  Anti-infectives     Start     Dose/Rate Route Frequency Ordered Stop   11/01/11 0600   clindamycin (CLEOCIN) IVPB 600 mg        600 mg 100 mL/hr over 30 Minutes Intravenous 60 min pre-op 10/31/11 1443     10/27/11 0502   tigecycline (TYGACIL) 50 mg in sodium chloride 0.9 % 100 mL IVPB        50 mg 200 mL/hr over 30 Minutes Intravenous Every 12 hours 10/26/11 1703     10/26/11 1715   tigecycline (TYGACIL) 100 mg in sodium chloride 0.9 % 100 mL IVPB        100 mg 200 mL/hr over 30 Minutes Intravenous  Once 10/26/11 1703 10/26/11 1833   10/25/11 1200   ciprofloxacin (CIPRO) IVPB 400 mg        400 mg 200 mL/hr over 60 Minutes Intravenous Every 8 hours 10/25/11 0924     10/25/11 1100   vancomycin (VANCOCIN) IVPB 1000 mg/200 mL premix  Status:  Discontinued        1,000 mg 200 mL/hr over 60 Minutes Intravenous Every 12 hours 10/25/11 0922 10/26/11 1703   10/25/11 1000   metroNIDAZOLE (FLAGYL) tablet 500 mg  Status:  Discontinued        500 mg Oral 3 times per day 10/25/11 0908 10/26/11 1703   10/15/11 1000   vancomycin (VANCOCIN) IVPB 1000 mg/200 mL premix  Status:  Discontinued        1,000  mg 200 mL/hr over 60 Minutes Intravenous Every 12 hours 10/15/11 0715  10/23/11 1431   10/15/11 0800   piperacillin-tazobactam (ZOSYN) IVPB 3.375 g  Status:  Discontinued        3.375 g 12.5 mL/hr over 240 Minutes Intravenous 3 times per day 10/15/11 0715 10/25/11 0907   10/14/11 2300   vancomycin (VANCOCIN) IVPB 1000 mg/200 mL premix        1,000 mg 200 mL/hr over 60 Minutes Intravenous  Once 10/14/11 2256 10/15/11 0104          Medications: Scheduled Meds:    . chlorhexidine  60 mL Topical Once  . ciprofloxacin  400 mg Intravenous Q8H  . clindamycin (CLEOCIN) IV  600 mg Intravenous 60 min Pre-Op  . docusate sodium  100 mg Oral BID  . feeding supplement  237 mL Oral BID BM  . insulin aspart  0-15 Units Subcutaneous TID WC  . insulin aspart  0-5 Units Subcutaneous QHS  . insulin glargine  30 Units Subcutaneous Q0700  . l-methylfolate-B6-B12  1 tablet Oral BID  . metoprolol tartrate  50 mg Oral BID  . nitroGLYCERIN  0.6 mg Transdermal Daily  . pantoprazole  40 mg Oral Q1200  . rosuvastatin  20 mg Oral q1800  . senna  1 tablet Oral BID  . silver sulfADIAZINE  1 application Topical Daily  . tigecycline (TYGACIL) IVPB  50 mg Intravenous Q12H   Continuous Infusions:   PRN Meds:.acetaminophen, acetaminophen, HYDROmorphone, LORazepam, ondansetron (ZOFRAN) IV, ondansetron, oxyCODONE, polyethylene glycol  Assessment/Plan: Shep Porter is a 55 y.o. 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 Dr. Lajoyce Corners to perform BKA tomorrow.  1) Osteomyelitis with likely abscess: --continue tygacil and cipro,  --agree with BKA  2) Thrombocytopenia:  BETA lactam dcd abx changed  in case they were to blame   LOS: 17 days    Acey Lav 10/31/2011, 4:27 PM

## 2011-10-31 NOTE — Progress Notes (Signed)
Patient ID: [name], male   DOB: 08-06-1956, 55 y.o.   MRN: [id number] Patient has progressive ischemic changes to his right foot status post foot salvage surgery. Patient will be scheduled for a right transtibial amputation tomorrow at 11 AM. Patient is to be n.p.o. after midnight tonight. Patient will need to sign his surgical permit

## 2011-10-31 NOTE — Progress Notes (Signed)
Physical Therapy Treatment Patient Details Name: [name] MRN: [id number] DOB: 1955-12-11 Today's Date: 10/31/2011  PT Assessment/Plan  PT - Assessment/Plan Comments on Treatment Session: Pt with difficulty standing while maintaining RLE NWB precautions, requires multiple attempts and minimal assistance. Pt required moderate encouragement to participate with PT today. Per chart review pt will have BKA tomorrow, will need to perform reevaluation to assess post surgery. Pt interested in CIR if possible.  PT Plan: Discharge plan needs to be updated PT Frequency: Min 3X/week Follow Up Recommendations: 24 hour supervision/assistance;Other (comment);Skilled nursing facility;Inpatient Rehab (If pt can get to Mod. Independent pt can go home without24S.) Equipment Recommended: Defer to next venue;Rolling walker with 5" wheels;3 in 1 bedside comode PT Goals  Acute Rehab PT Goals PT Goal Formulation: With patient Time For Goal Achievement: 7 days Pt will go Sit to Stand: with modified independence PT Goal: Sit to Stand - Progress: Progressing toward goal Pt will go Stand to Sit: with modified independence PT Goal: Stand to Sit - Progress: Progressing toward goal Pt will Transfer Bed to Chair/Chair to Bed: with modified independence PT Transfer Goal: Bed to Chair/Chair to Bed - Progress: Progressing toward goal Pt will Ambulate: >150 feet;with modified independence;with least restrictive assistive device;with gait velocity >(comment) ft/second PT Goal: Ambulate - Progress: Progressing toward goal Pt will Go Up / Down Stairs: 1-2 stairs;with modified independence;with least restrictive assistive device;with rail(s) PT Goal: Up/Down Stairs - Progress: Progressing toward goal  PT Treatment Precautions/Restrictions  Precautions Precautions: Fall Restrictions Weight Bearing Restrictions: Yes RLE Weight Bearing: Non weight bearing Mobility (including Balance) Bed Mobility Bed Mobility:  Yes Supine to Sit: 6: Modified independent (Device/Increase time) Sitting - Scoot to Edge of Bed: 5: Supervision Sitting - Scoot to [place] of Bed Details (indicate cue type and reason): Verbal cues for sequencing, efficiency.  Transfers Transfers: Yes Sit to Stand: From bed;With armrests;From elevated surface;4: Min assist;From chair/3-in-1 Sit to Stand Details (indicate cue type and reason): Verbal cues for maintaining Rt.LE NWB precautions. Verbal cues to utilize Bil. UE to push from bed/armrests. performed 2x.  Stand to Sit: To bed;To elevated surface;To chair/3-in-1;4: Min assist (minguard for safety) Stand to Sit Details: Assist secondary to decreased ability to control descent. Verbal cues to use armrests (did not 1st attempt, did secondary attempt).  Stand Pivot Transfers: 4: Min assist Stand Pivot Transfer Details (indicate cue type and reason): Verbal cues for sequence, cues to negotiate RW and maintain RLE NWB precautions.  Ambulation/Gait Ambulation/Gait: No (pt unable currently. ) Stairs: No Wheelchair Mobility Wheelchair Mobility: No    Exercise  General Exercises - Lower Extremity Ankle Circles/Pumps: AROM;Left;10 reps;Seated End of Session PT - End of Session Equipment Utilized During Treatment: Gait belt;Right ankle foot orthosis Activity Tolerance: Patient limited by fatigue Patient left: with call bell in reach;in chair Nurse Communication: Mobility status for transfers;Weight bearing status General Behavior During Session: [facility] for tasks performed Cognition: [facility] for tasks performed  Sherie Don 10/31/2011, 3:31 PM  Sherie Don) Carleene Mains PT, DPT Acute Rehabilitation [phone]

## 2011-11-01 ENCOUNTER — Inpatient Hospital Stay (HOSPITAL_COMMUNITY): Payer: Self-pay | Admitting: Anesthesiology

## 2011-11-01 ENCOUNTER — Encounter (HOSPITAL_COMMUNITY): Payer: Self-pay | Admitting: Anesthesiology

## 2011-11-01 ENCOUNTER — Other Ambulatory Visit: Payer: Self-pay | Admitting: Orthopedic Surgery

## 2011-11-01 ENCOUNTER — Encounter (HOSPITAL_COMMUNITY)
Admission: EM | Disposition: A | Discharge status: Skilled Nursing Facility | Payer: Self-pay | Source: Home / Self Care | Attending: Internal Medicine

## 2011-11-01 HISTORY — PX: AMPUTATION: SHX166

## 2011-11-01 LAB — BASIC METABOLIC PANEL
BUN: 19 mg/dL (ref 6–23)
CO2: 30 mEq/L (ref 19–32)
Calcium: 8.1 mg/dL — ABNORMAL LOW (ref 8.4–10.5)
Creatinine, Ser: 1.11 mg/dL (ref 0.50–1.35)
GFR calc non Af Amer: 72 mL/min — ABNORMAL LOW (ref >90)
Glucose, Bld: 89 mg/dL (ref 70–99)

## 2011-11-01 LAB — CULTURE, BLOOD (ROUTINE X 2)
Culture  Setup Time: 201212130130
Culture: NO GROWTH

## 2011-11-01 LAB — CBC
Hemoglobin: 9.2 g/dL — ABNORMAL LOW (ref 13.0–17.0)
MCH: 26.6 pg (ref 26.0–34.0)
MCH: 26 pg (ref 26.0–34.0)
MCHC: 32.2 g/dL (ref 30.0–36.0)
MCHC: 31.4 g/dL (ref 30.0–36.0)
MCV: 82.7 fL (ref 78.0–100.0)
Platelets: 128 10*3/uL — ABNORMAL LOW (ref 150–400)
RBC: 3.46 MIL/uL — ABNORMAL LOW (ref 4.22–5.81)
RDW: 16.4 % — ABNORMAL HIGH (ref 11.5–15.5)

## 2011-11-01 LAB — DIFFERENTIAL
Basophils Absolute: 0 10*3/uL (ref 0.0–0.1)
Basophils Relative: 0 % (ref 0–1)
Eosinophils Absolute: 0.1 10*3/uL (ref 0.0–0.7)
Monocytes Relative: 11 % (ref 3–12)
Neutro Abs: 5 10*3/uL (ref 1.7–7.7)
Neutrophils Relative %: 69 % (ref 43–77)

## 2011-11-01 LAB — GLUCOSE, CAPILLARY
Glucose-Capillary: 96 mg/dL (ref 70–99)
Glucose-Capillary: 106 mg/dL — ABNORMAL HIGH (ref 70–99)
Glucose-Capillary: 163 mg/dL — ABNORMAL HIGH (ref 70–99)

## 2011-11-01 LAB — PROTIME-INR
INR: 1.18 (ref 0.00–1.49)
Prothrombin Time: 15.2 seconds (ref 11.6–15.2)

## 2011-11-01 SURGERY — AMPUTATION BELOW KNEE
Anesthesia: General | Site: Leg Lower | Laterality: Right | Wound class: Clean

## 2011-11-01 MED ORDER — METOCLOPRAMIDE HCL 5 MG PO TABS
5.0000 mg | ORAL_TABLET | Freq: Three times a day (TID) | ORAL | Status: DC | PRN
  Filled 2011-11-01: qty 2

## 2011-11-01 MED ORDER — METHOCARBAMOL 100 MG/ML IJ SOLN
500.0000 mg | INTRAVENOUS | Status: AC
  Administered 2011-11-01: 500 mg via INTRAVENOUS
  Filled 2011-11-01 (×2): qty 5

## 2011-11-01 MED ORDER — DOCUSATE SODIUM 100 MG PO CAPS
100.0000 mg | ORAL_CAPSULE | Freq: Two times a day (BID) | ORAL | Status: DC
  Administered 2011-11-01 – 2011-11-10 (×13): 100 mg via ORAL
  Filled 2011-11-01 (×20): qty 1

## 2011-11-01 MED ORDER — WARFARIN SODIUM 1 MG PO TABS: 1.0000 mg | ORAL_TABLET | Freq: Every day | ORAL | Status: DC

## 2011-11-01 MED ORDER — HYDROCODONE-ACETAMINOPHEN 5-500 MG PO TABS: 1.0000 | ORAL_TABLET | Freq: Four times a day (QID) | ORAL | Status: DC | PRN

## 2011-11-01 MED ORDER — CEFAZOLIN SODIUM 1-5 GM-% IV SOLN
1.0000 g | Freq: Four times a day (QID) | INTRAVENOUS | Status: AC
  Administered 2011-11-01 – 2011-11-02 (×3): 1 g via INTRAVENOUS
  Filled 2011-11-01 (×3): qty 50

## 2011-11-01 MED ORDER — RIVAROXABAN 10 MG PO TABS
20.0000 mg | ORAL_TABLET | Freq: Every day | ORAL | Status: DC
  Administered 2011-11-01 – 2011-11-10 (×10): 20 mg via ORAL
  Filled 2011-11-01 (×11): qty 2

## 2011-11-01 MED ORDER — ONDANSETRON HCL 4 MG PO TABS: 4.0000 mg | ORAL_TABLET | Freq: Four times a day (QID) | ORAL | Status: DC | PRN

## 2011-11-01 MED ORDER — HYDROMORPHONE HCL PF 1 MG/ML IJ SOLN
0.5000 mg | INTRAMUSCULAR | Status: DC | PRN
  Administered 2011-11-01 – 2011-11-02 (×4): 1 mg via INTRAVENOUS
  Filled 2011-11-01 (×4): qty 1

## 2011-11-01 MED ORDER — INSULIN ASPART 100 UNIT/ML ~~LOC~~ SOLN
0.0000 [IU] | Freq: Three times a day (TID) | SUBCUTANEOUS | Status: DC
  Administered 2011-11-01: 3 [IU] via SUBCUTANEOUS
  Administered 2011-11-02: 2 [IU] via SUBCUTANEOUS
  Administered 2011-11-02 (×2): 3 [IU] via SUBCUTANEOUS
  Administered 2011-11-03: 12:00:00 5 [IU] via SUBCUTANEOUS
  Administered 2011-11-03: 8 [IU] via SUBCUTANEOUS
  Administered 2011-11-03 – 2011-11-04 (×2): 3 [IU] via SUBCUTANEOUS
  Administered 2011-11-04: 5 [IU] via SUBCUTANEOUS
  Administered 2011-11-04 – 2011-11-05 (×2): 3 [IU] via SUBCUTANEOUS
  Administered 2011-11-05: 2 [IU] via SUBCUTANEOUS
  Administered 2011-11-05: 8 [IU] via SUBCUTANEOUS
  Administered 2011-11-06: 5 [IU] via SUBCUTANEOUS
  Administered 2011-11-06: 8 [IU] via SUBCUTANEOUS
  Administered 2011-11-06: 3 [IU] via SUBCUTANEOUS
  Administered 2011-11-07: 5 [IU] via SUBCUTANEOUS
  Administered 2011-11-07: 3 [IU] via SUBCUTANEOUS
  Administered 2011-11-07 – 2011-11-08 (×2): 5 [IU] via SUBCUTANEOUS
  Administered 2011-11-08: 8 [IU] via SUBCUTANEOUS
  Administered 2011-11-08: 3 [IU] via SUBCUTANEOUS
  Administered 2011-11-09: 2 [IU] via SUBCUTANEOUS
  Administered 2011-11-09: 5 [IU] via SUBCUTANEOUS
  Administered 2011-11-09: 3 [IU] via SUBCUTANEOUS
  Administered 2011-11-10 (×2): 5 [IU] via SUBCUTANEOUS
  Filled 2011-11-01 (×2): qty 3

## 2011-11-01 MED ORDER — ONDANSETRON HCL 4 MG/2ML IJ SOLN
INTRAMUSCULAR | Status: DC | PRN
  Administered 2011-11-01: 4 mg via INTRAVENOUS

## 2011-11-01 MED ORDER — PROPOFOL 10 MG/ML IV EMUL
INTRAVENOUS | Status: DC | PRN
  Administered 2011-11-01: 11:00:00 150 mg via INTRAVENOUS

## 2011-11-01 MED ORDER — OXYCODONE-ACETAMINOPHEN 5-325 MG PO TABS: 1.0000 | ORAL_TABLET | ORAL | Status: DC | PRN

## 2011-11-01 MED ORDER — DIPHENHYDRAMINE HCL 12.5 MG/5ML PO ELIX
12.5000 mg | ORAL_SOLUTION | ORAL | Status: DC | PRN
Start: 2011-11-01 — End: 2011-11-10
  Filled 2011-11-01: qty 10

## 2011-11-01 MED ORDER — SODIUM CHLORIDE 0.9 % IV SOLN
INTRAVENOUS | Status: DC | PRN
  Administered 2011-11-01 (×2): via INTRAVENOUS

## 2011-11-01 MED ORDER — RIVAROXABAN 15 MG PO TABS: 15.0000 mg | ORAL_TABLET | Freq: Two times a day (BID) | ORAL | Status: DC

## 2011-11-01 MED ORDER — PROMETHAZINE HCL 25 MG/ML IJ SOLN
6.2500 mg | INTRAMUSCULAR | Status: DC | PRN
  Filled 2011-11-01: qty 1

## 2011-11-01 MED ORDER — METOCLOPRAMIDE HCL 5 MG/ML IJ SOLN
5.0000 mg | Freq: Three times a day (TID) | INTRAMUSCULAR | Status: DC | PRN
  Filled 2011-11-01: qty 2

## 2011-11-01 MED ORDER — SODIUM CHLORIDE 0.9 % IV SOLN: INTRAVENOUS | Status: DC

## 2011-11-01 MED ORDER — ONDANSETRON HCL 4 MG/2ML IJ SOLN: 4.0000 mg | Freq: Four times a day (QID) | INTRAMUSCULAR | Status: DC | PRN

## 2011-11-01 MED ORDER — HYDROCODONE-ACETAMINOPHEN 5-325 MG PO TABS
1.0000 | ORAL_TABLET | ORAL | Status: DC | PRN
  Administered 2011-11-01 – 2011-11-10 (×22): 2 via ORAL
  Filled 2011-11-01 (×23): qty 2

## 2011-11-01 MED ORDER — METHOCARBAMOL 100 MG/ML IJ SOLN
500.0000 mg | Freq: Four times a day (QID) | INTRAVENOUS | Status: DC | PRN
  Filled 2011-11-01: qty 5

## 2011-11-01 MED ORDER — MAGNESIUM CITRATE PO SOLN
1.0000 | Freq: Once | ORAL | Status: AC | PRN
  Filled 2011-11-01: qty 296

## 2011-11-01 MED ORDER — SUFENTANIL CITRATE 50 MCG/ML IV SOLN
INTRAVENOUS | Status: DC | PRN
  Administered 2011-11-01: 10 ug via INTRAVENOUS

## 2011-11-01 MED ORDER — 0.9 % SODIUM CHLORIDE (POUR BTL) OPTIME
TOPICAL | Status: DC | PRN
  Administered 2011-11-01: 11:00:00 1000 mL

## 2011-11-01 MED ORDER — SODIUM CHLORIDE 0.9 % IV SOLN
INTRAVENOUS | Status: DC
  Administered 2011-11-01: 75 mL/h via INTRAVENOUS

## 2011-11-01 MED ORDER — HYDROMORPHONE HCL PF 1 MG/ML IJ SOLN
0.2500 mg | INTRAMUSCULAR | Status: DC | PRN
  Administered 2011-11-01 (×4): 0.5 mg via INTRAVENOUS

## 2011-11-01 MED ORDER — CEFAZOLIN SODIUM 1-5 GM-% IV SOLN
INTRAVENOUS | Status: DC | PRN
  Administered 2011-11-01: 11:00:00 2 g via INTRAVENOUS

## 2011-11-01 MED ORDER — METHOCARBAMOL 500 MG PO TABS
500.0000 mg | ORAL_TABLET | Freq: Four times a day (QID) | ORAL | Status: DC | PRN
  Administered 2011-11-07 – 2011-11-10 (×7): 500 mg via ORAL
  Filled 2011-11-01 (×7): qty 1

## 2011-11-01 MED ORDER — HYDROMORPHONE HCL PF 1 MG/ML IJ SOLN
INTRAMUSCULAR | Status: AC
  Filled 2011-11-01: qty 1

## 2011-11-01 MED ORDER — CEFAZOLIN SODIUM 1-5 GM-% IV SOLN
INTRAVENOUS | Status: AC
  Filled 2011-11-01: qty 50

## 2011-11-01 SURGICAL SUPPLY — 48 items
BANDAGE COBAN STERILE 6 (GAUZE/BANDAGES/DRESSINGS) ×2 IMPLANT
BANDAGE ESMARK 6X9 LF (GAUZE/BANDAGES/DRESSINGS) ×1 IMPLANT
BANDAGE GAUZE ELAST BULKY 4 IN (GAUZE/BANDAGES/DRESSINGS) ×2 IMPLANT
BLADE SAW RECIP 87.9 MT (BLADE) ×2 IMPLANT
BLADE SURG 21 STRL SS (BLADE) ×2 IMPLANT
BNDG COHESIVE 6X5 TAN STRL LF (GAUZE/BANDAGES/DRESSINGS) ×4 IMPLANT
BNDG ESMARK 6X9 LF (GAUZE/BANDAGES/DRESSINGS) ×2
CLOTH BEACON ORANGE TIMEOUT ST (SAFETY) ×2 IMPLANT
COVER SURGICAL LIGHT HANDLE (MISCELLANEOUS) ×2 IMPLANT
CUFF TOURNIQUET SINGLE 34IN LL (TOURNIQUET CUFF) IMPLANT
CUFF TOURNIQUET SINGLE 44IN (TOURNIQUET CUFF) IMPLANT
DRAIN PENROSE 1/2X12 LTX STRL (WOUND CARE) IMPLANT
DRAPE EXTREMITY T 121X128X90 (DRAPE) ×2 IMPLANT
DRAPE PROXIMA HALF (DRAPES) ×4 IMPLANT
DRAPE U-SHAPE 47X51 STRL (DRAPES) ×4 IMPLANT
DRSG ADAPTIC 3X8 NADH LF (GAUZE/BANDAGES/DRESSINGS) ×2 IMPLANT
DRSG PAD ABDOMINAL 8X10 ST (GAUZE/BANDAGES/DRESSINGS) ×2 IMPLANT
DURAPREP 26ML APPLICATOR (WOUND CARE) ×2 IMPLANT
ELECT REM PT RETURN 9FT ADLT (ELECTROSURGICAL) ×2
ELECTRODE REM PT RTRN 9FT ADLT (ELECTROSURGICAL) ×1 IMPLANT
EVACUATOR 1/8 PVC DRAIN (DRAIN) IMPLANT
GLOVE BIOGEL PI IND STRL 9 (GLOVE) ×1 IMPLANT
GLOVE BIOGEL PI INDICATOR 9 (GLOVE) ×1
GLOVE SURG ORTHO 9.0 STRL STRW (GLOVE) ×2 IMPLANT
GOWN PREVENTION PLUS XLARGE (GOWN DISPOSABLE) ×2 IMPLANT
GOWN SRG XL XLNG 56XLVL 4 (GOWN DISPOSABLE) ×1 IMPLANT
GOWN STRL NON-REIN XL XLG LVL4 (GOWN DISPOSABLE) ×1
KIT BASIN OR (CUSTOM PROCEDURE TRAY) ×2 IMPLANT
KIT ROOM TURNOVER OR (KITS) ×2 IMPLANT
MANIFOLD NEPTUNE II (INSTRUMENTS) ×2 IMPLANT
MICROMATRIX 500MG (Tissue) ×2 IMPLANT
NS IRRIG 1000ML POUR BTL (IV SOLUTION) ×2 IMPLANT
PACK GENERAL/GYN (CUSTOM PROCEDURE TRAY) ×2 IMPLANT
PAD ARMBOARD 7.5X6 YLW CONV (MISCELLANEOUS) ×4 IMPLANT
SOLUTION PARTIC MCRMTRX 500MG (Tissue) ×1 IMPLANT
SPONGE GAUZE 4X4 12PLY (GAUZE/BANDAGES/DRESSINGS) ×2 IMPLANT
SPONGE LAP 18X18 X RAY DECT (DISPOSABLE) IMPLANT
STAPLER VISISTAT 35W (STAPLE) IMPLANT
STOCKINETTE IMPERVIOUS LG (DRAPES) ×2 IMPLANT
SUT PDS AB 1 CT  36 (SUTURE)
SUT PDS AB 1 CT 36 (SUTURE) IMPLANT
SUT SILK 2 0 (SUTURE) ×1
SUT SILK 2-0 18XBRD TIE 12 (SUTURE) ×1 IMPLANT
Surgical Matrix PSMX 7cm x 10cm ×2 IMPLANT
TOWEL OR 17X24 6PK STRL BLUE (TOWEL DISPOSABLE) ×2 IMPLANT
TOWEL OR 17X26 10 PK STRL BLUE (TOWEL DISPOSABLE) ×2 IMPLANT
TUBE ANAEROBIC SPECIMEN COL (MISCELLANEOUS) IMPLANT
WATER STERILE IRR 1000ML POUR (IV SOLUTION) ×2 IMPLANT

## 2011-11-01 NOTE — Progress Notes (Addendum)
Subjective: Status post surgery, denies any chest pain or shortness of breath  Objective: Vital signs in last 24 hours: Filed Vitals:   11/01/11 1319 11/01/11 1320 11/01/11 1321 11/01/11 1359  BP:    156/67  Pulse: 79 78 79 79  Temp:    98.2 F (36.8 C)  TempSrc:      Resp: 15 27 18 20   Height:      Weight:      SpO2: 100% 100% 100% 97%    Intake/Output Summary (Last 24 hours) at 11/01/11 1526 Last data filed at 11/01/11 1430  Gross per 24 hour  Intake   1185 ml  Output   2850 ml  Net  -1665 ml    Weight change: -1.7 kg (-3 lb 12 oz)  General: Alert, awake, oriented x3, in no acute distress. HEENT: No bruits, no goiter. Heart: Regular rate and rhythm, without murmurs, rubs, gallops. Lungs: Clear to auscultation bilaterally. Abdomen: Soft, nontender, nondistended, positive bowel sounds. Extremities: No clubbing cyanosis or edema with positive pedal pulses. Neuro: Grossly intact, nonfocal.    Lab Results: Results for orders placed during the hospital encounter of 10/14/11 (from the past 24 hour(s))  GLUCOSE, CAPILLARY     Status: Abnormal   Collection Time   10/31/11  4:17 PM      Component Value Range   Glucose-Capillary 110 (*) 70 - 99 (mg/dL)  GLUCOSE, CAPILLARY     Status: Abnormal   Collection Time   10/31/11  9:20 PM      Component Value Range   Glucose-Capillary 125 (*) 70 - 99 (mg/dL)  CBC     Status: Abnormal   Collection Time   11/01/11  5:00 AM      Component Value Range   WBC 6.5  4.0 - 10.5 (K/uL)   RBC 3.46 (*) 4.22 - 5.81 (MIL/uL)   Hemoglobin 9.2 (*) 13.0 - 17.0 (g/dL)   HCT 16.1 (*) 09.6 - 52.0 (%)   MCV 82.7  78.0 - 100.0 (fL)   MCH 26.6  26.0 - 34.0 (pg)   MCHC 32.2  30.0 - 36.0 (g/dL)   RDW 04.5 (*) 40.9 - 15.5 (%)   Platelets 127 (*) 150 - 400 (K/uL)  BASIC METABOLIC PANEL     Status: Abnormal   Collection Time   11/01/11  5:00 AM      Component Value Range   Sodium 138  135 - 145 (mEq/L)   Potassium 3.7  3.5 - 5.1 (mEq/L)   Chloride 102  96 - 112 (mEq/L)   CO2 30  19 - 32 (mEq/L)   Glucose, Bld 89  70 - 99 (mg/dL)   BUN 19  6 - 23 (mg/dL)   Creatinine, Ser 8.11  0.50 - 1.35 (mg/dL)   Calcium 8.1 (*) 8.4 - 10.5 (mg/dL)   GFR calc non Af Amer 72 (*) >90 (mL/min)   GFR calc Af Amer 83 (*) >90 (mL/min)  GLUCOSE, CAPILLARY     Status: Normal   Collection Time   11/01/11  8:10 AM      Component Value Range   Glucose-Capillary 96  70 - 99 (mg/dL)  GLUCOSE, CAPILLARY     Status: Abnormal   Collection Time   11/01/11 11:42 AM      Component Value Range   Glucose-Capillary 106 (*) 70 - 99 (mg/dL)     Micro: Recent Results (from the past 240 hour(s))  CULTURE, BLOOD (ROUTINE X 2)     Status: Normal  Collection Time   10/25/11  6:20 PM      Component Value Range Status Comment   Specimen Description BLOOD RIGHT ARM   Final    Special Requests BOTTLES DRAWN AEROBIC AND ANAEROBIC 10CC   Final    Setup Time [id number]   Final    Culture NO GROWTH 5 DAYS   Final    Report Status 11/01/2011 FINAL   Final   CULTURE, BLOOD (ROUTINE X 2)     Status: Normal   Collection Time   10/25/11  6:35 PM      Component Value Range Status Comment   Specimen Description BLOOD LEFT HAND   Final    Special Requests BOTTLES DRAWN AEROBIC AND ANAEROBIC 10CC   Final    Setup Time [id number]   Final    Culture NO GROWTH 5 DAYS   Final    Report Status 11/01/2011 FINAL   Final     Studies/Results: No results found.  Medications:     . ceFAZolin (ANCEF) IV  1 g Intravenous Q6H  . chlorhexidine  60 mL Topical Once  . docusate sodium  100 mg Oral BID  . insulin aspart  0-15 Units Subcutaneous TID WC  . methocarbamol(ROBAXIN) IV  500 mg Intravenous To PACU  . DISCONTD: ciprofloxacin  400 mg Intravenous Q8H  . DISCONTD: clindamycin (CLEOCIN) IV  600 mg Intravenous 60 min Pre-Op  . DISCONTD: docusate sodium  100 mg Oral BID  . DISCONTD: feeding supplement  237 mL Oral BID BM  . DISCONTD: insulin aspart  0-15 Units  Subcutaneous TID WC  . DISCONTD: insulin aspart  0-5 Units Subcutaneous QHS  . DISCONTD: insulin glargine  30 Units Subcutaneous Q0700  . DISCONTD: l-methylfolate-B6-B12  1 tablet Oral BID  . DISCONTD: metoprolol tartrate  50 mg Oral BID  . DISCONTD: nitroGLYCERIN  0.6 mg Transdermal Daily  . DISCONTD: pantoprazole  40 mg Oral Q1200  . DISCONTD: rosuvastatin  20 mg Oral q1800  . DISCONTD: senna  1 tablet Oral BID  . DISCONTD: silver sulfADIAZINE  1 application Topical Daily  . DISCONTD: tigecycline (TYGACIL) IVPB  50 mg Intravenous Q12H     Assessment: Recurrent SIRS - resolved  - Severe thrombocytopenia - unclear cause, platelets improving now. Ruled out DIC. ? Due to beta lactam abx.  - Hit panel back, per Dr. Clelia Croft likely falsely positive, antibiotics likely to be blamed, but still continued to avoid heparin products. 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 Wound cultures growing pseudomonas, abscess culture on 10/17/2011 shows Klebsiella oxytoca and enterococcus  - Dr. Aldean Baker following, plan for right BKA tomorrow, his antibiotics have been discontinued postoperatively.  CAD (coronary artery disease), autologous vein bypass graft / s/p 2 stent placement: went into atrial flutter this admission and was found to have elevated troponins, was treated for NSTEMI/ Demand ischemia with IV heparin and IV Cardizem, now in NSR.  - Continue with metoprolol/ crestor. no aspirin due to severe thrombocytopenia  Atrial flutter with NSTEMI/demand ischemia: Followed by cardiology  - Continue metoprolol  - Heparin drip dc'ed due to bleeding on 10/24/2011, starting for also since platelets covering for primary stroke prophylaxis per cards.  - DCCV outpatient per Dr. Blanchie Dessert recommendations.    Hypertension controlled  Diabetes Mellitus: Controlled, Continue insulin at current  regimen, Continue carb modified diet  DVT prophylaxis:  nothing for now due to severe thrombocytopenia    continue PT,    LOS: 18 days   [facility] 11/01/2011, 3:26 PM

## 2011-11-01 NOTE — Anesthesia Preprocedure Evaluation (Addendum)
Anesthesia Evaluation  Patient identified by MRN, date of birth, ID band Patient awake    Reviewed: Allergy & Precautions, H&P , NPO status , Patient's Chart, lab work & pertinent test results  Airway Mallampati: II TM Distance: >3 FB Neck ROM: Full    Dental  (+) Poor Dentition, Loose and Missing   Pulmonary former smoker (quit 10 years) clear to auscultation  Pulmonary exam normal       Cardiovascular hypertension, Pt. on medications + CAD (s/p [id number], recent non-STEMI), + Past MI (10/14/11 Non-STEMI) and + Peripheral Vascular Disease (s/p foot amp, now for BKA) + dysrhythmias (presented with Afib with RVR, found to have non-STEMI, Echo 12/3 /12 showed normal LVF, EF 55-60%) Atrial Fibrillation Irregular Normal    Neuro/Psych Anxiety    GI/Hepatic   Endo/Other  Diabetes mellitus- (glu 96 at 8:10am), Type 2, Insulin DependentMorbid obesity  Renal/GU      Musculoskeletal   Abdominal (+) obese,   Peds  Hematology   Anesthesia Other Findings   Reproductive/Obstetrics                           Anesthesia Physical Anesthesia Plan  ASA: III  Anesthesia Plan: General   Post-op Pain Management:    Induction:   Airway Management Planned: LMA  Additional Equipment:   Intra-op Plan:   Post-operative Plan:   Informed Consent: I have reviewed the patients History and Physical, chart, labs and discussed the procedure including the risks, benefits and alternatives for the proposed anesthesia with the patient or authorized representative who has indicated his/her understanding and acceptance.   Dental advisory given  Plan Discussed with: CRNA and Surgeon  Anesthesia Plan Comments: (Plan routine monitors, GA- LMA OK)        Anesthesia Quick Evaluation

## 2011-11-01 NOTE — Transfer of Care (Signed)
Immediate Anesthesia Transfer of Care Note  Patient: [name]  Procedure(s) Performed:  AMPUTATION BELOW KNEE - Right Below Knee Amputation  Patient Location: PACU  Anesthesia Type: General  Level of Consciousness: awake, oriented and patient cooperative  Airway & Oxygen Therapy: Patient Spontanous Breathing and Patient connected to nasal cannula oxygen  Post-op Assessment: Report given to PACU RN, Post -op Vital signs reviewed and stable and Patient moving all extremities  Post vital signs: Reviewed and stable  Complications: No apparent anesthesia complications

## 2011-11-01 NOTE — H&P (Signed)
[name] is an 55 y.o. male.   Chief Complaint: Ulceration odor and pain right foot HPI: Patient is a 56 year old gentleman who presents after conservative treatment for limb salvage intervention for his right foot. Patient has ulceration and infection of the bone and soft tissue and presents at this time for a transtibial amputation.  Past Medical History  Diagnosis Date  . CAD (coronary artery disease), autologous vein bypass graft     s/p CABG in 80's or 90's, then cath x2, last in the 90's with DES and BMS placed but not sure where. Dr. Lupita Shutter at [facility]   . HTN (hypertension)   . 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 Diabetes mellitus   . Anxiety   . Headache   . Atrial flutter with rapid ventricular response 10/15/2011  . NSTEMI (non-ST elevated myocardial infarction) 10/17/2011    Past Surgical History  Procedure Date  . Coronary artery bypass graft   . No past surgeries   . Amputation 10/17/2011    Procedure: AMPUTATION RAY;  Surgeon: Cammy Copa;  Location: [facility] OR;  Service: Orthopedics;  Laterality: Right;  First and Second Ray Amputation    Family History  Problem Relation Age of Onset  . Alzheimer's disease Father   . Melanoma Father   . Benign prostatic hyperplasia Father   . Coronary artery disease Father     s/p CABG  . Heart attack Brother   . Coronary artery disease Brother     s/p CABG  . Lymphoma Sister     non Hodgkin's   . Aortic aneurysm Mother   . Anemia Mother    Social History:  reports that he quit smoking about 3 months ago. His smoking use included Cigarettes. He has a 17.5 pack-year smoking history. He does not have any smokeless tobacco history on file. He reports that he does not drink alcohol or use illicit drugs.  Allergies: No Known Allergies  Medications Prior to Admission  Medication Dose Route Frequency Provider Last Rate Last Dose  . 0.9 %  sodium  chloride infusion   Intravenous Once Arman Filter, NP 50 mL/hr at 10/15/11 0006    . acetaminophen (TYLENOL) tablet 650 mg  650 mg Oral Q6H PRN Carlota Raspberry, MD   650 mg at 10/26/11 1554   Or  . acetaminophen (TYLENOL) suppository 650 mg  650 mg Rectal Q6H PRN Carlota Raspberry, MD      . bd getting started take home kit 3/10 ml X 30g syringes 1 kit  1 kit Other Once Ripudeep Jenna Luo, MD   1 kit at 10/18/11 1700  . chlorhexidine (HIBICLENS) 4 % liquid 4 application  60 mL Topical Once Nadara Mustard, MD      . ciprofloxacin (CIPRO) IVPB 400 mg  400 mg Intravenous Q8H Acey Lav, MD   400 mg at 11/01/11 0445  . clindamycin (CLEOCIN) IVPB 600 mg  600 mg Intravenous 60 min Pre-Op Nadara Mustard, MD      . diltiazem (CARDIZEM) injection 10 mg  10 mg Intravenous Once Carlota Raspberry, MD   10 mg at 10/15/11 0447  . diltiazem (CARDIZEM) tablet 30 mg  30 mg Oral Q6H Eda Paschal [place], [place]   30 mg at 10/18/11 1729  . docusate sodium (COLACE) capsule 100 mg  100 mg Oral BID Carlota Raspberry, MD   100 mg at 10/31/11 2231  .  feeding supplement (ENSURE IMMUNE HEALTH) liquid 237 mL  237 mL Oral BID BM Elyse Alexandra Shearer, RD   237 mL at 10/29/11 1600  . gadobenate dimeglumine (MULTIHANCE) injection 15 mL  15 mL Intravenous Once PRN Medication Radiologist   15 mL at 10/21/11 1445  . gadobenate dimeglumine (MULTIHANCE) injection 20 mL  20 mL Intravenous Once PRN Medication Radiologist   20 mL at 10/15/11 0955  . heparin 100 units/mL bolus via infusion 2,500 Units  2,500 Units Intravenous Once Rolley Sims, PHARMD   2,500 Units at 10/15/11 1930  . heparin 100 units/mL bolus via infusion 4,000 Units  4,000 Units Intravenous Once Renaee Munda, PHARMD   4,000 Units at 10/15/11 1339  . heparin ADULT infusion 100 units/ml (25000 units/250 ml)  1,600 Units/hr Intravenous Continuous Ripudeep Jenna Luo, MD 16 mL/hr at 10/19/11 2114 1,600 Units/hr at 10/19/11 2114  . HYDROmorphone (DILAUDID) injection 1 mg  1 mg Intravenous Q4H PRN  Carlota Raspberry, MD   1 mg at 10/25/11 2011  . insulin aspart (novoLOG) injection 0-15 Units  0-15 Units Subcutaneous TID WC Vijaya Akula   2 Units at 10/31/11 1259  . insulin aspart (novoLOG) injection 0-5 Units  0-5 Units Subcutaneous QHS Vijaya Akula   3 Units at 10/19/11 0046  . insulin aspart (novoLOG) injection 10 Units  10 Units Subcutaneous Once Arman Filter, NP   10 Units at 10/15/11 0255  . insulin aspart (novoLOG) injection 6 Units  6 Units Subcutaneous Once Chrles Selley   6 Units at 10/16/11 0005  . insulin glargine (LANTUS) injection 30 Units  30 Units Subcutaneous Q0700 Ripudeep Jenna Luo, MD   30 Units at 10/31/11 0744  . iohexol (OMNIPAQUE) 300 MG/ML injection 100 mL  100 mL Intravenous Once PRN Medication Radiologist   100 mL at 10/15/11 2227  . l-methylfolate-B6-B12 (METANX) 3-35-2 MG per tablet 1 tablet  1 tablet Oral BID Nadara Mustard, MD   1 tablet at 10/31/11 2053  . living well with diabetes book MISC   Does not apply Once Kathlen Mody      . LORazepam (ATIVAN) tablet 1 mg  1 mg Oral Q8H PRN Ripudeep Jenna Luo, MD   1 mg at 10/24/11 2214  . LORazepam (ATIVAN) tablet 1 mg  1 mg Oral Once Ripudeep K Rai, MD   1 mg at 10/23/11 1526  . metoprolol (LOPRESSOR) tablet 50 mg  50 mg Oral BID Sorin Laza   50 mg at 10/31/11 1207  . nitroGLYCERIN (NITRODUR - Dosed in mg/24 hr) patch 0.6 mg  0.6 mg Transdermal Daily Nadara Mustard, MD   0.6 mg at 10/31/11 1222  . ondansetron (ZOFRAN) injection 4 mg  4 mg Intravenous Once Arman Filter, NP   4 mg at 10/15/11 0126  . ondansetron (ZOFRAN) tablet 4 mg  4 mg Oral Q6H PRN Carlota Raspberry, MD       Or  . ondansetron [facility]) injection 4 mg  4 mg Intravenous Q6H PRN Carlota Raspberry, MD      . oxyCODONE (Oxy IR/ROXICODONE) immediate release tablet 5 mg  5 mg Oral Q4H PRN Carlota Raspberry, MD   5 mg at 10/31/11 2232  . pantoprazole (PROTONIX) EC tablet 40 mg  40 mg Oral Q1200 Abelino Derrick, PA   40 mg at 10/31/11 1259  . polyethylene glycol (MIRALAX / GLYCOLAX)  packet 17 g  17 g Oral Daily PRN Carlota Raspberry, MD      .  potassium chloride 10 mEq in 100 mL IVPB  10 mEq Intravenous Q1 Hr x 3 Ripudeep K Rai, MD   10 mEq at 10/21/11 1949  . potassium chloride SA (K-DUR,KLOR-CON) CR tablet 40 mEq  40 mEq Oral Once [employer], [place]   40 mEq at 10/19/11 1013  . potassium chloride SA (K-DUR,KLOR-CON) CR tablet 40 mEq  40 mEq Oral Once Ripudeep Jenna Luo, MD   40 mEq at 10/21/11 1642  . rosuvastatin (CRESTOR) tablet 20 mg  20 mg Oral q1800 Vijaya Akula   20 mg at 10/31/11 1732  . senna (SENOKOT) tablet 8.6 mg  1 tablet Oral BID Carlota Raspberry, MD   8.6 mg at 10/31/11 2231  . silver sulfADIAZINE (SILVADENE) 1 % cream 1 application  1 application Topical Daily Nadara Mustard, MD   1 application at 10/31/11 1222  . sodium chloride 0.9 % bolus 500 mL  500 mL Intravenous Once Sorin Laza   500 mL at 10/25/11 1637  . tigecycline (TYGACIL) 100 mg in sodium chloride 0.9 % 100 mL IVPB  100 mg Intravenous Once Acey Lav, MD   100 mg at 10/26/11 1803   Followed by  . tigecycline (TYGACIL) 50 mg in sodium chloride 0.9 % 100 mL IVPB  50 mg Intravenous Q12H Acey Lav, MD   50 mg at 11/01/11 0556  . vancomycin (VANCOCIN) IVPB 1000 mg/200 mL premix  1,000 mg Intravenous Once Arman Filter, NP   1,000 mg at 10/15/11 0004  . DISCONTD: 0.9 %  sodium chloride infusion   Intravenous Continuous Arman Filter, NP 10 mL/hr at 10/15/11 0449 1,000 mL at 10/15/11 0449  . DISCONTD: 0.9 %  sodium chloride infusion   Intravenous Continuous Ripudeep Jenna Luo, MD 75 mL/hr at 10/23/11 1904    . DISCONTD: 0.9 %  sodium chloride infusion   Intravenous Continuous Sorin Laza 75 mL/hr at 10/26/11 2000    . DISCONTD: aspirin tablet 325 mg  325 mg Oral Daily Vijaya Akula      . DISCONTD: aspirin tablet 325 mg  325 mg Oral Daily Vijaya Akula   325 mg at 10/26/11 1054  . DISCONTD: diltiazem (CARDIZEM LA) 24 hr tablet 120 mg  120 mg Oral Daily Lennette Bihari, MD   120 mg at 10/25/11 0944  . DISCONTD:  diltiazem (CARDIZEM) 100 mg in dextrose 5 % 100 mL infusion  5-15 mg/hr Intravenous Titrated Vijaya Akula   10 mg/hr at 10/16/11 0309  . DISCONTD: heparin ADULT infusion 100 units/ml (25000 units/250 ml)  1,450 Units/hr Intravenous Continuous Laurena Bering, PHARMD 14.5 mL/hr at 10/16/11 2259 1,450 Units/hr at 10/16/11 2259  . DISCONTD: heparin ADULT infusion 100 units/ml (25000 units/250 ml)  1,450 Units/hr Intravenous Continuous Corrie Mckusick Dean 14.5 mL/hr at 10/18/11 1213 1,450 Units/hr at 10/18/11 1213  . DISCONTD: heparin ADULT infusion 100 units/ml (25000 units/250 ml)  1,450 Units/hr Intravenous Continuous Severiano Gilbert, [place]   1,450 Units/hr at 10/23/11 1904  . DISCONTD: heparin injection 5,000 Units  5,000 Units Subcutaneous Q8H Carlota Raspberry, MD      . DISCONTD: HYDROmorphone (DILAUDID) injection 0.25-0.5 mg  0.25-0.5 mg Intravenous Q5 min PRN E. Jairo Ben, MD   0.5 mg at 10/17/11 2249  . DISCONTD: HYDROmorphone (DILAUDID) injection 0.5 mg  0.5 mg Intravenous Q2H PRN Arman Filter, NP   0.5 mg at 10/15/11 0334  . DISCONTD: insulin aspart (novoLOG) injection 0-15 Units  0-15 Units Subcutaneous TID WC Vijaya  Akula   15 Units at 10/16/11 0702  . DISCONTD: insulin aspart (novoLOG) injection 10 Units  10 Units Subcutaneous TID WC Vijaya Akula   10 Units at 10/23/11 1226  . DISCONTD: insulin aspart (novoLOG) injection 5 Units  5 Units Subcutaneous TID WC Vijaya Akula   5 Units at 10/16/11 1722  . DISCONTD: insulin glargine (LANTUS) injection 10 Units  10 Units Subcutaneous Q0700 Vijaya Akula   10 Units at 10/16/11 1247  . DISCONTD: insulin glargine (LANTUS) injection 25 Units  25 Units Subcutaneous Q0700 Vijaya Akula   25 Units at 10/19/11 9147  . DISCONTD: insulin glargine (LANTUS) injection 30 Units  30 Units Subcutaneous Q0700 Ripudeep Jenna Luo, MD   30 Units at 10/20/11 0626  . DISCONTD: insulin glargine (LANTUS) injection 40 Units  40 Units Subcutaneous Q0700 Ripudeep Jenna Luo, MD   40  Units at 10/23/11 0703  . DISCONTD: insulin regular (HUMULIN R,NOVOLIN R) 1 Units/mL in sodium chloride 0.9 % 100 mL infusion   Intravenous Continuous Arman Filter, NP      . DISCONTD: insulin regular (HUMULIN R,NOVOLIN R) 1 Units/mL in sodium chloride 0.9 % 100 mL infusion   Intravenous To Minor Kimberly Ballard Hammons, PHARMD      . DISCONTD: lactated ringers infusion   Intravenous Continuous E. Jairo Ben, MD      . DISCONTD: metoprolol (LOPRESSOR) injection 5 mg  5 mg Intravenous Once Vijaya Akula      . DISCONTD: metoprolol (LOPRESSOR) tablet 50 mg  50 mg Oral BID Lennette Bihari, MD   50 mg at 10/25/11 0945  . DISCONTD: metoprolol tartrate (LOPRESSOR) tablet 12.5 mg  12.5 mg Oral BID Vijaya Akula   12.5 mg at 10/17/11 0928  . DISCONTD: metoprolol tartrate (LOPRESSOR) tablet 25 mg  25 mg Oral BID Eda Paschal [place], [place]      . DISCONTD: metroNIDAZOLE (FLAGYL) tablet 500 mg  500 mg Oral Q8H Acey Lav, MD   500 mg at 10/26/11 1426  . DISCONTD: piperacillin-tazobactam (ZOSYN) IVPB 3.375 g  3.375 g Intravenous Q8H Gary Fleet Abbott, PHARMD   3.375 g at 10/25/11 8295  . DISCONTD: promethazine (PHENERGAN) injection 6.25-12.5 mg  6.25-12.5 mg Intravenous Q15 min PRN E. Jairo Ben, MD      . DISCONTD: sodium chloride irrigation 0.9 %    PRN Corrie Mckusick Dean   3,000 mL at 10/17/11 2155  . DISCONTD: vancomycin (VANCOCIN) IVPB 1000 mg/200 mL premix  1,000 mg Intravenous Q12H Gary Fleet Abbott, PHARMD   1,000 mg at 10/23/11 1115  . DISCONTD: vancomycin (VANCOCIN) IVPB 1000 mg/200 mL premix  1,000 mg Intravenous Q12H Acey Lav, MD   1,000 mg at 10/26/11 1055   Medications Prior to Admission  Medication Sig Dispense Refill  . metFORMIN (GLUCOPHAGE) 500 MG tablet Take 500 mg by mouth 2 (two) times daily with a meal.          Results for orders placed during the hospital encounter of 10/14/11 (from the past 48 hour(s))  GLUCOSE, CAPILLARY     Status: Abnormal   Collection  Time   10/30/11  7:38 AM      Component Value Range Comment   Glucose-Capillary 145 (*) 70 - 99 (mg/dL)   GLUCOSE, CAPILLARY     Status: Abnormal   Collection Time   10/30/11 12:21 PM      Component Value Range Comment   Glucose-Capillary 122 (*) 70 - 99 (mg/dL)   GLUCOSE, CAPILLARY  Status: Abnormal   Collection Time   10/30/11  4:38 PM      Component Value Range Comment   Glucose-Capillary 161 (*) 70 - 99 (mg/dL)   GLUCOSE, CAPILLARY     Status: Abnormal   Collection Time   10/30/11  9:23 PM      Component Value Range Comment   Glucose-Capillary 133 (*) 70 - 99 (mg/dL)    Comment 1 Notify RN     CBC     Status: Abnormal   Collection Time   10/31/11  7:21 AM      Component Value Range Comment   WBC 5.8  4.0 - 10.5 (K/uL)    RBC 3.13 (*) 4.22 - 5.81 (MIL/uL)    Hemoglobin 8.1 (*) 13.0 - 17.0 (g/dL)    HCT 40.9 (*) 81.1 - 52.0 (%)    MCV 82.4  78.0 - 100.0 (fL)    MCH 25.9 (*) 26.0 - 34.0 (pg)    MCHC 31.4  30.0 - 36.0 (g/dL)    RDW 91.4 (*) 78.2 - 15.5 (%)    Platelets 99 (*) 150 - 400 (K/uL) CONSISTENT WITH PREVIOUS RESULT  BASIC METABOLIC PANEL     Status: Abnormal   Collection Time   10/31/11  7:21 AM      Component Value Range Comment   Sodium 138  135 - 145 (mEq/L)    Potassium 4.0  3.5 - 5.1 (mEq/L)    Chloride 102  96 - 112 (mEq/L)    CO2 30  19 - 32 (mEq/L)    Glucose, Bld 106 (*) 70 - 99 (mg/dL)    BUN 19  6 - 23 (mg/dL)    Creatinine, Ser 9.56  0.50 - 1.35 (mg/dL)    Calcium 7.9 (*) 8.4 - 10.5 (mg/dL)    GFR calc non Af Amer 74 (*) >90 (mL/min)    GFR calc Af Amer 86 (*) >90 (mL/min)   GLUCOSE, CAPILLARY     Status: Normal   Collection Time   10/31/11  7:44 AM      Component Value Range Comment   Glucose-Capillary 99  70 - 99 (mg/dL)   GLUCOSE, CAPILLARY     Status: Normal   Collection Time   10/31/11  8:07 AM      Component Value Range Comment   Glucose-Capillary 94  70 - 99 (mg/dL)   GLUCOSE, CAPILLARY     Status: Abnormal   Collection Time    10/31/11 12:31 PM      Component Value Range Comment   Glucose-Capillary 123 (*) 70 - 99 (mg/dL)   GLUCOSE, CAPILLARY     Status: Abnormal   Collection Time   10/31/11  4:17 PM      Component Value Range Comment   Glucose-Capillary 110 (*) 70 - 99 (mg/dL)   GLUCOSE, CAPILLARY     Status: Abnormal   Collection Time   10/31/11  9:20 PM      Component Value Range Comment   Glucose-Capillary 125 (*) 70 - 99 (mg/dL)    No results found.  Review of Systems  All other systems reviewed and are negative.    Blood pressure 118/48, pulse 55, temperature 98.9 F (37.2 C), temperature source Oral, resp. rate 16, height 5\' 7"  (1.702 m), weight 82 kg (180 lb 12.4 oz), SpO2 98.00%. Physical Exam on examination patient has a Wagner grade 3 ulcer of the right foot. There is osteomyelitis abscess and infection of the right foot.  Assessment/Plan Assessment  osteomyelitis abscess ulceration right foot. Plan will plan for a transtibial amputation. Risks and benefits were discussed including infection neurovascular injury nonhealing of the wound need for higher level amputation patient states he understands and wished to proceed at this time.  Shigeko Manard V 11/01/2011, 6:04 AM

## 2011-11-01 NOTE — Anesthesia Postprocedure Evaluation (Signed)
  Anesthesia Post-op Note  Patient: [name]  Procedure(s) Performed:  AMPUTATION BELOW KNEE - Right Below Knee Amputation  Patient Location: PACU  Anesthesia Type: General  Level of Consciousness: awake, alert  and oriented  Airway and Oxygen Therapy: Patient Spontanous Breathing and Patient connected to nasal cannula oxygen  Post-op Pain: mild  Post-op Assessment: Post-op Vital signs reviewed, Patient's Cardiovascular Status Stable, Respiratory Function Stable, Patent Airway, No signs of Nausea or vomiting and Pain level controlled  Post-op Vital Signs: Reviewed and stable  Complications: No apparent anesthesia complications

## 2011-11-01 NOTE — Op Note (Signed)
OPERATIVE REPORT  DATE OF SURGERY: 11/01/2011  PATIENT:  [name],  55 y.o. male  PRE-OPERATIVE DIAGNOSIS:  Right Foot gangrene  POST-OPERATIVE DIAGNOSIS:  Right Foot gangrene  PROCEDURE:  Procedure(s): #1 AMPUTATION BELOW KNEE right leg #2 application of a cell xenograft.  SURGEON:  Surgeon(s): Nadara Mustard, MD  ANESTHESIA:   general  EBL:  Minimal ML  SPECIMEN:  Source of Specimen:  Right leg  TOURNIQUET:  * Missing tourniquet times found for documented tourniquets in log:  [id number] *  PROCEDURE DETAILS: Patient is a 55 year old gentleman with diabetes peripheral vascular disease and insensate neuropathy. Patient has undergone conservative treatment for limb salvage of the right large family. Due to failure of limb salvage patient presents at this time with gangrene osteomyelitis of the right foot and presents for a right transtibial amputation. Risks and benefits were discussed including infection neurovascular injury nonhealing of the wound need for higher level amputation patient states he understands was pursued this time. Description of procedure patient was brought to or room tendon underwent a general anesthetic. After adequate levels and anesthesia were obtained patient's right lower extremity was prepped using DuraPrep draped in the sterile field and the foot was draped out of the sterile field an impervious stockinette. A transverse incision was made 11 cm distal to the tibial tubercle this curved proximally and a large posterior flap was created the tibia was transected 1 cm proximal to the skin incision the tibia was beveled the fibula was transected just proximal to the tibial incision. The amputation knife was used to create a large posterior flap. The sciatic nerve was pulled cut and allowed to retract. The vascular bundles were suture ligated x2 each with 2-0 silk. The tourniquet was deflated after 46 minutes hemostasis was obtained. The deep and superficial layers were  closed using #1 PDS. A cell xenograft was applied to the into the bone to provide a syndesmosis upon healing. A cell powder was applied to the skin and the skin was closed using approximate staples. The wound is covered with Adaptic orthopedic sponges AB dressing web roll and Coban. Patient was extubated taken the PACU in stable condition plan to followup in the office in approximately 3 weeks.  PLAN OF CARE: Admit to inpatient   PATIENT DISPOSITION:  PACU - hemodynamically stable.   Nadara Mustard, MD 11/01/2011 11:38 AM

## 2011-11-02 ENCOUNTER — Encounter (HOSPITAL_COMMUNITY): Payer: Self-pay | Admitting: Orthopedic Surgery

## 2011-11-02 LAB — GLUCOSE, CAPILLARY
Glucose-Capillary: 168 mg/dL — ABNORMAL HIGH (ref 70–99)
Glucose-Capillary: 144 mg/dL — ABNORMAL HIGH (ref 70–99)
Glucose-Capillary: 152 mg/dL — ABNORMAL HIGH (ref 70–99)

## 2011-11-02 LAB — CBC
MCV: 83.1 fL (ref 78.0–100.0)
Platelets: 148 10*3/uL — ABNORMAL LOW (ref 150–400)
RDW: 16.6 % — ABNORMAL HIGH (ref 11.5–15.5)
WBC: 7.2 10*3/uL (ref 4.0–10.5)

## 2011-11-02 LAB — BASIC METABOLIC PANEL
CO2: 30 mEq/L (ref 19–32)
Calcium: 7.9 mg/dL — ABNORMAL LOW (ref 8.4–10.5)
Creatinine, Ser: 1.01 mg/dL (ref 0.50–1.35)
GFR calc non Af Amer: 81 mL/min — ABNORMAL LOW (ref >90)
Sodium: 136 mEq/L (ref 135–145)

## 2011-11-02 MED ORDER — METOPROLOL TARTRATE 50 MG PO TABS
50.0000 mg | ORAL_TABLET | Freq: Two times a day (BID) | ORAL | Status: DC
  Administered 2011-11-02 – 2011-11-10 (×17): 50 mg via ORAL
  Filled 2011-11-02 (×19): qty 1

## 2011-11-02 MED ORDER — ENSURE IMMUNE HEALTH PO LIQD
237.0000 mL | Freq: Two times a day (BID) | ORAL | Status: DC
  Administered 2011-11-03 (×2): 237 mL via ORAL
  Administered 2011-11-05 – 2011-11-06 (×3): via ORAL
  Administered 2011-11-06 – 2011-11-07 (×2): 237 mL via ORAL
  Administered 2011-11-07: 17:00:00 via ORAL
  Administered 2011-11-08: 237 mL via ORAL
  Administered 2011-11-10 (×2): via ORAL
  Filled 2011-11-02 (×9): qty 237

## 2011-11-02 MED ORDER — ROSUVASTATIN CALCIUM 20 MG PO TABS
20.0000 mg | ORAL_TABLET | Freq: Every day | ORAL | Status: DC
  Administered 2011-11-02 – 2011-11-10 (×9): 20 mg via ORAL
  Filled 2011-11-02 (×12): qty 1

## 2011-11-02 NOTE — Progress Notes (Addendum)
Subjective: Pain appears to be controlled, somewhat hypertensive overnight  Objective: Vital signs in last 24 hours: Filed Vitals:   11/02/11 0057 11/02/11 0445 11/02/11 0800 11/02/11 1200  BP: 170/61 143/65 157/73 153/66  Pulse: 79 77 78 78  Temp: 97.9 F (36.6 C) 98 F (36.7 C) 97.9 F (36.6 C) 98.3 F (36.8 C)  TempSrc: Oral Oral Oral Oral  Resp: 25 23 23 24   Height:      Weight:      SpO2: 92% 91% 97% 94%    Intake/Output Summary (Last 24 hours) at 11/02/11 1355 Last data filed at 11/02/11 1331  Gross per 24 hour  Intake 1582.5 ml  Output   2775 ml  Net -1192.5 ml    Weight change:   General: Alert, awake, oriented x3, in no acute distress. HEENT: No bruits, no goiter. Heart: Regular rate and rhythm, without murmurs, rubs, gallops. Lungs: Clear to auscultation bilaterally. Abdomen: Soft, nontender, nondistended, positive bowel sounds. Extremities: No clubbing cyanosis or edema with positive pedal pulses. Neuro: Grossly intact, nonfocal.    Lab Results: Results for orders placed during the hospital encounter of 10/14/11 (from the past 24 hour(s))  CBC     Status: Abnormal   Collection Time   11/01/11  3:18 PM      Component Value Range   WBC 7.3  4.0 - 10.5 (K/uL)   RBC 3.50 (*) 4.22 - 5.81 (MIL/uL)   Hemoglobin 9.1 (*) 13.0 - 17.0 (g/dL)   HCT 11.9 (*) 14.7 - 52.0 (%)   MCV 82.9  78.0 - 100.0 (fL)   MCH 26.0  26.0 - 34.0 (pg)   MCHC 31.4  30.0 - 36.0 (g/dL)   RDW 82.9 (*) 56.2 - 15.5 (%)   Platelets 128 (*) 150 - 400 (K/uL)  DIFFERENTIAL     Status: Normal   Collection Time   11/01/11  3:18 PM      Component Value Range   Neutrophils Relative 69  43 - 77 (%)   Neutro Abs 5.0  1.7 - 7.7 (K/uL)   Lymphocytes Relative 19  12 - 46 (%)   Lymphs Abs 1.4  0.7 - 4.0 (K/uL)   Monocytes Relative 11  3 - 12 (%)   Monocytes Absolute 0.8  0.1 - 1.0 (K/uL)   Eosinophils Relative 1  0 - 5 (%)   Eosinophils Absolute 0.1  0.0 - 0.7 (K/uL)   Basophils Relative 0   0 - 1 (%)   Basophils Absolute 0.0  0.0 - 0.1 (K/uL)  PROTIME-INR     Status: Normal   Collection Time   11/01/11  3:18 PM      Component Value Range   Prothrombin Time 15.2  11.6 - 15.2 (seconds)   INR 1.18  0.00 - 1.49   GLUCOSE, CAPILLARY     Status: Abnormal   Collection Time   11/01/11  5:42 PM      Component Value Range   Glucose-Capillary 163 (*) 70 - 99 (mg/dL)   Comment 1 Documented in Chart     Comment 2 Notify RN    GLUCOSE, CAPILLARY     Status: Abnormal   Collection Time   11/01/11  9:51 PM      Component Value Range   Glucose-Capillary 171 (*) 70 - 99 (mg/dL)   Comment 1 Documented in Chart     Comment 2 Notify RN    BASIC METABOLIC PANEL     Status: Abnormal   Collection Time  11/02/11  5:15 AM      Component Value Range   Sodium 136  135 - 145 (mEq/L)   Potassium 4.5  3.5 - 5.1 (mEq/L)   Chloride 100  96 - 112 (mEq/L)   CO2 30  19 - 32 (mEq/L)   Glucose, Bld 170 (*) 70 - 99 (mg/dL)   BUN 18  6 - 23 (mg/dL)   Creatinine, Ser 1.61  0.50 - 1.35 (mg/dL)   Calcium 7.9 (*) 8.4 - 10.5 (mg/dL)   GFR calc non Af Amer 81 (*) >90 (mL/min)   GFR calc Af Amer >90  >90 (mL/min)  CBC     Status: Abnormal   Collection Time   11/02/11  5:15 AM      Component Value Range   WBC 7.2  4.0 - 10.5 (K/uL)   RBC 3.43 (*) 4.22 - 5.81 (MIL/uL)   Hemoglobin 9.0 (*) 13.0 - 17.0 (g/dL)   HCT 09.6 (*) 04.5 - 52.0 (%)   MCV 83.1  78.0 - 100.0 (fL)   MCH 26.2  26.0 - 34.0 (pg)   MCHC 31.6  30.0 - 36.0 (g/dL)   RDW 40.9 (*) 81.1 - 15.5 (%)   Platelets 148 (*) 150 - 400 (K/uL)  GLUCOSE, CAPILLARY     Status: Abnormal   Collection Time   11/02/11  8:24 AM      Component Value Range   Glucose-Capillary 168 (*) 70 - 99 (mg/dL)  GLUCOSE, CAPILLARY     Status: Abnormal   Collection Time   11/02/11  1:27 PM      Component Value Range   Glucose-Capillary 144 (*) 70 - 99 (mg/dL)     Micro: Recent Results (from the past 240 hour(s))  CULTURE, BLOOD (ROUTINE X 2)     Status:  Normal   Collection Time   10/25/11  6:20 PM      Component Value Range Status Comment   Specimen Description BLOOD RIGHT ARM   Final    Special Requests BOTTLES DRAWN AEROBIC AND ANAEROBIC 10CC   Final    Setup Time [id number]   Final    Culture NO GROWTH 5 DAYS   Final    Report Status 11/01/2011 FINAL   Final   CULTURE, BLOOD (ROUTINE X 2)     Status: Normal   Collection Time   10/25/11  6:35 PM      Component Value Range Status Comment   Specimen Description BLOOD LEFT HAND   Final    Special Requests BOTTLES DRAWN AEROBIC AND ANAEROBIC 10CC   Final    Setup Time [id number]   Final    Culture NO GROWTH 5 DAYS   Final    Report Status 11/01/2011 FINAL   Final     Studies/Results: No results found.  Medications:     . ceFAZolin (ANCEF) IV  1 g Intravenous Q6H  . docusate sodium  100 mg Oral BID  . feeding supplement  237 mL Oral BID BM  . insulin aspart  0-15 Units Subcutaneous TID WC  . metoprolol tartrate  50 mg Oral BID  . rivaroxaban  20 mg Oral Q supper  . rosuvastatin  20 mg Oral q1800  . DISCONTD: ciprofloxacin  400 mg Intravenous Q8H  . DISCONTD: clindamycin (CLEOCIN) IV  600 mg Intravenous 60 min Pre-Op  . DISCONTD: docusate sodium  100 mg Oral BID  . DISCONTD: feeding supplement  237 mL Oral BID BM  . DISCONTD: insulin aspart  0-15  Units Subcutaneous TID WC  . DISCONTD: insulin aspart  0-5 Units Subcutaneous QHS  . DISCONTD: insulin glargine  30 Units Subcutaneous Q0700  . DISCONTD: l-methylfolate-B6-B12  1 tablet Oral BID  . DISCONTD: metoprolol tartrate  50 mg Oral BID  . DISCONTD: nitroGLYCERIN  0.6 mg Transdermal Daily  . DISCONTD: pantoprazole  40 mg Oral Q1200  . DISCONTD: rivaroxaban  15 mg Oral BID PC  . DISCONTD: rosuvastatin  20 mg Oral q1800  . DISCONTD: senna  1 tablet Oral BID  . DISCONTD: silver sulfADIAZINE  1 application Topical Daily  . DISCONTD: tigecycline (TYGACIL) IVPB  50 mg Intravenous Q12H     Assessment:  Severe  thrombocytopenia - unclear cause, platelets improving now. Ruled out DIC. ? Due to beta lactam abx.  - Hit panel back, per Dr. Clelia Croft likely falsely positive, antibiotics likely to be blamed,. 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 Wound cultures growing pseudomonas, abscess culture on 10/17/2011 shows Klebsiella oxytoca and enterococcus  - Dr. Aldean Baker following, status post BKA , his antibiotics have been discontinued postoperatively.  CAD (coronary artery disease), autologous vein bypass graft / s/p 2 stent placement: went into atrial flutter this admission and was found to have elevated troponins, was treated for NSTEMI/ Demand ischemia with IV heparin and IV Cardizem, now in NSR.  - Continue with metoprolol/ crestor. All in aspirin secondary to recent episode of thrombocytopenia Atrial flutter with NSTEMI/demand ischemia: Followed by cardiology  - Continue metoprolol  - Heparin drip dc'ed . He needs primary stroke prophylaxis per cards.  - DCCV outpatient per Dr. Blanchie Dessert recommendations. And started on xarelto  Hypertension controlled  Diabetes Mellitus: Controlled, Continue insulin at current regimen, Continue carb modified diet  DVT prophylaxis: nothing for now due to severe thrombocytopenia    Disposition DC to CIR when bed available   LOS: 19 days   [name] 11/02/2011, 1:55 PM

## 2011-11-02 NOTE — Progress Notes (Addendum)
Subjective: Patient feels slightly better today  Objective: Weight change:   Intake/Output Summary (Last 24 hours) at 11/02/11 1849 Last data filed at 11/02/11 1837  Gross per 24 hour  Intake 1762.5 ml  Output   2650 ml  Net -887.5 ml   Blood pressure 144/57, pulse 67, temperature 97.3 F (36.3 C), temperature source Oral, resp. rate 21, height 5\' 7"  (1.702 m), weight 180 lb 12.4 oz (82 kg), SpO2 97.00%. Temp:  [97.3 F (36.3 C)-98.3 F (36.8 C)] 97.3 F (36.3 C) (12/20 1600) Pulse Rate:  [67-80] 67  (12/20 1600) Resp:  [14-25] 21  (12/20 1600) BP: (130-170)/(52-73) 144/57 mmHg (12/20 1600) SpO2:  [91 %-97 %] 97 % (12/20 1600)  Physical Exam: General: Alert and awake, oriented x3, not in any acute distress. HEENT: anicteric sclera, pupils reactive to light and accommodation, EOMI CVS regular rate, normal r,  no murmur rubs or gallops Chest: clear to auscultation bilaterally, no wheezing, rales or rhonchi Abdomen: soft nontender, nondistended, normal bowel sounds, Extremities: right BKA site clean  Lymph: no new lymphadenopathy Neuro: nonfocal  Lab Results:  Basename 11/02/11 0515 11/01/11 1518  WBC 7.2 7.3  HGB 9.0* 9.1*  HCT 28.5* 29.0*  PLT 148* 128*   BMET  Basename 11/02/11 0515 11/01/11 0500  NA 136 138  K 4.5 3.7  CL 100 102  CO2 30 30  GLUCOSE 170* 89  BUN 18 19  CREATININE 1.01 1.11  CALCIUM 7.9* 8.1*    Micro Results: Recent Results (from the past 240 hour(s))  CULTURE, BLOOD (ROUTINE X 2)     Status: Normal   Collection Time   10/25/11  6:20 PM      Component Value Range Status Comment   Specimen Description BLOOD RIGHT ARM   Final    Special Requests BOTTLES DRAWN AEROBIC AND ANAEROBIC 10CC   Final    Setup Time [id number]   Final    Culture NO GROWTH 5 DAYS   Final    Report Status 11/01/2011 FINAL   Final   CULTURE, BLOOD (ROUTINE X 2)     Status: Normal   Collection Time   10/25/11  6:35 PM      Component Value Range Status Comment    Specimen Description BLOOD LEFT HAND   Final    Special Requests BOTTLES DRAWN AEROBIC AND ANAEROBIC 10CC   Final    Setup Time [id number]   Final    Culture NO GROWTH 5 DAYS   Final    Report Status 11/01/2011 FINAL   Final     Studies/Results: Ct Angio Chest W/cm &/or Wo Cm  10/15/2011  *RADIOLOGY REPORT*  Clinical Data:  Chest pain with shortness of breath and elevated D- dimer levels.  Diabetes.  Question pulmonary embolism.  CT ANGIOGRAPHY CHEST WITH CONTRAST  Technique:  Multidetector CT imaging of the chest was performed using the standard protocol during bolus administration of intravenous contrast.  Multiplanar CT image reconstructions including MIPs were obtained to evaluate the vascular anatomy.  Contrast: OMNIPAQUE IOHEXOL 300 MG/ML IV SOLN  Comparison:  None.  Findings:  The pulmonary arteries are well opacified with contrast. There is no evidence of acute pulmonary embolism.  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  The heart is mildly enlarged.  No enlarged mediastinal or hilar lymph nodes are demonstrated.  A pretracheal node demonstrates a fatty hilum.  There is no pleural or pericardial effusion.  The lungs are clear.  Images through the upper abdomen demonstrate  contour irregularity of the liver suspicious for cirrhosis.  The spleen appears mildly enlarged.  There are multiple small calcified gallstones.  Review of the MIP images confirms the above findings.  IMPRESSION:  1.  No evidence of acute pulmonary embolism or other acute chest process. 2.  Cholelithiasis. 3.  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  Correlate clinically. 4.  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  Original Report Authenticated By: Gerrianne Scale, M.D.   MRI:  Comparison: MRI 10/15/2011.  Findings: Since the prior examination, the patient has undergone  amputation at the level of the proximal diaphysis of the first and  second metatarsals.  Soft tissues demonstrate  extensive edema and enhancement. There  appears be a soft tissue wound over the dorsum of the foot. There  is a rim enhancing fluid collection originating from the medial  margin of the residual base of the first metatarsal extending  distally to the end of the medial aspect of the foot. The  collection also extends over the dorsum of the foot at the level of  the mid metatarsals to the level of the fourth metatarsal. The  collection cannot be discretely measured but is approximately 4.7  cm long by up to 2.8 cm cranial-caudal by 2.5 cm transverse.  IMPRESSION:  1. Status post amputation at the level the bases of the first and  second metatarsals. No evidence of osteomyelitis is identified.  2. Persistent marked cellulitis with a rim enhancing fluid  collection compatible with abscess. As noted above, the collection  extends from approximately the medial aspect of the base of the  first metatarsal distally to the end of the medial aspect of the  foot. At the level of the mid metatarsals, the collection extends  over the dorsum of the foot laterally to the level of the fourth  metatarsal.   10/15/2011  *RADIOLOGY REPORT*  Clinical Data: Infected diabetic foot ulcer.  MRI OF THE RIGHT FOREFOOT WITHOUT AND WITH CONTRAST  Technique:  Multiplanar, multisequence MR imaging was performed both before and after administration of intravenous contrast.  Contrast: 20mL MULTIHANCE GADOBENATE DIMEGLUMINE 529 MG/ML IV SOLN  Comparison: Right foot radiographs.  Findings: There is a large soft tissue abscess involving the medial aspect of the forefoot it is largely between the first and second toe is extending along the dorsum of the first metatarsal.  As noted on the plain films there is extensive gas formation in the soft tissues.  No obvious changes of septic arthritis but there are findings for osteomyelitis involving the first metatarsal and first proximal phalanx.  There is associated diffuse cellulitis and  myofasciitis.  No involvement of the forefoot or visualized hind foot bony structures.  IMPRESSION:  1.  Large soft tissue abscess involving the medial forefoot with gas formation. 2.  Osteomyelitis involving the first metatarsal and first proximal phalanx. 3.  Diffuse cellulitis and myofasciitis.  Original Report Authenticated By: P. Loralie Champagne, M.D.   Dg Foot 2 Views Left  10/15/2011  *RADIOLOGY REPORT*  Clinical Data: Penetrating wounds to the ball of the left foot, with worsening erythema and blistering.  LEFT FOOT - 2 VIEW  Comparison: None.  Findings: There is no evidence of fracture or dislocation.  No osseous erosions are seen to suggest osteomyelitis.  The joint spaces are preserved.  There is no evidence of talar subluxation; the subtalar joint is unremarkable in appearance.  Soft tissue swelling and defect are noted along the plantar aspect of the forefoot.  IMPRESSION:  1.  No evidence of fracture or dislocation.  No definite evidence for osteomyelitis, though if there is significant clinical concern for osteomyelitis, MRI could be considered for further evaluation. 2.  Soft tissue defect and swelling along the plantar aspect of the forefoot.  Original Report Authenticated By: Tonia Ghent, M.D.   Dg Foot 2 Views Right  10/15/2011  *RADIOLOGY REPORT*  Clinical Data: Penetrating wounds to the ball of the right foot from blisters, with diffuse erythema and pain.  RIGHT FOOT - 2 VIEW  Comparison: None.  Findings: There is no evidence of fracture or dislocation.  No osseous erosions are seen to suggest osteomyelitis.  The joint spaces are preserved.  There is no evidence of talar subluxation; the subtalar joint is unremarkable in appearance.  Significant diffuse soft tissue air is noted tracking about the first toe and dorsal to the forefoot, concerning for necrotizing fasciitis.  Underlying soft tissue defects are noted along the plantar aspect of the forefoot.  IMPRESSION:  1.  No evidence of  fracture or dislocation.  No definite evidence for osteomyelitis, though if there is significant clinical concern for osteomyelitis, MRI could be considered for further evaluation. 2.  Significant diffuse soft tissue air about the first toe and dorsum of the forefoot, concerning for necrotizing fasciitis.  Findings were discussed with Earley Favor at 01:03 a.m. on 10/14/2011.  Original Report Authenticated By: Tonia Ghent, M.D.    Antibiotics:  Anti-infectives     Start     Dose/Rate Route Frequency Ordered Stop   11/01/11 1700   ceFAZolin (ANCEF) IVPB 1 g/50 mL premix        1 g 100 mL/hr over 30 Minutes Intravenous Every 6 hours 11/01/11 1359 11/02/11 0507   11/01/11 1052   ceFAZolin (ANCEF) 1-5 GM-% IVPB     Comments: HAWKS, PAM: cabinet override         11/01/11 1052     11/01/11 0600   clindamycin (CLEOCIN) IVPB 600 mg  Status:  Discontinued        600 mg 100 mL/hr over 30 Minutes Intravenous 60 min pre-op 10/31/11 1443 11/01/11 1359   10/27/11 0502   tigecycline (TYGACIL) 50 mg in sodium chloride 0.9 % 100 mL IVPB  Status:  Discontinued        50 mg 200 mL/hr over 30 Minutes Intravenous Every 12 hours 10/26/11 1703 11/01/11 1400   10/26/11 1715   tigecycline (TYGACIL) 100 mg in sodium chloride 0.9 % 100 mL IVPB        100 mg 200 mL/hr over 30 Minutes Intravenous  Once 10/26/11 1703 10/26/11 1833   10/25/11 1200   ciprofloxacin (CIPRO) IVPB 400 mg  Status:  Discontinued        400 mg 200 mL/hr over 60 Minutes Intravenous Every 8 hours 10/25/11 0924 11/01/11 1400   10/25/11 1100   vancomycin (VANCOCIN) IVPB 1000 mg/200 mL premix  Status:  Discontinued        1,000 mg 200 mL/hr over 60 Minutes Intravenous Every 12 hours 10/25/11 0922 10/26/11 1703   10/25/11 1000   metroNIDAZOLE (FLAGYL) tablet 500 mg  Status:  Discontinued        500 mg Oral 3 times per day 10/25/11 0908 10/26/11 1703   10/15/11 1000   vancomycin (VANCOCIN) IVPB 1000 mg/200 mL premix  Status:  Discontinued         1,000 mg 200 mL/hr over 60 Minutes Intravenous Every 12 hours 10/15/11 0715 10/23/11 1431  10/15/11 0800   piperacillin-tazobactam (ZOSYN) IVPB 3.375 g  Status:  Discontinued        3.375 g 12.5 mL/hr over 240 Minutes Intravenous 3 times per day 10/15/11 0715 10/25/11 0907   10/14/11 2300   vancomycin (VANCOCIN) IVPB 1000 mg/200 mL premix        1,000 mg 200 mL/hr over 60 Minutes Intravenous  Once 10/14/11 2256 10/15/11 0104          Medications: Scheduled Meds:    . ceFAZolin (ANCEF) IV  1 g Intravenous Q6H  . docusate sodium  100 mg Oral BID  . feeding supplement  237 mL Oral BID BM  . insulin aspart  0-15 Units Subcutaneous TID WC  . metoprolol tartrate  50 mg Oral BID  . rivaroxaban  20 mg Oral Q supper  . rosuvastatin  20 mg Oral q1800   Continuous Infusions:    . sodium chloride 75 mL/hr at 11/01/11 2019  . sodium chloride     PRN Meds:.diphenhydrAMINE, HYDROcodone-acetaminophen, HYDROmorphone, magnesium citrate, methocarbamol(ROBAXIN) IV, methocarbamol, metoCLOPramide (REGLAN) injection, metoCLOPramide, ondansetron (ZOFRAN) IV, ondansetron, oxyCODONE-acetaminophen  Assessment/Plan: [name] is a 55 y.o. male  With osteomyelitis of diabetic foot ulcer sp ray amputations who had persistent fevers postoperatively, and found on MRI to have a im enhancing fluid collection c/w abscess sp BKA by Dr. Lajoyce Corners with xenograft placement  1) Osteomyelitis with abscess sp BKA, this is now surgically been cured.    2) Thrombocytopenia: Likely due to beta lactam PLEASE DONT GIVE MORE BETA LACTAMS. I would worry that ancef will also cause TTPenia  I will sign off please call with further questions.   LOS: 19 days    [name] 11/02/2011, 6:49 PM

## 2011-11-02 NOTE — Progress Notes (Signed)
Subjective: 1 Day Post-Op Procedure(s) (LRB): AMPUTATION BELOW KNEE (Right) No complaints.    Objective: Vital signs in last 24 hours: Temp:  [97.4 F (36.3 C)-98.2 F (36.8 C)] 98 F (36.7 C) (12/20 0445) Pulse Rate:  [59-89] 77  (12/20 0445) Resp:  [5-27] 23  (12/20 0445) BP: (126-170)/(51-120) 143/65 mmHg (12/20 0445) SpO2:  [91 %-100 %] 91 % (12/20 0445)  Intake/Output from previous day: 12/19 0701 - 12/20 0700 In: 1667.5 [P.O.:360; I.V.:1207.5; IV Piggyback:100] Out: 2675 [Urine:2675] Intake/Output this shift: Total I/O In: 407.5 [I.V.:307.5; IV Piggyback:100] Out: 1300 [Urine:1300]   Basename 11/01/11 1518 11/01/11 0500 10/31/11 0721  HGB 9.1* 9.2* 8.1*    Basename 11/01/11 1518 11/01/11 0500  WBC 7.3 6.5  RBC 3.50* 3.46*  HCT 29.0* 28.6*  PLT 128* 127*    Basename 11/01/11 0500 10/31/11 0721  NA 138 138  K 3.7 4.0  CL 102 102  CO2 30 30  BUN 19 19  CREATININE 1.11 1.08  GLUCOSE 89 106*  CALCIUM 8.1* 7.9*    Basename 11/01/11 1518  LABPT --  INR 1.18    Dressing dry, no complaints  Assessment/Plan: 1 Day Post-Op Procedure(s) (LRB): AMPUTATION BELOW KNEE (Right) PT- NWB right, OK for D/C when safe with PT  Nyoka Alcoser V 11/02/2011, 6:24 AM

## 2011-11-02 NOTE — Progress Notes (Signed)
PT Cancellation Note:  11/02/2011  Treatment cancelled today due to patient's refusal to participate.  Explained importance of PT to pt but he continued to refuse stating he did not want to move his leg at (pain).   Will attempt to see pt again tomorrow.   Canary Fister L. Taelon Bendorf DPT [phone]

## 2011-11-02 NOTE — Progress Notes (Signed)
   CARE MANAGEMENT NOTE 11/02/2011  Patient:  [name],[name]   Account Number:  [id number]  Date Initiated:  10/16/2011  Documentation initiated by:  MAYO,HENRIETTA  Subjective/Objective Assessment:   55 yr-old male adm with (B) foot ulcers, hyperglycemia, AFlutter w/RVR; lives with family.     Action/Plan:   Anticipated DC Date:  11/05/2011   Anticipated DC Plan:  HOME W HOME HEALTH SERVICES  In-house referral  Financial Counselor  Clinical Social Worker      DC Planning Services  CM consult  [facility]  Follow-up appt scheduled      Choice offered to / List presented to:             Status of service:  In process, will continue to follow Medicare Important Message given?   (If response is "NO", the following Medicare IM given date fields will be blank) Date Medicare IM given:   Date Additional Medicare IM given:    Discharge Disposition:    Per UR Regulation:  Reviewed for med. necessity/level of care/duration of stay  Comments:  Contact:  Oliver Barre, niece  [phone] 12/20 spoke w pt, his brother and niece will assist after dc from hosp, have asked md to place cir consult, ortho surg had suggested [facility] for [facility] but [facility] cannot accept anymore self pay pt's, pt agreeable to [facility] for [facility] if needed, debbie Nyiesha Beever rn,bsn 11/01/11 0800 Verdis Prime RN MSN CCM Pt scheduled for BKA today. 12/14 pt [facility], appt on 11/13/11 at 9am w dr Gretel Acre yu at [address], [place], [facility]. debbie Nayomi Tabron rn,bsn 12/10 has hhc agency list for [facility], community prescription care and insulin pt assist for to pt also. 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  Has no PCP, provided information/application packet for [facility] [employer].

## 2011-11-02 NOTE — Progress Notes (Signed)
FOLLOW-UP ADULT NUTRITION ASSESSMENT Date: 11/02/2011   Time: 10:44 AM  ASSESSMENT: Male 55 y.o.  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 CAD (coronary artery disease), autologous vein bypass graft     s/p CABG in 80's or 90's, then cath x2, last in the 90's with DES and BMS placed but not sure where. Dr. Lupita Shutter at [facility]   . HTN (hypertension)   . 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 Diabetes mellitus   . Anxiety   . Headache   . Atrial flutter with rapid ventricular response 10/15/2011  . NSTEMI (non-ST elevated myocardial infarction) 10/17/2011    Scheduled Meds:   . ceFAZolin (ANCEF) IV  1 g Intravenous Q6H  . docusate sodium  100 mg Oral BID  . insulin aspart  0-15 Units Subcutaneous TID WC  . methocarbamol(ROBAXIN) IV  500 mg Intravenous To PACU  . rivaroxaban  20 mg Oral Q supper  . DISCONTD: ciprofloxacin  400 mg Intravenous Q8H  . DISCONTD: clindamycin (CLEOCIN) IV  600 mg Intravenous 60 min Pre-Op  . DISCONTD: docusate sodium  100 mg Oral BID  . DISCONTD: feeding supplement  237 mL Oral BID BM  . DISCONTD: insulin aspart  0-15 Units Subcutaneous TID WC  . DISCONTD: insulin aspart  0-5 Units Subcutaneous QHS  . DISCONTD: insulin glargine  30 Units Subcutaneous Q0700  . DISCONTD: l-methylfolate-B6-B12  1 tablet Oral BID  . DISCONTD: metoprolol tartrate  50 mg Oral BID  . DISCONTD: nitroGLYCERIN  0.6 mg Transdermal Daily  . DISCONTD: pantoprazole  40 mg Oral Q1200  . DISCONTD: rivaroxaban  15 mg Oral BID PC  . DISCONTD: rosuvastatin  20 mg Oral q1800  . DISCONTD: senna  1 tablet Oral BID  . DISCONTD: silver sulfADIAZINE  1 application Topical Daily  . DISCONTD: tigecycline (TYGACIL) IVPB  50 mg Intravenous Q12H   Continuous Infusions:   . sodium chloride 75 mL/hr at 11/01/11 2019  . sodium chloride     PRN  Meds:.diphenhydrAMINE, HYDROcodone-acetaminophen, HYDROmorphone, magnesium citrate, methocarbamol(ROBAXIN) IV, methocarbamol, metoCLOPramide (REGLAN) injection, metoCLOPramide, ondansetron (ZOFRAN) IV, ondansetron, oxyCODONE-acetaminophen, DISCONTD: 0.9 % irrigation (POUR BTL), DISCONTD: acetaminophen, DISCONTD: acetaminophen, DISCONTD: HYDROmorphone, DISCONTD: HYDROmorphone, DISCONTD: LORazepam, DISCONTD: ondansetron (ZOFRAN) IV, DISCONTD: ondansetron DISCONTD: oxyCODONE, DISCONTD: polyethylene glycol, DISCONTD: promethazine   Ht: 5\' 7"  (170.2 cm)  Wt: 180 lb 12.4 oz (82 kg) 12/19 pre-amputation  Ideal Wt: 139.3 lb (63.3 kg) adjusted for BKA % Ideal Wt: not available without post-op weight  Labs:  CMP     Component Value Date/Time   NA 136 11/02/2011 0515   K 4.5 11/02/2011 0515   CL 100 11/02/2011 0515   CO2 30 11/02/2011 0515   GLUCOSE 170* 11/02/2011 0515   BUN 18 11/02/2011 0515   CREATININE 1.01 11/02/2011 0515   CALCIUM 7.9* 11/02/2011 0515   PROT 6.7 10/23/2011 0540   ALBUMIN 1.8* 10/23/2011 0540   AST 30 10/23/2011 0540   ALT 15 10/23/2011 0540   ALKPHOS 64 10/23/2011 0540   BILITOT 0.3 10/23/2011 0540   GFRNONAA 81* 11/02/2011 0515   GFRAA >90 11/02/2011 0515    CBG (last 3)   Basename 11/02/11 0824 11/01/11 2151 11/01/11 1742  GLUCAP 168* 171* 163*     Intake/Output Summary (Last 24 hours) at 11/02/11 1132 Last data filed at 11/02/11 1610  Gross  per 24 hour  Intake 1607.5 ml  Output   2375 ml  Net -767.5 ml    Diet Order: Carb Control Medium, 50-100% of meals  Supplements/Tube Feeding:  Previously on Ensure Immune [employer] chocolate, bid between meals (discontinued with transfer)  IVF:    sodium chloride Last Rate: 75 mL/hr at 11/01/11 2019  sodium chloride     Re-estimated Nutritional Needs:   Kcal: 1700-1900 kcal/day Protein: 85-95 g/day Fluid:  1.9 L/day   NUTRITION DIAGNOSIS:  Malnutrition, progressing  MONITORING/EVALUATION (Goals):   Patient to consume >75% of meals, met  INTERVENTION: Resume Ensure Immune Health bid to optimize nutrient intakes (250 kcal, 9 grams each) and promote healing post-op.  Dietitian #:[phone]  Sanjuan Dame, Sheliah Hatch 11/02/2011, 10:44 AM

## 2011-11-03 DIAGNOSIS — L98499 Non-pressure chronic ulcer of skin of other sites with unspecified severity: Secondary | ICD-10-CM

## 2011-11-03 DIAGNOSIS — S88119A Complete traumatic amputation at level between knee and ankle, unspecified lower leg, initial encounter: Secondary | ICD-10-CM

## 2011-11-03 DIAGNOSIS — I739 Peripheral vascular disease, unspecified: Secondary | ICD-10-CM

## 2011-11-03 LAB — BASIC METABOLIC PANEL
CO2: 32 mEq/L (ref 19–32)
Chloride: 99 mEq/L (ref 96–112)
GFR calc Af Amer: >90 mL/min (ref >90)
Potassium: 4.6 mEq/L (ref 3.5–5.1)

## 2011-11-03 LAB — GLUCOSE, CAPILLARY
Glucose-Capillary: 166 mg/dL — ABNORMAL HIGH (ref 70–99)
Glucose-Capillary: 217 mg/dL — ABNORMAL HIGH (ref 70–99)

## 2011-11-03 MED ORDER — OXYCODONE HCL 10 MG PO TB12
10.0000 mg | ORAL_TABLET | Freq: Two times a day (BID) | ORAL | Status: DC
  Administered 2011-11-03 – 2011-11-05 (×4): 10 mg via ORAL
  Filled 2011-11-03 (×3): qty 1

## 2011-11-03 NOTE — Progress Notes (Signed)
PT NOTE: 11/03/11  Pt re-evaluated by PT.  Acute PT will follow pt.  Suggesting SNF consideration as pt unlikely to tolerate rehab in CIR.    Barbara Ahart L. Charlize Hathaway DPT [phone] 11/03/2011

## 2011-11-03 NOTE — Progress Notes (Signed)
Subjective: Diffuse physical therapy yesterday due to pain  Objective: Vital signs in last 24 hours: Filed Vitals:   11/03/11 0012 11/03/11 0500 11/03/11 0800 11/03/11 1200  BP: 107/44 144/58 100/66 137/53  Pulse: 62 64 77 69  Temp: 99.1 F (37.3 C) 98.9 F (37.2 C) 97.5 F (36.4 C) 98.5 F (36.9 C)  TempSrc: Oral Oral Oral Oral  Resp: 19 19 24 22   Height:      Weight:      SpO2: 97% 100% 98% 97%    Intake/Output Summary (Last 24 hours) at 11/03/11 1408 Last data filed at 11/03/11 0800  Gross per 24 hour  Intake    680 ml  Output   1500 ml  Net   -820 ml    Weight change:  General: Alert, awake, oriented x3, in no acute distress.  HEENT: No bruits, no goiter.  Heart: Regular rate and rhythm, without murmurs, rubs, gallops.  Lungs: Clear to auscultation bilaterally.  Abdomen: Soft, nontender, nondistended, positive bowel sounds.  Extremities: No clubbing cyanosis or edema with positive pedal pulses. Right BKA Neuro: Grossly intact, nonfocal.    Lab Results: Results for orders placed during the hospital encounter of 10/14/11 (from the past 24 hour(s))  GLUCOSE, CAPILLARY     Status: Abnormal   Collection Time   11/02/11  5:13 PM      Component Value Range   Glucose-Capillary 152 (*) 70 - 99 (mg/dL)  GLUCOSE, CAPILLARY     Status: Abnormal   Collection Time   11/02/11  9:39 PM      Component Value Range   Glucose-Capillary 177 (*) 70 - 99 (mg/dL)   Comment 1 Notify RN     Comment 2 Documented in Chart    BASIC METABOLIC PANEL     Status: Abnormal   Collection Time   11/03/11  4:55 AM      Component Value Range   Sodium 134 (*) 135 - 145 (mEq/L)   Potassium 4.6  3.5 - 5.1 (mEq/L)   Chloride 99  96 - 112 (mEq/L)   CO2 32  19 - 32 (mEq/L)   Glucose, Bld 163 (*) 70 - 99 (mg/dL)   BUN 12  6 - 23 (mg/dL)   Creatinine, Ser 1.61  0.50 - 1.35 (mg/dL)   Calcium 8.3 (*) 8.4 - 10.5 (mg/dL)   GFR calc non Af Amer 90 (*) >90 (mL/min)   GFR calc Af Amer >90  >90  (mL/min)  GLUCOSE, CAPILLARY     Status: Abnormal   Collection Time   11/03/11  7:41 AM      Component Value Range   Glucose-Capillary 166 (*) 70 - 99 (mg/dL)   Comment 1 Documented in Chart     Comment 2 Notify RN    GLUCOSE, CAPILLARY     Status: Abnormal   Collection Time   11/03/11 11:42 AM      Component Value Range   Glucose-Capillary 241 (*) 70 - 99 (mg/dL)   Comment 1 Documented in Chart     Comment 2 Notify RN       Micro: Recent Results (from the past 240 hour(s))  CULTURE, BLOOD (ROUTINE X 2)     Status: Normal   Collection Time   10/25/11  6:20 PM      Component Value Range Status Comment   Specimen Description BLOOD RIGHT ARM   Final    Special Requests BOTTLES DRAWN AEROBIC AND ANAEROBIC 10CC   Final  Setup Time [id number]   Final    Culture NO GROWTH 5 DAYS   Final    Report Status 11/01/2011 FINAL   Final   CULTURE, BLOOD (ROUTINE X 2)     Status: Normal   Collection Time   10/25/11  6:35 PM      Component Value Range Status Comment   Specimen Description BLOOD LEFT HAND   Final    Special Requests BOTTLES DRAWN AEROBIC AND ANAEROBIC 10CC   Final    Setup Time [id number]   Final    Culture NO GROWTH 5 DAYS   Final    Report Status 11/01/2011 FINAL   Final     Studies/Results: No results found.  Medications:     . docusate sodium  100 mg Oral BID  . feeding supplement  237 mL Oral BID BM  . insulin aspart  0-15 Units Subcutaneous TID WC  . metoprolol tartrate  50 mg Oral BID  . rivaroxaban  20 mg Oral Q supper  . rosuvastatin  20 mg Oral q1800     Assessment: Severe thrombocytopenia - unclear cause, platelets improving now. Ruled out DIC. ? Due to beta lactam abx.  - Hit panel back, per Dr. Clelia Croft likely falsely positive, antibiotics likely to be blamed,. 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 Wound cultures growing  pseudomonas, abscess culture on 10/17/2011 shows Klebsiella oxytoca and enterococcus  - Dr. Aldean Baker following, status post BKA , his antibiotics have been discontinued postoperatively.  CAD (coronary artery disease), autologous vein bypass graft / s/p 2 stent placement: went into atrial flutter this admission and was found to have elevated troponins, was treated for NSTEMI/ Demand ischemia with IV heparin and IV Cardizem, now in NSR.  - Continue with metoprolol/ crestor. All in aspirin secondary to recent episode of thrombocytopenia  Atrial flutter with NSTEMI/demand ischemia: Followed by cardiology  - Continue metoprolol  - Heparin drip dc'ed . He needs primary stroke prophylaxis per cards.  - DCCV outpatient per Dr. Blanchie Dessert recommendations. And started on xarelto  Hypertension controlled  Diabetes Mellitus: Controlled, Continue insulin at current regimen, Continue carb modified diet    Medically stable to go to CIR, the documentation from physical therapy that the patient is participating.  LOS: 20 days   [facility] 11/03/2011, 2:08 PM

## 2011-11-03 NOTE — Progress Notes (Signed)
Physical Therapy Treatment Patient Details Name: [name] MRN: [id number] DOB: January 10, 1956 Today's Date: 11/03/2011  PT Assessment/Plan  PT - Assessment/Plan Comments on Treatment Session: Pt mostly limited by pain.  Pt present with 20 degree extensor lag in Rt knee.  Instructed pt in positioning for Rt LE to promote Knee extension and prevent flexor contracture. Pt having difficulty pivoting on Lt foot during stand pivot transfer.  Suggesting SNF as pt not likely to tolerate rehab in CIR.    PT Plan: Discharge plan needs to be updated;Frequency needs to be updated PT Frequency: Min 3X/week Follow Up Recommendations: Skilled nursing facility;24 hour supervision/assistance Equipment Recommended: Defer to next venue PT Goals  Acute Rehab PT Goals PT Goal Formulation: With patient Time For Goal Achievement: 2 weeks Pt will go Supine/Side to Sit: Independently;with HOB 0 degrees PT Goal: Supine/Side to Sit - Progress: Progressing toward goal Pt will go Sit to Stand: with modified independence PT Goal: Sit to Stand - Progress: Progressing toward goal Pt will go Stand to Sit: with modified independence PT Goal: Stand to Sit - Progress: Progressing toward goal Pt will Transfer Bed to Chair/Chair to Bed: with modified independence PT Transfer Goal: Bed to Chair/Chair to Bed - Progress: Progressing toward goal Pt will Ambulate: 1 - 15 feet;with supervision;with rolling walker PT Goal: Ambulate - Progress: Not met Pt will Go Up / Down Stairs: 1-2 stairs;with supervision;with least restrictive assistive device PT Goal: Up/Down Stairs - Progress: Not met Pt will Perform Home Exercise Program: Independently PT Goal: Perform Home Exercise Program - Progress: Not met Pt will Propel Wheelchair: 51 - 150 feet;Independently PT Goal: Propel Wheelchair - Progress: Not met  PT Treatment Precautions/Restrictions  Precautions Precautions: Fall Required Braces or Orthoses: No Restrictions Weight  Bearing Restrictions: Yes RLE Weight Bearing: Non weight bearing Mobility (including Balance) Bed Mobility Bed Mobility: Yes Supine to Sit: 4: Min assist;HOB flat;With rails Supine to Sit Details (indicate cue type and reason): cues to flex Rt hip to lift Rt residual limb. cues for technique.  Sitting - Scoot to Edge of Bed: 4: Min assist Sitting - Scoot to [place] of Bed Details (indicate cue type and reason): assist to advance Rt hip due to pain.  Sit to Supine - Right: Not Tested (comment) Transfers Transfers: Yes Sit to Stand: 3: Mod assist;With upper extremity assist;From bed Sit to Stand Details (indicate cue type and reason): assist for balance. Verbal cues for hand placement and NWB on RLE Stand to Sit: To chair/3-in-1;4: Min assist;With upper extremity assist;With armrests Stand to Sit Details: Min A for pt balance and controlled descent. Verbal cues for UE and LE placement. Stand Pivot Transfers: 3: Mod assist;With armrests Stand Pivot Transfer Details (indicate cue type and reason): Verbal cues for sequence, cues to negotiate RW and maintain RLE NWB precautions.  Ambulation/Gait Ambulation/Gait: No Ambulation/Gait Assistance: Not tested (comment) Stairs: No Wheelchair Mobility Wheelchair Mobility: No  Posture/Postural Control Posture/Postural Control: No significant limitations Balance Balance Assessed: No Exercise  Total Joint Exercises Quad Sets: AROM;10 reps;Right;Strengthening;Seated;Supine End of Session PT - End of Session Equipment Utilized During Treatment: Gait belt Activity Tolerance: Patient limited by fatigue;Patient limited by pain Patient left: in chair;with call bell in reach Nurse Communication: Mobility status for transfers;Weight bearing status General Behavior During Session: [facility] for tasks performed Cognition: [facility] for tasks performed  Camela Wich L. Aleeha Boline DPT [phone] Alferd Apa 11/03/2011, 6:57 PM

## 2011-11-03 NOTE — Progress Notes (Signed)
11-03-11  Called PT as / DR. Abrol  To ask if they would please come up and see pt , he rates his pain at a 5 and I told pt to let me know so I could give patient something for pain before they come and I talked with patient and he seems ready to work with them today [employer]

## 2011-11-03 NOTE — Consult Note (Signed)
Physical Medicine and Rehabilitation Consult Reason for Consult: Right below-knee amputation Referring Phsyician: Dr. Ricard Dillon is an 55 y.o. male.   HPI: 55 year old right-handed male with history of diabetes mellitus and severe peripheral vascular disease. Patient with history of right foot infection underwent excisional debridement and ray amputation #1 and #2 in early December. Presented December 19 with nonhealing right foot gangrenous changes. MRI of the right lower tremor that showed an enhancing fluid collection compatible with osteomyelitis. Underwent right below-knee amputation on December 19 per Dr. Lajoyce Corners. Postoperative anemia 9.1 and monitored. Placed on xarelto for deep vein thrombosis prophylaxis. Attempted physical therapy evaluation on December 20th but patient refused to participate secondary to pain. Physical medicine and rehabilitation was consulted at the request of physical therapy to consider inpatient rehabilitation services Review of Systems  Constitutional: Positive for malaise/fatigue. Negative for fever.  Eyes: Negative for double vision.  Respiratory: Negative for cough and shortness of breath.   Cardiovascular: Negative for chest pain.  Gastrointestinal: Positive for constipation. Negative for nausea.  Genitourinary: Negative for dysuria.  Musculoskeletal: Positive for joint pain.  Skin: Negative.   Neurological: Negative for dizziness and headaches.  Psychiatric/Behavioral: Negative.    Past Medical History  Diagnosis Date  . CAD (coronary artery disease), autologous vein bypass graft     s/p CABG in 80's or 90's, then cath x2, last in the 90's with DES and BMS placed but not sure where. Dr. Lupita Shutter at [facility]   . HTN (hypertension)   . 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 Diabetes mellitus   . Anxiety   . Headache   . Atrial flutter with rapid ventricular response 10/15/2011  .  NSTEMI (non-ST elevated myocardial infarction) 10/17/2011   Past Surgical History  Procedure Date  . Coronary artery bypass graft   . No past surgeries   . Amputation 10/17/2011    Procedure: AMPUTATION RAY;  Surgeon: Cammy Copa;  Location: [facility] OR;  Service: Orthopedics;  Laterality: Right;  First and Second Ray Amputation  . Amputation 11/01/2011    Procedure: AMPUTATION BELOW KNEE;  Surgeon: Nadara Mustard, MD;  Location: [facility] OR;  Service: Orthopedics;  Laterality: Right;  Right Below Knee Amputation   Family History  Problem Relation Age of Onset  . Alzheimer's disease Father   . Melanoma Father   . Benign prostatic hyperplasia Father   . Coronary artery disease Father     s/p CABG  . Heart attack Brother   . Coronary artery disease Brother     s/p CABG  . Lymphoma Sister     non Hodgkin's   . Aortic aneurysm Mother   . Anemia Mother    Social History:  reports that he quit smoking about 4 months ago. His smoking use included Cigarettes. He has a 17.5 pack-year smoking history. He does not have any smokeless tobacco history on file. He reports that he does not drink alcohol or use illicit drugs. Allergies: No Known Allergies Medications Prior to Admission  Medication Dose Route Frequency Provider Last Rate Last Dose  . 0.9 %  sodium chloride infusion   Intravenous Once Arman Filter, NP 50 mL/hr at 10/15/11 0006    . 0.9 %  sodium chloride infusion   Intravenous Continuous Germaine Pomfret, MD 75 mL/hr at 11/01/11 2019    . 0.9 %  sodium chloride infusion   Intravenous Continuous Randa Evens  Lajoyce Corners, MD      . bd getting started take home kit 3/10 ml X 30g syringes 1 kit  1 kit Other Once Ripudeep Jenna Luo, MD   1 kit at 10/18/11 1700  . ceFAZolin (ANCEF) IVPB 1 g/50 mL premix  1 g Intravenous Q6H Nadara Mustard, MD   1 g at 11/02/11 0437  . chlorhexidine (HIBICLENS) 4 % liquid 4 application  60 mL Topical Once Nadara Mustard, MD   4 application at 11/01/11 [phone]  . diltiazem  (CARDIZEM) injection 10 mg  10 mg Intravenous Once Carlota Raspberry, MD   10 mg at 10/15/11 0447  . diltiazem (CARDIZEM) tablet 30 mg  30 mg Oral Q6H Eda Paschal [place], [place]   30 mg at 10/18/11 1729  . diphenhydrAMINE (BENADRYL) 12.5 MG/5ML elixir 12.5-25 mg  12.5-25 mg Oral Q4H PRN Nadara Mustard, MD      . docusate sodium (COLACE) capsule 100 mg  100 mg Oral BID Nadara Mustard, MD   100 mg at 11/02/11 2205  . feeding supplement (ENSURE IMMUNE HEALTH) liquid 237 mL  237 mL Oral BID BM Holly Anne Van Poots, RD      . gadobenate dimeglumine (MULTIHANCE) injection 15 mL  15 mL Intravenous Once PRN Medication Radiologist   15 mL at 10/21/11 1445  . gadobenate dimeglumine (MULTIHANCE) injection 20 mL  20 mL Intravenous Once PRN Medication Radiologist   20 mL at 10/15/11 0955  . heparin 100 units/mL bolus via infusion 2,500 Units  2,500 Units Intravenous Once Rolley Sims, PHARMD   2,500 Units at 10/15/11 1930  . heparin 100 units/mL bolus via infusion 4,000 Units  4,000 Units Intravenous Once Renaee Munda, PHARMD   4,000 Units at 10/15/11 1339  . heparin ADULT infusion 100 units/ml (25000 units/250 ml)  1,600 Units/hr Intravenous Continuous Ripudeep Jenna Luo, MD 16 mL/hr at 10/19/11 2114 1,600 Units/hr at 10/19/11 2114  . HYDROcodone-acetaminophen (NORCO) 5-325 MG per tablet 1-2 tablet  1-2 tablet Oral Q4H PRN Nadara Mustard, MD   2 tablet at 11/02/11 2330  . HYDROmorphone (DILAUDID) injection 0.5-1 mg  0.5-1 mg Intravenous Q2H PRN Nadara Mustard, MD   1 mg at 11/02/11 1444  . insulin aspart (novoLOG) injection 0-15 Units  0-15 Units Subcutaneous TID WC Nadara Mustard, MD   3 Units at 11/02/11 1849  . insulin aspart (novoLOG) injection 10 Units  10 Units Subcutaneous Once Arman Filter, NP   10 Units at 10/15/11 0255  . insulin aspart (novoLOG) injection 6 Units  6 Units Subcutaneous Once Wayne Wicklund   6 Units at 10/16/11 0005  . iohexol (OMNIPAQUE) 300 MG/ML injection 100 mL  100 mL Intravenous Once  PRN Medication Radiologist   100 mL at 10/15/11 2227  . living well with diabetes book MISC   Does not apply Once Kathlen Mody      . LORazepam (ATIVAN) tablet 1 mg  1 mg Oral Once Ripudeep K Rai, MD   1 mg at 10/23/11 1526  . magnesium citrate solution 1 Bottle  1 Bottle Oral Once PRN Nadara Mustard, MD      . methocarbamol (ROBAXIN) tablet 500 mg  500 mg Oral Q6H PRN Nadara Mustard, MD       Or  . methocarbamol (ROBAXIN) 500 mg in dextrose 5 % 50 mL IVPB  500 mg Intravenous Q6H PRN Nadara Mustard, MD      . methocarbamol (ROBAXIN) 500  mg in dextrose 5 % 50 mL IVPB  500 mg Intravenous To PACU Nadara Mustard, MD   500 mg at 11/01/11 1243  . metoCLOPramide (REGLAN) tablet 5-10 mg  5-10 mg Oral Q8H PRN Nadara Mustard, MD       Or  . metoCLOPramide (REGLAN) injection 5-10 mg  5-10 mg Intravenous Q8H PRN Nadara Mustard, MD      . metoprolol (LOPRESSOR) tablet 50 mg  50 mg Oral BID Nayana Abrol   50 mg at 11/02/11 2206  . ondansetron (ZOFRAN) injection 4 mg  4 mg Intravenous Once Arman Filter, NP   4 mg at 10/15/11 0126  . ondansetron (ZOFRAN) tablet 4 mg  4 mg Oral Q6H PRN Nadara Mustard, MD       Or  . ondansetron [facility]) injection 4 mg  4 mg Intravenous Q6H PRN Nadara Mustard, MD      . oxyCODONE-acetaminophen (PERCOCET) 5-325 MG per tablet 1-2 tablet  1-2 tablet Oral Q4H PRN Nadara Mustard, MD      . potassium chloride 10 mEq in 100 mL IVPB  10 mEq Intravenous Q1 Hr x 3 Ripudeep K Rai, MD   10 mEq at 10/21/11 1949  . potassium chloride SA (K-DUR,KLOR-CON) CR tablet 40 mEq  40 mEq Oral Once [employer], [place]   40 mEq at 10/19/11 1013  . potassium chloride SA (K-DUR,KLOR-CON) CR tablet 40 mEq  40 mEq Oral Once Ripudeep Jenna Luo, MD   40 mEq at 10/21/11 1642  . rivaroxaban (XARELTO) tablet 20 mg  20 mg Oral Q supper Nayana Abrol   20 mg at 11/02/11 1850  . rosuvastatin (CRESTOR) tablet 20 mg  20 mg Oral q1800 Nayana Abrol   20 mg at 11/02/11 2206  . sodium chloride 0.9 % bolus 500 mL  500 mL Intravenous Once  Sorin Laza   500 mL at 10/25/11 1637  . tigecycline (TYGACIL) 100 mg in sodium chloride 0.9 % 100 mL IVPB  100 mg Intravenous Once Acey Lav, MD   100 mg at 10/26/11 1803  . vancomycin (VANCOCIN) IVPB 1000 mg/200 mL premix  1,000 mg Intravenous Once Arman Filter, NP   1,000 mg at 10/15/11 0004  . DISCONTD: 0.9 %  sodium chloride infusion   Intravenous Continuous Arman Filter, NP 10 mL/hr at 10/15/11 0449 1,000 mL at 10/15/11 0449  . DISCONTD: 0.9 %  sodium chloride infusion   Intravenous Continuous Ripudeep Jenna Luo, MD 75 mL/hr at 10/23/11 1904    . DISCONTD: 0.9 %  sodium chloride infusion   Intravenous Continuous Sorin Laza 75 mL/hr at 10/26/11 2000    . DISCONTD: 0.9 % irrigation (POUR BTL)    PRN Nadara Mustard, MD   1,000 mL at 11/01/11 1113  . DISCONTD: acetaminophen (TYLENOL) suppository 650 mg  650 mg Rectal Q6H PRN Carlota Raspberry, MD      . DISCONTD: acetaminophen (TYLENOL) tablet 650 mg  650 mg Oral Q6H PRN Carlota Raspberry, MD   650 mg at 10/26/11 1554  . DISCONTD: aspirin tablet 325 mg  325 mg Oral Daily Vijaya Akula      . DISCONTD: aspirin tablet 325 mg  325 mg Oral Daily Vijaya Akula   325 mg at 10/26/11 1054  . DISCONTD: ciprofloxacin (CIPRO) IVPB 400 mg  400 mg Intravenous Q8H Acey Lav, MD   400 mg at 11/01/11 0445  . DISCONTD: clindamycin (CLEOCIN) IVPB 600 mg  600  mg Intravenous 60 min Pre-Op Nadara Mustard, MD      . DISCONTD: diltiazem (CARDIZEM LA) 24 hr tablet 120 mg  120 mg Oral Daily Lennette Bihari, MD   120 mg at 10/25/11 0944  . DISCONTD: diltiazem (CARDIZEM) 100 mg in dextrose 5 % 100 mL infusion  5-15 mg/hr Intravenous Titrated Vijaya Akula   10 mg/hr at 10/16/11 0309  . DISCONTD: docusate sodium (COLACE) capsule 100 mg  100 mg Oral BID Carlota Raspberry, MD   100 mg at 10/31/11 2231  . DISCONTD: feeding supplement (ENSURE IMMUNE HEALTH) liquid 237 mL  237 mL Oral BID BM Elyse Alexandra Shearer, RD   237 mL at 10/29/11 1600  . DISCONTD: heparin ADULT infusion 100 units/ml  (25000 units/250 ml)  1,450 Units/hr Intravenous Continuous Laurena Bering, PHARMD 14.5 mL/hr at 10/16/11 2259 1,450 Units/hr at 10/16/11 2259  . DISCONTD: heparin ADULT infusion 100 units/ml (25000 units/250 ml)  1,450 Units/hr Intravenous Continuous Corrie Mckusick Dean 14.5 mL/hr at 10/18/11 1213 1,450 Units/hr at 10/18/11 1213  . DISCONTD: heparin ADULT infusion 100 units/ml (25000 units/250 ml)  1,450 Units/hr Intravenous Continuous Severiano Gilbert, [place]   1,450 Units/hr at 10/23/11 1904  . DISCONTD: heparin injection 5,000 Units  5,000 Units Subcutaneous Q8H Carlota Raspberry, MD      . DISCONTD: HYDROmorphone (DILAUDID) injection 0.25-0.5 mg  0.25-0.5 mg Intravenous Q5 min PRN E. Jairo Ben, MD   0.5 mg at 10/17/11 2249  . DISCONTD: HYDROmorphone (DILAUDID) injection 0.25-0.5 mg  0.25-0.5 mg Intravenous Q5 min PRN E. Jairo Ben, MD   0.5 mg at 11/01/11 1318  . DISCONTD: HYDROmorphone (DILAUDID) injection 0.5 mg  0.5 mg Intravenous Q2H PRN Arman Filter, NP   0.5 mg at 10/15/11 0334  . DISCONTD: HYDROmorphone (DILAUDID) injection 1 mg  1 mg Intravenous Q4H PRN Carlota Raspberry, MD   1 mg at 10/25/11 2011  . DISCONTD: insulin aspart (novoLOG) injection 0-15 Units  0-15 Units Subcutaneous TID WC Vijaya Akula   15 Units at 10/16/11 5621  . DISCONTD: insulin aspart (novoLOG) injection 0-15 Units  0-15 Units Subcutaneous TID WC Vijaya Akula   2 Units at 10/31/11 1259  . DISCONTD: insulin aspart (novoLOG) injection 0-5 Units  0-5 Units Subcutaneous QHS Vijaya Akula   3 Units at 10/19/11 0046  . DISCONTD: insulin aspart (novoLOG) injection 10 Units  10 Units Subcutaneous TID WC Vijaya Akula   10 Units at 10/23/11 1226  . DISCONTD: insulin aspart (novoLOG) injection 5 Units  5 Units Subcutaneous TID WC Vijaya Akula   5 Units at 10/16/11 1722  . DISCONTD: insulin glargine (LANTUS) injection 10 Units  10 Units Subcutaneous Q0700 Vijaya Akula   10 Units at 10/16/11 1247  . DISCONTD: insulin glargine  (LANTUS) injection 25 Units  25 Units Subcutaneous Q0700 Vijaya Akula   25 Units at 10/19/11 3086  . DISCONTD: insulin glargine (LANTUS) injection 30 Units  30 Units Subcutaneous Q0700 Ripudeep Jenna Luo, MD   30 Units at 10/20/11 0626  . DISCONTD: insulin glargine (LANTUS) injection 30 Units  30 Units Subcutaneous Q0700 Ripudeep Jenna Luo, MD   30 Units at 10/31/11 0744  . DISCONTD: insulin glargine (LANTUS) injection 40 Units  40 Units Subcutaneous Q0700 Ripudeep Jenna Luo, MD   40 Units at 10/23/11 0703  . DISCONTD: insulin regular (HUMULIN R,NOVOLIN R) 1 Units/mL in sodium chloride 0.9 % 100 mL infusion   Intravenous Continuous Arman Filter, NP      .  DISCONTD: insulin regular (HUMULIN R,NOVOLIN R) 1 Units/mL in sodium chloride 0.9 % 100 mL infusion   Intravenous To Minor Judie Bonus Hammons, PHARMD      . DISCONTD: l-methylfolate-B6-B12 (METANX) 3-35-2 MG per tablet 1 tablet  1 tablet Oral BID Nadara Mustard, MD   1 tablet at 11/01/11 [phone]  . DISCONTD: lactated ringers infusion   Intravenous Continuous E. Jairo Ben, MD      . DISCONTD: LORazepam (ATIVAN) tablet 1 mg  1 mg Oral Q8H PRN Ripudeep Jenna Luo, MD   1 mg at 10/24/11 2214  . DISCONTD: metoprolol (LOPRESSOR) injection 5 mg  5 mg Intravenous Once Vijaya Akula      . DISCONTD: metoprolol (LOPRESSOR) tablet 50 mg  50 mg Oral BID Lennette Bihari, MD   50 mg at 10/25/11 0945  . DISCONTD: metoprolol (LOPRESSOR) tablet 50 mg  50 mg Oral BID Sorin Laza   50 mg at 10/31/11 1207  . DISCONTD: metoprolol tartrate (LOPRESSOR) tablet 12.5 mg  12.5 mg Oral BID Vijaya Akula   12.5 mg at 10/17/11 0928  . DISCONTD: metoprolol tartrate (LOPRESSOR) tablet 25 mg  25 mg Oral BID Eda Paschal [place], [place]      . DISCONTD: metroNIDAZOLE (FLAGYL) tablet 500 mg  500 mg Oral Q8H Acey Lav, MD   500 mg at 10/26/11 1426  . DISCONTD: nitroGLYCERIN (NITRODUR - Dosed in mg/24 hr) patch 0.6 mg  0.6 mg Transdermal Daily Nadara Mustard, MD   0.6 mg at 10/31/11 1222  . DISCONTD:  ondansetron (ZOFRAN) injection 4 mg  4 mg Intravenous Q6H PRN Carlota Raspberry, MD      . DISCONTD: ondansetron (ZOFRAN) tablet 4 mg  4 mg Oral Q6H PRN Carlota Raspberry, MD      . DISCONTD: oxyCODONE (Oxy IR/ROXICODONE) immediate release tablet 5 mg  5 mg Oral Q4H PRN Carlota Raspberry, MD   5 mg at 11/01/11 0620  . DISCONTD: pantoprazole (PROTONIX) EC tablet 40 mg  40 mg Oral Q1200 Abelino Derrick, PA   40 mg at 10/31/11 1259  . DISCONTD: piperacillin-tazobactam (ZOSYN) IVPB 3.375 g  3.375 g Intravenous Q8H Gary Fleet Abbott, PHARMD   3.375 g at 10/25/11 2130  . DISCONTD: polyethylene glycol (MIRALAX / GLYCOLAX) packet 17 g  17 g Oral Daily PRN Carlota Raspberry, MD      . DISCONTD: promethazine (PHENERGAN) injection 6.25-12.5 mg  6.25-12.5 mg Intravenous Q15 min PRN E. Jairo Ben, MD      . DISCONTD: promethazine (PHENERGAN) injection 6.25-12.5 mg  6.25-12.5 mg Intravenous Q15 min PRN E. Jairo Ben, MD      . DISCONTD: Rivaroxaban (XARELTO) tablet TABS 15 mg  15 mg Oral BID PC Nayana Abrol      . DISCONTD: rosuvastatin (CRESTOR) tablet 20 mg  20 mg Oral q1800 Vijaya Akula   20 mg at 10/31/11 1732  . DISCONTD: senna (SENOKOT) tablet 8.6 mg  1 tablet Oral BID Carlota Raspberry, MD   8.6 mg at 10/31/11 2231  . DISCONTD: silver sulfADIAZINE (SILVADENE) 1 % cream 1 application  1 application Topical Daily Nadara Mustard, MD   1 application at 10/31/11 1222  . DISCONTD: sodium chloride irrigation 0.9 %    PRN Corrie Mckusick Dean   3,000 mL at 10/17/11 2155  . DISCONTD: tigecycline (TYGACIL) 50 mg in sodium chloride 0.9 % 100 mL IVPB  50 mg Intravenous Q12H Acey Lav, MD   50 mg at 11/01/11 0556  . DISCONTD:  vancomycin (VANCOCIN) IVPB 1000 mg/200 mL premix  1,000 mg Intravenous Q12H Gary Fleet Abbott, PHARMD   1,000 mg at 10/23/11 1115  . DISCONTD: vancomycin (VANCOCIN) IVPB 1000 mg/200 mL premix  1,000 mg Intravenous Q12H Acey Lav, MD   1,000 mg at 10/26/11 1055   Medications Prior to Admission    Medication Sig Dispense Refill  . metFORMIN (GLUCOPHAGE) 500 MG tablet Take 500 mg by mouth 2 (two) times daily with a meal.        . HYDROcodone-acetaminophen (VICODIN) 5-500 MG per tablet Take 1 tablet by mouth every 6 (six) hours as needed for pain.  30 tablet  0  . oxyCODONE-acetaminophen (ROXICET) 5-325 MG per tablet Take 1 tablet by mouth every 4 (four) hours as needed for pain.  60 tablet  0  . warfarin (COUMADIN) 1 MG tablet Take 1 tablet (1 mg total) by mouth daily.  30 tablet  0    Home: Home Living Lives With: Family (Father and brother) Type of Home: House Home Layout: One level Home Access: Stairs to enter Entrance Stairs-Rails: Can reach both Entrance Stairs-Number of Steps: 2 Bathroom Shower/Tub: Engineer, manufacturing systems: Standard  Functional History: Prior Function Level of Independence: Independent with basic ADLs;Independent with homemaking with ambulation;Independent with gait;Independent with transfers Able to Take Stairs?: Yes Driving: Yes Vocation: Unemployed Comments: Pt became agitated with prior level of function questioning - saying "I was perfectly fine before this.  Did all the same things you can do!" Functional Status:  Mobility: Bed Mobility Bed Mobility: Yes Supine to Sit: 6: Modified independent (Device/Increase time) Sitting - Scoot to Edge of Bed: 5: Supervision Sitting - Scoot to Edge of Bed Details (indicate cue type and reason): Verbal cues for sequencing, efficiency.  Sit to Supine - Right: 6: Modified independent (Device/Increase time);HOB flat;With rail Sit to Supine - Right Details (indicate cue type and reason): Min A to mobilize bilateral LEs and organize lines and leads. Transfers Transfers: Yes Sit to Stand: From bed;With armrests;From elevated surface;4: Min assist;From chair/3-in-1 Sit to Stand Details (indicate cue type and reason): Verbal cues for maintaining Rt.LE NWB precautions. Verbal cues to utilize Bil. UE to push  from bed/armrests. performed 2x.  Stand to Sit: To bed;To elevated surface;To chair/3-in-1;4: Min assist (minguard for safety) Stand to Sit Details: Assist secondary to decreased ability to control descent. Verbal cues to use armrests (did not 1st attempt, did secondary attempt).  Stand Pivot Transfers: 4: Min assist Stand Pivot Transfer Details (indicate cue type and reason): Verbal cues for sequence, cues to negotiate RW and maintain RLE NWB precautions.  Ambulation/Gait Ambulation/Gait: No (pt unable currently. ) Ambulation/Gait Assistance: Other (comment) (minguard for safety) Ambulation/Gait Assistance Details (indicate cue type and reason): VCs for RW placment and keeping body within RW especially with turns.  Pt unsteady and impulsive at times with RW and overall movement.  Pt needs VCs for safety. Ambulation Distance (Feet): 60 Feet Assistive device: Rolling walker Gait Pattern: Step-to pattern;Decreased stance time - right;Antalgic;Shuffle Gait velocity: Decreased, not measured due to limited ambulation distance in room. Stairs: No Wheelchair Mobility Wheelchair Mobility: No  ADL:    Cognition: Cognition Arousal/Alertness: Awake/alert Orientation Level: Oriented X4 Cognition Arousal/Alertness: Awake/alert Overall Cognitive Status: Impaired Orientation Level: Oriented X4 Safety/Judgement: Decreased safety judgement for tasks assessed Decreased Safety/Judgement: Impulsive  Blood pressure 144/58, pulse 64, temperature 98.9 F (37.2 C), temperature source Oral, resp. rate 19, height 5\' 7"  (1.702 m), weight 82 kg (180 lb 12.4 oz), SpO2  100.00%. Physical Exam  Constitutional: He is oriented to person, place, and time. He appears well-developed.  HENT:  Head: Normocephalic.  Neck: Normal range of motion. Neck supple. No thyromegaly present.  Cardiovascular: Normal rate and regular rhythm.   Pulmonary/Chest: Breath sounds normal. He has no wheezes.  Abdominal: He exhibits no  distension. There is no tenderness.  Musculoskeletal: He exhibits no edema.  Neurological: He is alert and oriented to person, place, and time.  Skin:       Below knee amputation site dressed  Psychiatric: He has a normal mood and affect.   normal strength in bilateral upper extremities in left lower extremity.  Sensation is reduced in the left foot  Results for orders placed during the hospital encounter of 10/14/11 (from the past 24 hour(s))  GLUCOSE, CAPILLARY     Status: Abnormal   Collection Time   11/02/11  8:24 AM      Component Value Range   Glucose-Capillary 168 (*) 70 - 99 (mg/dL)  GLUCOSE, CAPILLARY     Status: Abnormal   Collection Time   11/02/11  1:27 PM      Component Value Range   Glucose-Capillary 144 (*) 70 - 99 (mg/dL)  GLUCOSE, CAPILLARY     Status: Abnormal   Collection Time   11/02/11  5:13 PM      Component Value Range   Glucose-Capillary 152 (*) 70 - 99 (mg/dL)  GLUCOSE, CAPILLARY     Status: Abnormal   Collection Time   11/02/11  9:39 PM      Component Value Range   Glucose-Capillary 177 (*) 70 - 99 (mg/dL)   Comment 1 Notify RN     Comment 2 Documented in Chart     No results found.  Assessment/Plan: Diagnosis: Right above knee amputation  1. Does the need for close, 24 hr/day medical supervision in concert with the patient's rehab needs make it unreasonable for this patient to be served in a less intensive setting? Potentially 2. Co-Morbidities requiring supervision/potential complications: Diabetic neuropathy, atrial fibrillation, coronary artery disease, hypertension 3. Due to bladder management, bowel management, safety, skin/wound care, medication administration, pain management and patient education, does the patient require 24 hr/day rehab nursing? Potentially 4. Does the patient require coordinated care of a physician, rehab nurse, PT (1-2 hrs/day, 5 days/week) and OT (1-2 hrs/day, 5 days/week) to address physical and functional deficits in  the context of the above medical diagnosis(es)? Potentially Addressing deficits in the following areas: balance, bathing, dressing, endurance, grooming, locomotion, strength, toileting and transferring 5. Can the patient actively participate in an intensive therapy program of at least 3 hrs of therapy per day at least 5 days per week? Potentially 6. The potential for patient to make measurable gains while on inpatient rehab is excellent 7. Anticipated functional outcomes upon discharge from inpatients are modified independent PT, modified independent ADLs OT, not applicable SLP 8. Estimated rehab length of stay to reach the above functional goals is: 1 week 9. Does the patient have adequate social supports to accommodate these discharge functional goals? Potentially 10. Anticipated D/C setting: Home 11. Anticipated post D/C treatments: Outpt therapy 12. Overall Rehab/Functional Prognosis: excellent  RECOMMENDATIONS: This patient's condition is appropriate for continued rehabilitative care in the following setting: Patient is already and min assist level. Rehabilitation nursing followup in 3 days anticipate that he'll be ready for discharge to home with some home health by that time Patient has agreed to participate in recommended program. Potentially Note that insurance  prior authorization may be required for reimbursement for recommended care.  Comment:   ANGIULLI,DANIEL J. 11/03/2011

## 2011-11-03 NOTE — Progress Notes (Signed)
Utilization Review Completed.  [name]  11/03/2011 

## 2011-11-04 LAB — BASIC METABOLIC PANEL
CO2: 31 mEq/L (ref 19–32)
Chloride: 99 mEq/L (ref 96–112)
Glucose, Bld: 177 mg/dL — ABNORMAL HIGH (ref 70–99)
Potassium: 3.8 mEq/L (ref 3.5–5.1)
Sodium: 136 mEq/L (ref 135–145)

## 2011-11-04 LAB — GLUCOSE, CAPILLARY
Glucose-Capillary: 187 mg/dL — ABNORMAL HIGH (ref 70–99)
Glucose-Capillary: 239 mg/dL — ABNORMAL HIGH (ref 70–99)
Glucose-Capillary: 163 mg/dL — ABNORMAL HIGH (ref 70–99)

## 2011-11-04 MED ORDER — OXYCODONE HCL 10 MG PO TB12
10.0000 mg | ORAL_TABLET | Freq: Every day | ORAL | Status: DC
  Administered 2011-11-04: 23:00:00 10 mg via ORAL
  Filled 2011-11-04 (×2): qty 1

## 2011-11-04 NOTE — Progress Notes (Signed)
Physical Therapy Treatment Patient Details Name: Treyvon Blahut MRN: [id number] DOB: 01-25-1956 Today's Date: 11/04/2011  PT Assessment/Plan  PT - Assessment/Plan Comments on Treatment Session: Today's session limited by pain RLE. Encouraged patient to perform quad sets on RLE for knee extension.  Slow progress continues PT Plan: Discharge plan remains appropriate;Frequency remains appropriate PT Frequency: Min 3X/week Follow Up Recommendations: Skilled nursing facility Equipment Recommended: Defer to next venue PT Goals  Acute Rehab PT Goals PT Goal: Supine/Side to Sit - Progress: Progressing toward goal PT Goal: Sit to Stand - Progress: Progressing toward goal PT Goal: Stand to Sit - Progress: Progressing toward goal PT Transfer Goal: Bed to Chair/Chair to Bed - Progress: Progressing toward goal  PT Treatment Precautions/Restrictions  Precautions Precautions: Fall Required Braces or Orthoses: No Restrictions Weight Bearing Restrictions: No RLE Weight Bearing: Non weight bearing Mobility (including Balance) Bed Mobility Bed Mobility: Yes Supine to Sit: 4: Min assist;With rails;HOB flat Supine to Sit Details (indicate cue type and reason): Cues to use rail and for technique Sitting - Scoot to Edge of Bed: 4: Min assist Sitting - Scoot to [place] of Bed Details (indicate cue type and reason): Cues to move one hip forward at a time. Transfers Transfers: Yes Sit to Stand: 3: Mod assist;With upper extremity assist;From bed Sit to Stand Details (indicate cue type and reason): Cues to scoot to edge of bed and shift weight forward before standing.  Cues for safe hand placement Stand to Sit: 4: Min assist;With upper extremity assist;With armrests;To chair/3-in-1 Stand to Sit Details: Cues to reach back for chair before standing.  Assist for slow descent to chair. Stand Pivot Transfers: 3: Mod assist;With armrests Stand Pivot Transfer Details (indicate cue type and reason): Cues for safe  use of RW and technique Ambulation/Gait Ambulation/Gait: No (Patient refused due to pain in RLE)    Exercise  General Exercises - Lower Extremity Ankle Circles/Pumps: AROM;Left;10 reps;Seated Quad Sets: AROM;Right;5 reps;Seated End of Session PT - End of Session Equipment Utilized During Treatment: Gait belt Activity Tolerance: Patient limited by pain;Patient limited by fatigue Patient left: in chair;with call bell in reach Nurse Communication: Mobility status for transfers General Behavior During Session: [facility] for tasks performed Cognition: [facility] for tasks performed  Vena Austria [phone] 11/04/2011, 2:46 PM

## 2011-11-04 NOTE — Plan of Care (Signed)
Problem: Phase III Progression Outcomes Goal: Activity at appropriate level-compared to baseline (UP IN CHAIR FOR HEMODIALYSIS)  Outcome: Progressing Progressing slowly.  Unable to ambulate due to pain.

## 2011-11-04 NOTE — Progress Notes (Signed)
Subjective:  minimal pain.     Objective: Vital signs in last 24 hours: Temp:  [98.1 F (36.7 C)-98.8 F (37.1 C)] 98.3 F (36.8 C) (12/22 0730) Pulse Rate:  [57-69] 67  (12/22 0730) Resp:  [14-26] 20  (12/22 0730) BP: (127-145)/(44-59) 131/56 mmHg (12/22 0730) SpO2:  [90 %-100 %] 95 % (12/22 0730) Weight:  [76.8 kg (169 lb 5 oz)] 169 lb 5 oz (76.8 kg) (12/22 0600)  Intake/Output from previous day: 12/21 0701 - 12/22 0700 In: 300 [P.O.:300] Out: 802 [Urine:800; Stool:2] Intake/Output this shift: Total I/O In: -  Out: 250 [Urine:250]   Basename 11/02/11 0515 11/01/11 1518  HGB 9.0* 9.1*    Basename 11/02/11 0515 11/01/11 1518  WBC 7.2 7.3  RBC 3.43* 3.50*  HCT 28.5* 29.0*  PLT 148* 128*    Basename 11/04/11 0700 11/03/11 0455  NA 136 134*  K 3.8 4.6  CL 99 99  CO2 31 32  BUN 12 12  CREATININE 0.90 0.98  GLUCOSE 177* 163*  CALCIUM 8.2* 8.3*    Basename 11/01/11 1518  LABPT --  INR 1.18    dressing from amputation is dry and intact.   Assessment/Plan: Likely SNF needed.    Danica Camarena C 11/04/2011, 11:31 AM

## 2011-11-04 NOTE — Progress Notes (Signed)
Subjective: Improved Pain Control, Participated with Physical Therapy Yesterday  Objective: Vital signs in last 24 hours: Filed Vitals:   11/04/11 0325 11/04/11 0600 11/04/11 0730 11/04/11 1100  BP: 130/52  131/56 121/51  Pulse: 69  67 61  Temp: 98.4 F (36.9 C)  98.3 F (36.8 C) 98 F (36.7 C)  TempSrc: Oral  Oral Oral  Resp: 18  20 19   Height:      Weight:  76.8 kg (169 lb 5 oz)    SpO2: 90%  95% 96%    Intake/Output Summary (Last 24 hours) at 11/04/11 1243 Last data filed at 11/04/11 0900  Gross per 24 hour  Intake    100 ml  Output   1052 ml  Net   -952 ml    Weight change:    General: Alert, awake, oriented x3, in no acute distress. HEENT: No bruits, no goiter. Heart: Regular rate and rhythm, without murmurs, rubs, gallops. Lungs: Clear to auscultation bilaterally. Abdomen: Soft, nontender, nondistended, positive bowel sounds. Extremities: Right BKA  Neuro: Grossly intact, nonfocal.   Lab Results: Results for orders placed during the hospital encounter of 10/14/11 (from the past 24 hour(s))  GLUCOSE, CAPILLARY     Status: Abnormal   Collection Time   11/03/11  4:10 PM      Component Value Range   Glucose-Capillary 292 (*) 70 - 99 (mg/dL)   Comment 1 Documented in Chart     Comment 2 Notify RN    GLUCOSE, CAPILLARY     Status: Abnormal   Collection Time   11/03/11  9:22 PM      Component Value Range   Glucose-Capillary 217 (*) 70 - 99 (mg/dL)  BASIC METABOLIC PANEL     Status: Abnormal   Collection Time   11/04/11  7:00 AM      Component Value Range   Sodium 136  135 - 145 (mEq/L)   Potassium 3.8  3.5 - 5.1 (mEq/L)   Chloride 99  96 - 112 (mEq/L)   CO2 31  19 - 32 (mEq/L)   Glucose, Bld 177 (*) 70 - 99 (mg/dL)   BUN 12  6 - 23 (mg/dL)   Creatinine, Ser 4.09  0.50 - 1.35 (mg/dL)   Calcium 8.2 (*) 8.4 - 10.5 (mg/dL)   GFR calc non Af Amer >90  >90 (mL/min)   GFR calc Af Amer >90  >90 (mL/min)  GLUCOSE, CAPILLARY     Status: Abnormal   Collection  Time   11/04/11  7:30 AM      Component Value Range   Glucose-Capillary 187 (*) 70 - 99 (mg/dL)   Comment 1 Notify RN     Comment 2 Documented in Chart    GLUCOSE, CAPILLARY     Status: Abnormal   Collection Time   11/04/11 12:12 PM      Component Value Range   Glucose-Capillary 195 (*) 70 - 99 (mg/dL)   Comment 1 Documented in Chart     Comment 2 Notify RN       Micro: Recent Results (from the past 240 hour(s))  CULTURE, BLOOD (ROUTINE X 2)     Status: Normal   Collection Time   10/25/11  6:20 PM      Component Value Range Status Comment   Specimen Description BLOOD RIGHT ARM   Final    Special Requests BOTTLES DRAWN AEROBIC AND ANAEROBIC 10CC   Final    Setup Time [id number]   Final  Culture NO GROWTH 5 DAYS   Final    Report Status 11/01/2011 FINAL   Final   CULTURE, BLOOD (ROUTINE X 2)     Status: Normal   Collection Time   10/25/11  6:35 PM      Component Value Range Status Comment   Specimen Description BLOOD LEFT HAND   Final    Special Requests BOTTLES DRAWN AEROBIC AND ANAEROBIC 10CC   Final    Setup Time [id number]   Final    Culture NO GROWTH 5 DAYS   Final    Report Status 11/01/2011 FINAL   Final     Studies/Results: No results found.  Medications:     . docusate sodium  100 mg Oral BID  . feeding supplement  237 mL Oral BID BM  . insulin aspart  0-15 Units Subcutaneous TID WC  . metoprolol tartrate  50 mg Oral BID  . oxyCODONE  10 mg Oral Q12H  . rivaroxaban  20 mg Oral Q supper  . rosuvastatin  20 mg Oral q1800     Assessment:   Severe thrombocytopenia - unclear cause, resolved. Ruled out DIC. ? Due to beta lactam abx.  - Hit panel back, per Dr. Clelia Croft likely falsely positive, antibiotics likely to be blamed,. 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 Wound cultures growing pseudomonas, abscess culture on 10/17/2011  shows Klebsiella oxytoca and enterococcus  - Dr. Aldean Baker following, status post BKA , his antibiotics have been discontinued postoperatively.   CAD (coronary artery disease), autologous vein bypass graft / s/p 2 stent placement: went into atrial flutter this admission and was found to have elevated troponins, was treated for NSTEMI/ Demand ischemia with IV heparin and IV Cardizem, now in NSR.  - Continue with metoprolol/ crestor. All in aspirin secondary to recent episode of thrombocytopenia   Atrial flutter with NSTEMI/demand ischemia: Followed by cardiology  - Continue metoprolol  - Heparin drip dc'ed . He needs primary stroke prophylaxis per cards.  - DCCV outpatient per Dr. Blanchie Dessert recommendations. And started on xarelto   Hypertension controlled  Diabetes Mellitus: Controlled, Continue insulin at current regimen, Continue carb modified diet   Pain control was started on OxyContin yesterday. Pain control has improved. He would need skilled nursing facility   LOS: 21 days   [facility] 11/04/2011, 12:43 PM

## 2011-11-05 LAB — CBC
HCT: 25.7 % — ABNORMAL LOW (ref 39.0–52.0)
MCHC: 31.5 g/dL (ref 30.0–36.0)
MCV: 82.6 fL (ref 78.0–100.0)
RDW: 16.4 % — ABNORMAL HIGH (ref 11.5–15.5)

## 2011-11-05 LAB — GLUCOSE, CAPILLARY: Glucose-Capillary: 201 mg/dL — ABNORMAL HIGH (ref 70–99)

## 2011-11-05 MED ORDER — OXYCODONE HCL 15 MG PO TB12
15.0000 mg | ORAL_TABLET | Freq: Two times a day (BID) | ORAL | Status: DC
  Administered 2011-11-05 – 2011-11-10 (×10): 15 mg via ORAL
  Filled 2011-11-05: qty 3
  Filled 2011-11-05 (×9): qty 1

## 2011-11-05 MED ORDER — WHITE PETROLATUM GEL
Status: AC
  Administered 2011-11-05: 16:00:00
  Filled 2011-11-05: qty 5

## 2011-11-05 NOTE — Progress Notes (Signed)
Subjective: Pain improved but not optimally controlled yet  Objective: Vital signs in last 24 hours: Filed Vitals:   11/04/11 1600 11/04/11 2100 11/05/11 0025 11/05/11 0803  BP: 129/54 130/64 113/53 117/52  Pulse: 59 71 68 69  Temp: 98.2 F (36.8 C) 98.4 F (36.9 C) 97.9 F (36.6 C) 96.7 F (35.9 C)  TempSrc: Oral Oral Oral Oral  Resp: 15 13 17 21   Height:      Weight:      SpO2: 98% 95% 96% 95%    Intake/Output Summary (Last 24 hours) at 11/05/11 1057 Last data filed at 11/05/11 0700  Gross per 24 hour  Intake     60 ml  Output   1125 ml  Net  -1065 ml    Weight change:  General: Alert, awake, oriented x3, in no acute distress.  HEENT: No bruits, no goiter.  Heart: Regular rate and rhythm, without murmurs, rubs, gallops.  Lungs: Clear to auscultation bilaterally.  Abdomen: Soft, nontender, nondistended, positive bowel sounds.  Extremities: Right BKA  Neuro: Grossly intact, nonfocal.    Lab Results: Results for orders placed during the hospital encounter of 10/14/11 (from the past 24 hour(s))  GLUCOSE, CAPILLARY     Status: Abnormal   Collection Time   11/04/11 12:12 PM      Component Value Range   Glucose-Capillary 195 (*) 70 - 99 (mg/dL)   Comment 1 Documented in Chart     Comment 2 Notify RN    GLUCOSE, CAPILLARY     Status: Abnormal   Collection Time   11/04/11  4:21 PM      Component Value Range   Glucose-Capillary 239 (*) 70 - 99 (mg/dL)   Comment 1 Documented in Chart     Comment 2 Notify RN    GLUCOSE, CAPILLARY     Status: Abnormal   Collection Time   11/04/11  9:59 PM      Component Value Range   Glucose-Capillary 163 (*) 70 - 99 (mg/dL)  GLUCOSE, CAPILLARY     Status: Abnormal   Collection Time   11/05/11  8:11 AM      Component Value Range   Glucose-Capillary 161 (*) 70 - 99 (mg/dL)   Comment 1 Documented in Chart     Comment 2 Notify RN       Micro: No results found for this or any previous visit (from the past 240  hour(s)).  Studies/Results: No results found.  Medications:     . docusate sodium  100 mg Oral BID  . feeding supplement  237 mL Oral BID BM  . insulin aspart  0-15 Units Subcutaneous TID WC  . metoprolol tartrate  50 mg Oral BID  . oxyCODONE  10 mg Oral Q12H  . oxyCODONE  10 mg Oral QHS  . rivaroxaban  20 mg Oral Q supper  . rosuvastatin  20 mg Oral q1800     Assessment:   Severe thrombocytopenia - unclear cause, resolved now. Ruled out DIC. ? Due to beta lactam abx.  - Hit panel back, per Dr. Clelia Croft likely falsely positive, antibiotics likely to be blamed,. 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 Wound cultures growing pseudomonas, abscess culture on 10/17/2011 shows Klebsiella oxytoca and enterococcus  - Dr. Aldean Baker following, status post BKA , his antibiotics have been discontinued postoperatively.   CAD (coronary artery  disease), autologous vein bypass graft / s/p 2 stent placement: went into atrial flutter this admission and was found to have elevated troponins, was treated for NSTEMI/ Demand ischemia with IV heparin and IV Cardizem, now in NSR. DC telemetry monitoring.  - Continue with metoprolol/ crestor. All in aspirin secondary to recent episode of thrombocytopenia    Atrial flutter with NSTEMI/demand ischemia: Followed by cardiology  - Continue metoprolol  - Heparin drip dc'ed . He needs primary stroke prophylaxis per cards.  - DCCV outpatient per Dr. Blanchie Dessert recommendations. And started on xarelto   Hypertension controlled   Diabetes Mellitus: Controlled, Continue insulin at current regimen, Continue carb modified diet  Pain control was started on OxyContin  . Pain control has improved. He would need skilled nursing facility, next week.     LOS: 22 days   [name] 11/05/2011, 10:57 AM

## 2011-11-06 LAB — GLUCOSE, CAPILLARY
Glucose-Capillary: 162 mg/dL — ABNORMAL HIGH (ref 70–99)
Glucose-Capillary: 238 mg/dL — ABNORMAL HIGH (ref 70–99)

## 2011-11-06 MED ORDER — ALPRAZOLAM 0.5 MG PO TABS
0.5000 mg | ORAL_TABLET | Freq: Two times a day (BID) | ORAL | Status: DC | PRN
  Administered 2011-11-06: 0.5 mg via ORAL
  Filled 2011-11-06: qty 1

## 2011-11-06 NOTE — Progress Notes (Signed)
Nutrition Follow-up  Diet Order:  Carbohydrate Modified Medium Calorie. PO intake 95-100% per flowsheet records. Ensure Immune Health PO BID.  Meds: Scheduled Meds:   . docusate sodium  100 mg Oral BID  . feeding supplement  237 mL Oral BID BM  . insulin aspart  0-15 Units Subcutaneous TID WC  . metoprolol tartrate  50 mg Oral BID  . oxyCODONE  15 mg Oral Q12H  . rivaroxaban  20 mg Oral Q supper  . rosuvastatin  20 mg Oral q1800  . white petrolatum      . DISCONTD: oxyCODONE  10 mg Oral Q12H  . DISCONTD: oxyCODONE  10 mg Oral QHS   Continuous Infusions:   . sodium chloride     PRN Meds:.diphenhydrAMINE, HYDROcodone-acetaminophen, methocarbamol(ROBAXIN) IV, methocarbamol, metoCLOPramide (REGLAN) injection, metoCLOPramide, ondansetron (ZOFRAN) IV, ondansetron, DISCONTD: HYDROmorphone, DISCONTD: oxyCODONE-acetaminophen  Labs:  CMP     Component Value Date/Time   NA 136 11/04/2011 0700   K 3.8 11/04/2011 0700   CL 99 11/04/2011 0700   CO2 31 11/04/2011 0700   GLUCOSE 177* 11/04/2011 0700   BUN 12 11/04/2011 0700   CREATININE 0.90 11/04/2011 0700   CALCIUM 8.2* 11/04/2011 0700   PROT 6.7 10/23/2011 0540   ALBUMIN 1.8* 10/23/2011 0540   AST 30 10/23/2011 0540   ALT 15 10/23/2011 0540   ALKPHOS 64 10/23/2011 0540   BILITOT 0.3 10/23/2011 0540   GFRNONAA >90 11/04/2011 0700   GFRAA >90 11/04/2011 0700     Intake/Output Summary (Last 24 hours) at 11/06/11 1014 Last data filed at 11/06/11 0519  Gross per 24 hour  Intake      0 ml  Output    900 ml  Net   -900 ml    Weight Status:  76.8 kg (12/22) -- down, s/p BKA  Nutrition Dx:  Malnutrition, ongoing  Goal:  Meet 90-100% of estimated nutrition needs with meals and supplements, met. Monitor: PO intake, labs, weight, I/O's  Intervention:    Continue Ensure Immune Health PO BID  RD to follow for nutrition care plan   Alger Memos Pager #:  [phone]

## 2011-11-06 NOTE — Progress Notes (Signed)
Patient ID: [name], male   DOB: 02/24/1956, 55 y.o.   MRN: [id number] Patient doing better, will need in patient rehab vs SNF placement.

## 2011-11-06 NOTE — Progress Notes (Signed)
Subjective: Pain is improved  Objective: Vital signs in last 24 hours: Filed Vitals:   11/05/11 0803 11/05/11 1500 11/05/11 2048 11/06/11 0518  BP: 117/52 120/62 118/62 126/70  Pulse: 69 72 65 62  Temp: 96.7 F (35.9 C) 98.4 F (36.9 C) 98.6 F (37 C) 98.4 F (36.9 C)  TempSrc: Oral Oral Oral Oral  Resp: 21 18 18 18   Height:      Weight:      SpO2: 95% 96% 95% 92%    Intake/Output Summary (Last 24 hours) at 11/06/11 1142 Last data filed at 11/06/11 0519  Gross per 24 hour  Intake      0 ml  Output    900 ml  Net   -900 ml    Weight change:   Weight change:  General: Alert, awake, oriented x3, in no acute distress.  HEENT: No bruits, no goiter.  Heart: Regular rate and rhythm, without murmurs, rubs, gallops.  Lungs: Clear to auscultation bilaterally.  Abdomen: Soft, nontender, nondistended, positive bowel sounds.  Extremities: Right BKA  Neuro: Grossly intact, nonfocal.   Lab Results: Results for orders placed during the hospital encounter of 10/14/11 (from the past 24 hour(s))  CBC     Status: Abnormal   Collection Time   11/05/11 12:20 PM      Component Value Range   WBC 4.6  4.0 - 10.5 (K/uL)   RBC 3.11 (*) 4.22 - 5.81 (MIL/uL)   Hemoglobin 8.1 (*) 13.0 - 17.0 (g/dL)   HCT 16.1 (*) 09.6 - 52.0 (%)   MCV 82.6  78.0 - 100.0 (fL)   MCH 26.0  26.0 - 34.0 (pg)   MCHC 31.5  30.0 - 36.0 (g/dL)   RDW 04.5 (*) 40.9 - 15.5 (%)   Platelets 122 (*) 150 - 400 (K/uL)  GLUCOSE, CAPILLARY     Status: Abnormal   Collection Time   11/05/11 12:48 PM      Component Value Range   Glucose-Capillary 251 (*) 70 - 99 (mg/dL)   Comment 1 Documented in Chart     Comment 2 Notify RN    GLUCOSE, CAPILLARY     Status: Abnormal   Collection Time   11/05/11  4:34 PM      Component Value Range   Glucose-Capillary 123 (*) 70 - 99 (mg/dL)   Comment 1 Documented in Chart     Comment 2 Notify RN    GLUCOSE, CAPILLARY     Status: Abnormal   Collection Time   11/05/11  9:55 PM    Component Value Range   Glucose-Capillary 201 (*) 70 - 99 (mg/dL)   Comment 1 Documented in Chart     Comment 2 Notify RN    GLUCOSE, CAPILLARY     Status: Abnormal   Collection Time   11/06/11  6:53 AM      Component Value Range   Glucose-Capillary 162 (*) 70 - 99 (mg/dL)  GLUCOSE, CAPILLARY     Status: Abnormal   Collection Time   11/06/11 11:24 AM      Component Value Range   Glucose-Capillary 258 (*) 70 - 99 (mg/dL)   Comment 1 Notify RN     Comment 2 Documented in Chart       Micro: No results found for this or any previous visit (from the past 240 hour(s)).  Studies/Results: No results found.  Medications:     . docusate sodium  100 mg Oral BID  . feeding supplement  237 mL  Oral BID BM  . insulin aspart  0-15 Units Subcutaneous TID WC  . metoprolol tartrate  50 mg Oral BID  . oxyCODONE  15 mg Oral Q12H  . rivaroxaban  20 mg Oral Q supper  . rosuvastatin  20 mg Oral q1800  . white petrolatum         Assessment:   Severe thrombocytopenia - unclear cause, resolved now. Ruled out DIC. ? Due to beta lactam abx.  - Hit panel back, per Dr. Clelia Croft likely falsely positive, antibiotics likely to be blamed,. 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 Wound cultures growing pseudomonas, abscess culture on 10/17/2011 shows Klebsiella oxytoca and enterococcus  - Dr. Aldean Baker following, status post BKA , his antibiotics have been discontinued postoperatively.  Decision about SNF versus home health needs to be made.  CAD (coronary artery disease), autologous vein bypass graft / s/p 2 stent placement: went into atrial flutter this admission and was found to have elevated troponins, was treated for NSTEMI/ Demand ischemia with IV heparin and IV Cardizem, now in NSR. DC telemetry monitoring.  - Continue with metoprolol/ crestor. All in aspirin secondary to  recent episode of thrombocytopenia  Atrial flutter with NSTEMI/demand ischemia: Followed by cardiology  - Continue metoprolol  - Heparin drip dc'ed . He needs primary stroke prophylaxis per cards.  - DCCV outpatient per Dr. Blanchie Dessert recommendations. And started on xarelto  Hypertension controlled  Diabetes Mellitus: Controlled, Continue insulin at current regimen, Continue carb modified diet  Pain control was started on OxyContin . Pain control has improved. He would need skilled nursing facility, next week.    Disposition SNF versus home health.   LOS: 23 days   Ileen Kahre 11/06/2011, 11:42 AM

## 2011-11-06 NOTE — Progress Notes (Signed)
I met with patient at bedside. He has just worked with therapy. He reports that his Dad has been committed to a facility by his brother. His brother is at home but can offer no assistance. Dr. Wynn Banker saw patient Friday and felt once patient's pain less, he will likely progress well enough to be able to go home with home health. Patient wants to see how he does over the next couple of days to see how he does before making any decisions. He is concerned about finances for he is a self pay and inpatient acute rehabilitation costs more than SNF rehab or home health. He also states he takes xanax at home and feels he needs it here. I encouraged him to discuss with his physician. We will follow up Wednesday. Please call for any questions. Pager [phone].

## 2011-11-06 NOTE — Progress Notes (Signed)
Pt is alert and oriented x 3. Pt noted to be forgetful at times but follows commands. 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 Pt has a dry ace coban dressing of R BKA. Pt keeps R BKA elevated. Pt voids quantity sufficient without difficulty. Pt repts an episode of nausea yesterday that was relieved with IV medication. Pt denies any further nausea. Pt repts LBM was yesterday. Pt abdomen is soft flat nontender and nondistended. Pt repts passing gas and denies  vomiting. Pt is receiving ensure chocolate liquid per order due to intermittent poor PO intake. BS+x4. Pt lungs CTA but noted to be diminished in the bases. No s/sx cardiac or resp distress and no c/o such. Pt sats 92% RA. Pt performs IS per order. Pt lips noted to be cracked and dried. Skin of lips in places are dry and scabbed. Applying petroleum jelly to lips for moisture. Pt noted to have excoriated skin of upper bil buttocks. Applying EPC cream to bil buttock.  Pt is continent and pt denies recent diarrhea episodes. Pt encouraged to turn every two hours. Pt is noncompliant with turning. Pt L heel has no pressure skin issues. Attempting to keep L heel elevated to prevent skin breakdown. Pt noted to have a black hard circular area of the bottom of the L foot below big toe. Area had a mepilex that had peeled off so new mepilex applied. Area is not draining or open to air.

## 2011-11-06 NOTE — Progress Notes (Signed)
Physical Therapy Treatment Patient Details Name: [name] MRN: [id number] DOB: 1956-08-27 Today's Date: 11/06/2011  PT Assessment/Plan  PT - Assessment/Plan Comments on Treatment Session: Pt slowly progressing, but was able to ambulate today for the first time s/p BKA. Session still limited by pain, will attempt increased ambulation distance next session. PT Plan: Discharge plan remains appropriate;Frequency remains appropriate PT Frequency: Min 3X/week Follow Up Recommendations: Skilled nursing facility Equipment Recommended: Defer to next venue PT Goals  Acute Rehab PT Goals PT Goal Formulation: With patient PT Goal: Supine/Side to Sit - Progress: Progressing toward goal PT Goal: Sit to Stand - Progress: Progressing toward goal PT Goal: Stand to Sit - Progress: Progressing toward goal PT Transfer Goal: Bed to Chair/Chair to Bed - Progress: Progressing toward goal PT Goal: Ambulate - Progress: Progressing toward goal PT Goal: Up/Down Stairs - Progress: Other (comment) (NT today) PT Goal: Perform Home Exercise Program - Progress: Progressing toward goal PT Goal: Propel Wheelchair - Progress: Other (comment) (NT today)  PT Treatment Precautions/Restrictions  Precautions Precautions: Fall Required Braces or Orthoses: No Restrictions Weight Bearing Restrictions: No RLE Weight Bearing: Non weight bearing Mobility (including Balance) Bed Mobility Bed Mobility: Yes Supine to Sit: 5: Supervision;HOB elevated (Comment degrees);With rails (20) Supine to Sit Details (indicate cue type and reason): VC for sequencing. Pt able to lift Right residual limb without assist Sitting - Scoot to Edge of Bed: 5: Supervision Sitting - Scoot to Edge of Bed Details (indicate cue type and reason): VC for hand placement and weight shifting to scoot Sit to Supine - Right: Not Tested (comment) Transfers Transfers: Yes Sit to Stand: 4: Min assist;With upper extremity assist;From bed Sit to Stand  Details (indicate cue type and reason): VC for hand placement. Assist to maintain stability into standing. Stand to Sit: 4: Min assist;With upper extremity assist;With armrests;To chair/3-in-1 Stand to Sit Details: VC for hand placement. Pt able to maintain controlled balance with minimal assist for stability Ambulation/Gait Ambulation/Gait: Yes Ambulation/Gait Assistance: 3: Mod assist Ambulation/Gait Assistance Details (indicate cue type and reason): Mod assist to maintain balance ambulation. VC for distance to RW for safety. Pt able to balance with hopping Ambulation Distance (Feet): 5 Feet Assistive device: Rolling walker Gait Pattern: Step-to pattern;Decreased step length - left;Trunk flexed (hopping) Gait velocity: Decreased, not measured due to limited ambulation distance in room. Stairs: No    Exercise  General Exercises - Lower Extremity Quad Sets: AROM;Right;10 reps;Other (comment) (long sitting) Straight Leg Raises: AROM;Strengthening;Right;10 reps;Other (comment) (long sitting; combined with quad sets) End of Session PT - End of Session Equipment Utilized During Treatment: Gait belt Activity Tolerance: Patient limited by pain;Patient limited by fatigue Patient left: in chair;with call bell in reach Nurse Communication: Mobility status for transfers;Mobility status for ambulation General Behavior During Session: [facility] for tasks performed Cognition: [facility] for tasks performed  Milana Kidney 11/06/2011, 11:27 AM  11/06/2011 Milana Kidney DPT PAGER: [phone] OFFICE: [phone]

## 2011-11-07 LAB — GLUCOSE, CAPILLARY
Glucose-Capillary: 158 mg/dL — ABNORMAL HIGH (ref 70–99)
Glucose-Capillary: 212 mg/dL — ABNORMAL HIGH (ref 70–99)
Glucose-Capillary: 257 mg/dL — ABNORMAL HIGH (ref 70–99)

## 2011-11-07 NOTE — Progress Notes (Signed)
Subjective: No complaints  Objective: Vital signs in last 24 hours: Filed Vitals:   11/06/11 0518 11/06/11 1400 11/06/11 2122 11/07/11 0602  BP: 126/70 136/73 119/49 121/68  Pulse: 62  58 57  Temp: 98.4 F (36.9 C) 98.4 F (36.9 C) 98.6 F (37 C) 98.6 F (37 C)  TempSrc: Oral   Oral  Resp: 18 62 18 20  Height:      Weight:      SpO2: 92% 95% 93% 96%    Intake/Output Summary (Last 24 hours) at 11/07/11 0929 Last data filed at 11/06/11 2123  Gross per 24 hour  Intake    240 ml  Output    400 ml  Net   -160 ml    Weight change:  General: Alert, awake, oriented x3, in no acute distress.  HEENT: No bruits, no goiter.  Heart: Regular rate and rhythm, without murmurs, rubs, gallops.  Lungs: Clear to auscultation bilaterally.  Abdomen: Soft, nontender, nondistended, positive bowel sounds.  Extremities: Right BKA  Neuro: Grossly intact, nonfocal.    Lab Results: Results for orders placed during the hospital encounter of 10/14/11 (from the past 24 hour(s))  GLUCOSE, CAPILLARY     Status: Abnormal   Collection Time   11/06/11 11:24 AM      Component Value Range   Glucose-Capillary 258 (*) 70 - 99 (mg/dL)   Comment 1 Notify RN     Comment 2 Documented in Chart    GLUCOSE, CAPILLARY     Status: Abnormal   Collection Time   11/06/11  5:31 PM      Component Value Range   Glucose-Capillary 220 (*) 70 - 99 (mg/dL)   Comment 1 Documented in Chart     Comment 2 Notify RN    GLUCOSE, CAPILLARY     Status: Abnormal   Collection Time   11/06/11  9:18 PM      Component Value Range   Glucose-Capillary 238 (*) 70 - 99 (mg/dL)   Comment 1 Documented in Chart     Comment 2 Notify RN    GLUCOSE, CAPILLARY     Status: Abnormal   Collection Time   11/07/11  6:22 AM      Component Value Range   Glucose-Capillary 158 (*) 70 - 99 (mg/dL)     Micro: No results found for this or any previous visit (from the past 240 hour(s)).  Studies/Results: No results  found.  Medications: Scheduled Meds:   . docusate sodium  100 mg Oral BID  . feeding supplement  237 mL Oral BID BM  . insulin aspart  0-15 Units Subcutaneous TID WC  . metoprolol tartrate  50 mg Oral BID  . oxyCODONE  15 mg Oral Q12H  . rivaroxaban  20 mg Oral Q supper  . rosuvastatin  20 mg Oral q1800   Continuous Infusions:   . sodium chloride     PRN Meds:.ALPRAZolam, diphenhydrAMINE, HYDROcodone-acetaminophen, methocarbamol(ROBAXIN) IV, methocarbamol, metoCLOPramide (REGLAN) injection, metoCLOPramide, ondansetron (ZOFRAN) IV, ondansetron   Assessment: Principal Problem:  *Soft tissue infection of foot Active Problems:  CAD (coronary artery disease), autologous vein bypass graft  HTN (hypertension)  Diabetic foot ulcers  Diabetes mellitus  Atrial flutter with rapid ventricular response  NSTEMI (non-ST elevated myocardial infarction)   Plan: Pain control is optimal , The patient will be discharged to a skilled nursing facility, when a bed becomes available. He is not agreeable to going home with a new BKA.   LOS: 24 days  [name] 11/07/2011, 9:29 AM

## 2011-11-07 NOTE — Progress Notes (Signed)
Subjective: 6 Days Post-Op Procedure(s) (LRB): AMPUTATION BELOW KNEE (Right) Pain improving   Objective: Vital signs in last 24 hours: Temp:  [98.4 F (36.9 C)-98.6 F (37 C)] 98.6 F (37 C) (12/25 0602) Pulse Rate:  [57-58] 57  (12/25 0602) Resp:  [18-62] 20  (12/25 0602) BP: (119-136)/(49-73) 121/68 mmHg (12/25 0602) SpO2:  [93 %-96 %] 96 % (12/25 0602)  Intake/Output from previous day: 12/24 0701 - 12/25 0700 In: 276 [P.O.:276] Out: 750 [Urine:750] Intake/Output this shift: Total I/O In: -  Out: 400 [Urine:400]   Basename 11/05/11 1220  HGB 8.1*    Basename 11/05/11 1220  WBC 4.6  RBC 3.11*  HCT 25.7*  PLT 122*    Basename 11/04/11 0700  NA 136  K 3.8  CL 99  CO2 31  BUN 12  CREATININE 0.90  GLUCOSE 177*  CALCIUM 8.2*   No results found for this basename: LABPT:2,INR:2 in the last 72 hours  Neurologically intact  Assessment/Plan: 6 Days Post-Op Procedure(s) (LRB): AMPUTATION BELOW KNEE (Right) Discharge to SNF, patient does not feel his brother would help him at home  DUDA,MARCUS V 11/07/2011, 6:08 AM

## 2011-11-08 ENCOUNTER — Inpatient Hospital Stay (HOSPITAL_COMMUNITY): Payer: Self-pay

## 2011-11-08 LAB — GLUCOSE, CAPILLARY
Glucose-Capillary: 167 mg/dL — ABNORMAL HIGH (ref 70–99)
Glucose-Capillary: 237 mg/dL — ABNORMAL HIGH (ref 70–99)

## 2011-11-08 MED ORDER — INSULIN GLARGINE 100 UNIT/ML ~~LOC~~ SOLN
10.0000 [IU] | Freq: Every day | SUBCUTANEOUS | Status: DC
  Administered 2011-11-08: 10 [IU] via SUBCUTANEOUS
  Filled 2011-11-08: qty 3

## 2011-11-08 NOTE — Progress Notes (Signed)
Dressing removed from bottom of L foot with Dr. Susie Cassette present. Area remains black and hard and a small amount of dark brown drainage on old dressing. Pt has + pedal pulse of L foot. L foot is warm to touch and pink. Area cleaned with normal saline and new dressing applied. Orders received. Will continue to monitor.

## 2011-11-08 NOTE — Progress Notes (Signed)
Physical Therapy Treatment Patient Details Name: [name] MRN: [id number] DOB: 1956-03-03 Today's Date: 11/08/2011  PT Assessment/Plan  PT - Assessment/Plan Comments on Treatment Session: Pt progressing, he was able to ambulate a greater distance today without as many complaints of pain. Pt still self limiting with ambulation distance. Pt is now mod I with bed mobility. PT Plan: Discharge plan remains appropriate;Frequency remains appropriate PT Frequency: Min 3X/week Follow Up Recommendations: Skilled nursing facility Equipment Recommended: Defer to next venue PT Goals  Acute Rehab PT Goals PT Goal Formulation: With patient PT Goal: Supine/Side to Sit - Progress: Progressing toward goal PT Goal: Sit to Stand - Progress: Progressing toward goal PT Goal: Stand to Sit - Progress: Progressing toward goal PT Transfer Goal: Bed to Chair/Chair to Bed - Progress: Progressing toward goal PT Goal: Ambulate - Progress: Progressing toward goal PT Goal: Up/Down Stairs - Progress: Other (comment) (NT) PT Goal: Perform Home Exercise Program - Progress: Progressing toward goal PT Goal: Propel Wheelchair - Progress: Other (comment) (NT)  PT Treatment Precautions/Restrictions  Precautions Precautions: Fall Required Braces or Orthoses: No Restrictions Weight Bearing Restrictions: Yes RLE Weight Bearing: Non weight bearing Mobility (including Balance) Bed Mobility Bed Mobility: Yes Supine to Sit: 6: Modified independent (Device/Increase time) Sitting - Scoot to Edge of Bed: 6: Modified independent (Device/Increase time) Transfers Transfers: Yes Sit to Stand: 4: Min assist;With upper extremity assist;From bed Sit to Stand Details (indicate cue type and reason): VC for hand placement for safety. Min assist for stability into standing  Stand to Sit: 5: Supervision;With upper extremity assist;To chair/3-in-1 Stand to Sit Details: VC for hand placement for safety from  RW Ambulation/Gait Ambulation/Gait: Yes Ambulation/Gait Assistance: 4: Min assist Ambulation/Gait Assistance Details (indicate cue type and reason): Assist needed for stability throughout ambulation. VC for distance to RW for safety and balance. Ambulation Distance (Feet): 20 Feet Assistive device: Rolling walker Gait Pattern: Step-to pattern;Decreased step length - left;Trunk flexed Gait velocity: Decreased gait speed    Exercise  General Exercises - Lower Extremity Quad Sets: AROM;Right;10 reps;Other (comment) Hip ABduction/ADduction: AROM;Strengthening;Right;10 reps;Supine Straight Leg Raises: AROM;Strengthening;Right;10 reps;Other (comment) End of Session PT - End of Session Equipment Utilized During Treatment: Gait belt Activity Tolerance: Patient limited by pain;Patient limited by fatigue Patient left: in chair;with call bell in reach Nurse Communication: Mobility status for transfers;Mobility status for ambulation General Behavior During Session: [facility] for tasks performed Cognition: [facility] for tasks performed  Milana Kidney 11/08/2011, 11:38 AM  11/08/2011 Milana Kidney DPT PAGER: [phone] OFFICE: [phone]

## 2011-11-08 NOTE — Progress Notes (Addendum)
Pt is s/p R BKA from infection. Pt has a dry ace coban dressing to R stump. Pt keeps R BKA elevated on pillow. Pt refuses to turn self. Pt has a dressing to L bottom of foot that is dry and intact. Pt LBM 12/23. +BS x4. Pt denies nausea or vomiting and tolerates diet fair to good. Pt repts passing gas. Pt is receiving ensure supplements per MD order. No s/sx resp or cardiac distress and no c/o such. Pt sats 96% RA. Pt performs IS per order. Pt repts a nonprod cough. Lungs CTA but diminished in the bases. Pt is alert and oriented x 3 but forgetful at times. Pt, per rept, had an excoriated sacral area. Pt is continent and applying barrier cream now to only blanchable red buttocks. Pt refuses to turn. Pt lips remained blistered. Upper lip is worse than bottom lip. Pt applies petroleum jelly to lips. Pt voids quantity sufficient without difficulty.

## 2011-11-08 NOTE — Progress Notes (Signed)
Subjective: Mild drainage from the base of his left great toe per RN  Objective: Vital signs in last 24 hours: Filed Vitals:   11/07/11 0602 11/07/11 1400 11/07/11 2105 11/08/11 0600  BP: 121/68 135/75 121/73 151/76  Pulse: 57 65 66 65  Temp: 98.6 F (37 C) 98.3 F (36.8 C) 98.7 F (37.1 C) 98.5 F (36.9 C)  TempSrc: Oral Oral Oral   Resp: 20 18 16 16   Height:      Weight:      SpO2: 96% 90% 96% 93%    Intake/Output Summary (Last 24 hours) at 11/08/11 1009 Last data filed at 11/08/11 0700  Gross per 24 hour  Intake      0 ml  Output   1250 ml  Net  -1250 ml    Weight change:   General: Alert, awake, oriented x3, in no acute distress. HEENT: No bruits, no goiter. Heart: Regular rate and rhythm, without murmurs, rubs, gallops. Lungs: Clear to auscultation bilaterally. Abdomen: Soft, nontender, nondistended, positive bowel sounds. Extremities: Right BKA, mucopurulent discharge from the plantar aspect, of the base of the left great toe Neuro: Grossly intact, nonfocal.    Lab Results: @LABTEST @  Micro: No results found for this or any previous visit (from the past 240 hour(s)).  Studies/Results: No results found.  Medications:  Scheduled Meds:   . docusate sodium  100 mg Oral BID  . feeding supplement  237 mL Oral BID BM  . insulin aspart  0-15 Units Subcutaneous TID WC  . metoprolol tartrate  50 mg Oral BID  . oxyCODONE  15 mg Oral Q12H  . rivaroxaban  20 mg Oral Q supper  . rosuvastatin  20 mg Oral q1800   Continuous Infusions:   . sodium chloride     PRN Meds:.ALPRAZolam, diphenhydrAMINE, HYDROcodone-acetaminophen, methocarbamol(ROBAXIN) IV, methocarbamol, metoCLOPramide (REGLAN) injection, metoCLOPramide, ondansetron (ZOFRAN) IV, ondansetron   Assessment: Principal Problem:  *Soft tissue infection of foot Active Problems:  CAD (coronary artery disease), autologous vein bypass graft  HTN (hypertension)  Diabetic foot ulcers  Diabetes mellitus  Atrial flutter with rapid ventricular response  NSTEMI (non-ST elevated myocardial infarction)   Plan:  #1 I have ordered x-rays of his left foot, to rule out osteomyelitis. #2 Dr. Lajoyce Corners has been notified #3 SNF when bed available #4 start Lantus  LOS: 25 days   [facility] 11/08/2011, 10:09 AM

## 2011-11-08 NOTE — Progress Notes (Signed)
Patient ID: [name], male   DOB: October 27, 1956, 55 y.o.   MRN: [id number] Left foot callous stable, continue mepilex drsg change PRN.  Right BKA feeling much better.  Change dressing before D/C  I'll f/u 2 weeks after d/c

## 2011-11-08 NOTE — Progress Notes (Signed)
Rehab admissions - Patient has very little assistance at home.  Likely will need SNF.  Pager [phone]

## 2011-11-09 LAB — GLUCOSE, CAPILLARY: Glucose-Capillary: 228 mg/dL — ABNORMAL HIGH (ref 70–99)

## 2011-11-09 MED ORDER — OXYCODONE HCL 15 MG PO TB12: 15.0000 mg | ORAL_TABLET | Freq: Two times a day (BID) | ORAL | Status: DC

## 2011-11-09 MED ORDER — METOPROLOL TARTRATE 50 MG PO TABS: 50.0000 mg | ORAL_TABLET | Freq: Two times a day (BID) | ORAL | Status: DC

## 2011-11-09 MED ORDER — INSULIN GLARGINE 100 UNIT/ML ~~LOC~~ SOLN
15.0000 [IU] | Freq: Every day | SUBCUTANEOUS | Status: DC
  Administered 2011-11-09: 15 [IU] via SUBCUTANEOUS

## 2011-11-09 MED ORDER — ROSUVASTATIN CALCIUM 20 MG PO TABS: 20.0000 mg | ORAL_TABLET | Freq: Every day | ORAL | Status: DC

## 2011-11-09 MED ORDER — INSULIN GLARGINE 100 UNIT/ML ~~LOC~~ SOLN: 15.0000 [IU] | Freq: Every day | SUBCUTANEOUS | Status: DC

## 2011-11-09 MED ORDER — METHOCARBAMOL 500 MG PO TABS: 500.0000 mg | ORAL_TABLET | Freq: Four times a day (QID) | ORAL | Status: DC | PRN

## 2011-11-09 MED ORDER — RIVAROXABAN 20 MG PO TABS: 20.0000 mg | ORAL_TABLET | Freq: Every day | ORAL | Status: DC

## 2011-11-09 MED ORDER — INSULIN ASPART 100 UNIT/ML ~~LOC~~ SOLN: 0.0000 [IU] | Freq: Three times a day (TID) | SUBCUTANEOUS | Status: DC

## 2011-11-09 MED ORDER — POLYETHYLENE GLYCOL 3350 17 G PO PACK
17.0000 g | PACK | Freq: Every day | ORAL | Status: DC
  Administered 2011-11-09: 17 g via ORAL
  Filled 2011-11-09 (×2): qty 1

## 2011-11-09 MED ORDER — DSS 100 MG PO CAPS: 100.0000 mg | ORAL_CAPSULE | Freq: Two times a day (BID) | ORAL | Status: AC

## 2011-11-09 MED ORDER — ALPRAZOLAM 1 MG PO TABS: 1.0000 mg | ORAL_TABLET | Freq: Two times a day (BID) | ORAL | Status: DC | PRN

## 2011-11-09 MED ORDER — ASPIRIN 81 MG PO TBEC: 81.0000 mg | DELAYED_RELEASE_TABLET | Freq: Every day | ORAL | Status: AC

## 2011-11-09 NOTE — Progress Notes (Addendum)
Pt admitted for diabetic foot ulcer of RLE that resulted in R BKA. Pt has a dry ace coban dressing of R BKA. Pt has a blanchable red buttock and is applying EPC/barrier cream to area. Pt is turning self and elevates R BKA on pillow. Pt has cracked and scabbed areas of upper/lower lips. Pt is applying petroleum jelly to lips and pt feels that lips are getting better. Pt tolerates diet fair to good. BS+x4. Pt repts passing gas and denies nausea today. Pt repts LBM 12/23. Pt is receiving stool softners per order. Will request an order for a PO laxative today and administer per order. Pt has no IV access. Pt lungs CTA but diminished in the bases. Pt repts nonprod cough. Pt sats 95% RA. No s/sx cardiac or resp distress and no c/o such. Pt has a dry mepilex to the bottom of the L foot wound area that is dry and intact. No pressure areas noted of L heel. Pt floats L heel. + pedal pulse on L.

## 2011-11-09 NOTE — Progress Notes (Signed)
Inpatient Diabetes Program Recommendations  AACE/ADA: New Consensus Statement on Inpatient Glycemic Control (2009)  Target Ranges:  Prepandial:   less than 140 mg/dL      Peak postprandial:   less than 180 mg/dL (1-2 hours)      Critically ill patients:  140 - 180 mg/dL   Reason: Hyperglycemia CBGs 167, 237, 239, 261  Inpatient Diabetes Program Recommendations Insulin - Basal: Increase Lantus to 20 units Insulin - Meal Coverage: add meal coverage 4 units TID for elevated post-prandials

## 2011-11-09 NOTE — Progress Notes (Signed)
Physical Therapy Treatment Patient Details Name: [name] MRN: [id number] DOB: 09-Dec-1955 Today's Date: 11/09/2011  PT Assessment/Plan  PT - Assessment/Plan Comments on Treatment Session: Pt progressing daily with ambulation distance. Pt still with increased pain during ambulation, although he has improved his tolerance for longer distances. Plan to d/c today PT Plan: Discharge plan remains appropriate;Frequency remains appropriate PT Frequency: Min 3X/week Follow Up Recommendations: Skilled nursing facility Equipment Recommended: Defer to next venue PT Goals  Acute Rehab PT Goals PT Goal Formulation: With patient Pt will go Supine/Side to Sit: Independently;with HOB 0 degrees PT Goal: Supine/Side to Sit - Progress: Progressing toward goal PT Goal: Sit to Stand - Progress: Progressing toward goal PT Goal: Stand to Sit - Progress: Progressing toward goal PT Transfer Goal: Bed to Chair/Chair to Bed - Progress: Progressing toward goal PT Goal: Ambulate - Progress: Progressing toward goal PT Goal: Up/Down Stairs - Progress: Other (comment) (NT) PT Goal: Perform Home Exercise Program - Progress: Progressing toward goal PT Goal: Propel Wheelchair - Progress: Other (comment) (NT)  PT Treatment Precautions/Restrictions  Precautions Precautions: Fall Required Braces or Orthoses: No Restrictions Weight Bearing Restrictions: Yes RLE Weight Bearing: Non weight bearing Mobility (including Balance) Bed Mobility Bed Mobility: No Transfers Transfers: Yes Sit to Stand: 5: Supervision;With upper extremity assist;From bed Sit to Stand Details (indicate cue type and reason): Supervision for safety Stand to Sit: 5: Supervision;With upper extremity assist;To chair/3-in-1 Stand to Sit Details: VC for hand palcement for safety Ambulation/Gait Ambulation/Gait: Yes Ambulation/Gait Assistance: 4: Min assist (Minguard assist) Ambulation/Gait Assistance Details (indicate cue type and reason): VC  for safety with distance to RW as well as upright posture. Encouraged pt to get a shoe if possible to relieve pressure from LLE during ambulation Ambulation Distance (Feet): 40 Feet Assistive device: Rolling walker Gait Pattern: Step-to pattern;Decreased step length - left;Trunk flexed Gait velocity: Decreased gait speed Stairs: No    Exercise  General Exercises - Lower Extremity Quad Sets: AROM;Right;10 reps;Other (comment) Hip ABduction/ADduction: AROM;Strengthening;Right;10 reps;Supine Straight Leg Raises: AROM;Strengthening;Right;10 reps;Other (comment) Hip Flexion/Marching: AROM;Strengthening;Right;10 reps End of Session PT - End of Session Equipment Utilized During Treatment: Gait belt Activity Tolerance: Patient limited by pain;Patient limited by fatigue Patient left: in chair;with call bell in reach Nurse Communication: Mobility status for transfers;Mobility status for ambulation General Behavior During Session: [facility] for tasks performed Cognition: [facility] for tasks performed  Milana Kidney 11/09/2011, 11:56 AM  11/09/2011 Milana Kidney DPT PAGER: [phone] OFFICE: [phone]

## 2011-11-09 NOTE — Discharge Summary (Signed)
Physician Discharge Summary  [name] MRN: [id number] DOB/AGE: 1955/12/11 55 y.o.  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 Qty: 30 capsule, Refills: 0    HYDROcodone-acetaminophen (VICODIN) 5-500 MG per tablet Take 1 tablet by mouth every 6 (six) hours as needed for pain. Qty: 30 tablet, Refills: 0    insulin aspart (NOVOLOG) 100 UNIT/ML injection Inject 0-15 Units into the skin 3 (three) times daily with meals. Qty: 1 vial, Refills: 10    insulin glargine (LANTUS) 100 UNIT/ML injection Inject 15 Units into the skin daily. Qty: 10 mL, Refills: 10    methocarbamol (ROBAXIN) 500 MG tablet Take 1 tablet (500 mg total) by mouth every 6 (six) hours as needed. Qty: 30 tablet, Refills: 0    metoprolol (LOPRESSOR) 50 MG tablet Take 1 tablet (50 mg total) by mouth 2 (two) times daily. Qty: 60 tablet, Refills: 0    oxyCODONE (OXYCONTIN) 15 MG TB12 Take 1 tablet (15 mg total) by mouth every 12 (twelve) hours. Qty: 60 tablet, Refills: 0    oxyCODONE-acetaminophen (ROXICET) 5-325 MG per tablet Take 1 tablet by mouth every 4 (four) hours as needed for pain. Qty: 60 tablet, Refills: 0    rivaroxaban 20 MG TABS Take 20 mg by mouth daily with supper. Qty: 60 tablet, Refills: 0    rosuvastatin (CRESTOR) 20 MG tablet Take 1 tablet (20 mg total) by mouth daily at 6 PM. Qty: 30 tablet, Refills: 0     Aspirin 81 mg by mouth daily        CONTINUE these medications which have CHANGED   Details  ALPRAZolam (XANAX) 1 MG tablet Take 1 tablet (1 mg total) by mouth 2 (two) times daily as needed for sleep. anxiety Qty: 30 tablet, Refills: 0      STOP taking these medications     metFORMIN (GLUCOPHAGE) 500 MG tablet         Discharge Condition: Stable Disposition: Discharge to skilled nursing facility when bed available   Consults: Nadara Mustard, MD   Significant Diagnostic Studies: Ct Angio Chest W/cm &/or Wo Cm  10/15/2011  *RADIOLOGY REPORT*  C  IMPRESSION:  1.  No evidence of acute pulmonary embolism or other acute chest process. 2.  Cholelithiasis. 3.  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  Correlate clinically. 4.  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  Original Report Authenticated By: Gerrianne Scale, M.D.   Mr Foot Right W Wo Contrast  10/21/2011  *RADIOLOGY REPORT*   IMPRESSION:  1.  Status post amputation at the level the bases of the first and second metatarsals.  No evidence of osteomyelitis is identified. 2.  Persistent marked cellulitis with a rim enhancing fluid collection compatible with abscess.  As noted above, the collection extends from approximately the medial aspect of the base of the first metatarsal distally to the end of the medial aspect of the foot.  At the level of the mid metatarsals, the collection extends over  the dorsum of the foot laterally to the level of the fourth metatarsal.  Original Report Authenticated By: Bernadene Bell. Maricela Curet, M.D.   Mr Foot Right W Wo Contrast  10/15/2011  *R   IMPRESSION:  1.  Large soft tissue abscess involving the medial forefoot with gas formation. 2.  Osteomyelitis involving the first metatarsal and first proximal phalanx. 3.  Diffuse cellulitis and myofasciitis.  Original Report Authenticated By: P. Loralie Champagne, M.D.   Dg Foot 2 Views Left  10/15/2011  *RADIOLOGY REPORT*  Cl   IMPRESSION:  1.  No evidence of fracture or  dislocation.  No definite evidence for osteomyelitis, though if there is significant clinical concern for osteomyelitis, MRI could be considered for further evaluation. 2.  Soft tissue defect and swelling along the plantar aspect of the forefoot.  Original Report Authenticated By: Tonia Ghent, M.D.   Dg Foot 2 Views Right  10/15/2011  *RADIOLOGY REPORT*   IMPRESSION:  1.  No evidence of fracture or dislocation.  No definite evidence for osteomyelitis, though if there is significant clinical concern for osteomyelitis, MRI could be considered for further evaluation. 2.  Significant diffuse soft tissue air about the first toe and dorsum of the forefoot, concerning for necrotizing fasciitis.  Findings were discussed with Earley Favor at 01:03 a.m. on 10/14/2011.  Original Report Authenticated By: Tonia Ghent, M.D.   Dg Foot Complete Left  11/08/2011  *RADIOLOGY REPORT*  Clinical Data: Sore on the arch; diabetes  LEFT FOOT - COMPLETE 3+ VIEW  Comparison: October 14, 2011  Findings: There is no evidence of periosteal reaction or osseous erosion.  No fracture or dislocation.  The joint compartments are maintained.  No abnormal soft tissue gas is present.  Vascular calcifications are noted.  IMPRESSION: There is no evidence of acute abnormality.  Original Report Authenticated By: Brandon Melnick, M.D.   2-D echo Left ventricle: The cavity size was normal. Wall thickness was normal. Systolic function was normal. The estimated ejection fraction was in the range of 55% to 60%. Severe hypokinesis of the basal-midinferolateral and inferior myocardium. Atrial arrhythmia prevents evaluation of LV diastolic function.    Microbiology: No results found for this or any previous visit (from the past 240 hour(s)).   Labs: Results for orders placed during the hospital encounter of 10/14/11 (from the past 48 hour(s))  GLUCOSE, CAPILLARY     Status: Abnormal   Collection Time   11/07/11 11:04 AM      Component  Value Range Comment   Glucose-Capillary 212 (*) 70 - 99 (mg/dL)   GLUCOSE, CAPILLARY     Status: Abnormal   Collection Time   11/07/11  4:27 PM      Component Value Range Comment   Glucose-Capillary 205 (*) 70 - 99 (mg/dL)    Comment 1 Documented in Chart      Comment 2 Notify RN     GLUCOSE, CAPILLARY     Status: Abnormal   Collection Time   11/07/11  9:49 PM      Component Value Range Comment   Glucose-Capillary 257 (*) 70 - 99 (mg/dL)    Comment 1 Documented in Chart      Comment 2 Notify RN     GLUCOSE, CAPILLARY     Status: Abnormal   Collection Time   11/08/11  7:02 AM      Component Value Range Comment   Glucose-Capillary 167 (*) 70 - 99 (mg/dL)   GLUCOSE, CAPILLARY     Status:  Abnormal   Collection Time   11/08/11 11:09 AM      Component Value Range Comment   Glucose-Capillary 237 (*) 70 - 99 (mg/dL)    Comment 1 Notify RN     GLUCOSE, CAPILLARY     Status: Abnormal   Collection Time   11/08/11  4:33 PM      Component Value Range Comment   Glucose-Capillary 239 (*) 70 - 99 (mg/dL)    Comment 1 Documented in Chart      Comment 2 Notify RN     GLUCOSE, CAPILLARY     Status: Abnormal   Collection Time   11/08/11  9:47 PM      Component Value Range Comment   Glucose-Capillary 261 (*) 70 - 99 (mg/dL)   GLUCOSE, CAPILLARY     Status: Abnormal   Collection Time   11/09/11  7:04 AM      Component Value Range Comment   Glucose-Capillary 165 (*) 70 - 99 (mg/dL)      HPI : 76GNF with h/o 3v CABG then stents x2 (BMS and DES, unclear where), diabetes, and HTN  presents with bilateral plantar 1st MTP DM foot ulcers and right foot soft tissue  infection likely due to DM ulcer. Also found to by hyperglyemic, elevated Cr, and in Aflutter with RVR.  Pt states he gets most of his care at [facility]. For the past couple months he  has had plantar ulcers under his 1st MTP's, but has gone about his daily business  including walking his dog and caring for his father at  home who has Alzheimer's. For the  past week, he's had more significant right foot pain, and over the past couple days the  dorsum of the right foot started blistering. He's had subjective chills but no  documented or subjective fevers. He's been sleeping more but doesn't endorse feeling  really ill. He went to see his PCP 3 days ago but was told they were too backed up and  busy to see him, so eventually he called his neice and her mother (ex sister in law) to  help him go to the hospital. Left foot plain film  showed no evidence of osteo but there was soft tissue defect and swelling along plantar  aspect of forefoot. Right foot plain film showed no evidence of osteo, but significant  diffuse soft tissue air about the first toe and dorsum of forefoot, concerning for  Worsening cellulitis. Orthopedics was consulted.  HOSPITAL COURSE:   #1 Bilateral plantar ulcers with right foot cellulitis, blister formation, and  subcutaneous gas: Right foot looks infected with portal of entry plantar diabetic ulcer.  The left foot has a planter diabetic ulcer but doesn't look infected. Ortho has seen the  patient, recommendtranstibial amputation - MRI of the foot showed marked cellulitis with a rim enhancing fluid  collection compatible with abscess. As noted above, the collection  extends from approximately the medial aspect of the base of the  first metatarsal distally to the end of the medial aspect of the  foot. At the level of the mid metatarsals, the collection extends  over the dorsum of the foot laterally to the level of the fourth  metatarsal.  He underwent  AMPUTATION BELOW KNEE right leg , and application of a cell xenograft. On December 19. He also has Left foot callous stable, continue mepilex drsg change PRN. Reasonable effort prior to discharge did not show any obvious evidence of infection or osteomyelitis  -  Prior to surgery he was started on empiric Vancomycin/Zosyn renally dosed,  infectious disease was consulted. Post surgery his antibiotics were discontinued. He had limited participation in physical therapy initially due to uncontrolled pain. His pain control has been adequate after being initiated on OxyContin by mouth. The patient is currently awaiting discharge to skilled nursing facility.  #2- Diabetes was found to be uncontrolled in the setting of his infection, he has been initiated on Lantus. He will continue with sliding scale insulin. His metformin has been discontinued.   3. AFlutter:During admission patient also went into atrial flutter, was found to have elevated troponins, was treated for NSTEMI/ demand ischemia with IV heparin and IV Cardizem. Rate has been well controlled now on on by mouth Cardizem and metoprolol. He remained on IV heparin until 10/24/2011 when it was discontinued due to bleeding from the right foot wound. Per cardiology recommendations (Dr. Rennis Golden), patient can be started on xarelto for a month and he would require cardioversion as an outpatient. She is rate controlled on metoprolol. He would need outpatient cardiology followup with Dr. Rennis Golden.  #4 thrombocytopenia Patient was found to have a critical drop in his platelet count to 12 on 10/25/2011 and was transferred to step down unit. Hit panel was sent and came as falsely positive. Hematology was consulted and patient was seen by Dr. Clelia Croft who also thought that it could be from beta-lactam antibiotics. DIC was ruled out. Patient continued to spike fevers with SIRS, patient was being followed by infectious disease (Dr. Algis Liming), was recommended IV Tigacyl and ciprofloxacin. Plan is for right BKA tomorrow 11/01/2011 per Dr. Lajoyce Corners. Platelet count has been improving. His last known platelet count was 122K.   4. Acute vs chronic kidney disease: Renal 28 / 1.5 with no baseline. Given  comorbidities, likely prerenal. This improved with IV hydration and prior to discharge his creatinine was noted to be  0.90.   5. CAD s/p CABG, stents x2: He would be restarted on aspirin 81 mg by mouth daily and he will continue with his beta blocker.  6. Social: Difficult home situation because he cares for his dad with Alzheimer's,  younger brother lives at home as well but doesn't get along with the dad at all and  patient is in between the two, creating significant stress. He will be discharged to skilled nursing facility when bed is available.      Discharge Exam:   Blood pressure 121/74, pulse 60, temperature 98.6 F (37 C), temperature source Oral, resp. rate 16, height 5\' 7"  (1.702 m), weight 76.8 kg (169 lb 5 oz), SpO2 95.00%.  General: Alert, awake, oriented x3, in no acute distress. HEENT: No bruits, no goiter. Heart: Regular rate and rhythm, without murmurs, rubs, gallops. Lungs: Clear to auscultation bilaterally. Abdomen: Soft, nontender, nondistended, positive bowel sounds. Extremities: Right BKA Neuro: Grossly intact, nonfocal.     Discharge Orders    Future Orders Please Complete By Expires   Diet - low sodium heart healthy      Change dressing      Scheduling Instructions:   Change right BKA dressing at time of d/c with 4x4, abd kerlex and ace wrap   Increase activity slowly      Change dressing (specify)      Comments:   Per recommendations of Dr. Lajoyce Corners   Call MD for:  temperature >100.4      Call MD for:  severe uncontrolled pain      Call MD for:  difficulty  breathing, headache or visual disturbances         Follow-up Information    Follow up with DUDA,MARCUS V, MD in 2 weeks.   Contact information:   [address] [place] [address] [phone]          Signed: Richarda Overlie 11/09/2011, 8:52 AM

## 2011-11-09 NOTE — Progress Notes (Signed)
CSW reviewed chart which stated [facility] would not be able to accept patient. CSW met with patient at bedside who was agreeable to SNF. CSW explained process and CSW role. CSW faxed out patient to [facility] and [facility]. Patient will need to dc on a letter of guarantee. CSW completed FL2 and placed in shadow chart. CSW spoke with patient regarding his desire to return home but his understanding of therapy before being able to safely care for himself. CSW called financial counselors and left a message regarding patient's pending Medicaid. CSW will continue to follow. [place], [place] [phone]

## 2011-11-10 LAB — GLUCOSE, CAPILLARY: Glucose-Capillary: 244 mg/dL — ABNORMAL HIGH (ref 70–99)

## 2011-11-10 MED ORDER — OXYCODONE HCL 15 MG PO TB12: 15.0000 mg | ORAL_TABLET | Freq: Two times a day (BID) | ORAL | Status: DC

## 2011-11-10 MED ORDER — POLYETHYLENE GLYCOL 3350 17 G PO PACK: 17.0000 g | PACK | Freq: Every day | ORAL | Status: DC

## 2011-11-10 MED ORDER — OXYCODONE-ACETAMINOPHEN 5-325 MG PO TABS: 1.0000 | ORAL_TABLET | ORAL | Status: DC | PRN

## 2011-11-10 NOTE — Progress Notes (Addendum)
Nutrition Follow-up  Diet Order:  Heart Healthy. PO intake 100% per flowsheet records. Ensure Immune Health PO BID. Disposition: SNF placement.  Meds: Scheduled Meds:   . docusate sodium  100 mg Oral BID  . feeding supplement  237 mL Oral BID BM  . insulin aspart  0-15 Units Subcutaneous TID WC  . insulin glargine  15 Units Subcutaneous Daily  . metoprolol tartrate  50 mg Oral BID  . oxyCODONE  15 mg Oral Q12H  . polyethylene glycol  17 g Oral Daily  . rivaroxaban  20 mg Oral Q supper  . rosuvastatin  20 mg Oral q1800   Continuous Infusions:   . sodium chloride     PRN Meds:.ALPRAZolam, diphenhydrAMINE, HYDROcodone-acetaminophen, methocarbamol(ROBAXIN) IV, methocarbamol, metoCLOPramide (REGLAN) injection, metoCLOPramide, ondansetron (ZOFRAN) IV, ondansetron  Labs:  CMP     Component Value Date/Time   NA 136 11/04/2011 0700   K 3.8 11/04/2011 0700   CL 99 11/04/2011 0700   CO2 31 11/04/2011 0700   GLUCOSE 177* 11/04/2011 0700   BUN 12 11/04/2011 0700   CREATININE 0.90 11/04/2011 0700   CALCIUM 8.2* 11/04/2011 0700   PROT 6.7 10/23/2011 0540   ALBUMIN 1.8* 10/23/2011 0540   AST 30 10/23/2011 0540   ALT 15 10/23/2011 0540   ALKPHOS 64 10/23/2011 0540   BILITOT 0.3 10/23/2011 0540   GFRNONAA >90 11/04/2011 0700   GFRAA >90 11/04/2011 0700     Intake/Output Summary (Last 24 hours) at 11/10/11 0911 Last data filed at 11/10/11 1610  Gross per 24 hour  Intake    600 ml  Output   1725 ml  Net  -1125 ml    Weight Status:  76.8 kg (12/22) -- no new weight  Nutrition Dx:  Malnutrition, ongoing  Goal:  Meet 90-100% of estimated nutrition need with meal consumption and supplements, met Monitor: PO intake, labs, weight, I/O's  Intervention:    Continue Ensure Immune Health PO BID  RD to follow for nutrition care plan   Alger Memos Pager #:  [phone]

## 2011-11-10 NOTE — Progress Notes (Signed)
CSW continues to search for a SNF bed that will accept letter of guarantee. CSW has faxed patient out to [facility], [facility], [facility] and [facility]. CSW spoke with several facilities to follow up with referral. [name] continues to review information but has not offered a bed yet. CSW faxed additional information to [facility] but has not received a final determination yet. CSW will continue to follow. [place], [place] [phone]

## 2011-11-10 NOTE — Discharge Summary (Signed)
Subjective: Please refer to the D/C summary dictated by Dr. Susie Cassette for hospital course up until 11/09/11.  Pt had an uneventful night and day and states that he feels better today than any day in the past few weeks.  Objective: Filed Vitals:   11/09/11 1400 11/09/11 2126 11/10/11 0531 11/10/11 0935  BP: 130/67 123/64 125/53   Pulse: 96 70 51 68  Temp: 99.2 F (37.3 C) 98.2 F (36.8 C) 97.3 F (36.3 C)   TempSrc:      Resp: 18 18 18    Height:      Weight:      SpO2: 96% 98% 99%    Weight change:   Intake/Output Summary (Last 24 hours) at 11/10/11 1433 Last data filed at 11/10/11 0653  Gross per 24 hour  Intake    360 ml  Output   1525 ml  Net  -1165 ml    General: Alert, awake, oriented x3, in no acute distress.  HEENT: [place]/AT PEERL, EOMI Neck: Trachea midline,  no masses, no thyromegal,y no JVD, no carotid bruit OROPHARYNX:  Moist, No exudate/ erythema/lesions.  Heart: Regular rate and rhythm, without murmurs, rubs, gallops, PMI non-displaced, no heaves or thrills on palpation.  Lungs: Clear to auscultation, no wheezing or rhonchi noted. No increased vocal fremitus resonant to percussion  Abdomen: Soft, nontender, nondistended, positive bowel sounds, no masses no hepatosplenomegaly noted..  Neuro: No focal neurological deficits noted cranial nerves II through XII grossly intact. DTRs 2+ bilaterally upper and lower extremities. Strength 5 out of 5 in bilateral upper and lower extremities. Musculoskeletal: No warm swelling or erythema around joints. 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 Psychiatric: Patient alert and oriented x3, good insight and cognition, good recent to remote recall. Lymph node survey: No cervical axillary or inguinal lymphadenopathy noted.     Lab Results: No results found for this basename: NA:2,K:2,CL:2,CO2:2,GLUCOSE:2,BUN:2,CREATININE:2,CALCIUM:2,MG:2,PHOS:2 in the last 72 hours No results found for this basename:  AST:2,ALT:2,ALKPHOS:2,BILITOT:2,PROT:2,ALBUMIN:2 in the last 72 hours No results found for this basename: LIPASE:2,AMYLASE:2 in the last 72 hours No results found for this basename: WBC:2,NEUTROABS:2,HGB:2,HCT:2,MCV:2,PLT:2 in the last 72 hours No results found for this basename: CKTOTAL:3,CKMB:3,CKMBINDEX:3,TROPONINI:3 in the last 72 hours No components found with this basename: POCBNP:3 No results found for this basename: DDIMER:2 in the last 72 hours No results found for this basename: HGBA1C:2 in the last 72 hours No results found for this basename: CHOL:2,HDL:2,LDLCALC:2,TRIG:2,CHOLHDL:2,LDLDIRECT:2 in the last 72 hours No results found for this basename: TSH,T4TOTAL,FREET3,T3FREE,THYROIDAB in the last 72 hours No results found for this basename: VITAMINB12:2,FOLATE:2,FERRITIN:2,TIBC:2,IRON:2,RETICCTPCT:2 in the last 72 hours  Micro Results: No results found for this or any previous visit (from the past 240 hour(s)).  Studies/Results: Ct Angio Chest W/cm &/or Wo Cm  10/15/2011  *RADIOLOGY REPORT*  Clinical Data:  Chest pain with shortness of breath and elevated D- dimer levels.  Diabetes.  Question pulmonary embolism.  CT ANGIOGRAPHY CHEST WITH CONTRAST  Technique:  Multidetector CT imaging of the chest was performed using the standard protocol during bolus administration of intravenous contrast.  Multiplanar CT image reconstructions including MIPs were obtained to evaluate the vascular anatomy.  Contrast: OMNIPAQUE IOHEXOL 300 MG/ML IV SOLN  Comparison:  None.  Findings:  The pulmonary arteries are well opacified with contrast. There is no evidence of acute pulmonary embolism.  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  The heart is mildly enlarged.  No enlarged mediastinal or hilar lymph nodes are demonstrated.  A pretracheal node demonstrates  a fatty hilum.  There is no pleural or pericardial effusion.  The lungs are clear.  Images through the upper abdomen demonstrate contour  irregularity of the liver suspicious for cirrhosis.  The spleen appears mildly enlarged.  There are multiple small calcified gallstones.  Review of the MIP images confirms the above findings.  IMPRESSION:  1.  No evidence of acute pulmonary embolism or other acute chest process. 2.  Cholelithiasis. 3.  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  Correlate clinically. 4.  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  Original Report Authenticated By: Gerrianne Scale, M.D.   Mr Foot Right W Wo Contrast  10/21/2011  *RADIOLOGY REPORT*  Clinical Data: Osteomyelitis.  MRI OF THE RIGHT FOREFOOT WITHOUT AND WITH CONTRAST  Technique:  Multiplanar, multisequence MR imaging was performed both before and after administration of intravenous contrast.  Contrast: 15mL MULTIHANCE GADOBENATE DIMEGLUMINE 529 MG/ML IV SOLN  Comparison: MRI 10/15/2011.  Findings: Since the prior examination, the patient has undergone amputation at the level of the proximal diaphysis of the first and second metatarsals.  Soft tissues demonstrate extensive edema and enhancement.  There appears be a soft tissue wound over the dorsum of the foot.  There is a rim enhancing fluid collection originating from the medial margin of the residual base of the first metatarsal extending distally to the end of the medial aspect of the foot.  The collection also extends over the dorsum of the foot at the level of the mid metatarsals to the level of the fourth metatarsal.  The collection cannot be discretely measured but is approximately 4.7 cm long by up to 2.8 cm cranial-caudal by 2.5 cm transverse.  IMPRESSION:  1.  Status post amputation at the level the bases of the first and second metatarsals.  No evidence of osteomyelitis is identified. 2.  Persistent marked cellulitis with a rim enhancing fluid collection compatible with abscess.  As noted above, the collection extends from approximately the medial aspect of the base of the first metatarsal  distally to the end of the medial aspect of the foot.  At the level of the mid metatarsals, the collection extends over the dorsum of the foot laterally to the level of the fourth metatarsal.  Original Report Authenticated By: Bernadene Bell. Maricela Curet, M.D.   Mr Foot Right W Wo Contrast  10/15/2011  *RADIOLOGY REPORT*  Clinical Data: Infected diabetic foot ulcer.  MRI OF THE RIGHT FOREFOOT WITHOUT AND WITH CONTRAST  Technique:  Multiplanar, multisequence MR imaging was performed both before and after administration of intravenous contrast.  Contrast: 20mL MULTIHANCE GADOBENATE DIMEGLUMINE 529 MG/ML IV SOLN  Comparison: Right foot radiographs.  Findings: There is a large soft tissue abscess involving the medial aspect of the forefoot it is largely between the first and second toe is extending along the dorsum of the first metatarsal.  As noted on the plain films there is extensive gas formation in the soft tissues.  No obvious changes of septic arthritis but there are findings for osteomyelitis involving the first metatarsal and first proximal phalanx.  There is associated diffuse cellulitis and myofasciitis.  No involvement of the forefoot or visualized hind foot bony structures.  IMPRESSION:  1.  Large soft tissue abscess involving the medial forefoot with gas formation. 2.  Osteomyelitis involving the first metatarsal and first proximal phalanx. 3.  Diffuse cellulitis and myofasciitis.  Original Report Authenticated By: P. Loralie Champagne, M.D.   Dg Foot 2 Views Left  10/15/2011  *RADIOLOGY REPORT*  Clinical Data: Penetrating wounds to the ball of the left foot, with worsening erythema and blistering.  LEFT FOOT - 2 VIEW  Comparison: None.  Findings: There is no evidence of fracture or dislocation.  No osseous erosions are seen to suggest osteomyelitis.  The joint spaces are preserved.  There is no evidence of talar subluxation; the subtalar joint is unremarkable in appearance.  Soft tissue swelling and defect are  noted along the plantar aspect of the forefoot.  IMPRESSION:  1.  No evidence of fracture or dislocation.  No definite evidence for osteomyelitis, though if there is significant clinical concern for osteomyelitis, MRI could be considered for further evaluation. 2.  Soft tissue defect and swelling along the plantar aspect of the forefoot.  Original Report Authenticated By: Tonia Ghent, M.D.   Dg Foot 2 Views Right  10/15/2011  *RADIOLOGY REPORT*  Clinical Data: Penetrating wounds to the ball of the right foot from blisters, with diffuse erythema and pain.  RIGHT FOOT - 2 VIEW  Comparison: None.  Findings: There is no evidence of fracture or dislocation.  No osseous erosions are seen to suggest osteomyelitis.  The joint spaces are preserved.  There is no evidence of talar subluxation; the subtalar joint is unremarkable in appearance.  Significant diffuse soft tissue air is noted tracking about the first toe and dorsal to the forefoot, concerning for necrotizing fasciitis.  Underlying soft tissue defects are noted along the plantar aspect of the forefoot.  IMPRESSION:  1.  No evidence of fracture or dislocation.  No definite evidence for osteomyelitis, though if there is significant clinical concern for osteomyelitis, MRI could be considered for further evaluation. 2.  Significant diffuse soft tissue air about the first toe and dorsum of the forefoot, concerning for necrotizing fasciitis.  Findings were discussed with Earley Favor at 01:03 a.m. on 10/14/2011.  Original Report Authenticated By: Tonia Ghent, M.D.   Dg Foot Complete Left  11/08/2011  *RADIOLOGY REPORT*  Clinical Data: Sore on the arch; diabetes  LEFT FOOT - COMPLETE 3+ VIEW  Comparison: October 14, 2011  Findings: There is no evidence of periosteal reaction or osseous erosion.  No fracture or dislocation.  The joint compartments are maintained.  No abnormal soft tissue gas is present.  Vascular calcifications are noted.  IMPRESSION: There is no  evidence of acute abnormality.  Original Report Authenticated By: Brandon Melnick, M.D.    Medications: I have reviewed the patient's current medications. Scheduled Meds:   . docusate sodium  100 mg Oral BID  . feeding supplement  237 mL Oral BID BM  . insulin aspart  0-15 Units Subcutaneous TID WC  . insulin glargine  15 Units Subcutaneous Daily  . metoprolol tartrate  50 mg Oral BID  . oxyCODONE  15 mg Oral Q12H  . polyethylene glycol  17 g Oral Daily  . rivaroxaban  20 mg Oral Q supper  . rosuvastatin  20 mg Oral q1800   Continuous Infusions:   . sodium chloride     PRN Meds:.ALPRAZolam, diphenhydrAMINE, HYDROcodone-acetaminophen, methocarbamol(ROBAXIN) IV, methocarbamol, metoCLOPramide (REGLAN) injection, metoCLOPramide, ondansetron (ZOFRAN) IV, ondansetron Assessment/Plan: Patient Active Hospital Problem List: Soft tissue infection of foot (10/15/2011)   Assessment: PT S/P BKA and not requiring any further antibiotics at present.   CAD (coronary artery disease), autologous vein bypass graft ()   Assessment: Pt S/P NSTEMI   Plan: medical management HTN (hypertension) ()   Assessment: BP well controlled   Diabetic foot ulcers (10/14/2011)   Assessment:  S/P amputation on right and dressing on left   Diabetes mellitus ()   Assessment: BS well controlled with Lantus and SSI. Would consider resuming Metformin when appetite more consistent.  Atrial flutter with rapid ventricular response (10/15/2011)   Assessment: HR controlled. Pt to continue Xarelto for 1 month then to be seen by cardiology for cardioversion  NSTEMI (non-ST elevated myocardial infarction) (10/17/2011)   Assessment: Medical management   Disposition: transfer to SNF today. No changes in medications as noted in D/C summary from 11/09/11.    LOS: 27 days

## 2011-11-10 NOTE — Progress Notes (Signed)
CSW faxed dc summary, medications and letter of guarantee to SNF. SNF agreeable to admission. CSW prepared dc packet. CSW informed patient and RN of dc and both were agreeable. CSW coordinated transportation via [place]. CSW is signing off. [place], [place] [phone]

## 2011-11-10 NOTE — Progress Notes (Signed)
[facility] extended a bed offer. Patient agreeable to this placement. CSW received approval from supervisor for 60 day letter of guarantee. CSW informed MD of bed availability. CSW will continue to follow to assist with dc needs. [place], [place] [phone]

## 2011-11-12 ENCOUNTER — Other Ambulatory Visit: Payer: Self-pay

## 2011-11-12 ENCOUNTER — Inpatient Hospital Stay (HOSPITAL_COMMUNITY)
Admission: EM | Admit: 2011-11-12 | Discharge: 2011-11-17 | DRG: 948 | Disposition: A | Discharge status: Skilled Nursing Facility | Payer: MEDICAID | Attending: Internal Medicine | Admitting: Internal Medicine

## 2011-11-12 DIAGNOSIS — R5381 Other malaise: Principal | ICD-10-CM

## 2011-11-12 DIAGNOSIS — L089 Local infection of the skin and subcutaneous tissue, unspecified: Secondary | ICD-10-CM

## 2011-11-12 DIAGNOSIS — F411 Generalized anxiety disorder: Secondary | ICD-10-CM | POA: Diagnosis present

## 2011-11-12 DIAGNOSIS — R5383 Other fatigue: Principal | ICD-10-CM | POA: Diagnosis present

## 2011-11-12 DIAGNOSIS — Z794 Long term (current) use of insulin: Secondary | ICD-10-CM

## 2011-11-12 DIAGNOSIS — E11621 Type 2 diabetes mellitus with foot ulcer: Secondary | ICD-10-CM

## 2011-11-12 DIAGNOSIS — I10 Essential (primary) hypertension: Secondary | ICD-10-CM

## 2011-11-12 DIAGNOSIS — I214 Non-ST elevation (NSTEMI) myocardial infarction: Secondary | ICD-10-CM

## 2011-11-12 DIAGNOSIS — Z951 Presence of aortocoronary bypass graft: Secondary | ICD-10-CM

## 2011-11-12 DIAGNOSIS — Z89519 Acquired absence of unspecified leg below knee: Secondary | ICD-10-CM

## 2011-11-12 DIAGNOSIS — S88119A Complete traumatic amputation at level between knee and ankle, unspecified lower leg, initial encounter: Secondary | ICD-10-CM

## 2011-11-12 DIAGNOSIS — I25810 Atherosclerosis of coronary artery bypass graft(s) without angina pectoris: Secondary | ICD-10-CM

## 2011-11-12 DIAGNOSIS — I2510 Atherosclerotic heart disease of native coronary artery without angina pectoris: Secondary | ICD-10-CM | POA: Diagnosis present

## 2011-11-12 DIAGNOSIS — R531 Weakness: Secondary | ICD-10-CM

## 2011-11-12 DIAGNOSIS — I4891 Unspecified atrial fibrillation: Secondary | ICD-10-CM | POA: Diagnosis present

## 2011-11-12 DIAGNOSIS — E119 Type 2 diabetes mellitus without complications: Secondary | ICD-10-CM | POA: Diagnosis present

## 2011-11-12 DIAGNOSIS — Z7982 Long term (current) use of aspirin: Secondary | ICD-10-CM

## 2011-11-12 DIAGNOSIS — I4892 Unspecified atrial flutter: Secondary | ICD-10-CM

## 2011-11-12 DIAGNOSIS — I252 Old myocardial infarction: Secondary | ICD-10-CM

## 2011-11-12 LAB — BASIC METABOLIC PANEL
BUN: 20 mg/dL (ref 6–23)
CO2: 29 mEq/L (ref 19–32)
Calcium: 9.6 mg/dL (ref 8.4–10.5)
Chloride: 100 mEq/L (ref 96–112)
Creatinine, Ser: 1.02 mg/dL (ref 0.50–1.35)
GFR calc Af Amer: >90 mL/min (ref >90)
GFR calc non Af Amer: 80 mL/min — ABNORMAL LOW (ref >90)
Glucose, Bld: 273 mg/dL — ABNORMAL HIGH (ref 70–99)
Potassium: 4.1 mEq/L (ref 3.5–5.1)
Sodium: 138 mEq/L (ref 135–145)

## 2011-11-12 LAB — CBC
HCT: 33.2 % — ABNORMAL LOW (ref 39.0–52.0)
Hemoglobin: 10.4 g/dL — ABNORMAL LOW (ref 13.0–17.0)
MCH: 25.6 pg — ABNORMAL LOW (ref 26.0–34.0)
MCHC: 31.3 g/dL (ref 30.0–36.0)
MCV: 81.8 fL (ref 78.0–100.0)
Platelets: 191 10*3/uL (ref 150–400)
RBC: 4.06 MIL/uL — ABNORMAL LOW (ref 4.22–5.81)
RDW: 16.5 % — ABNORMAL HIGH (ref 11.5–15.5)
WBC: 7.3 10*3/uL (ref 4.0–10.5)

## 2011-11-12 LAB — URINALYSIS, ROUTINE W REFLEX MICROSCOPIC
Bilirubin Urine: NEGATIVE
Glucose, UA: 250 mg/dL — AB
Hgb urine dipstick: NEGATIVE
Ketones, ur: NEGATIVE mg/dL
Leukocytes, UA: NEGATIVE
Nitrite: NEGATIVE
Protein, ur: NEGATIVE mg/dL
Specific Gravity, Urine: 1.019 (ref 1.005–1.030)
Urobilinogen, UA: 0.2 mg/dL (ref 0.0–1.0)
pH: 6 (ref 5.0–8.0)

## 2011-11-12 LAB — GLUCOSE, CAPILLARY: Glucose-Capillary: 159 mg/dL — ABNORMAL HIGH (ref 70–99)

## 2011-11-12 NOTE — ED Notes (Signed)
SW paged per Magda Paganini per [employer]

## 2011-11-12 NOTE — ED Notes (Signed)
Pt is currently alert and oriented. Lab at bedside. Pt is not displaying any respiratory or cardiac distress. Introduced self and explained plan of care. Will continue to monitor.

## 2011-11-12 NOTE — ED Notes (Signed)
Patient currently resting quietly in bed; no respiratory or acute distress noted.  Patient has no questions or concerns at this time.  Will continue to monitor. 

## 2011-11-12 NOTE — ED Notes (Signed)
Received bedside report from [place], [place].  Patient currently resting quietly in bed; no respiratory or acute distress noted.  Lab currently at bedside.  Updated patient on plan of care; informed patient that we need a urine sample and need to do an EKG.  Patient has no other questions or concerns at this time.  Will continue to monitor.

## 2011-11-12 NOTE — ED Notes (Signed)
Pt states he was discharged yesterday after an amputation of RLE, states he thinks he was discharged too soon. C/o fatigue and lack of energy, " i cant even move across the bed." also states "i feel impacted." c/o hard, intermittent stool.

## 2011-11-12 NOTE — ED Provider Notes (Signed)
History     CSN: [id number]  Arrival date & time 11/12/11  1549   First MD Initiated Contact with Patient 11/12/11 1908      Chief Complaint  Patient presents with  . Fatigue    (Consider location/radiation/quality/duration/timing/severity/associated sxs/prior treatment) HPI  Past Medical History  Diagnosis Date  . CAD (coronary artery disease), autologous vein bypass graft     s/p CABG in 80's or 90's, then cath x2, last in the 90's with DES and BMS placed but not sure where. Dr. Lupita Shutter at [facility]   . HTN (hypertension)   . 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 Diabetes mellitus   . Anxiety   . Headache   . Atrial flutter with rapid ventricular response 10/15/2011  . NSTEMI (non-ST elevated myocardial infarction) 10/17/2011    Past Surgical History  Procedure Date  . Coronary artery bypass graft   . No past surgeries   . Amputation 10/17/2011    Procedure: AMPUTATION RAY;  Surgeon: Cammy Copa;  Location: [facility] OR;  Service: Orthopedics;  Laterality: Right;  First and Second Ray Amputation  . Amputation 11/01/2011    Procedure: AMPUTATION BELOW KNEE;  Surgeon: Nadara Mustard, MD;  Location: [facility] OR;  Service: Orthopedics;  Laterality: Right;  Right Below Knee Amputation    Family History  Problem Relation Age of Onset  . Alzheimer's disease Father   . Melanoma Father   . Benign prostatic hyperplasia Father   . Coronary artery disease Father     s/p CABG  . Heart attack Brother   . Coronary artery disease Brother     s/p CABG  . Lymphoma Sister     non Hodgkin's   . Aortic aneurysm Mother   . Anemia Mother     History  Substance Use Topics  . Smoking status: Former Smoker -- 0.5 packs/day for 35 years    Types: Cigarettes    Quit date: 07/03/2011  . Smokeless tobacco: Not on file  . Alcohol Use: No      Review of Systems  Allergies  Review of patient's allergies indicates  no known allergies.  Home Medications   Current Outpatient Rx  Name Route Sig Dispense Refill  . ALPRAZOLAM 1 MG PO TABS Oral Take 1 mg by mouth at bedtime as needed. anxiety     . ASPIRIN 81 MG PO TBEC Oral Take 1 tablet (81 mg total) by mouth daily. Swallow whole. 30 tablet 12  . DSS 100 MG PO CAPS Oral Take 100 mg by mouth 2 (two) times daily. 30 capsule 0  . INSULIN ASPART 100 UNIT/ML [place] SOLN Subcutaneous Inject 0-15 Units into the skin 3 (three) times daily with meals. 1 vial 10  . INSULIN GLARGINE 100 UNIT/ML [place] SOLN Subcutaneous Inject 15 Units into the skin daily. 10 mL 10  . OXYCODONE HCL ER 15 MG PO TB12 Oral Take 1 tablet (15 mg total) by mouth every 12 (twelve) hours. 60 tablet 0  . ROSUVASTATIN CALCIUM 20 MG PO TABS Oral Take 1 tablet (20 mg total) by mouth daily at 6 PM. 30 tablet 0    BP 129/62  Pulse 79  Temp(Src) 98.3 F (36.8 C) (Oral)  Resp 16  Ht 5\' 7"  (1.702 m)  Wt 180 lb (81.647 kg)  BMI 28.19 kg/m2  SpO2 98%  Physical Exam  ED Course  Procedures (including critical care time)  Labs Reviewed  CBC - Abnormal; Notable for the following:    RBC 4.06 (*)    Hemoglobin 10.4 (*)    HCT 33.2 (*)    MCH 25.6 (*)    RDW 16.5 (*)    All other components within normal limits  BASIC METABOLIC PANEL - Abnormal; Notable for the following:    Glucose, Bld 273 (*)    GFR calc non Af Amer 80 (*)    All other components within normal limits  URINALYSIS, ROUTINE W REFLEX MICROSCOPIC - Abnormal; Notable for the following:    Glucose, UA 250 (*)    All other components within normal limits  GLUCOSE, CAPILLARY - Abnormal; Notable for the following:    Glucose-Capillary 159 (*)    All other components within normal limits  POCT CBG MONITORING  POCT CBG MONITORING   No results found.   1. Generalized weakness   2. Physical deconditioning    9:04 PM change of shift: Handoff from Dr. Juleen China. Awaiting urine tests. If it shows infection will treat. Will discharge  after urine returns.  10:22 PM Urine test neg, pt informed. Will d/c.   6:18 AM Patient unable to be discharged because his family is unable to take care of him at home. Patient does not want to go back to the rehabilitation facility he was brought from. Patient discussed with Dr. Hyacinth Meeker who has spoken with the patient's family. Will ask for social work consult in the morning to attempt to place the patient in the another rehabilitation facility. Patient has remained stable overnight. Sliding scale insulin order placed.  MDM          Carolee Rota, PA 11/12/11 2223  Carolee Rota, [place] 11/13/11 [phone]

## 2011-11-12 NOTE — ED Notes (Signed)
Lab at bedside to collect blood specimens.

## 2011-11-12 NOTE — ED Notes (Signed)
Patient currently resting quietly in bed; no respiratory or acute distress noted.  Patient updated on plan of care; informed patient that we are currently waiting for urinalysis results to come back.  Patient has no other questions or concerns at this time.  Will continue to monitor.

## 2011-11-12 NOTE — ED Provider Notes (Signed)
History    55yM with multiple complaints. Recent diabetic ulcer RLE with subsequent BKA. Prolonged, complicated hospital course. Just discharged to rehab facility. Presenting today because he feels tired. Doesn't like care at facility. "They left me sitting in my own sewage for an hour and a half." Reports feels constipated although had BM today. Pain improving at site of amp. No fever or chills. No urinary complaints. No HA. No n/v.  CSN: [id number]  Arrival date & time 11/12/11  1549   First MD Initiated Contact with Patient 11/12/11 1908      Chief Complaint  Patient presents with  . Fatigue    (Consider location/radiation/quality/duration/timing/severity/associated sxs/prior treatment) HPI  Past Medical History  Diagnosis Date  . CAD (coronary artery disease), autologous vein bypass graft     s/p CABG in 80's or 90's, then cath x2, last in the 90's with DES and BMS placed but not sure where. Dr[facility] Lupita [facility]utter at [facility]   . HTN (hypertension)   . 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 Diabetes mellitus   . Anxiety   . Headache   . Atrial flutter with rapid ventricular response 10/15/2011  . NSTEMI (non-ST elevated myocardial infarction) 10/17/2011    Past Surgical History  Procedure Date  . Coronary artery bypass graft   . No past surgeries   . Amputation 10/17/2011    Procedure: AMPUTATION RAY;  Surgeon: Cammy Copa;  Location: [facility] OR;  Service: Orthopedics;  Laterality: Right;  First and Second Ray Amputation  . Amputation 11/01/2011    Procedure: AMPUTATION BELOW KNEE;  Surgeon: Nadara Mustard, MD;  Location: [facility] OR;  Service: Orthopedics;  Laterality: Right;  Right Below Knee Amputation    Family History  Problem Relation Age of Onset  . Alzheimer's disease Father   . Melanoma Father   . Benign prostatic hyperplasia Father   . Coronary artery disease Father     s/p CABG  . Heart attack  Brother   . Coronary artery disease Brother     s/p CABG  . Lymphoma Sister     non Hodgkin's   . Aortic aneurysm Mother   . Anemia Mother     History  Substance Use Topics  . Smoking status: Former Smoker -- 0.5 packs/day for 35 years    Types: Cigarettes    Quit date: 07/03/2011  . Smokeless tobacco: Not on file  . Alcohol Use: No      Review of Systems   Review of symptoms negative unless otherwise noted in HPI.  Allergies  Review of patient's allergies indicates no known allergies.  Home Medications   Current Outpatient Rx  Name Route Sig Dispense Refill  . ALPRAZOLAM 1 MG PO TABS Oral Take 1 mg by mouth at bedtime as needed. anxiety     . ASPIRIN 81 MG PO TBEC Oral Take 1 tablet (81 mg total) by mouth daily. Swallow whole. 30 tablet 12  . DSS 100 MG PO CAPS Oral Take 100 mg by mouth 2 (two) times daily. 30 capsule 0  . INSULIN ASPART 100 UNIT/ML [place] SOLN Subcutaneous Inject 0-15 Units into the skin 3 (three) times daily with meals. 1 vial 10  . INSULIN GLARGINE 100 UNIT/ML [place] SOLN Subcutaneous Inject 15 Units into the skin daily. 10 mL 10  . OXYCODONE HCL ER 15 MG PO TB12 Oral Take 1 tablet (15 mg total) by mouth every  12 (twelve) hours. 60 tablet 0  . ROSUVASTATIN CALCIUM 20 MG PO TABS Oral Take 1 tablet (20 mg total) by mouth daily at 6 PM. 30 tablet 0    BP 129/62  Pulse 79  Temp(Src) 98.3 F (36.8 C) (Oral)  Resp 16  Ht 5\' 7"  (1.702 m)  Wt 180 lb (81.647 kg)  BMI 28.19 kg/m2  SpO2 98%  Physical Exam  Nursing note and vitals reviewed. Constitutional: He appears well-developed and well-nourished. No distress.  HENT:  Head: Normocephalic and atraumatic.  Eyes: Conjunctivae are normal. Pupils are equal, round, and reactive to light. Right eye exhibits no discharge. Left eye exhibits no discharge.  Neck: Neck supple.  Cardiovascular: Normal rate, regular rhythm and normal heart sounds.  Exam reveals no gallop and no friction rub.   No murmur  heard. Pulmonary/Chest: Effort normal and breath sounds normal. No respiratory distress.  Abdominal: Soft. He exhibits no distension. There is no tenderness.  Musculoskeletal:       RLE BKA with clean bandages. No concerning proximal skin changes.  Neurological: He is alert.  Skin: Skin is warm and dry.  Psychiatric: His behavior is normal. Thought content normal.       Somewhat odd affect.    ED Course  Procedures (including critical care time)  Labs Reviewed  CBC - Abnormal; Notable for the following:    RBC 4.06 (*)    Hemoglobin 10.4 (*)    HCT 33.2 (*)    MCH 25.6 (*)    RDW 16.5 (*)    All other components within normal limits  BASIC METABOLIC PANEL - Abnormal; Notable for the following:    Glucose, Bld 273 (*)    GFR calc non Af Amer 80 (*)    All other components within normal limits  URINALYSIS, ROUTINE W REFLEX MICROSCOPIC   No results found.  EKG:  Rhythm: normal sinus Rate: 76 Axis: normal Intervals: QRS . Inferior q waves, more pronounced than previous comparison. ST segments: NS ST changes. t wave flattening anterolaterally. Resolution of t wave inversions in same distribution   1. Generalized weakness   2. Physical deconditioning       MDM  55yM with general fatigue. Consider infectious, anemia, lytes, other. Suspect deconditioning from extended hospitalization. Afebrile. Labs unremarkable aside from hyperglycemia. Pt has hx of DM. Mild anemia which is improved from previous. EKG NSR with more pronounced q wave inferiorly as compared to previous but had NSTEMI during hospitalization. No acute concerning findings. Afebrile and nontoxic appearing. Doubt infectious, although UA pending. Feel that can safely be DC'd back to rehab facility pending the results of UA.        Raeford Razor, MD 11/12/11 [phone]

## 2011-11-13 ENCOUNTER — Encounter (HOSPITAL_COMMUNITY): Payer: Self-pay | Admitting: *Deleted

## 2011-11-13 DIAGNOSIS — R531 Weakness: Secondary | ICD-10-CM | POA: Diagnosis present

## 2011-11-13 LAB — DIFFERENTIAL
Basophils Absolute: 0 10*3/uL (ref 0.0–0.1)
Basophils Relative: 1 % (ref 0–1)
Eosinophils Absolute: 0.2 10*3/uL (ref 0.0–0.7)
Monocytes Absolute: 0.5 10*3/uL (ref 0.1–1.0)
Monocytes Relative: 8 % (ref 3–12)
Neutro Abs: 3 10*3/uL (ref 1.7–7.7)
Neutrophils Relative %: 53 % (ref 43–77)

## 2011-11-13 LAB — COMPREHENSIVE METABOLIC PANEL
ALT: 18 U/L (ref 0–53)
BUN: 18 mg/dL (ref 6–23)
CO2: 30 mEq/L (ref 19–32)
Calcium: 9.3 mg/dL (ref 8.4–10.5)
GFR calc Af Amer: >90 mL/min (ref >90)
GFR calc non Af Amer: >90 mL/min (ref >90)
Glucose, Bld: 212 mg/dL — ABNORMAL HIGH (ref 70–99)
Sodium: 138 mEq/L (ref 135–145)
Total Protein: 7.5 g/dL (ref 6.0–8.3)

## 2011-11-13 LAB — GLUCOSE, CAPILLARY
Glucose-Capillary: 184 mg/dL — ABNORMAL HIGH (ref 70–99)
Glucose-Capillary: 175 mg/dL — ABNORMAL HIGH (ref 70–99)
Glucose-Capillary: 204 mg/dL — ABNORMAL HIGH (ref 70–99)

## 2011-11-13 LAB — MAGNESIUM: Magnesium: 1.9 mg/dL (ref 1.5–2.5)

## 2011-11-13 MED ORDER — ENOXAPARIN SODIUM 30 MG/0.3ML ~~LOC~~ SOLN: 30.0000 mg | SUBCUTANEOUS | Status: DC

## 2011-11-13 MED ORDER — INSULIN ASPART 100 UNIT/ML ~~LOC~~ SOLN
0.0000 [IU] | SUBCUTANEOUS | Status: AC
  Administered 2011-11-13: 4 [IU] via SUBCUTANEOUS
  Filled 2011-11-13: qty 1

## 2011-11-13 MED ORDER — INFLUENZA VIRUS VACC SPLIT PF IM SUSP
0.5000 mL | INTRAMUSCULAR | Status: AC
  Administered 2011-11-14: 0.5 mL via INTRAMUSCULAR
  Filled 2011-11-13: qty 0.5

## 2011-11-13 MED ORDER — ALPRAZOLAM 0.5 MG PO TABS
1.0000 mg | ORAL_TABLET | Freq: Every evening | ORAL | Status: DC | PRN
  Administered 2011-11-14 – 2011-11-17 (×2): 1 mg via ORAL
  Filled 2011-11-13: qty 1
  Filled 2011-11-13: qty 2

## 2011-11-13 MED ORDER — SODIUM CHLORIDE 0.9 % IV SOLN: INTRAVENOUS | Status: DC

## 2011-11-13 MED ORDER — INSULIN ASPART 100 UNIT/ML ~~LOC~~ SOLN
0.0000 [IU] | SUBCUTANEOUS | Status: DC
  Administered 2011-11-13: 4 [IU] via SUBCUTANEOUS
  Administered 2011-11-13: 8 [IU] via SUBCUTANEOUS
  Administered 2011-11-14: 12 [IU] via SUBCUTANEOUS
  Administered 2011-11-14: 4 [IU] via SUBCUTANEOUS
  Administered 2011-11-14: 12 [IU] via SUBCUTANEOUS
  Filled 2011-11-13: qty 3

## 2011-11-13 MED ORDER — ASPIRIN 81 MG PO TBEC: 81.0000 mg | DELAYED_RELEASE_TABLET | Freq: Every day | ORAL | Status: DC

## 2011-11-13 MED ORDER — ASPIRIN EC 81 MG PO TBEC
81.0000 mg | DELAYED_RELEASE_TABLET | Freq: Every day | ORAL | Status: DC
  Administered 2011-11-13 – 2011-11-17 (×5): 81 mg via ORAL
  Filled 2011-11-13 (×7): qty 1

## 2011-11-13 MED ORDER — ALPRAZOLAM 0.5 MG PO TABS
1.0000 mg | ORAL_TABLET | Freq: Once | ORAL | Status: AC
  Administered 2011-11-13: 1 mg via ORAL
  Filled 2011-11-13: qty 2

## 2011-11-13 MED ORDER — DOCUSATE SODIUM 100 MG PO CAPS
100.0000 mg | ORAL_CAPSULE | Freq: Two times a day (BID) | ORAL | Status: DC
  Administered 2011-11-13 – 2011-11-17 (×8): 100 mg via ORAL
  Filled 2011-11-13 (×9): qty 1

## 2011-11-13 MED ORDER — ROSUVASTATIN CALCIUM 20 MG PO TABS
20.0000 mg | ORAL_TABLET | Freq: Every day | ORAL | Status: DC
  Administered 2011-11-13 – 2011-11-16 (×4): 20 mg via ORAL
  Filled 2011-11-13 (×6): qty 1

## 2011-11-13 MED ORDER — OXYCODONE HCL 15 MG PO TB12: 15.0000 mg | ORAL_TABLET | Freq: Two times a day (BID) | ORAL | Status: DC

## 2011-11-13 MED ORDER — INSULIN GLARGINE 100 UNIT/ML ~~LOC~~ SOLN
15.0000 [IU] | Freq: Every day | SUBCUTANEOUS | Status: DC
  Administered 2011-11-14: 15 [IU] via SUBCUTANEOUS
  Filled 2011-11-13: qty 3

## 2011-11-13 MED ORDER — POLYETHYLENE GLYCOL 3350 17 G PO PACK
17.0000 g | PACK | Freq: Every day | ORAL | Status: DC
  Administered 2011-11-14 – 2011-11-17 (×3): 17 g via ORAL
  Filled 2011-11-13 (×6): qty 1

## 2011-11-13 MED ORDER — WHITE PETROLATUM GEL
Status: AC
  Administered 2011-11-13: 21:00:00 via ORAL
  Filled 2011-11-13: qty 5

## 2011-11-13 MED ORDER — OXYCODONE HCL 15 MG PO TB12
15.0000 mg | ORAL_TABLET | Freq: Two times a day (BID) | ORAL | Status: DC
  Administered 2011-11-13 – 2011-11-17 (×9): 15 mg via ORAL
  Filled 2011-11-13 (×9): qty 1

## 2011-11-13 MED ORDER — HYDROCODONE-ACETAMINOPHEN 5-325 MG PO TABS: 1.0000 | ORAL_TABLET | Freq: Once | ORAL | Status: DC
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
## 2011-11-13 NOTE — ED Provider Notes (Addendum)
Pt signed out to me that pt is awaiting SW eval for possible placement. Pt awake and alert, content, eating breakfast. Discussed possible dispo options with pt including home with home health services, walker, bedside commode, etc, vs looking for possible rehab/ecf - SW to facilitate decision and discharge planning. If unable to be placed from ed, will look at alternate plan.   SW has seen, indicates pt unable to be placed into ecf for the next several days and no one able to assist/care for patient in home setting - rec admit.  Pt just recently d/cd from triad service - will call to admit for gen weakness/deconditioning, and placement.    Suzi Roots, MD 11/13/11 [id number]  Suzi Roots, MD 11/13/11 [phone]

## 2011-11-13 NOTE — ED Notes (Signed)
Pt is awaiting Child psychotherapist for possible placement.  Pt with recent right BKA and wrapped with kerlex.  BS. 175.  ptis alert and talking and tray at bedside.

## 2011-11-13 NOTE — ED Notes (Signed)
PT CBG 184

## 2011-11-13 NOTE — ED Notes (Signed)
Meal tray ordered 

## 2011-11-13 NOTE — H&P (Signed)
PCP:  [name], MD   DOA:  11/12/2011  4:15 PM  Chief Complaint:  weakness  HPI: 55 year old male with history of DM, HTN, CAD, anxiety, dyslipidemia presented to ED from SNF after feeling generalized weakness associated with nonproductive cough lasting for few days prior to admission. Patient reported he was not taken care well in nursing home and wanted to be admitted to hospital for evaluation and placement to different SNF as he cannot take care of himself. No complaints of chest pain or shortness of breath, no nausea or vomiting, no blood in stool or urine. No lightheadedness or dizziness, no loss of consciousness.  Allergies: No Known Allergies  Prior to Admission medications   Medication Sig Start Date End Date Taking? Authorizing Provider  ALPRAZolam Prudy Feeler) 1 MG tablet Take 1 mg by mouth at bedtime as needed. anxiety  11/09/11  Yes Nayana Abrol  aspirin (ASPIRIN EC) 81 MG EC tablet Take 1 tablet (81 mg total) by mouth daily. Swallow whole. 11/09/11 11/08/12 Yes Nayana Abrol  docusate sodium 100 MG CAPS Take 100 mg by mouth 2 (two) times daily. 11/09/11 11/19/11 Yes Nayana Abrol  insulin aspart (NOVOLOG) 100 UNIT/ML injection Inject 0-15 Units into the skin 3 (three) times daily with meals. 11/09/11 11/08/12 Yes Nayana Abrol  insulin glargine (LANTUS) 100 UNIT/ML injection Inject 15 Units into the skin daily. 11/09/11 11/08/12 Yes Nayana Abrol  oxyCODONE (OXYCONTIN) 15 MG TB12 Take 1 tablet (15 mg total) by mouth every 12 (twelve) hours. 11/10/11  Yes Michelle A. Matthews  rosuvastatin (CRESTOR) 20 MG tablet Take 1 tablet (20 mg total) by mouth daily at 6 PM. 11/09/11 11/08/12 Yes Richarda Overlie    Past Medical History  Diagnosis Date  . CAD (coronary artery disease), autologous vein bypass graft     s/p CABG in 80's or 90's, then cath x2, last in the 90's with DES and BMS placed but not sure where. Dr. Lupita Shutter at [facility]   . HTN (hypertension)   . Diabetic foot ulcers 10/2011      bilateral plantar 1st MTP ulcers, with deep tissue infection right foot 10/2011   . Anxiety   . Headache   . Atrial flutter with rapid ventricular response 10/15/2011  . NSTEMI (non-ST elevated myocardial infarction) 10/17/2011  . Diabetes mellitus     Past Surgical History  Procedure Date  . Coronary artery bypass graft   . No past surgeries   . Amputation 10/17/2011    Procedure: AMPUTATION RAY;  Surgeon: Cammy Copa;  Location: [facility] OR;  Service: Orthopedics;  Laterality: Right;  First and Second Ray Amputation  . Amputation 11/01/2011    Procedure: AMPUTATION BELOW KNEE;  Surgeon: Nadara Mustard, MD;  Location: [facility] OR;  Service: Orthopedics;  Laterality: Right;  Right Below Knee Amputation  . Rt bka     Social History:  reports that he quit smoking about 10 years ago. His smoking use included Cigarettes. He has a 17.5 pack-year smoking history. He has never used smokeless tobacco. He reports that he does not drink alcohol or use illicit drugs.  Family History  Problem Relation Age of Onset  . Alzheimer's disease Father   . Melanoma Father   . Benign prostatic hyperplasia Father   . Coronary artery disease Father     s/p CABG  . Heart attack Brother   . Coronary artery disease Brother     s/p CABG  . Lymphoma Sister     non Hodgkin's   .  Aortic aneurysm Mother   . Anemia Mother     Review of Systems:  Constitutional: Denies fever, chills, diaphoresis, appetite change and fatigue.  HEENT: Denies photophobia, eye pain, redness, hearing loss, ear pain, congestion, sore throat, rhinorrhea, sneezing, mouth sores, trouble swallowing, neck pain, neck stiffness and tinnitus.   Respiratory: Denies SOB, DOE, cough, chest tightness,  and wheezing.   Cardiovascular: Denies chest pain, palpitations and leg swelling.  Gastrointestinal: Denies nausea, vomiting, abdominal pain, diarrhea, constipation, blood in stool and abdominal distention.  Genitourinary: Denies dysuria, urgency,  frequency, hematuria, flank pain and difficulty urinating.  Musculoskeletal: Denies myalgias, back pain, joint swelling, arthralgias and gait problem.  Skin: Denies pallor, rash and wound.  Neurological: Denies dizziness, seizures, syncope, weakness, light-headedness, numbness and headaches.  Hematological: Denies adenopathy. Easy bruising, personal or family bleeding history  Psychiatric/Behavioral: Denies suicidal ideation, mood changes, confusion, nervousness, sleep disturbance and agitation   Physical Exam:  Filed Vitals:   11/13/11 0838 11/13/11 1050 11/13/11 1121 11/13/11 1423  BP: 131/71 146/67 157/80 126/69  Pulse: 81 74 74 110  Temp:  97.1 F (36.2 C) 97.3 F (36.3 C) 97.9 F (36.6 C)  TempSrc:  Oral Oral Oral  Resp: 18 18 18 18   Height:      Weight:      SpO2: 100% 100% 99% 100%    Constitutional: Vital signs reviewed.  Patient is a well-developed and well-nourished in no acute distress and cooperative with exam. Alert and oriented x3.  Head: Normocephalic and atraumatic Ear: TM normal bilaterally Mouth: no erythema or exudates, MMM Eyes: PERRL, EOMI, conjunctivae normal, No scleral icterus.  Neck: Supple, Trachea midline normal ROM, No JVD, mass, thyromegaly, or carotid bruit present.  Cardiovascular: RRR, S1 normal, S2 normal, no MRG, pulses symmetric and intact bilaterally Pulmonary/Chest: CTAB, no wheezes, rales, or rhonchi Abdominal: Soft. Non-tender, non-distended, bowel sounds are normal, no masses, organomegaly, or guarding present.  GU: no CVA tenderness Musculoskeletal: No joint deformities, erythema, or stiffness, ROM full and no nontender Ext: no edema and no cyanosis, pulses palpable bilaterally (DP and PT) Hematology: no cervical, inginal, or axillary adenopathy.  Neurological: A&O x3, Strenght is normal and symmetric bilaterally, cranial nerve II-XII are grossly intact, no focal motor deficit, sensory intact to light touch bilaterally.  Skin: Warm, dry  and intact. No rash, cyanosis, or clubbing.  Psychiatric: Normal mood and affect. speech and behavior is normal. Judgment and thought content normal. Cognition and memory are normal.   Labs on Admission:  Results for orders placed during the hospital encounter of 11/12/11 (from the past 48 hour(s))  CBC     Status: Abnormal   Collection Time   11/12/11  7:14 PM      Component Value Range Comment   WBC 7.3  4.0 - 10.5 (K/uL)    RBC 4.06 (*) 4.22 - 5.81 (MIL/uL)    Hemoglobin 10.4 (*) 13.0 - 17.0 (g/dL)    HCT 16.1 (*) 09.6 - 52.0 (%)    MCV 81.8  78.0 - 100.0 (fL)    MCH 25.6 (*) 26.0 - 34.0 (pg)    MCHC 31.3  30.0 - 36.0 (g/dL)    RDW 04.5 (*) 40.9 - 15.5 (%)    Platelets 191  150 - 400 (K/uL)   BASIC METABOLIC PANEL     Status: Abnormal   Collection Time   11/12/11  7:14 PM      Component Value Range Comment   Sodium 138  135 - 145 (mEq/L)  Potassium 4.1  3.5 - 5.1 (mEq/L)    Chloride 100  96 - 112 (mEq/L)    CO2 29  19 - 32 (mEq/L)    Glucose, Bld 273 (*) 70 - 99 (mg/dL)    BUN 20  6 - 23 (mg/dL)    Creatinine, Ser 8.11  0.50 - 1.35 (mg/dL)    Calcium 9.6  8.4 - 10.5 (mg/dL)    GFR calc non Af Amer 80 (*) >90 (mL/min)    GFR calc Af Amer >90  >90 (mL/min)   URINALYSIS, ROUTINE W REFLEX MICROSCOPIC     Status: Abnormal   Collection Time   11/12/11  8:59 PM      Component Value Range Comment   Color, Urine YELLOW  YELLOW     APPearance CLEAR  CLEAR     Specific Gravity, Urine 1.019  1.005 - 1.030     pH 6.0  5.0 - 8.0     Glucose, UA 250 (*) NEGATIVE (mg/dL)    Hgb urine dipstick NEGATIVE  NEGATIVE     Bilirubin Urine NEGATIVE  NEGATIVE     Ketones, ur NEGATIVE  NEGATIVE (mg/dL)    Protein, ur NEGATIVE  NEGATIVE (mg/dL)    Urobilinogen, UA 0.2  0.0 - 1.0 (mg/dL)    Nitrite NEGATIVE  NEGATIVE     Leukocytes, UA NEGATIVE  NEGATIVE  MICROSCOPIC NOT DONE ON URINES WITH NEGATIVE PROTEIN, BLOOD, LEUKOCYTES, NITRITE, OR GLUCOSE <1000 mg/dL.  GLUCOSE, CAPILLARY     Status:  Abnormal   Collection Time   11/12/11 11:01 PM      Component Value Range Comment   Glucose-Capillary 159 (*) 70 - 99 (mg/dL)   GLUCOSE, CAPILLARY     Status: Abnormal   Collection Time   11/13/11  5:55 AM      Component Value Range Comment   Glucose-Capillary 184 (*) 70 - 99 (mg/dL)   GLUCOSE, CAPILLARY     Status: Abnormal   Collection Time   11/13/11  7:25 AM      Component Value Range Comment   Glucose-Capillary 175 (*) 70 - 99 (mg/dL)    Comment 1 Documented in Chart      Comment 2 Notify RN     COMPREHENSIVE METABOLIC PANEL     Status: Abnormal   Collection Time   11/13/11 10:37 AM      Component Value Range Comment   Sodium 138  135 - 145 (mEq/L)    Potassium 4.0  3.5 - 5.1 (mEq/L)    Chloride 98  96 - 112 (mEq/L)    CO2 30  19 - 32 (mEq/L)    Glucose, Bld 212 (*) 70 - 99 (mg/dL)    BUN 18  6 - 23 (mg/dL)    Creatinine, Ser 9.14  0.50 - 1.35 (mg/dL)    Calcium 9.3  8.4 - 10.5 (mg/dL)    Total Protein 7.5  6.0 - 8.3 (g/dL)    Albumin 2.8 (*) 3.5 - 5.2 (g/dL)    AST 20  0 - 37 (U/L)    ALT 18  0 - 53 (U/L)    Alkaline Phosphatase 70  39 - 117 (U/L)    Total Bilirubin 0.3  0.3 - 1.2 (mg/dL)    GFR calc non Af Amer >90  >90 (mL/min)    GFR calc Af Amer >90  >90 (mL/min)   MAGNESIUM     Status: Normal   Collection Time   11/13/11 10:37 AM  Component Value Range Comment   Magnesium 1.9  1.5 - 2.5 (mg/dL)   PHOSPHORUS     Status: Normal   Collection Time   11/13/11 10:37 AM      Component Value Range Comment   Phosphorus 3.8  2.3 - 4.6 (mg/dL)   DIFFERENTIAL     Status: Normal   Collection Time   11/13/11 10:37 AM      Component Value Range Comment   Neutrophils Relative 53  43 - 77 (%)    Neutro Abs 3.0  1.7 - 7.7 (K/uL)    Lymphocytes Relative 34  12 - 46 (%)    Lymphs Abs 1.9  0.7 - 4.0 (K/uL)    Monocytes Relative 8  3 - 12 (%)    Monocytes Absolute 0.5  0.1 - 1.0 (K/uL)    Eosinophils Relative 4  0 - 5 (%)    Eosinophils Absolute 0.2  0.0 - 0.7  (K/uL)    Basophils Relative 1  0 - 1 (%)    Basophils Absolute 0.0  0.0 - 0.1 (K/uL)   GLUCOSE, CAPILLARY     Status: Abnormal   Collection Time   11/13/11 11:38 AM      Component Value Range Comment   Glucose-Capillary 224 (*) 70 - 99 (mg/dL)     Radiological Exams on Admission: No results found.   Assessment/Plan:  Principal Problem:   *Weakness generalized - perhaps secondary to influenza virus verus community acquired pneumonia - follow up flu test result  Active Problems:   Diabetes mellitus - CBG monitoring - restart home medications   EDUCATION - test results and diagnostic studies were discussed with patient and pt's family who was present at the bedside - patient and family have verbalized the understanding - questions were answered at the bedside and contact information was provided for additional questions or concerns   Time Spent on Admission: Greater than 30 minutes  Dajane Valli 11/13/2011, 4:05 PM

## 2011-11-13 NOTE — ED Notes (Signed)
Social work is coming down to see patient

## 2011-11-13 NOTE — ED Notes (Signed)
Elevated pt's stump on pillow.

## 2011-11-13 NOTE — ED Provider Notes (Signed)
Medical screening examination/treatment/procedure(s) were conducted as a shared visit with non-physician practitioner(s) and myself.  I personally evaluated the patient during the encounter.  Please see completed H&P for this encounter.  Raeford Razor, MD 11/13/11 [phone]

## 2011-11-13 NOTE — Progress Notes (Signed)
   CARE MANAGEMENT NOTE 11/13/2011  Patient:  Bouse,Olof   Account Number:  [id number]  Date Initiated:  10/16/2011  Documentation initiated by:  Zakaree Mcclenahan  Subjective/Objective Assessment:   55 yr-old male adm with (B) foot ulcers, hyperglycemia, AFlutter w/RVR; lives with family.   Anticipated DC Date:  11/10/2011   Anticipated DC Plan:  SKILLED NURSING FACILITY  In-house referral  Financial Counselor  Clinical Social Worker      DC Planning Services  CM consult  [facility]  Follow-up appt scheduled      Choice offered to / List presented to:          [facility] arranged  HH-1 RN  HH-2 PT  HH-3 OT      [facility] agency  [facility]   Status of service:  Completed, signed off  Discharge Disposition:  SKILLED NURSING FACILITY  Per UR Regulation:  Reviewed for med. necessity/level of care/duration of stay  Comments:  Contact:  Oliver Barre, niece  #[phone]  11/09/11 1050 Zionna Homewood RN MSN CCM CIR no longer an option for pt, he has decided on SNF for rehab.  CSW notified.  12/26 phy ther rec snf, cir still following but may not take since has no fam support, debbie dowell rn,bsn 12/24 pt not sure if he wants cir vs snf vs hhc, his brother is not helpful at home, his dad has been placed, barbara boyeete from inpt rehab following, alerted [facility] may need hhc-[facility] homecare is 2 weeks behind on phy ther, pt is self pay. [place] [phone] 12/20 spoke w pt, his brother and niece will assist after dc from hosp, have asked md to place cir consult, ortho surg had suggested [facility] for hhc but [facility] cannot accept anymore self pay pt's, pt agreeable to [facility] for hhc if needed, debbie dowell rn,bsn 11/01/11 0800 Verdis Prime RN MSN CCM Pt scheduled for BKA today. 12/14 pt [facility], appt on 11/13/11 at 9am w dr Gretel Acre yu at [address], [place], [facility]. debbie dowell rn,bsn 12/10 has hhc agency  list for [facility], community prescription care and insulin pt assist for to pt also. debbie dowell rn,bsn 10/16/11 1340 Shavawn Stobaugh RN MSN CCM Pt is primary caregiver for father who has alzheimer's, is currently applying for disability.  Has no PCP, provided information/application packet for [facility].
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
## 2011-11-13 NOTE — ED Notes (Signed)
Social worker in ED to assess patient status on plan of care

## 2011-11-13 NOTE — ED Notes (Signed)
PT changed and repositioned

## 2011-11-13 NOTE — ED Notes (Signed)
[name] [phone]) his sister in law Oliver Barre [phone]) his niece

## 2011-11-14 LAB — BASIC METABOLIC PANEL
Chloride: 98 mEq/L (ref 96–112)
GFR calc Af Amer: >90 mL/min (ref >90)
GFR calc non Af Amer: >90 mL/min (ref >90)
Potassium: 4 mEq/L (ref 3.5–5.1)
Sodium: 139 mEq/L (ref 135–145)

## 2011-11-14 LAB — GLUCOSE, CAPILLARY
Glucose-Capillary: 189 mg/dL — ABNORMAL HIGH (ref 70–99)
Glucose-Capillary: 251 mg/dL — ABNORMAL HIGH (ref 70–99)
Glucose-Capillary: 59 mg/dL — ABNORMAL LOW (ref 70–99)

## 2011-11-14 LAB — INFLUENZA PANEL BY PCR (TYPE A & B)
H1N1 flu by pcr: NOT DETECTED
Influenza B By PCR: NEGATIVE

## 2011-11-14 LAB — CBC
HCT: 36.7 % — ABNORMAL LOW (ref 39.0–52.0)
Hemoglobin: 11.7 g/dL — ABNORMAL LOW (ref 13.0–17.0)
RBC: 4.55 MIL/uL (ref 4.22–5.81)
WBC: 8.5 10*3/uL (ref 4.0–10.5)
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
## 2011-11-14 NOTE — Progress Notes (Signed)
Patient ID: [name], male   DOB: June 11, 1956, 56 y.o.   MRN: [id number] Subjective: No events overnight. Patient denies chest pain, shortness of breath, abdominal pain.   Objective:  Vital signs in last 24 hours:  Filed Vitals:   11/13/11 1423 11/13/11 2200 11/14/11 0600 11/14/11 1454  BP: 126/69 135/86 126/78 137/78  Pulse: 110 112 80 82  Temp: 97.9 F (36.6 C) 97.2 F (36.2 C) 97.7 F (36.5 C) 98.7 F (37.1 C)  TempSrc: Oral Oral  Oral  Resp: 18 20 18 18   Height:      Weight:      SpO2: 100% 96% 100% 100%    Intake/Output from previous day:   Intake/Output Summary (Last 24 hours) at 11/14/11 1547 Last data filed at 11/14/11 0700  Gross per 24 hour  Intake      0 ml  Output    750 ml  Net   -750 ml    Physical Exam: General: Alert, awake, oriented x3, in no acute distress. HEENT: No bruits, no goiter. Moist mucous membranes, no scleral icterus, no conjunctival pallor. Heart: Regular rate and rhythm, S1/S2 +, no murmurs, rubs, gallops. Lungs: Clear to auscultation bilaterally. No wheezing, no rhonchi, no rales.  Abdomen: Soft, nontender, nondistended, positive bowel sounds. Extremities: right BKA Neuro: Grossly nonfocal.  Lab Results:  Basic Metabolic Panel:    Component Value Date/Time   NA 139 11/14/2011 0750   K 4.0 11/14/2011 0750   CL 98 11/14/2011 0750   CO2 29 11/14/2011 0750   BUN 17 11/14/2011 0750   CREATININE 0.95 11/14/2011 0750   GLUCOSE 252* 11/14/2011 0750   CALCIUM 9.5 11/14/2011 0750   CBC:    Component Value Date/Time   WBC 8.5 11/14/2011 0750   HGB 11.7* 11/14/2011 0750   HCT 36.7* 11/14/2011 0750   PLT 248 11/14/2011 0750   MCV 80.7 11/14/2011 0750   NEUTROABS 3.0 11/13/2011 1037   LYMPHSABS 1.9 11/13/2011 1037   MONOABS 0.5 11/13/2011 1037   EOSABS 0.2 11/13/2011 1037   BASOSABS 0.0 11/13/2011 1037      Lab 11/14/11 0750 11/13/11 1037 11/12/11 1914  WBC 8.5 -- 7.3  HGB 11.7* -- 10.4*  HCT 36.7* -- 33.2*  PLT 248 -- 191  MCV 80.7 -- 81.8    MCH 25.7* -- 25.6*  MCHC 31.9 -- 31.3  RDW 16.7* -- 16.5*  LYMPHSABS -- 1.9 --  MONOABS -- 0.5 --  EOSABS -- 0.2 --  BASOSABS -- 0.0 --  BANDABS -- -- --    Lab 11/14/11 0750 11/13/11 1037 11/12/11 1914  NA 139 138 138  K 4.0 4.0 4.1  CL 98 98 100  CO2 29 30 29   GLUCOSE 252* 212* 273*  BUN 17 18 20   CREATININE 0.95 0.80 1.02  CALCIUM 9.5 9.3 9.6  MG -- 1.9 --   No results found for this basename: INR:5,PROTIME:5 in the last 168 hours Cardiac markers: No results found for this basename: CK:3,CKMB:3,TROPONINI:3,MYOGLOBIN:3 in the last 168 hours No components found with this basename: POCBNP:3 No results found for this or any previous visit (from the past 240 hour(s)).  Studies/Results: No results found.  Medications: Scheduled Meds:   . aspirin EC  81 mg Oral Daily  . docusate sodium  100 mg Oral BID  . influenza  inactive virus vaccine  0.5 mL Intramuscular Tomorrow-1000  . insulin aspart  0-24 Units Subcutaneous Q4H  . insulin glargine  15 Units Subcutaneous Daily  . oxyCODONE  15 mg Oral Q12H  . polyethylene glycol  17 g Oral Daily  . rosuvastatin  20 mg Oral q1800  . white petrolatum       Continuous Infusions:   . sodium chloride     PRN Meds:.ALPRAZolam  Assessment/Plan:   Principal Problem:   *Weakness generalized  - perhaps secondary to dehydration - patient feels good at present - tolerates regular diet - PT evaluation follow up as patient came from SNF but came back because thought he has received inadequate care and needs new placement  Active Problems:   Diabetes mellitus  - CBG monitoring  - restart home medications   EDUCATION  - test results and diagnostic studies were discussed with patient at the bedside  - patient has verbalized the understanding  - questions were answered at the bedside and contact information was provided for additional questions or concerns   LOS: 2 days   [name] 11/14/2011, 3:47 PM  [facility] Pager: [phone]

## 2011-11-15 ENCOUNTER — Inpatient Hospital Stay (HOSPITAL_COMMUNITY): Payer: Self-pay

## 2011-11-15 DIAGNOSIS — Z89519 Acquired absence of unspecified leg below knee: Secondary | ICD-10-CM

## 2011-11-15 LAB — GLUCOSE, CAPILLARY
Glucose-Capillary: 60 mg/dL — ABNORMAL LOW (ref 70–99)
Glucose-Capillary: 60 mg/dL — ABNORMAL LOW (ref 70–99)
Glucose-Capillary: 163 mg/dL — ABNORMAL HIGH (ref 70–99)
Glucose-Capillary: 165 mg/dL — ABNORMAL HIGH (ref 70–99)
Glucose-Capillary: 178 mg/dL — ABNORMAL HIGH (ref 70–99)

## 2011-11-15 MED ORDER — INSULIN GLARGINE 100 UNIT/ML ~~LOC~~ SOLN
5.0000 [IU] | Freq: Every day | SUBCUTANEOUS | Status: DC
  Administered 2011-11-15 – 2011-11-17 (×3): 5 [IU] via SUBCUTANEOUS
  Filled 2011-11-15 (×2): qty 3

## 2011-11-15 MED ORDER — INSULIN ASPART 100 UNIT/ML ~~LOC~~ SOLN
0.0000 [IU] | Freq: Three times a day (TID) | SUBCUTANEOUS | Status: DC
  Administered 2011-11-15: 4 [IU] via SUBCUTANEOUS
  Filled 2011-11-15: qty 3

## 2011-11-15 MED ORDER — INSULIN ASPART 100 UNIT/ML ~~LOC~~ SOLN
0.0000 [IU] | Freq: Three times a day (TID) | SUBCUTANEOUS | Status: DC
  Administered 2011-11-15 – 2011-11-16 (×4): 2 [IU] via SUBCUTANEOUS
  Administered 2011-11-16 (×2): 4 [IU] via SUBCUTANEOUS
  Administered 2011-11-16: 1 [IU] via SUBCUTANEOUS
  Administered 2011-11-17: 4 [IU] via SUBCUTANEOUS
  Administered 2011-11-17: 2 [IU] via SUBCUTANEOUS
  Filled 2011-11-15 (×2): qty 3

## 2011-11-15 MED ORDER — SODIUM CHLORIDE 0.9 % IV SOLN
INTRAVENOUS | Status: AC
  Administered 2011-11-15: 11:00:00 via INTRAVENOUS

## 2011-11-15 MED ORDER — HEPARIN SODIUM (PORCINE) 5000 UNIT/ML IJ SOLN
5000.0000 [IU] | Freq: Three times a day (TID) | INTRAMUSCULAR | Status: DC
  Administered 2011-11-15 – 2011-11-17 (×7): 5000 [IU] via SUBCUTANEOUS
  Filled 2011-11-15 (×10): qty 1

## 2011-11-15 MED ORDER — CARVEDILOL 3.125 MG PO TABS
3.1250 mg | ORAL_TABLET | Freq: Two times a day (BID) | ORAL | Status: DC
  Administered 2011-11-15 – 2011-11-17 (×4): 3.125 mg via ORAL
  Filled 2011-11-15 (×8): qty 1

## 2011-11-15 MED ORDER — INSULIN GLARGINE 100 UNIT/ML ~~LOC~~ SOLN: 10.0000 [IU] | Freq: Every day | SUBCUTANEOUS | Status: DC

## 2011-11-15 NOTE — Progress Notes (Signed)
Pt's CBG level was 60 at 0147,orange juice given and CBG was repeated at 0212,read 60. 1 bottle of chocolate Ensure drink was given thereafter,CBG done again at 0259 read 156 and at 0400 read 187, NP Clydie Braun Kirby(on call)paged and notified at [phone] will review her insulin orders,will however continue to monitor Obasogie-Asidi, Rolly Magri Efe

## 2011-11-15 NOTE — Progress Notes (Signed)
[name] CSN:[id number],MRN:[id number] is a 56 y.o. male,  Outpatient Primary MD for the patient is Tyler Bigness, MD  Chief Complaint  Patient presents with  . Fatigue        Subjective:   [name] today has, No headache, No chest pain, No abdominal pain - No Nausea, No new weakness tingling or numbness, No Cough - SOB.    Objective:   Filed Vitals:   11/14/11 1454 11/14/11 2200 11/15/11 0522 11/15/11 1445  BP: 137/78 129/71 151/58 138/78  Pulse: 82 78 71 76  Temp: 98.7 F (37.1 C) 98.1 F (36.7 C) 97.7 F (36.5 C) 98.7 F (37.1 C)  TempSrc: Oral Oral Oral Oral  Resp: 18 20 20 18   Height:      Weight:      SpO2: 100% 97% 100% 97%    Wt Readings from Last 3 Encounters:  11/12/11 81.647 kg (180 lb)  11/04/11 76.8 kg (169 lb 5 oz)  11/04/11 76.8 kg (169 lb 5 oz)     Intake/Output Summary (Last 24 hours) at 11/15/11 1728 Last data filed at 11/15/11 1448  Gross per 24 hour  Intake      0 ml  Output   1350 ml  Net  -1350 ml    Exam Awake Alert, Oriented *3, No new F.N deficits, Normal affect [place].AT,PERRAL Supple Neck,No JVD, No cervical lymphadenopathy appriciated.  Symmetrical Chest wall movement, Good air movement bilaterally, CTAB RRR,No Gallops,Rubs or new Murmurs, No Parasternal Heave +ve B.Sounds, Abd Soft, Non tender, No organomegaly appriciated, No rebound -guarding or rigidity. No Cyanosis, Clubbing or edema, No new Rash or bruise , R.BKA  Data Review  CBC  Lab 11/14/11 0750 11/13/11 1037 11/12/11 1914  WBC 8.5 -- 7.3  HGB 11.7* -- 10.4*  HCT 36.7* -- 33.2*  PLT 248 -- 191  MCV 80.7 -- 81.8  MCH 25.7* -- 25.6*  MCHC 31.9 -- 31.3  RDW 16.7* -- 16.5*  LYMPHSABS -- 1.9 --  MONOABS -- 0.5 --  EOSABS -- 0.2 --  BASOSABS -- 0.0 --  BANDABS -- -- --    Chemistries   Lab 11/14/11 0750 11/13/11 1037 11/12/11 1914  NA 139 138 138  K 4.0 4.0 4.1  CL 98 98 100  CO2 29 30 29   GLUCOSE 252* 212* 273*  BUN 17 18 20   CREATININE 0.95 0.80 1.02    CALCIUM 9.5 9.3 9.6  MG -- 1.9 --  AST -- 20 --  ALT -- 18 --  ALKPHOS -- 70 --  BILITOT -- 0.3 --   ------------------------------------------------------------------------------------------------------------------ estimated creatinine clearance is 87.7 ml/min (by C-G formula based on Cr of 0.95). ------------------------------------------------------------------------------------------------------------------ No results found for this basename: HGBA1C:2 in the last 72 hours ------------------------------------------------------------------------------------------------------------------ No results found for this basename: CHOL:2,HDL:2,LDLCALC:2,TRIG:2,CHOLHDL:2,LDLDIRECT:2 in the last 72 hours ------------------------------------------------------------------------------------------------------------------ No results found for this basename: TSH,T4TOTAL,FREET3,T3FREE,THYROIDAB in the last 72 hours ------------------------------------------------------------------------------------------------------------------ No results found for this basename: VITAMINB12:2,FOLATE:2,FERRITIN:2,TIBC:2,IRON:2,RETICCTPCT:2 in the last 72 hours  Coagulation profile No results found for this basename: INR:5,PROTIME:5 in the last 168 hours  No results found for this basename: DDIMER:2 in the last 72 hours  Cardiac Enzymes No results found for this basename: CK:3,CKMB:3,TROPONINI:3,MYOGLOBIN:3 in the last 168 hours ------------------------------------------------------------------------------------------------------------------ No components found with this basename: POCBNP:3  Micro Results No results found for this or any previous visit (from the past 240 hour(s)).  Radiology Reports Mr Foot Right W Wo Contrast  10/21/2011  *RADIOLOGY REPORT*  Clinical Data: Osteomyelitis.  MRI OF THE RIGHT FOREFOOT  WITHOUT AND WITH CONTRAST  Technique:  Multiplanar, multisequence MR imaging was performed both before  and after administration of intravenous contrast.  Contrast: 15mL MULTIHANCE GADOBENATE DIMEGLUMINE 529 MG/ML IV SOLN  Comparison: MRI 10/15/2011.  Findings: Since the prior examination, the patient has undergone amputation at the level of the proximal diaphysis of the first and second metatarsals.  Soft tissues demonstrate extensive edema and enhancement.  There appears be a soft tissue wound over the dorsum of the foot.  There is a rim enhancing fluid collection originating from the medial margin of the residual base of the first metatarsal extending distally to the end of the medial aspect of the foot.  The collection also extends over the dorsum of the foot at the level of the mid metatarsals to the level of the fourth metatarsal.  The collection cannot be discretely measured but is approximately 4.7 cm long by up to 2.8 cm cranial-caudal by 2.5 cm transverse.  IMPRESSION:  1.  Status post amputation at the level the bases of the first and second metatarsals.  No evidence of osteomyelitis is identified. 2.  Persistent marked cellulitis with a rim enhancing fluid collection compatible with abscess.  As noted above, the collection extends from approximately the medial aspect of the base of the first metatarsal distally to the end of the medial aspect of the foot.  At the level of the mid metatarsals, the collection extends over the dorsum of the foot laterally to the level of the fourth metatarsal.  Original Report Authenticated By: Bernadene Bell. Maricela Curet, M.D.   Dg Chest Port 1 View  11/15/2011  *RADIOLOGY REPORT*  Clinical Data: Cough.  Weakness.  PORTABLE CHEST - 1 VIEW  Comparison: 10/15/2011 CT chest.  Findings: Cardiomegaly.  Post CABG.  Central pulmonary vascular prominence. No infiltrate, congestive heart failure or pneumothorax.  Calcified aorta.  IMPRESSION: Post CABG.  Cardiomegaly with central pulmonary vascular prominence.  Original Report Authenticated By: Fuller Canada, M.D.   Dg Foot Complete  Left  11/08/2011  *RADIOLOGY REPORT*  Clinical Data: Sore on the arch; diabetes  LEFT FOOT - COMPLETE 3+ VIEW  Comparison: October 14, 2011  Findings: There is no evidence of periosteal reaction or osseous erosion.  No fracture or dislocation.  The joint compartments are maintained.  No abnormal soft tissue gas is present.  Vascular calcifications are noted.  IMPRESSION: There is no evidence of acute abnormality.  Original Report Authenticated By: Brandon Melnick, M.D.    Scheduled Meds:   . aspirin EC  81 mg Oral Daily  . docusate sodium  100 mg Oral BID  . insulin aspart  0-24 Units Subcutaneous TID AC & HS  . insulin glargine  5 Units Subcutaneous Daily  . oxyCODONE  15 mg Oral Q12H  . polyethylene glycol  17 g Oral Daily  . rosuvastatin  20 mg Oral q1800  . DISCONTD: insulin aspart  0-24 Units Subcutaneous Q4H  . DISCONTD: insulin aspart  0-24 Units Subcutaneous TID AC & HS  . DISCONTD: insulin glargine  10 Units Subcutaneous Daily  . DISCONTD: insulin glargine  15 Units Subcutaneous Daily   Continuous Infusions:   . sodium chloride 50 mL/hr at 11/15/11 1030  . DISCONTD: sodium chloride     PRN Meds:.ALPRAZolam  Assessment & Plan   1. Weakness generalized - due to deconditioning - PT, placement.  2. Recent R.BKA- wound care, outpt follow with Dr Lajoyce Corners.  3. H/O CAD (coronary artery disease), autologous vein bypass graft -  pain free, on ASA-statin add low dose Coreg.  4. HTN (hypertension) - coreg added, monitor.  5. Diabetes mellitus 2 -  Low sugars last night, reduced lantus and ISS  6. H/O Atrial flutter with rapid ventricular response - Coreg, goal RC.   DVT Prophylaxis  Heparin   See all Orders from today for further details     Leroy Sea M.D on 11/15/2011 at 5:28 PM  [facility] Office  [phone]

## 2011-11-15 NOTE — Progress Notes (Signed)
Inpatient Diabetes Program Recommendations  AACE/ADA: New Consensus Statement on Inpatient Glycemic Control (2009)  Target Ranges:  Prepandial:   less than 140 mg/dL      Peak postprandial:   less than 180 mg/dL (1-2 hours)      Critically ill patients:  140 - 180 mg/dL   Reason for Visit: CBG down to 60 mg/dL early this morning after receiving 12 units of Novolog at 10 pm last night.  Note that correction scale schedule changed to tid with meals.    Inpatient Diabetes Program Recommendations Insulin - Basal: May need to increase Lantus back to 15 units daily. Correction (SSI): Reduce correction Novolog to moderate tid with meals  Insulin - Meal Coverage: .  Note:Will follow.

## 2011-11-16 LAB — GLUCOSE, CAPILLARY
Glucose-Capillary: 139 mg/dL — ABNORMAL HIGH (ref 70–99)
Glucose-Capillary: 152 mg/dL — ABNORMAL HIGH (ref 70–99)
Glucose-Capillary: 165 mg/dL — ABNORMAL HIGH (ref 70–99)
Glucose-Capillary: 205 mg/dL — ABNORMAL HIGH (ref 70–99)

## 2011-11-16 LAB — BASIC METABOLIC PANEL
CO2: 30 mEq/L (ref 19–32)
GFR calc non Af Amer: >90 mL/min (ref >90)
Glucose, Bld: 175 mg/dL — ABNORMAL HIGH (ref 70–99)
Potassium: 4.2 mEq/L (ref 3.5–5.1)
Sodium: 139 mEq/L (ref 135–145)

## 2011-11-16 NOTE — Progress Notes (Signed)
[name] CSN:[id number],MRN:[id number] is a 56 y.o. male,  Outpatient Primary MD for the patient is [name], MD  Chief Complaint  Patient presents with  . Fatigue        Subjective:   [name] today has, No headache, No chest pain, No abdominal pain - No Nausea, No new weakness tingling or numbness, No Cough - SOB.    Objective:   Filed Vitals:   11/15/11 0522 11/15/11 1445 11/15/11 2100 11/16/11 0609  BP: 151/58 138/78 133/67 154/67  Pulse: 71 76 69 63  Temp: 97.7 F (36.5 C) 98.7 F (37.1 C) 98.7 F (37.1 C) 97.4 F (36.3 C)  TempSrc: Oral Oral Oral Oral  Resp: 20 18 18 18   Height:      Weight:      SpO2: 100% 97% 99% 97%    Wt Readings from Last 3 Encounters:  11/12/11 81.647 kg (180 lb)  11/04/11 76.8 kg (169 lb 5 oz)  11/04/11 76.8 kg (169 lb 5 oz)     Intake/Output Summary (Last 24 hours) at 11/16/11 1103 Last data filed at 11/16/11 0500  Gross per 24 hour  Intake      0 ml  Output   1100 ml  Net  -1100 ml    Exam Awake Alert, Oriented *3, No new F.N deficits, Normal affect [place].AT,PERRAL Supple Neck,No JVD, No cervical lymphadenopathy appriciated.  Symmetrical Chest wall movement, Good air movement bilaterally, CTAB RRR,No Gallops,Rubs or new Murmurs, No Parasternal Heave +ve B.Sounds, Abd Soft, Non tender, No organomegaly appriciated, No rebound -guarding or rigidity. No Cyanosis, Clubbing or edema, No new Rash or bruise , R.BKA wound stable.  Data Review  CBC  Lab 11/14/11 0750 11/13/11 1037 11/12/11 1914  WBC 8.5 -- 7.3  HGB 11.7* -- 10.4*  HCT 36.7* -- 33.2*  PLT 248 -- 191  MCV 80.7 -- 81.8  MCH 25.7* -- 25.6*  MCHC 31.9 -- 31.3  RDW 16.7* -- 16.5*  LYMPHSABS -- 1.9 --  MONOABS -- 0.5 --  EOSABS -- 0.2 --  BASOSABS -- 0.0 --  BANDABS -- -- --    Chemistries   Lab 11/16/11 0625 11/14/11 0750 11/13/11 1037 11/12/11 1914  NA 139 139 138 138  K 4.2 4.0 4.0 4.1  CL 98 98 98 100  CO2 30 29 30 29   GLUCOSE 175* 252* 212*  273*  BUN 14 17 18 20   CREATININE 0.82 0.95 0.80 1.02  CALCIUM 9.4 9.5 9.3 9.6  MG -- -- 1.9 --  AST -- -- 20 --  ALT -- -- 18 --  ALKPHOS -- -- 70 --  BILITOT -- -- 0.3 --   ------------------------------------------------------------------------------------------------------------------ estimated creatinine clearance is 101.6 ml/min (by C-G formula based on Cr of 0.82). ------------------------------------------------------------------------------------------------------------------ No results found for this basename: HGBA1C:2 in the last 72 hours ------------------------------------------------------------------------------------------------------------------ No results found for this basename: CHOL:2,HDL:2,LDLCALC:2,TRIG:2,CHOLHDL:2,LDLDIRECT:2 in the last 72 hours ------------------------------------------------------------------------------------------------------------------ No results found for this basename: TSH,T4TOTAL,FREET3,T3FREE,THYROIDAB in the last 72 hours ------------------------------------------------------------------------------------------------------------------ No results found for this basename: VITAMINB12:2,FOLATE:2,FERRITIN:2,TIBC:2,IRON:2,RETICCTPCT:2 in the last 72 hours  Coagulation profile No results found for this basename: INR:5,PROTIME:5 in the last 168 hours  No results found for this basename: DDIMER:2 in the last 72 hours  Cardiac Enzymes No results found for this basename: CK:3,CKMB:3,TROPONINI:3,MYOGLOBIN:3 in the last 168 hours ------------------------------------------------------------------------------------------------------------------ No components found with this basename: POCBNP:3  Micro Results No results found for this or any previous visit (from the past 240 hour(s)).  Radiology Reports Mr Foot Right W Wo  Contrast  10/21/2011  *RADIOLOGY REPORT*  Clinical Data: Osteomyelitis.  MRI OF THE RIGHT FOREFOOT WITHOUT AND WITH CONTRAST   Technique:  Multiplanar, multisequence MR imaging was performed both before and after administration of intravenous contrast.  Contrast: 15mL MULTIHANCE GADOBENATE DIMEGLUMINE 529 MG/ML IV SOLN  Comparison: MRI 10/15/2011.  Findings: Since the prior examination, the patient has undergone amputation at the level of the proximal diaphysis of the first and second metatarsals.  Soft tissues demonstrate extensive edema and enhancement.  There appears be a soft tissue wound over the dorsum of the foot.  There is a rim enhancing fluid collection originating from the medial margin of the residual base of the first metatarsal extending distally to the end of the medial aspect of the foot.  The collection also extends over the dorsum of the foot at the level of the mid metatarsals to the level of the fourth metatarsal.  The collection cannot be discretely measured but is approximately 4.7 cm long by up to 2.8 cm cranial-caudal by 2.5 cm transverse.  IMPRESSION:  1.  Status post amputation at the level the bases of the first and second metatarsals.  No evidence of osteomyelitis is identified. 2.  Persistent marked cellulitis with a rim enhancing fluid collection compatible with abscess.  As noted above, the collection extends from approximately the medial aspect of the base of the first metatarsal distally to the end of the medial aspect of the foot.  At the level of the mid metatarsals, the collection extends over the dorsum of the foot laterally to the level of the fourth metatarsal.  Original Report Authenticated By: Bernadene Bell. Maricela Curet, M.D.   Dg Chest Port 1 View  11/15/2011  *RADIOLOGY REPORT*  Clinical Data: Cough.  Weakness.  PORTABLE CHEST - 1 VIEW  Comparison: 10/15/2011 CT chest.  Findings: Cardiomegaly.  Post CABG.  Central pulmonary vascular prominence. No infiltrate, congestive heart failure or pneumothorax.  Calcified aorta.  IMPRESSION: Post CABG.  Cardiomegaly with central pulmonary vascular prominence.   Original Report Authenticated By: Fuller Canada, M.D.   Dg Foot Complete Left  11/08/2011  *RADIOLOGY REPORT*  Clinical Data: Sore on the arch; diabetes  LEFT FOOT - COMPLETE 3+ VIEW  Comparison: October 14, 2011  Findings: There is no evidence of periosteal reaction or osseous erosion.  No fracture or dislocation.  The joint compartments are maintained.  No abnormal soft tissue gas is present.  Vascular calcifications are noted.  IMPRESSION: There is no evidence of acute abnormality.  Original Report Authenticated By: Brandon Melnick, M.D.    Scheduled Meds:    . aspirin EC  81 mg Oral Daily  . carvedilol  3.125 mg Oral BID WC  . docusate sodium  100 mg Oral BID  . heparin subcutaneous  5,000 Units Subcutaneous Q8H  . insulin aspart  0-24 Units Subcutaneous TID AC & HS  . insulin glargine  5 Units Subcutaneous Daily  . oxyCODONE  15 mg Oral Q12H  . polyethylene glycol  17 g Oral Daily  . rosuvastatin  20 mg Oral q1800   Continuous Infusions:    . sodium chloride 50 mL/hr at 11/15/11 1030   PRN Meds:.ALPRAZolam  Assessment & Plan   1. Weakness generalized - due to deconditioning - PT called, await placement.  2. Recent R.BKA- wound care, outpt follow with Dr Lajoyce Corners. Staples in place.  3. H/O CAD (coronary artery disease), autologous vein bypass graft - pain free, on ASA-statin add low dose Coreg.  4. HTN (hypertension) - coreg added, stable.  5. Diabetes mellitus 2 -  Low sugars last night, reduced lantus and ISS  CBG (last 3)   Basename 11/16/11 0724 11/16/11 0358 11/16/11 0009  GLUCAP 186* 165* 152*      6. H/O Atrial flutter with rapid ventricular response - Coreg, goal RC.Outpt Cards follow up.   DVT Prophylaxis  Heparin   See all Orders from today for further details     Leroy Sea M.D on 11/16/2011 at 11:03 AM  [facility] Office  [phone]

## 2011-11-16 NOTE — Progress Notes (Signed)
.  Clinical social worker initiated skilled nursing facility search, see placement note in patient shadow chart. Pt open to skilled nursing facility placement in [facility], and will need a letter of guarantee for medicaid application is still pending. .Clinical social worker continuing to follow pt to assist with pt dc plans and further csw needs.   Catha Gosselin, LCSWA  [phone] .11/16/2011 10:51am

## 2011-11-16 NOTE — Progress Notes (Signed)
Physical Therapy Treatment Patient Details Name: [name] MRN: [id number] DOB: 09/29/1956 Today's Date: 11/16/2011  PT Assessment/Plan  PT - Assessment/Plan Comments on Treatment Session: Pt made great progress today.   PT Plan: Discharge plan remains appropriate PT Frequency: Min 3X/week Follow Up Recommendations: Skilled nursing facility Equipment Recommended: Defer to next venue PT Goals  Acute Rehab PT Goals PT Goal: Supine/Side to Sit - Progress: Met PT Goal: Sit to Stand - Progress: Met PT Goal: Stand to Sit - Progress: Met PT Goal: Ambulate - Progress: Progressing toward goal PT Goal: Perform Home Exercise Program - Progress: Progressing toward goal  PT Treatment Precautions/Restrictions  Precautions Precautions: Fall Restrictions RLE Weight Bearing: Non weight bearing Mobility (including Balance) Bed Mobility Supine to Sit: 6: Modified independent (Device/Increase time);HOB flat Sitting - Scoot to Edge of Bed: 6: Modified independent (Device/Increase time);With rail Transfers Sit to Stand: Other (comment);With upper extremity assist;From bed;From chair/3-in-1;With armrests (Min Guard (A)) Sit to Stand Details (indicate cue type and reason): cues for hand placement Stand to Sit: Other (comment);With upper extremity assist;With armrests;To chair/3-in-1 Corporate treasurer (A)) Stand to Sit Details: Cues for hand placement & use of UE's to control descent Ambulation/Gait Ambulation/Gait Assistance: Other (comment) (Min Guard (A)) Ambulation/Gait Assistance Details (indicate cue type and reason): Guarding for safety due to decreased balance & cues to slow down & take his time.   Ambulation Distance (Feet): 15 Feet (2 trials.  ) Assistive device: Rolling walker Gait Pattern: Step-to pattern;Decreased step length - left Stairs: No Wheelchair Mobility Wheelchair Mobility: No    Exercise  General Exercises - Lower Extremity Long Arc Quad: Strengthening;Right;10 reps;Seated Hip  ABduction/ADduction: Strengthening;Right;10 reps (long sitting in recliner) Straight Leg Raises: Strengthening;10 reps;Right (Long sitting in recliner) End of Session PT - End of Session Equipment Utilized During Treatment: Gait belt Activity Tolerance: Patient tolerated treatment well Patient left: in chair;with call bell in reach Nurse Communication: Mobility status for transfers;Mobility status for ambulation General Behavior During Session: [facility] for tasks performed Cognition: [facility] for tasks performed  Lara Mulch 11/16/2011, 2:43 PM [phone]

## 2011-11-17 LAB — BASIC METABOLIC PANEL
CO2: 33 mEq/L — ABNORMAL HIGH (ref 19–32)
Calcium: 9.9 mg/dL (ref 8.4–10.5)
Creatinine, Ser: 1.04 mg/dL (ref 0.50–1.35)
GFR calc Af Amer: >90 mL/min (ref >90)
Sodium: 138 mEq/L (ref 135–145)

## 2011-11-17 LAB — GLUCOSE, CAPILLARY: Glucose-Capillary: 147 mg/dL — ABNORMAL HIGH (ref 70–99)

## 2011-11-17 MED ORDER — INSULIN ASPART 100 UNIT/ML ~~LOC~~ SOLN: 0.0000 [IU] | Freq: Three times a day (TID) | SUBCUTANEOUS | Status: DC

## 2011-11-17 MED ORDER — CARVEDILOL 3.125 MG PO TABS: 3.1250 mg | ORAL_TABLET | Freq: Two times a day (BID) | ORAL | Status: DC

## 2011-11-17 MED ORDER — INSULIN GLARGINE 100 UNIT/ML ~~LOC~~ SOLN: 7.0000 [IU] | Freq: Every day | SUBCUTANEOUS | Status: DC

## 2011-11-17 MED ORDER — SODIUM CHLORIDE 0.9 % IV SOLN
INTRAVENOUS | Status: AC
  Administered 2011-11-17: 12:00:00 via INTRAVENOUS

## 2011-11-17 MED ORDER — SODIUM POLYSTYRENE SULFONATE 15 GM/60ML PO SUSP
30.0000 g | Freq: Once | ORAL | Status: AC
  Administered 2011-11-17: 30 g via ORAL
  Filled 2011-11-17: qty 120

## 2011-11-17 NOTE — Discharge Summary (Signed)
[name], 56 y.o., DOB September 12, 1956, MRN [id number]. Admission date: 11/12/2011 Discharge Date 11/17/2011 Primary MD [name], MD Admitting Physician No admitting provider for patient encounter.  Admission Diagnosis  Physical deconditioning [728.2] Generalized weakness [780.79] Weakness  Discharge Diagnosis   Principal Problem:  *Weakness generalized Active Problems:  CAD (coronary artery disease), autologous vein bypass graft  HTN (hypertension)  Diabetes mellitus  Atrial flutter with rapid ventricular response  S/P BKA (below knee amputation) unilateral   Past Medical History  Diagnosis Date  . CAD (coronary artery disease), autologous vein bypass graft     s/p CABG in 80's or 90's, then cath x2, last in the 90's with DES and BMS placed but not sure where. Dr. Lupita Shutter at [facility]   . HTN (hypertension)   . 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 Anxiety   . Headache   . Atrial flutter with rapid ventricular response 10/15/2011  . NSTEMI (non-ST elevated myocardial infarction) 10/17/2011  . Diabetes mellitus     Past Surgical History  Procedure Date  . Coronary artery bypass graft   . No past surgeries   . Amputation 10/17/2011    Procedure: AMPUTATION RAY;  Surgeon: Cammy Copa;  Location: [facility] OR;  Service: Orthopedics;  Laterality: Right;  First and Second Ray Amputation  . Amputation 11/01/2011    Procedure: AMPUTATION BELOW KNEE;  Surgeon: Nadara Mustard, MD;  Location: [facility] OR;  Service: Orthopedics;  Laterality: Right;  Right Below Knee Amputation  . Rt bka      Hospital Course See H&P, Labs, Consult and Test reports for all details in brief, patient was admitted for    1. Weakness generalized - due to deconditioning - needs continue OT-PT at [facility].   2. Recent R.BKA- wound care, outpt follow with Dr Lajoyce Corners. Staples in place. Staples need to come out in 3-4 days check with Dr Lajoyce Corners before  removal.   3. H/O CAD (coronary artery disease), autologous vein bypass graft - pain free, on ASA-statin add low dose Coreg.    4. HTN (hypertension) - coreg added, stable.    5. Diabetes mellitus 2 - Low sugars 2 nights ago, reduced lantus and ISS - monitor glucose and adjust Insulin dose.   6. H/O Atrial flutter with rapid ventricular response - Coreg, goal RC.Outpt Cards follow up.   7.High K in am today - treated , repeat K 3.8 - please repeat K in am.     Consults  PT-OT  Significant Tests:  See full reports for all details    Dg Chest [facility] 1 View  11/15/2011  *RADIOLOGY REPORT*  Clinical Data: Cough.  Weakness.  PORTABLE CHEST - 1 VIEW  Comparison: 10/15/2011 CT chest.  Findings: Cardiomegaly.  Post CABG.  Central pulmonary vascular prominence. No infiltrate, congestive heart failure or pneumothorax.  Calcified aorta.  IMPRESSION: Post CABG.  Cardiomegaly with central pulmonary vascular prominence.  Original Report Authenticated By: Fuller Canada, M.D.     Today   Subjective:   Gurman Ashland today has no headache,no chest abdominal pain,no new weakness tingling or numbness, feels much better.  Objective:   Blood pressure 163/85, pulse 67, temperature 98.1 F (36.7 C), temperature source Oral, resp. rate 20, height 5\' 7"  (1.702 m), weight 81.647 kg (180 lb), SpO2 96.00%.  Intake/Output Summary (Last 24 hours) at 11/17/11 1419 Last data filed at 11/17/11 0547  Gross per 24 hour  Intake      0 ml  Output    475 ml  Net   -475 ml    Exam Awake Alert, Oriented *3, No new F.N deficits, Normal affect Stokes.AT,PERRAL Supple Neck,No JVD, No cervical lymphadenopathy appriciated.  Symmetrical Chest wall movement, Good air movement bilaterally, CTAB RRR,No Gallops,Rubs or new Murmurs, No Parasternal Heave +ve B.Sounds, Abd Soft, Non tender, No organomegaly appriciated, No rebound -guarding or rigidity. No Cyanosis, Clubbing or edema, No new Rash or bruise, Rt BKA stump  stable.  Data Review      CBC w Diff: Lab Results  Component Value Date   WBC 8.5 11/14/2011   HGB 11.7* 11/14/2011   HCT 36.7* 11/14/2011   PLT 248 11/14/2011   LYMPHOPCT 34 11/13/2011   MONOPCT 8 11/13/2011   EOSPCT 4 11/13/2011   BASOPCT 1 11/13/2011   CMP: Lab Results  Component Value Date   NA 138 11/17/2011   K 5.3* 11/17/2011   CL 97 11/17/2011   CO2 33* 11/17/2011   BUN 14 11/17/2011   CREATININE 1.04 11/17/2011   PROT 7.5 11/13/2011   ALBUMIN 2.8* 11/13/2011   BILITOT 0.3 11/13/2011   ALKPHOS 70 11/13/2011   AST 20 11/13/2011   ALT 18 11/13/2011  .   Discharge Instructions     Follow with Primary MD [name], MD in 3 days   Get CBC, CMP, checked 3 days by Primary MD and again as instructed by your Primary MD.   Get Medicines reviewed and adjusted.  Please request your Prim.MD to go over all Hospital Tests and Procedure/Radiological results at the follow up, please get all Hospital records sent to your Prim MD by signing hospital release before you go home.  Follow-up Information    Follow up with BLISS,KATHERINE. Make an appointment in 3 days.   Contact information:   [address] [place] [address] [phone]       Follow up with Nadara Mustard, MD. Make an appointment in 4 days.   Contact information:   [address] [place] [address] [phone]         Activity: Fall precautions use walker/cane & assistance as needed  Diet: Cardiac-Diabetic, Aspiration precautions.  For Heart failure patients - Check your Weight same time everyday, if you gain over 2 pounds, or you develop in leg swelling, experience more shortness of breath or chest pain, call your Primary MD immediately. Follow Cardiac Low Salt Diet and 1.8 lit/day fluid restriction.  Disposition Home  If you experience worsening of your admission symptoms, develop shortness of breath, life threatening emergency, suicidal or homicidal thoughts you  must seek medical attention immediately by calling 911 or calling your MD immediately  if symptoms less severe.  You Must read complete instructions/literature along with all the possible adverse reactions/side effects for all the Medicines you take and that have been prescribed to you. Take any new Medicines after you have completely understood and accpet all the possible adverse reactions/side effects.   Do not drive if your were admitted for syncope or siezures until you have seen by Primary MD or a Neurologist and advised to drive.  Do not drive when taking Pain medications.    Do not take more than prescribed Pain, Sleep and Anxiety Medications  Special Instructions: If you have smoked or chewed Tobacco  in the last 2 yrs please stop smoking, stop any regular Alcohol  and or any Recreational drug use.   Wear Seat  belts while driving.   Follow-up Information    Follow up with BLISS,KATHERINE. Make an appointment in 3 days.   Contact information:   [address] [place] [address] [phone]       Follow up with Nadara Mustard, MD. Make an appointment in 4 days.   Contact information:   [address] [place] [address] [phone]          Discharge Medications   Medication List  As of 11/17/2011  2:19 PM   START taking these medications         carvedilol 3.125 MG tablet   Commonly known as: COREG   Take 1 tablet (3.125 mg total) by mouth 2 (two) times daily with a meal.         CHANGE how you take these medications         insulin aspart 100 UNIT/ML injection   Commonly known as: novoLOG   Inject 0-24 Units into the skin 4 (four) times daily -  before meals and at bedtime. Before each meal 3 times a day, 140-199 - 2 units, 200-250 - 4 units, 250-299 - 6 units, 300-349 - 8 units,  350 or above 10 units.   What changed: - dose - how often to take the med - doctor's instructions         CONTINUE taking these  medications         ALPRAZolam 1 MG tablet   Commonly known as: XANAX      aspirin 81 MG EC tablet   Take 1 tablet (81 mg total) by mouth daily. Swallow whole.      DSS 100 MG Caps   Take 100 mg by mouth 2 (two) times daily.      insulin glargine 100 UNIT/ML injection   Commonly known as: LANTUS   Inject 7 Units into the skin daily.      oxyCODONE 15 MG Tb12   Commonly known as: OXYCONTIN   Take 1 tablet (15 mg total) by mouth every 12 (twelve) hours.      rosuvastatin 20 MG tablet   Commonly known as: CRESTOR   Take 1 tablet (20 mg total) by mouth daily at 6 PM.         ASK your doctor about these medications         polyethylene glycol packet   Commonly known as: MIRALAX / GLYCOLAX   Take 17 g by mouth daily.          Where to get your medications    These are the prescriptions that you need to pick up. We sent them to a specific pharmacy, so you will need to go there to get them.   [id number] - [place], [place] - [address]    [address] [place] [address]    Phone: [phone]        carvedilol 3.125 MG tablet         You may get these medications from any pharmacy.         insulin aspart 100 UNIT/ML injection             Total Time in preparing paper work, data evaluation and todays exam - 35 minutes  Leroy Sea M.D on 11/17/2011 at 2:19 PM  [facility] Office  [phone]

## 2011-11-17 NOTE — Progress Notes (Signed)
Patient received bed offers from CSW, and planned to dc to [facility]. CSW assisted with pt dc plans in providing a letter of guarantee. .Clinical social worker assisted with patient discharge to skilled nursing facility. .Patient transportation provided by [employer] [facility] with patient chart copy. .No further Clinical Social Work needs, signing off.   Catha Gosselin, Theresia Majors  [phone] .11/17/2011 16:08pm

## 2012-03-20 ENCOUNTER — Emergency Department (HOSPITAL_COMMUNITY): Payer: Self-pay

## 2012-03-20 ENCOUNTER — Emergency Department (HOSPITAL_COMMUNITY)
Admission: EM | Admit: 2012-03-20 | Discharge: 2012-03-21 | Disposition: A | Discharge status: Skilled Nursing Facility | Payer: Self-pay | Attending: Emergency Medicine | Admitting: Emergency Medicine

## 2012-03-20 ENCOUNTER — Encounter (HOSPITAL_COMMUNITY): Payer: Self-pay

## 2012-03-20 DIAGNOSIS — I2510 Atherosclerotic heart disease of native coronary artery without angina pectoris: Secondary | ICD-10-CM | POA: Insufficient documentation

## 2012-03-20 DIAGNOSIS — W050XXA Fall from non-moving wheelchair, initial encounter: Secondary | ICD-10-CM | POA: Insufficient documentation

## 2012-03-20 DIAGNOSIS — Z7982 Long term (current) use of aspirin: Secondary | ICD-10-CM | POA: Insufficient documentation

## 2012-03-20 DIAGNOSIS — I10 Essential (primary) hypertension: Secondary | ICD-10-CM | POA: Insufficient documentation

## 2012-03-20 DIAGNOSIS — S0083XA Contusion of other part of head, initial encounter: Secondary | ICD-10-CM

## 2012-03-20 DIAGNOSIS — R220 Localized swelling, mass and lump, head: Secondary | ICD-10-CM | POA: Insufficient documentation

## 2012-03-20 DIAGNOSIS — E119 Type 2 diabetes mellitus without complications: Secondary | ICD-10-CM | POA: Insufficient documentation

## 2012-03-20 DIAGNOSIS — R51 Headache: Secondary | ICD-10-CM | POA: Insufficient documentation

## 2012-03-20 DIAGNOSIS — W19XXXA Unspecified fall, initial encounter: Secondary | ICD-10-CM

## 2012-03-20 DIAGNOSIS — S0120XA Unspecified open wound of nose, initial encounter: Secondary | ICD-10-CM | POA: Insufficient documentation

## 2012-03-20 DIAGNOSIS — Z794 Long term (current) use of insulin: Secondary | ICD-10-CM | POA: Insufficient documentation

## 2012-03-20 DIAGNOSIS — R221 Localized swelling, mass and lump, neck: Secondary | ICD-10-CM | POA: Insufficient documentation

## 2012-03-20 DIAGNOSIS — S0003XA Contusion of scalp, initial encounter: Secondary | ICD-10-CM | POA: Insufficient documentation

## 2012-03-20 DIAGNOSIS — S0121XA Laceration without foreign body of nose, initial encounter: Secondary | ICD-10-CM

## 2012-03-20 DIAGNOSIS — S88119A Complete traumatic amputation at level between knee and ankle, unspecified lower leg, initial encounter: Secondary | ICD-10-CM | POA: Insufficient documentation

## 2012-03-20 DIAGNOSIS — Z79899 Other long term (current) drug therapy: Secondary | ICD-10-CM | POA: Insufficient documentation

## 2012-03-20 LAB — GLUCOSE, CAPILLARY: Glucose-Capillary: 178 mg/dL — ABNORMAL HIGH (ref 70–99)

## 2012-03-20 MED ORDER — TETANUS-DIPHTH-ACELL PERTUSSIS 5-2.5-18.5 LF-MCG/0.5 IM SUSP
0.5000 mL | Freq: Once | INTRAMUSCULAR | Status: AC
  Administered 2012-03-20: 0.5 mL via INTRAMUSCULAR
  Filled 2012-03-20: qty 0.5

## 2012-03-20 NOTE — Discharge Instructions (Signed)
Facial Laceration  A facial laceration is a cut on the face. Lacerations usually heal quickly, but they need special care to reduce scarring. It will take 1 to 2 years for the scar to lose its redness and to heal completely.  TREATMENT   Some facial lacerations may not require closure. Some lacerations may not be able to be closed due to an increased risk of infection. It is important to see your caregiver as soon as possible after an injury to minimize the risk of infection and to maximize the opportunity for successful closure.  If closure is appropriate, pain medicines may be given, if needed. The wound will be cleaned to help prevent infection. Your caregiver will use stitches (sutures), staples, wound glue (adhesive), or skin adhesive strips to repair the laceration. These tools bring the skin edges together to allow for faster healing and a better cosmetic outcome. However, all wounds will heal with a scar.   Once the wound has healed, scarring can be minimized by covering the wound with sunscreen during the day for 1 full year. Use a sunscreen with an SPF of at least 30. Sunscreen helps to reduce the pigment that will form in the scar. When applying sunscreen to a completely healed wound, massage the scar for a few minutes to help reduce the appearance of the scar. Use circular motions with your fingertips, on and around the scar. Do not massage a healing wound.  HOME CARE INSTRUCTIONS  For sutures:   Keep the wound clean and dry.   If you were given a bandage (dressing), you should change it at least once a day. Also change the dressing if it becomes wet or dirty, or as directed by your caregiver.   Wash the wound with soap and water 2 times a day. Rinse the wound off with water to remove all soap. Pat the wound dry with a clean towel.   After cleaning, apply a thin layer of the antibiotic ointment recommended by your caregiver. This will help prevent infection and keep the dressing from sticking.   You  may shower as usual after the first 24 hours. Do not soak the wound in water until the sutures are removed.   Only take over-the-counter or prescription medicines for pain, discomfort, or fever as directed by your caregiver.   Get your sutures removed as directed by your caregiver. With facial lacerations, sutures should usually be taken out after 4 to 5 days to avoid stitch marks.   Wait a few days after your sutures are removed before applying makeup.  For skin adhesive strips:   Keep the wound clean and dry.   Do not get the skin adhesive strips wet. You may bathe carefully, using caution to keep the wound dry.   If the wound gets wet, pat it dry with a clean towel.   Skin adhesive strips will fall off on their own. You may trim the strips as the wound heals. Do not remove skin adhesive strips that are still stuck to the wound. They will fall off in time.  For wound adhesive:   You may briefly wet your wound in the shower or bath. Do not soak or scrub the wound. Do not swim. Avoid periods of heavy perspiration until the skin adhesive has fallen off on its own. After showering or bathing, gently pat the wound dry with a clean towel.   Do not apply liquid medicine, cream medicine, ointment medicine, or makeup to your wound while the   skin adhesive is in place. This may loosen the film before your wound is healed.   If a dressing is placed over the wound, be careful not to apply tape directly over the skin adhesive. This may cause the adhesive to be pulled off before the wound is healed.   Avoid prolonged exposure to sunlight or tanning lamps while the skin adhesive is in place. Exposure to ultraviolet light in the first year will darken the scar.   The skin adhesive will usually remain in place for 5 to 10 days, then naturally fall off the skin. Do not pick at the adhesive film.  You may need a tetanus shot if:   You cannot remember when you had your last tetanus shot.   You have never had a tetanus  shot.  If you get a tetanus shot, your arm may swell, get red, and feel warm to the touch. This is common and not a problem. If you need a tetanus shot and you choose not to have one, there is a rare chance of getting tetanus. Sickness from tetanus can be serious.  SEEK IMMEDIATE MEDICAL CARE IF:   You develop redness, pain, or swelling around the wound.   There is yellowish-white fluid (pus) coming from the wound.   You develop chills or a fever.  MAKE SURE YOU:   Understand these instructions.   Will watch your condition.   Will get help right away if you are not doing well or get worse.  Document Released: 12/07/2004 Document Revised: 10/19/2011 Document Reviewed: 04/24/2011  ExitCare Patient Information 2012 [employer].

## 2012-03-20 NOTE — ED Notes (Signed)
Pt placed on oxygen due to some shortness of breath and oxygen saturation in the lower 90s

## 2012-03-20 NOTE — ED Notes (Signed)
[id number]<AV> Expected date:<BR> Expected time: 5:16 PM<BR> Means of arrival:<BR> Comments:<BR> M30 - 55yoM Fall fr wheelchair, lac to nose

## 2012-03-20 NOTE — ED Provider Notes (Signed)
History     CSN: [id number]  Arrival date & time 03/20/12  1721   First MD Initiated Contact with Patient 03/20/12 1923      Chief Complaint  Patient presents with  . Laceration  . Fall     The history is provided by the patient.   patient reports he fell from his wheelchair today and hit his face on a metal trash bin.  He had no loss consciousness.  He reports laceration to his right nose.  He denies trismus.  He reports his teeth to the left fill normal.  Denies neck pain.  He has no weakness of his upper lower extremities.  He denies chest pain or shortness breath.  He has no abdominal pain.  Past Medical History  Diagnosis Date  . CAD (coronary artery disease), autologous vein bypass graft     s/p CABG in 80's or 90's, then cath x2, last in the 90's with DES and BMS placed but not sure where. Dr. Lupita Shutter at [facility]   . HTN (hypertension)   . 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 Anxiety   . Headache   . Atrial flutter with rapid ventricular response 10/15/2011  . NSTEMI (non-ST elevated myocardial infarction) 10/17/2011  . Diabetes mellitus     Past Surgical History  Procedure Date  . Coronary artery bypass graft   . No past surgeries   . Amputation 10/17/2011    Procedure: AMPUTATION RAY;  Surgeon: Cammy Copa;  Location: [facility] OR;  Service: Orthopedics;  Laterality: Right;  First and Second Ray Amputation  . Amputation 11/01/2011    Procedure: AMPUTATION BELOW KNEE;  Surgeon: Nadara Mustard, MD;  Location: [facility] OR;  Service: Orthopedics;  Laterality: Right;  Right Below Knee Amputation  . Rt bka     Family History  Problem Relation Age of Onset  . Alzheimer's disease Father   . Melanoma Father   . Benign prostatic hyperplasia Father   . Coronary artery disease Father     s/p CABG  . Heart attack Brother   . Coronary artery disease Brother     s/p CABG  . Lymphoma Sister     non  Hodgkin's   . Aortic aneurysm Mother   . Anemia Mother     History  Substance Use Topics  . Smoking status: Former Smoker -- 0.5 packs/day for 35 years    Types: Cigarettes    Quit date: 07/02/2001  . Smokeless tobacco: Never Used  . Alcohol Use: No      Review of Systems  All other systems reviewed and are negative.    Allergies  Review of patient's allergies indicates no known allergies.  Home Medications   Current Outpatient Rx  Name Route Sig Dispense Refill  . ALPRAZOLAM 1 MG PO TABS Oral Take 1 mg by mouth at bedtime as needed. anxiety     . ASPIRIN 81 MG PO TBEC Oral Take 1 tablet (81 mg total) by mouth daily. Swallow whole. 30 tablet 12  . CARVEDILOL 3.125 MG PO TABS Oral Take 1 tablet (3.125 mg total) by mouth 2 (two) times daily with a meal. 30 tablet 0  . CITALOPRAM HYDROBROMIDE 20 MG PO TABS Oral Take 20 mg by mouth daily.    Marland Kitchen DOCUSATE SODIUM 100 MG PO CAPS Oral Take 100 mg by mouth 2 (two) times daily.    . FUROSEMIDE 20  MG PO TABS Oral Take 20 mg by mouth daily.    . INSULIN ASPART 100 UNIT/ML [place] SOLN Subcutaneous Inject 3-8 Units into the skin 3 (three) times daily before meals. 3u tid with an additional 5u if CBG is = or > 150    . INSULIN GLARGINE 100 UNIT/ML [place] SOLN Subcutaneous Inject 15 Units into the skin at bedtime.    Marland Kitchen LORATADINE 10 MG PO TABS Oral Take 10 mg by mouth daily.    . OXYCODONE HCL ER 15 MG PO TB12 Oral Take 1 tablet (15 mg total) by mouth every 12 (twelve) hours. 60 tablet 0  . POLYETHYLENE GLYCOL 3350 PO PACK Oral Take 17 g by mouth daily as needed. For constipation.    Scherrie November PO POWD Oral Take 1 scoop by mouth 2 (two) times daily as needed. To promote wound healing per resident request.    . ROSUVASTATIN CALCIUM 20 MG PO TABS Oral Take 1 tablet (20 mg total) by mouth daily at 6 PM. 30 tablet 0    BP 150/58  Pulse 72  Temp(Src) 99.3 F (37.4 C) (Oral)  Resp 16  SpO2 95%  Physical Exam  Nursing note and vitals  reviewed. Constitutional: He is oriented to person, place, and time. He appears well-developed and well-nourished.  HENT:  Head: Normocephalic and atraumatic.       Poor dentition.  No trismus.  Difficult to ascertain malocclusion given his absence of multiple teeth.  The patient has a laceration of his right nasal ala.  There is no active bleeding at this time.  There is no nasal septal hematoma.  He has swelling and tenderness of the maxilla  Eyes: EOM are normal.  Neck: Normal range of motion.       C-spine nontender  Cardiovascular: Normal rate, regular rhythm, normal heart sounds and intact distal pulses.   Pulmonary/Chest: Effort normal and breath sounds normal. No respiratory distress.  Abdominal: Soft. He exhibits no distension. There is no tenderness.  Musculoskeletal: Normal range of motion.       Right BKA healed  Neurological: He is alert and oriented to person, place, and time.  Skin: Skin is warm and dry.  Psychiatric: He has a normal mood and affect. Judgment normal.    ED Course  Procedures (including critical care time)  LACERATION REPAIR Performed by: Lyanne Co Consent: Verbal consent obtained. Risks and benefits: risks, benefits and alternatives were discussed Patient identity confirmed: provided demographic data Time out performed prior to procedure Prepped and Draped in normal sterile fashion Wound explored Laceration Location: right nasal ala Laceration Length: 1.5cm No Foreign Bodies seen or palpated Anesthesia: local infiltration Local anesthetic: none Anesthetic total: 0 Irrigation method: syringe Amount of cleaning: standard Skin closure: dermabond Number of sutures or staples: 0 Technique: dermabond Patient tolerance: Patient tolerated the procedure well with no immediate complications.   Labs Reviewed  GLUCOSE, CAPILLARY - Abnormal; Notable for the following:    Glucose-Capillary 178 (*)    All other components within normal limits   Ct  Maxillofacial Wo Cm  03/20/2012  *RADIOLOGY REPORT*  Clinical Data: Laceration post fall.  CT MAXILLOFACIAL WITHOUT CONTRAST  Technique:  Multidetector CT imaging of the maxillofacial structures was performed. Multiplanar CT image reconstructions were also generated.  Comparison: 05/26/2011  Findings: The paranasal sinuses are normally developed and well aerated. There is an old fracture deformity of the left lateral nasal bone.  Nasal septum is intact.  No acute fracture.  Orbits and  globes intact.  Mandible intact.  Multiple dental caries with a periapical abscess suspected at tooth number 26. Temporomandibular joints seated.  IMPRESSION:  1.  Negative for fracture or other acute bony abnormality. 2.  Extensive dental disease.  Original Report Authenticated By: Osa Craver, M.D.     1. Fall   2. Laceration of nose   3. Facial contusion       MDM  Patient with laceration of his nasal ala which was repaired with Dermabond.  CT maxillofacial without fracture.  C-spine cleared clinically.        Lyanne Co, MD 03/20/12 [phone]

## 2012-03-20 NOTE — ED Notes (Signed)
Per EMS pt in from [facility] for recent R BKA. Pt was transferring from bed to chair and wheelchair slipped away. Pt fell and hit nose on trash can. Denies complaints of neck and back pain. Pt has lac to nose. Bleeding controlled. Denies LOC.

## 2013-03-25 IMAGING — CR DG FOOT 2V*L*
1 series · 1 of 1 positions shown · non-contrast
Comparison: None.

CLINICAL DATA: Penetrating wounds to the ball of the left foot,
with worsening erythema and blistering.

LEFT FOOT - 2 VIEW

[x foot lat left]
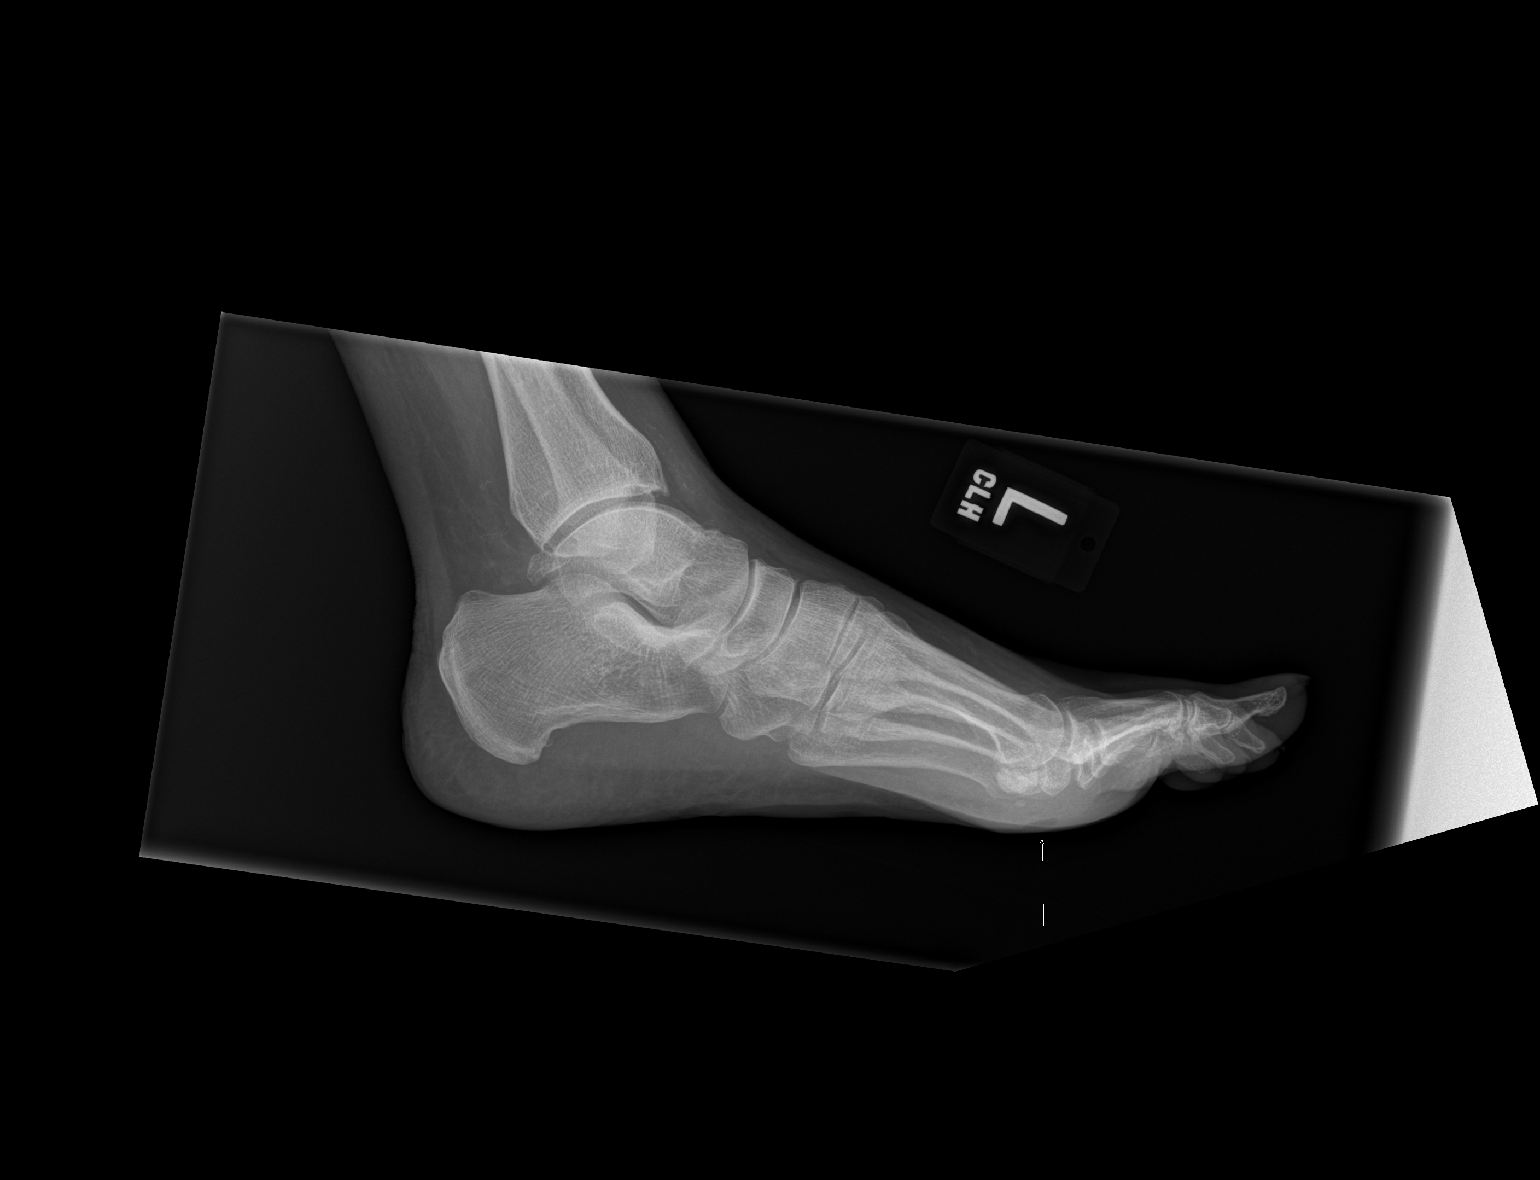

[1 of 1 positions shown; findings below may reference images not displayed]

FINDINGS: There is no evidence of fracture or dislocation.  No
osseous erosions are seen to suggest osteomyelitis.  The joint
spaces are preserved.  There is no evidence of talar subluxation;
the subtalar joint is unremarkable in appearance.

Soft tissue swelling and defect are noted along the plantar aspect
of the forefoot.
IMPRESSION: 1.  No evidence of fracture or dislocation.  No definite evidence
for osteomyelitis, though if there is significant clinical concern
for osteomyelitis, MRI could be considered for further evaluation.
2.  Soft tissue defect and swelling along the plantar aspect of the
forefoot.

## 2013-04-21 ENCOUNTER — Emergency Department: Payer: Self-pay | Admitting: Emergency Medicine

## 2013-04-21 DIAGNOSIS — E119 Type 2 diabetes mellitus without complications: Secondary | ICD-10-CM | POA: Insufficient documentation

## 2013-04-21 DIAGNOSIS — I2510 Atherosclerotic heart disease of native coronary artery without angina pectoris: Secondary | ICD-10-CM | POA: Insufficient documentation

## 2013-04-21 LAB — CBC WITH DIFFERENTIAL/PLATELET
Basophil #: 0.1 10*3/uL (ref 0.0–0.1)
Basophil %: 0.9 %
Eosinophil #: 0.3 10*3/uL (ref 0.0–0.7)
HGB: 14.1 g/dL (ref 13.0–18.0)
Lymphocyte #: 1.4 10*3/uL (ref 1.0–3.6)
Lymphocyte %: 12.7 %
MCH: 28.4 pg (ref 26.0–34.0)
MCHC: 34 g/dL (ref 32.0–36.0)
Neutrophil #: 8.9 10*3/uL — ABNORMAL HIGH (ref 1.4–6.5)
RBC: 4.95 10*6/uL (ref 4.40–5.90)
RDW: 14.4 % (ref 11.5–14.5)
WBC: 11.3 10*3/uL — ABNORMAL HIGH (ref 3.8–10.6)

## 2013-04-21 LAB — COMPREHENSIVE METABOLIC PANEL
Albumin: 3.3 g/dL — ABNORMAL LOW (ref 3.4–5.0)
Alkaline Phosphatase: 85 U/L (ref 50–136)
Bilirubin,Total: 0.2 mg/dL (ref 0.2–1.0)
Chloride: 107 mmol/L (ref 98–107)
Co2: 27 mmol/L (ref 21–32)
Creatinine: 1.08 mg/dL (ref 0.60–1.30)
EGFR (African American): >60
EGFR (Non-African Amer.): >60
Osmolality: 296 (ref 275–301)
Potassium: 4.1 mmol/L (ref 3.5–5.1)
SGOT(AST): 39 U/L — ABNORMAL HIGH (ref 15–37)
Sodium: 141 mmol/L (ref 136–145)
Total Protein: 7.3 g/dL (ref 6.4–8.2)

## 2013-04-21 LAB — URINALYSIS, COMPLETE
Bacteria: NONE SEEN
Bilirubin,UR: NEGATIVE
Glucose,UR: >=500 mg/dL (ref 0–75)
Ketone: NEGATIVE
Nitrite: NEGATIVE
Ph: 5 (ref 4.5–8.0)
Protein: NEGATIVE
RBC,UR: 55 /HPF (ref 0–5)
Squamous Epithelial: 1
WBC UR: 23 /HPF (ref 0–5)

## 2013-04-21 LAB — PROTIME-INR: INR: 1

## 2013-04-21 LAB — CK TOTAL AND CKMB (NOT AT ARMC)
CK, Total: 131 U/L (ref 35–232)
CK-MB: 7.1 ng/mL — ABNORMAL HIGH (ref 0.5–3.6)

## 2013-04-21 LAB — TROPONIN I: Troponin-I: 0.25 ng/mL — ABNORMAL HIGH

## 2013-05-12 ENCOUNTER — Ambulatory Visit: Payer: Self-pay | Admitting: Cardiovascular Disease

## 2013-05-13 ENCOUNTER — Encounter: Payer: Self-pay | Admitting: *Deleted

## 2013-05-15 ENCOUNTER — Ambulatory Visit (INDEPENDENT_AMBULATORY_CARE_PROVIDER_SITE_OTHER): Payer: BC Managed Care – PPO | Admitting: Cardiovascular Disease

## 2013-05-15 ENCOUNTER — Encounter: Payer: Self-pay | Admitting: Cardiovascular Disease

## 2013-05-15 VITALS — BP 130/76 | HR 94 | Ht 67.0 in | Wt 210.0 lb

## 2013-05-15 DIAGNOSIS — I25810 Atherosclerosis of coronary artery bypass graft(s) without angina pectoris: Secondary | ICD-10-CM

## 2013-05-15 DIAGNOSIS — I4892 Unspecified atrial flutter: Secondary | ICD-10-CM

## 2013-05-15 DIAGNOSIS — I10 Essential (primary) hypertension: Secondary | ICD-10-CM

## 2013-05-15 MED ORDER — METOPROLOL TARTRATE 25 MG PO TABS: 25.0000 mg | ORAL_TABLET | Freq: Two times a day (BID) | ORAL | Status: DC

## 2013-05-15 NOTE — Patient Instructions (Addendum)
Start Metoprolol 25 mg twice daily.  Continue other medications.  Follow up in 3 months.

## 2013-05-20 ENCOUNTER — Encounter: Payer: Self-pay | Admitting: Cardiovascular Disease

## 2013-05-20 DIAGNOSIS — I4892 Unspecified atrial flutter: Secondary | ICD-10-CM | POA: Insufficient documentation

## 2013-05-20 NOTE — Assessment & Plan Note (Signed)
His blood pressure is reasonably controlled on current medications. 

## 2013-05-20 NOTE — Assessment & Plan Note (Addendum)
Is doing reasonably well after recent non-ST elevation myocardial infarction. Unfortunately, they were not able to deliver a stent to the left circumflex which is a large dominant vessel. The patient currently is not having recurrent angina and also I recommend continuing medical therapy. However, I will have a low threshold for proceeding with repeat cardiac catheterization if he develops symptoms suggestive of angina. He is to continue on life long dual antiplatelet therapy if tolerated.  He will require a transthoracic echocardiogram to evaluate LV systolic function in the near future.

## 2013-05-20 NOTE — Assessment & Plan Note (Signed)
He is currently in atrial flutter. Ventricular rate is somewhat on the high side. This is not a new diagnosis. I will start treatment with metoprolol 25 mg twice daily. Ultimately, he will need anticoagulation. However, he is not on dual antiplatelet therapy due to her recent drug-eluting stent placement to the left main coronary artery.

## 2013-05-20 NOTE — Progress Notes (Signed)
HPI  This is a 57 year old man who is here today to establish cardiovascular care. He has extensive cardiovascular history. I spent more than 60 minutes reviewing his medical records from [place] [facility] [place] [facility]. He has remote history of CABG with multiple subsequent cardiac catheterizations and PCI. He presented in June of this year with non-ST elevation myocardial infarction and was transferred to [facility]. He underwent cardiac catheterization by Dr. Fransico Kendre which showed significant left main and three-vessel coronary artery disease (nondominant RCA) with patent LIMA to LAD and occluded SVG to OM (new) and SVG to PDA(old?). Attempted PCI was done on the left main and left circumflex. The procedure was very difficult due to heavy calcifications. It was unsuccessful due to inability to deliver a stent to the left circumflex. A small 2.5 mm drug-eluting stent was placed in the distal left main extending into the ostial left circumflex. The procedure was very prolonged and there was concern about possible radiation injury. However, the patient did not develop any symptoms.  He has known history of diabetes and peripheral vascular disease with below the knee amputation. He also has history of paroxysmal atrial flutter documented during his previous admissions at [facility]. He has been taking his medications regularly. Overall, he feels better and denies any recurrent chest pain.  No Known Allergies   Current Outpatient Prescriptions on File Prior to Visit  Medication Sig Dispense Refill  . ALPRAZolam (XANAX) 1 MG tablet Take 1 mg by mouth at bedtime as needed. anxiety       . insulin glargine (LANTUS) 100 UNIT/ML injection Inject 16 Units into the skin at bedtime.       Marland Kitchen loratadine (CLARITIN) 10 MG tablet Take 10 mg by mouth daily.      . insulin aspart (NOVOLOG) 100 UNIT/ML injection Inject 3-8 Units into the skin 3 (three) times daily before meals. 3u tid with an additional 5u if CBG is = or >  150       No current facility-administered medications on file prior to visit.     Past Medical History  Diagnosis Date  . HTN (hypertension)   . 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 Anxiety   . Headache(784.0)   . NSTEMI (non-ST elevated myocardial infarction) 10/17/2011  . Diabetes mellitus   . Pulmonary emphysema   . Obesity   . Paroxysmal atrial flutter 10/15/2011  . CAD (coronary artery disease), autologous vein bypass graft     s/p CABG in 80's or 90's, multiple caths/PCI. Presented in June of 2014 with non-ST elevation myocardial infarction. Cardiac catheterization done by Dr. Fransico Mars at [facility] showed severe three-vessel coronary artery disease (nondominant RCA )with patent LIMA to LAD, occluded SVG to OM1(new) and occluded SVG to PDA. There was significant ostial and distal left main stenosis. Attempted left main/LCX PCI     Past Surgical History  Procedure Laterality Date  . Coronary artery bypass graft    . No past surgeries    . Amputation  10/17/2011    Procedure: AMPUTATION RAY;  Surgeon: Cammy Copa;  Location: [facility] OR;  Service: Orthopedics;  Laterality: Right;  First and Second Ray Amputation  . Amputation  11/01/2011    Procedure: AMPUTATION BELOW KNEE;  Surgeon: Nadara Mustard, MD;  Location: [facility] OR;  Service: Orthopedics;  Laterality: Right;  Right Below Knee Amputation  . Rt bka    .  Cardiac catheterization  09/2006    [facility];EF 60%     Family History  Problem Relation Age of Onset  . Alzheimer's disease Father   . Melanoma Father   . Benign prostatic hyperplasia Father   . Coronary artery disease Father     s/p CABG  . Heart attack Brother   . Coronary artery disease Brother     s/p CABG  . Lymphoma Sister     non Hodgkin's   . Aortic aneurysm Mother   . Anemia Mother      History   Social History  . Marital Status: Single    Spouse Name: N/A    Number of  Children: N/A  . Years of Education: N/A   Occupational History  . Not on file.   Social History Main Topics  . Smoking status: Former Smoker -- 0.50 packs/day for 35 years    Types: Cigarettes    Quit date: 07/02/2001  . Smokeless tobacco: Never Used  . Alcohol Use: No  . Drug Use: No  . Sexually Active: Not Currently   Other Topics Concern  . Not on file   Social History Narrative   Difficult home situation because he cares for his dad with Alzheimer's, younger brother lives at home as well but doesn't get along with the dad at all and patient is in between the two, creating significant stress. Pt is concerned about who will care for the dad while he is in the hospital, is requesting help. He is single and has no children and only source of help is his niece.      ROS Constitutional: Negative for fever, chills, diaphoresis, activity change, appetite change and fatigue.  HENT: Negative for hearing loss, nosebleeds, congestion, sore throat, facial swelling, drooling, trouble swallowing, neck pain, voice change, sinus pressure and tinnitus.  Eyes: Negative for photophobia, pain, discharge and visual disturbance.  Respiratory: Negative for apnea, cough, chest tightness, shortness of breath and wheezing.  Cardiovascular: Negative for chest pain, palpitations and leg swelling.  Gastrointestinal: Negative for nausea, vomiting, abdominal pain, diarrhea, constipation, blood in stool and abdominal distention.  Genitourinary: Negative for dysuria, urgency, frequency, hematuria and decreased urine volume.  Musculoskeletal: Negative for myalgias, back pain, joint swelling, arthralgias and gait problem.  Skin: Negative for color change, pallor, rash and wound.  Neurological: Negative for dizziness, tremors, seizures, syncope, speech difficulty, weakness, light-headedness, numbness and headaches.  Psychiatric/Behavioral: Negative for suicidal ideas, hallucinations, behavioral problems and  agitation. The patient is not nervous/anxious.     PHYSICAL EXAM   BP 130/76  Pulse 94  Ht 5\' 7"  (1.702 m)  Wt 210 lb (95.255 kg)  BMI 32.88 kg/m2 Constitutional: He is oriented to person, place, and time. He appears well-developed and well-nourished. No distress.  HENT: No nasal discharge.  Head: Normocephalic and atraumatic.  Eyes: Pupils are equal and round. Right eye exhibits no discharge. Left eye exhibits no discharge.  Neck: Normal range of motion. Neck supple. No JVD present. No thyromegaly present.  Cardiovascular: Normal rate, regular rhythm, normal heart sounds and. Exam reveals no gallop and no friction rub. No murmur heard.  Pulmonary/Chest: Effort normal and breath sounds normal. No stridor. No respiratory distress. He has no wheezes. He has no rales. He exhibits no tenderness.  Abdominal: Soft. Bowel sounds are normal. He exhibits no distension. There is no tenderness. There is no rebound and no guarding.  Musculoskeletal: Normal range of motion. He exhibits no edema and no tenderness.  Neurological: He  is alert and oriented to person, place, and time. Coordination normal.  Skin: Skin is warm and dry. No rash noted. He is not diaphoretic. No erythema. No pallor.  Psychiatric: He has a normal mood and affect. His behavior is normal. Judgment and thought content normal.       ZOX:[id number]  Rhythm  P:QRS - 1:1, Abnormal P axis, H Rate 94 -Nonspecific QRS widening.   -Inferior infarct -age undetermined  -Prominent R(V1) -true posterior extension of inferior MI.   -Nonspecific ST depression   +   T-abnormality  -Nondiagnostic  -Possible  Inferior and lateral  ischemia.    ASSESSMENT AND PLAN

## 2013-05-29 ENCOUNTER — Encounter: Payer: Self-pay | Admitting: *Deleted

## 2013-05-29 ENCOUNTER — Inpatient Hospital Stay: Payer: Self-pay | Admitting: Internal Medicine

## 2013-05-29 DIAGNOSIS — I214 Non-ST elevation (NSTEMI) myocardial infarction: Secondary | ICD-10-CM

## 2013-05-29 LAB — BASIC METABOLIC PANEL
BUN: 17 mg/dL (ref 7–18)
Calcium, Total: 8.5 mg/dL (ref 8.5–10.1)
Chloride: 107 mmol/L (ref 98–107)
Co2: 21 mmol/L (ref 21–32)
Creatinine: 1.2 mg/dL (ref 0.60–1.30)
EGFR (African American): >60
EGFR (Non-African Amer.): >60
Glucose: 389 mg/dL — ABNORMAL HIGH (ref 65–99)
Osmolality: 293 (ref 275–301)
Potassium: 4 mmol/L (ref 3.5–5.1)
Sodium: 138 mmol/L (ref 136–145)

## 2013-05-29 LAB — CK TOTAL AND CKMB (NOT AT ARMC)
CK, Total: 123 U/L (ref 35–232)
CK, Total: 243 U/L — ABNORMAL HIGH (ref 35–232)
CK, Total: 172 U/L (ref 35–232)
CK-MB: 15.8 ng/mL — ABNORMAL HIGH (ref 0.5–3.6)

## 2013-05-29 LAB — CBC
HCT: 36.3 % — ABNORMAL LOW (ref 40.0–52.0)
MCH: 27.2 pg (ref 26.0–34.0)
MCV: 83 fL (ref 80–100)
Platelet: 109 10*3/uL — ABNORMAL LOW (ref 150–440)
RBC: 4.35 10*6/uL — ABNORMAL LOW (ref 4.40–5.90)
WBC: 10.2 10*3/uL (ref 3.8–10.6)

## 2013-05-29 LAB — LIPID PANEL
Ldl Cholesterol, Calc: 58 mg/dL (ref 0–100)
Triglycerides: 127 mg/dL (ref 0–200)
VLDL Cholesterol, Calc: 25 mg/dL (ref 5–40)

## 2013-05-29 LAB — APTT: Activated PTT: 88.3 secs — ABNORMAL HIGH (ref 23.6–35.9)

## 2013-05-29 LAB — PROTIME-INR
INR: 1
Prothrombin Time: 13.5 secs (ref 11.5–14.7)

## 2013-05-29 LAB — TROPONIN I
Troponin-I: 0.16 ng/mL — ABNORMAL HIGH
Troponin-I: 6.5 ng/mL — ABNORMAL HIGH

## 2013-05-30 DIAGNOSIS — I2510 Atherosclerotic heart disease of native coronary artery without angina pectoris: Secondary | ICD-10-CM

## 2013-05-30 DIAGNOSIS — I4891 Unspecified atrial fibrillation: Secondary | ICD-10-CM

## 2013-05-30 DIAGNOSIS — R079 Chest pain, unspecified: Secondary | ICD-10-CM

## 2013-05-30 HISTORY — PX: CARDIAC CATHETERIZATION: SHX172

## 2013-05-30 LAB — CBC WITH DIFFERENTIAL/PLATELET
Basophil #: 0.1 10*3/uL (ref 0.0–0.1)
Basophil %: 0.8 %
Eosinophil #: 0.4 10*3/uL (ref 0.0–0.7)
Eosinophil %: 4.2 %
HCT: 33.5 % — ABNORMAL LOW (ref 40.0–52.0)
HGB: 11.5 g/dL — ABNORMAL LOW (ref 13.0–18.0)
MCHC: 34.3 g/dL (ref 32.0–36.0)
MCV: 83 fL (ref 80–100)
Monocyte #: 0.5 x10 3/mm (ref 0.2–1.0)
Monocyte %: 6.4 %
Neutrophil #: 5.7 10*3/uL (ref 1.4–6.5)
WBC: 8.6 10*3/uL (ref 3.8–10.6)

## 2013-05-30 LAB — LIPID PANEL
Cholesterol: 112 mg/dL (ref 0–200)
HDL Cholesterol: 30 mg/dL — ABNORMAL LOW (ref 40–60)
Ldl Cholesterol, Calc: 56 mg/dL (ref 0–100)

## 2013-05-30 LAB — BASIC METABOLIC PANEL
Anion Gap: 9 (ref 7–16)
Chloride: 107 mmol/L (ref 98–107)
Co2: 23 mmol/L (ref 21–32)
EGFR (Non-African Amer.): >60
Potassium: 4.4 mmol/L (ref 3.5–5.1)
Sodium: 139 mmol/L (ref 136–145)

## 2013-05-30 LAB — APTT: Activated PTT: 118.4 secs — ABNORMAL HIGH (ref 23.6–35.9)

## 2013-05-31 LAB — BASIC METABOLIC PANEL
BUN: 18 mg/dL (ref 7–18)
Calcium, Total: 8.4 mg/dL — ABNORMAL LOW (ref 8.5–10.1)
Chloride: 106 mmol/L (ref 98–107)
Co2: 28 mmol/L (ref 21–32)
Creatinine: 1.2 mg/dL (ref 0.60–1.30)
EGFR (African American): >60
EGFR (Non-African Amer.): >60
Glucose: 227 mg/dL — ABNORMAL HIGH (ref 65–99)
Osmolality: 287 (ref 275–301)
Sodium: 139 mmol/L (ref 136–145)

## 2013-06-05 ENCOUNTER — Encounter: Payer: Self-pay | Admitting: Cardiovascular Disease

## 2013-06-19 ENCOUNTER — Ambulatory Visit (INDEPENDENT_AMBULATORY_CARE_PROVIDER_SITE_OTHER): Payer: BC Managed Care – PPO | Admitting: Cardiovascular Disease

## 2013-06-19 ENCOUNTER — Encounter: Payer: Self-pay | Admitting: Cardiovascular Disease

## 2013-06-19 VITALS — BP 164/90 | HR 63 | Ht 68.0 in | Wt 238.0 lb

## 2013-06-19 DIAGNOSIS — I5022 Chronic systolic (congestive) heart failure: Secondary | ICD-10-CM | POA: Insufficient documentation

## 2013-06-19 DIAGNOSIS — I25810 Atherosclerosis of coronary artery bypass graft(s) without angina pectoris: Secondary | ICD-10-CM

## 2013-06-19 DIAGNOSIS — I4892 Unspecified atrial flutter: Secondary | ICD-10-CM

## 2013-06-19 MED ORDER — CARVEDILOL 3.125 MG PO TABS: 3.1250 mg | ORAL_TABLET | Freq: Two times a day (BID) | ORAL | Status: DC

## 2013-06-19 MED ORDER — LOSARTAN POTASSIUM 50 MG PO TABS: 50.0000 mg | ORAL_TABLET | Freq: Every day | ORAL | Status: DC

## 2013-06-19 NOTE — Assessment & Plan Note (Signed)
No recent episodes of this. This happened in the setting of acute illness. If he develops recurrent episodes, I will likely treat him with long-term anticoagulation. However, he is currently on dual antiplatelet therapy which will be continued.

## 2013-06-19 NOTE — Progress Notes (Signed)
HPI  This is a 57 year old man who is here today for a followup visit regarding coronary artery disease and chronic systolic heart failure due to ischemic cardiomyopathy. He has remote history of CABG with multiple subsequent cardiac catheterizations and PCI. He presented in June of this year with non-ST elevation myocardial infarction and was transferred to [facility]. He underwent cardiac catheterization by Dr. Fransico Pritesh which showed significant left main and three-vessel coronary artery disease (nondominant RCA) with patent LIMA to LAD and occluded SVG to OM (new) and SVG to PDA(old). Attempted PCI was done on the left main and left circumflex. The procedure was very difficult due to heavy calcifications. It was unsuccessful due to inability to deliver a stent to the left circumflex. A small 2.5 mm drug-eluting stent was placed in the distal left main extending into the ostial left circumflex.  He has known history of diabetes and peripheral vascular disease with right below the knee amputation. He also has history of paroxysmal atrial flutter documented during his previous admissions at [facility]. He has been taking his medications regularly.  He presented recently to [facility] with a small non-ST elevation myocardial infarction. I repeated cardiac catheterization which showed subtotally occluded in the left circumflex at the site of recent PCI at [facility]. Ejection fraction was 35%. LIMA was patent and provides reasonable collaterals to the RCA. I discussed the case today during cath Conference. Medical therapy is recommended. He is overall feeling better. He denies chest pain or dyspnea.   He complains of frequent episodes of dry cough.  No Known Allergies   Current Outpatient Prescriptions on File Prior to Visit  Medication Sig Dispense Refill  . ALPRAZolam (XANAX) 1 MG tablet Take 1 mg by mouth at bedtime as needed. anxiety       . aspirin 325 MG tablet Take 325 mg by mouth daily.      . clopidogrel  (PLAVIX) 75 MG tablet Take 75 mg by mouth daily.      Marland Kitchen glimepiride (AMARYL) 2 MG tablet Take 2 mg by mouth daily before breakfast.      . insulin aspart (NOVOLOG) 100 UNIT/ML injection Inject 3-8 Units into the skin 3 (three) times daily before meals. 3u tid with an additional 5u if CBG is = or > 150      . insulin glargine (LANTUS) 100 UNIT/ML injection Inject 16 Units into the skin at bedtime.       Marland Kitchen lisinopril (PRINIVIL,ZESTRIL) 10 MG tablet Take 10 mg by mouth daily.      Marland Kitchen loratadine (CLARITIN) 10 MG tablet Take 10 mg by mouth daily.      . simvastatin (ZOCOR) 10 MG tablet Take 10 mg by mouth at bedtime.       No current facility-administered medications on file prior to visit.     Past Medical History  Diagnosis Date  . HTN (hypertension)   . 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 Anxiety   . Headache(784.0)   . NSTEMI (non-ST elevated myocardial infarction) 10/17/2011  . Diabetes mellitus   . Pulmonary emphysema   . Obesity   . Paroxysmal atrial flutter 10/15/2011  . CAD (coronary artery disease), autologous vein bypass graft     s/p CABG in 80's or 90's, multiple caths/PCI. Presented in June of 2014 with non-ST elevation myocardial infarction. Cardiac catheterization done by Dr. Fransico Levorn at [facility] showed severe three-vessel coronary artery  disease (nondominant RCA )with patent LIMA to LAD, occluded SVG to OM1(new) and occluded SVG to PDA. There was significant ostial and distal left main stenosis. Attempted left main/LCX PCI  . MI (myocardial infarction)      Past Surgical History  Procedure Laterality Date  . Coronary artery bypass graft    . No past surgeries    . Amputation  10/17/2011    Procedure: AMPUTATION RAY;  Surgeon: Cammy Copa;  Location: [facility] OR;  Service: Orthopedics;  Laterality: Right;  First and Second Ray Amputation  . Amputation  11/01/2011    Procedure: AMPUTATION BELOW  KNEE;  Surgeon: Nadara Mustard, MD;  Location: [facility] OR;  Service: Orthopedics;  Laterality: Right;  Right Below Knee Amputation  . Rt bka    . Cardiac catheterization  09/2006    [facility];EF 60%  . Cardiac catheterization  05/30/13    [facility]     Family History  Problem Relation Age of Onset  . Alzheimer's disease Father   . Melanoma Father   . Benign prostatic hyperplasia Father   . Coronary artery disease Father     s/p CABG  . Heart attack Brother   . Coronary artery disease Brother     s/p CABG  . Lymphoma Sister     non Hodgkin's   . Aortic aneurysm Mother   . Anemia Mother      History   Social History  . Marital Status: Single    Spouse Name: N/A    Number of Children: N/A  . Years of Education: N/A   Occupational History  . Not on file.   Social History Main Topics  . Smoking status: Former Smoker -- 0.50 packs/day for 35 years    Types: Cigarettes    Quit date: 07/02/2001  . Smokeless tobacco: Never Used  . Alcohol Use: No  . Drug Use: No  . Sexually Active: Not Currently   Other Topics Concern  . Not on file   Social History Narrative   Difficult home situation because he cares for his dad with Alzheimer's, younger brother lives at home as well but doesn't get along with the dad at all and patient is in between the two, creating significant stress. Pt is concerned about who will care for the dad while he is in the hospital, is requesting help. He is single and has no children and only source of help is his niece.       PHYSICAL EXAM   BP 164/90  Pulse 63  Ht 5\' 8"  (1.727 m)  Wt 238 lb (107.956 kg)  BMI 36.2 kg/m2 Constitutional: He is oriented to person, place, and time. He appears well-developed and well-nourished. No distress.  HENT: No nasal discharge.  Head: Normocephalic and atraumatic.  Eyes: Pupils are equal and round. Right eye exhibits no discharge. Left eye exhibits no discharge.  Neck: Normal range of motion. Neck supple. No JVD present. No  thyromegaly present.  Cardiovascular: Normal rate, regular rhythm, normal heart sounds and. Exam reveals no gallop and no friction rub. No murmur heard.  Pulmonary/Chest: Effort normal and breath sounds normal. No stridor. No respiratory distress. He has no wheezes. He has no rales. He exhibits no tenderness.  Abdominal: Soft. Bowel sounds are normal. He exhibits no distension. There is no tenderness. There is no rebound and no guarding.  Musculoskeletal: Normal range of motion. He exhibits no edema and no tenderness.  Neurological: He is alert and oriented to person, place, and time.  Coordination normal.  Skin: Skin is warm and dry. No rash noted. He is not diaphoretic. No erythema. No pallor.  Psychiatric: He has a normal mood and affect. His behavior is normal. Judgment and thought content normal.       EKG: Sinus  Rhythm  - occasional PAC    # PACs = 1. -Nonspecific QRS widening.   -ST depression   +   Negative T-waves  -Nondiagnostic  -Possible  Lateral  ischemia.   ABNORMAL    ASSESSMENT AND PLAN

## 2013-06-19 NOTE — Patient Instructions (Addendum)
Stop taking Lisinopril.  Start Losartan 50 mg once daily.   Continue other medications.   Follow up in 3 months.

## 2013-06-19 NOTE — Assessment & Plan Note (Signed)
He appears to be euvolemic. Recent ejection fraction was 35%. He reports dry cough which is likely due to lisinopril. Thus, I will stop this and add losartan 50 mg once daily. I would consider adding aldosterone antagonist in the near future. Repeat evaluation of LV systolic function in 3 months to determine if an ICD is indicated.

## 2013-06-19 NOTE — Assessment & Plan Note (Signed)
Based on his recent cardiac catheterization, I recommend continuing medical therapy. No options for PCI and no good targets for redo CABG. I do think there is much benefit in that anyway given that his LIMA is patent and provides collaterals. Continue dual antiplatelet therapy. I will consider adding long-acting nitroglycerin, amlodipine or Ranexa if he has angina.

## 2013-07-31 ENCOUNTER — Telehealth: Payer: Self-pay

## 2013-07-31 NOTE — Telephone Encounter (Signed)
Received fax from [facility] that pt was No-Show. "no calls accepted per voicemail"

## 2013-09-19 ENCOUNTER — Ambulatory Visit (INDEPENDENT_AMBULATORY_CARE_PROVIDER_SITE_OTHER): Payer: BC Managed Care – PPO | Admitting: Cardiovascular Disease

## 2013-09-19 ENCOUNTER — Encounter: Payer: Self-pay | Admitting: Cardiovascular Disease

## 2013-09-19 VITALS — BP 190/89 | HR 64 | Ht 68.0 in | Wt 238.0 lb

## 2013-09-19 DIAGNOSIS — I4892 Unspecified atrial flutter: Secondary | ICD-10-CM

## 2013-09-19 DIAGNOSIS — I5022 Chronic systolic (congestive) heart failure: Secondary | ICD-10-CM

## 2013-09-19 DIAGNOSIS — I10 Essential (primary) hypertension: Secondary | ICD-10-CM

## 2013-09-19 DIAGNOSIS — I25810 Atherosclerosis of coronary artery bypass graft(s) without angina pectoris: Secondary | ICD-10-CM

## 2013-09-19 MED ORDER — LOSARTAN POTASSIUM 100 MG PO TABS: 100.0000 mg | ORAL_TABLET | Freq: Every day | ORAL | Status: DC

## 2013-09-19 MED ORDER — AMLODIPINE BESYLATE 2.5 MG PO TABS: 2.5000 mg | ORAL_TABLET | Freq: Every day | ORAL | Status: DC

## 2013-09-19 NOTE — Patient Instructions (Signed)
Increase Losartan to 100 mg once daily.  Start Amlodipine 2.5 mg once daily.   Follow up in 3 months.

## 2013-09-19 NOTE — Progress Notes (Signed)
HPI  This is a 57 year old man who is here today for a followup visit regarding coronary artery disease and chronic systolic heart failure due to ischemic cardiomyopathy. He has remote history of CABG with multiple subsequent cardiac catheterizations and PCI. He presented in June of 2014 with non-ST elevation myocardial infarction and was transferred to [facility]. He underwent cardiac catheterization by Dr. Fransico Thad which showed significant left main and three-vessel coronary artery disease (nondominant RCA) with patent LIMA to LAD and occluded SVG to OM (new) and SVG to PDA(old). Attempted PCI was done on the left main and left circumflex. The procedure was very difficult due to heavy calcifications. It was unsuccessful due to inability to deliver a stent to the left circumflex. A small 2.5 mm drug-eluting stent was placed in the distal left main extending into the ostial left circumflex.  He has known history of diabetes and peripheral vascular disease with right below the knee amputation. He also has history of paroxysmal atrial flutter documented during his previous admissions at [facility]. He has been taking his medications regularly.  He presented again in July to [facility] with a small non-ST elevation myocardial infarction. I repeated cardiac catheterization which showed subtotally occluded  left circumflex at the site of recent PCI at [facility]. Ejection fraction was 35%. LIMA was patent and provided reasonable collaterals to the RCA. I discussed the case  during cath Conference. Medical therapy was recommended. He is overall feeling better. He denies chest pain or dyspnea.   Unfortunately, his younger brother died suddenly at all this from myocardial infarction. They were living together and the patient used to rely on him for assistance given his previous amputation. He has been under stress since then.   No Known Allergies   Current Outpatient Prescriptions on File Prior to Visit  Medication Sig  Dispense Refill  . ALPRAZolam (XANAX) 1 MG tablet Take 1 mg by mouth at bedtime as needed. anxiety       . aspirin 325 MG tablet Take 325 mg by mouth daily.      . carvedilol (COREG) 3.125 MG tablet Take 1 tablet (3.125 mg total) by mouth 2 (two) times daily with a meal.  60 tablet  6  . clopidogrel (PLAVIX) 75 MG tablet Take 75 mg by mouth daily.      Marland Kitchen glimepiride (AMARYL) 2 MG tablet Take 2 mg by mouth daily before breakfast.      . loratadine (CLARITIN) 10 MG tablet Take 10 mg by mouth daily.      . simvastatin (ZOCOR) 10 MG tablet Take 10 mg by mouth at bedtime.       No current facility-administered medications on file prior to visit.     Past Medical History  Diagnosis Date  . HTN (hypertension)   . 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 Anxiety   . Headache(784.0)   . NSTEMI (non-ST elevated myocardial infarction) 10/17/2011  . Diabetes mellitus   . Pulmonary emphysema   . Obesity   . Paroxysmal atrial flutter 10/15/2011  . CAD (coronary artery disease), autologous vein bypass graft     s/p CABG in 80's or 90's, multiple caths/PCI. Presented in June of 2014 with non-ST elevation myocardial infarction. Cardiac catheterization done by Dr. Fransico Anthonio at [facility] showed severe three-vessel coronary artery disease (nondominant RCA )with patent LIMA to LAD, occluded SVG to OM1(new) and occluded SVG to PDA. There  was significant ostial and distal left main stenosis. Attempted left main/LCX PCI  . MI (myocardial infarction)   . Chronic systolic heart failure     Ejection fraction 35%     Past Surgical History  Procedure Laterality Date  . Coronary artery bypass graft    . No past surgeries    . Amputation  10/17/2011    Procedure: AMPUTATION RAY;  Surgeon: Cammy Copa;  Location: [facility] OR;  Service: Orthopedics;  Laterality: Right;  First and Second Ray Amputation  . Amputation  11/01/2011    Procedure:  AMPUTATION BELOW KNEE;  Surgeon: Nadara Mustard, MD;  Location: [facility] OR;  Service: Orthopedics;  Laterality: Right;  Right Below Knee Amputation  . Rt bka    . Cardiac catheterization  09/2006    [facility];EF 60%  . Cardiac catheterization  05/30/13    [facility]: Occluded SVG to RCA and SVG to OM (both chronic) , patent LIMA to LAD which gives collaterals to LCx and RCA. Subtotal occlusion of the native left circumflex at the site of PCI done at [facility]. Ejection fraction 55%     Family History  Problem Relation Age of Onset  . Alzheimer's disease Father   . Melanoma Father   . Benign prostatic hyperplasia Father   . Coronary artery disease Father     s/p CABG  . Heart attack Brother 57    MI  . Coronary artery disease Brother     s/p CABG  . Lymphoma Sister     non Hodgkin's   . Aortic aneurysm Mother   . Anemia Mother      History   Social History  . Marital Status: Single    Spouse Name: N/A    Number of Children: N/A  . Years of Education: N/A   Occupational History  . Not on file.   Social History Main Topics  . Smoking status: Former Smoker -- 0.50 packs/day for 35 years    Types: Cigarettes    Quit date: 07/02/2001  . Smokeless tobacco: Never Used  . Alcohol Use: No  . Drug Use: No  . Sexual Activity: Not Currently   Other Topics Concern  . Not on file   Social History Narrative   Difficult home situation because he cares for his dad with Alzheimer's, younger brother lives at home as well but doesn't get along with the dad at all and patient is in between the two, creating significant stress. Pt is concerned about who will care for the dad while he is in the hospital, is requesting help. He is single and has no children and only source of help is his niece.       PHYSICAL EXAM   BP 190/89  Pulse 64  Ht 5\' 8"  (1.727 m)  Wt 238 lb (107.956 kg)  BMI 36.20 kg/m2 Constitutional: He is oriented to person, place, and time. He appears well-developed and well-nourished.  No distress.  HENT: No nasal discharge.  Head: Normocephalic and atraumatic.  Eyes: Pupils are equal and round. Right eye exhibits no discharge. Left eye exhibits no discharge.  Neck: Normal range of motion. Neck supple. No JVD present. No thyromegaly present.  Cardiovascular: Normal rate, regular rhythm, normal heart sounds and. Exam reveals no gallop and no friction rub. No murmur heard.  Pulmonary/Chest: Effort normal and breath sounds normal. No stridor. No respiratory distress. He has no wheezes. He has no rales. He exhibits no tenderness.  Abdominal: Soft. Bowel sounds are normal. He  exhibits no distension. There is no tenderness. There is no rebound and no guarding.  Musculoskeletal: Normal range of motion. He exhibits no edema and no tenderness.  Neurological: He is alert and oriented to person, place, and time. Coordination normal.  Skin: Skin is warm and dry. No rash noted. He is not diaphoretic. No erythema. No pallor.  Psychiatric: He has a normal mood and affect. His behavior is normal. Judgment and thought content normal.       EKG: Sinus  Rhythm  -Nonspecific QRS widening.   - Inferior -lateral infarct  (age undetermined).   -  T-abnormality  - Anterior/lateral ischemia.   ABNORMAL    ASSESSMENT AND PLAN

## 2013-09-21 NOTE — Assessment & Plan Note (Signed)
He appears to be euvolemic. I cannot increase carvedilol due to his known bradycardia. I increased losartan 100 mg once daily and added amlodipine 2.5 mg once daily for blood pressure control.

## 2013-09-21 NOTE — Assessment & Plan Note (Signed)
Blood pressure is elevated. Increased losartan and added amlodipine as outlined above.

## 2013-09-21 NOTE — Assessment & Plan Note (Signed)
He is overall stable with no worsening angina. Continue medical therapy. He needs optimal blood pressure control.

## 2013-12-22 ENCOUNTER — Ambulatory Visit: Payer: BC Managed Care – PPO | Admitting: Cardiovascular Disease

## 2013-12-29 ENCOUNTER — Ambulatory Visit: Payer: BC Managed Care – PPO | Admitting: Cardiovascular Disease

## 2014-01-05 ENCOUNTER — Ambulatory Visit: Payer: BC Managed Care – PPO | Admitting: Cardiovascular Disease

## 2014-01-08 ENCOUNTER — Ambulatory Visit: Payer: BC Managed Care – PPO | Admitting: Cardiovascular Disease

## 2014-01-23 ENCOUNTER — Ambulatory Visit (INDEPENDENT_AMBULATORY_CARE_PROVIDER_SITE_OTHER): Payer: BC Managed Care – PPO | Admitting: Cardiovascular Disease

## 2014-01-23 ENCOUNTER — Encounter: Payer: Self-pay | Admitting: Cardiovascular Disease

## 2014-01-23 VITALS — BP 158/84 | HR 73 | Ht 68.0 in | Wt 238.0 lb

## 2014-01-23 DIAGNOSIS — I10 Essential (primary) hypertension: Secondary | ICD-10-CM

## 2014-01-23 DIAGNOSIS — R079 Chest pain, unspecified: Secondary | ICD-10-CM

## 2014-01-23 DIAGNOSIS — I5022 Chronic systolic (congestive) heart failure: Secondary | ICD-10-CM

## 2014-01-23 DIAGNOSIS — I25810 Atherosclerosis of coronary artery bypass graft(s) without angina pectoris: Secondary | ICD-10-CM

## 2014-01-23 DIAGNOSIS — E785 Hyperlipidemia, unspecified: Secondary | ICD-10-CM

## 2014-01-23 MED ORDER — AMLODIPINE BESYLATE 5 MG PO TABS: 5.0000 mg | ORAL_TABLET | Freq: Every day | ORAL | Status: DC

## 2014-01-23 MED ORDER — ASPIRIN EC 81 MG PO TBEC: 81.0000 mg | DELAYED_RELEASE_TABLET | Freq: Every day | ORAL | Status: AC

## 2014-01-23 NOTE — Patient Instructions (Signed)
Your physician has recommended you make the following change in your medication:  Decrease Aspirin to 81 mg daily Increase Amlodipine to 5 mg daily   Your physician wants you to follow-up in: 6 months You will receive a reminder letter in the mail two months in advance. If you don't receive a letter, please call our office to schedule the follow-up appointment.

## 2014-01-23 NOTE — Progress Notes (Signed)
HPI  This is a 58 year old man who is here today for a followup visit regarding coronary artery disease and chronic systolic heart failure due to ischemic cardiomyopathy. He has remote history of CABG with multiple subsequent cardiac catheterizations and PCI. He presented in June of 2014 with non-ST elevation myocardial infarction and was transferred to [facility]. He underwent cardiac catheterization by Dr. Fransico Trindon which showed significant left main and three-vessel coronary artery disease (nondominant RCA) with patent LIMA to LAD and occluded SVG to OM (new) and SVG to PDA(old). Attempted PCI was done on the left main and left circumflex. The procedure was very difficult due to heavy calcifications. It was unsuccessful due to inability to deliver a stent to the left circumflex. A small 2.5 mm drug-eluting stent was placed in the distal left main extending into the ostial left circumflex.  He has known history of diabetes and peripheral vascular disease with right below the knee amputation. He also has history of paroxysmal atrial flutter documented during his previous admissions at [facility]. He has been taking his medications regularly.  He presented again in July, 2014 to [facility] with a small non-ST elevation myocardial infarction. I repeated cardiac catheterization which showed subtotally occluded  left circumflex at the site of  PCI . Ejection fraction was 35%. LIMA was patent and provided reasonable collaterals to the RCA. I discussed the case  during cath Conference. Medical therapy was recommended. He is overall feeling better. He denies chest pain or dyspnea.   Blood pressure was elevated during last visit. I added amlodipine.   No Known Allergies   Current Outpatient Prescriptions on File Prior to Visit  Medication Sig Dispense Refill  . amLODipine (NORVASC) 2.5 MG tablet Take 1 tablet (2.5 mg total) by mouth daily.  30 tablet  6  . aspirin 325 MG tablet Take 325 mg by mouth daily.      .  carvedilol (COREG) 3.125 MG tablet Take 1 tablet (3.125 mg total) by mouth 2 (two) times daily with a meal.  60 tablet  6  . clopidogrel (PLAVIX) 75 MG tablet Take 75 mg by mouth daily.      Marland Kitchen glimepiride (AMARYL) 2 MG tablet Take 2 mg by mouth daily before breakfast.      . insulin detemir (LEVEMIR) 100 UNIT/ML injection Inject 10 Units into the skin daily.      Marland Kitchen loratadine (CLARITIN) 10 MG tablet Take 10 mg by mouth daily.      Marland Kitchen losartan (COZAAR) 100 MG tablet Take 1 tablet (100 mg total) by mouth daily.  30 tablet  6  . simvastatin (ZOCOR) 10 MG tablet Take 10 mg by mouth at bedtime.       No current facility-administered medications on file prior to visit.     Past Medical History  Diagnosis Date  . HTN (hypertension)   . 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 Anxiety   . Headache(784.0)   . NSTEMI (non-ST elevated myocardial infarction) 10/17/2011  . Diabetes mellitus   . Pulmonary emphysema   . Obesity   . Paroxysmal atrial flutter 10/15/2011  . CAD (coronary artery disease), autologous vein bypass graft     s/p CABG in 80's or 90's, multiple caths/PCI. Presented in June of 2014 with non-ST elevation myocardial infarction. Cardiac catheterization done by Dr. Fransico Giovan at [facility] showed severe three-vessel coronary artery disease (nondominant RCA )with patent LIMA  to LAD, occluded SVG to OM1(new) and occluded SVG to PDA. There was significant ostial and distal left main stenosis. Attempted left main/LCX PCI  . MI (myocardial infarction)   . Chronic systolic heart failure     Ejection fraction 35%     Past Surgical History  Procedure Laterality Date  . Coronary artery bypass graft    . No past surgeries    . Amputation  10/17/2011    Procedure: AMPUTATION RAY;  Surgeon: Cammy CopaGregory Scott Dean;  [facility];  Service: Orthopedics;  Laterality: Right;  First and Second Ray Amputation  . Amputation   11/01/2011    Procedure: AMPUTATION BELOW KNEE;  Surgeon: Nadara MustardMarcus V Duda, MD;  Location: [facility] OR;  Service: Orthopedics;  Laterality: Right;  Right Below Knee Amputation  . Rt bka    . Cardiac catheterization  09/2006    [facility];EF 60%  . Cardiac catheter[facility]  05/30/13    [facility]: Occluded SVG to RCA and SVG to OM (both chronic) , patent LIMA to LAD which gives collaterals to LCx and RCA. Subtotal occlusion of the native left circumflex at the site of PCI done [facility]. Ejection fraction 55%     Family History  Problem Relation Age of Onset  . Alzheimer's disease Father   . Melanoma Father   . Benign prostatic hyperplasia Father   . Coronary artery disease Father     s/p CABG  . Heart attack Brother 57    MI  . Coronary artery disease Brother     s/p CABG  . Lymphoma Sister     non Hodgkin's   . Aortic aneurysm Mother   . Anemia Mother      History   Social History  . Marital Status: Single    Spouse Name: N/A    Number of Children: N/A  . Years of Education: N/A   Occupational History  . Not on file.   Social History Main Topics  . Smoking status: Former Smoker -- 0.50 packs/day for 35 years    Types: Cigarettes    Quit date: 07/02/2001  . Smokeless tobacco: Never Used  . Alcohol Use: No  . Drug Use: No  . Sexual Activity: Not Currently   Other Topics Concern  . Not on file   Social History Narrative   Difficult home situation because he cares for his dad with Alzheimer's, younger brother lives at home as well but doesn't get along with the dad at all and patient is in between the two, creating significant stress. Pt is concerned about who will care for the dad while he is in the hospital, is requesting help. He is single and has no children and only source of help is his niece.       PHYSICAL EXAM   BP 158/84  Pulse 73  Ht 5\' 8"  (1.727 m)  Wt 238 lb (107.956 kg)  BMI 36.20 kg/m2 Constitutional: He is oriented to person, place, and time. He appears  well-developed and well-nourished. No distress.  HENT: No nasal discharge.  Head: Normocephalic and atraumatic.  Eyes: Pupils are equal and round. Right eye exhibits no discharge. Left eye exhibits no discharge.  Neck: Normal range of motion. Neck supple. No JVD present. No thyromegaly present.  Cardiovascular: Normal rate, regular rhythm, normal heart sounds and. Exam reveals no gallop and no friction rub. No murmur heard.  Pulmonary/Chest: Effort normal and breath sounds normal. No stridor. No respiratory distress. He has no wheezes. He has no rales.  He exhibits no tenderness.  Abdominal: Soft. Bowel sounds are normal. He exhibits no distension. There is no tenderness. There is no rebound and no guarding.  Musculoskeletal: Normal range of motion. He exhibits no edema and no tenderness.  Neurological: He is alert and oriented to person, place, and time. Coordination normal.  Skin: Skin is warm and dry. No rash noted. He is not diaphoretic. No erythema. No pallor.  Psychiatric: He has a normal mood and affect. His behavior is normal. Judgment and thought content normal.       ZOX:WRUEAV sinus rhythm - Inferior -lateral infarct  (age undetermined).   -  Negative T-waves  - Lateral ischemia.   ABNORMAL    ASSESSMENT AND PLAN

## 2014-01-24 DIAGNOSIS — E785 Hyperlipidemia, unspecified: Secondary | ICD-10-CM | POA: Insufficient documentation

## 2014-01-24 NOTE — Assessment & Plan Note (Signed)
He appears to be euvolemic on current therapy.

## 2014-01-24 NOTE — Assessment & Plan Note (Signed)
Continue treatment with simvastatin but should consider a more potent statin. I recommend a target LDL of less than 70.

## 2014-01-24 NOTE — Assessment & Plan Note (Signed)
Blood pressure is elevated. I increased amlodipine 5 mg once daily. Continue treatment with carvedilol and losartan.

## 2014-01-24 NOTE — Assessment & Plan Note (Signed)
He is doing reasonably well. Angina is controlled with medical therapy. I increased amlodipine today in order to improve blood pressure control.

## 2014-02-18 ENCOUNTER — Other Ambulatory Visit: Payer: Self-pay

## 2014-02-18 ENCOUNTER — Other Ambulatory Visit: Payer: Self-pay | Admitting: Cardiovascular Disease

## 2014-02-18 MED ORDER — CARVEDILOL 3.125 MG PO TABS: ORAL_TABLET | ORAL | Status: DC

## 2014-02-18 NOTE — Telephone Encounter (Signed)
Requested Prescriptions   Signed Prescriptions Disp Refills  . carvedilol (COREG) 3.125 MG tablet 60 tablet 5    Sig: TAKE ONE TABLET BY MOUTH TWICE DAILY WITH A MEAL    Authorizing Provider: Lorine BearsARIDA, MUHAMMAD A    Ordering User: Kendrick FriesLOPEZ, Elfie Costanza C

## 2014-04-28 ENCOUNTER — Other Ambulatory Visit: Payer: Self-pay | Admitting: *Deleted

## 2014-04-28 MED ORDER — CLOPIDOGREL BISULFATE 75 MG PO TABS: 75.0000 mg | ORAL_TABLET | Freq: Every day | ORAL | Status: DC

## 2014-06-21 ENCOUNTER — Emergency Department: Payer: Self-pay | Admitting: Emergency Medicine

## 2014-07-07 ENCOUNTER — Other Ambulatory Visit: Payer: Self-pay | Admitting: Cardiovascular Disease

## 2014-07-24 ENCOUNTER — Ambulatory Visit (INDEPENDENT_AMBULATORY_CARE_PROVIDER_SITE_OTHER): Payer: Medicare PPO | Admitting: Cardiovascular Disease

## 2014-07-24 ENCOUNTER — Encounter: Payer: Self-pay | Admitting: Cardiovascular Disease

## 2014-07-24 VITALS — BP 158/90 | HR 108 | Ht 68.0 in | Wt 198.0 lb

## 2014-07-24 DIAGNOSIS — I480 Paroxysmal atrial fibrillation: Secondary | ICD-10-CM

## 2014-07-24 DIAGNOSIS — I4892 Unspecified atrial flutter: Secondary | ICD-10-CM | POA: Diagnosis not present

## 2014-07-24 DIAGNOSIS — I25810 Atherosclerosis of coronary artery bypass graft(s) without angina pectoris: Secondary | ICD-10-CM

## 2014-07-24 DIAGNOSIS — I4891 Unspecified atrial fibrillation: Secondary | ICD-10-CM

## 2014-07-24 DIAGNOSIS — I10 Essential (primary) hypertension: Secondary | ICD-10-CM

## 2014-07-24 DIAGNOSIS — R0602 Shortness of breath: Secondary | ICD-10-CM

## 2014-07-24 DIAGNOSIS — I5022 Chronic systolic (congestive) heart failure: Secondary | ICD-10-CM

## 2014-07-24 DIAGNOSIS — I25719 Atherosclerosis of autologous vein coronary artery bypass graft(s) with unspecified angina pectoris: Secondary | ICD-10-CM

## 2014-07-24 DIAGNOSIS — I209 Angina pectoris, unspecified: Secondary | ICD-10-CM

## 2014-07-24 MED ORDER — APIXABAN 5 MG PO TABS: 5.0000 mg | ORAL_TABLET | Freq: Two times a day (BID) | ORAL | Status: DC

## 2014-07-24 MED ORDER — CARVEDILOL 6.25 MG PO TABS: 6.2500 mg | ORAL_TABLET | Freq: Two times a day (BID) | ORAL | Status: DC

## 2014-07-24 NOTE — Assessment & Plan Note (Signed)
The patient is noted to be in atrial fibrillation with mild rapid ventricular response today. This is likely the culprit for his worsening dyspnea. I increased the dose of carvedilol to 6.25 mg for better rate control. I discussed with him the indication for anticoagulation. CHADS2 VASC score is 4. Thus, I recommend long-term anticoagulation. I started him on Apixaban 5 mg twice daily. He is to return on 3 weeks for labs. If he remains in atrial fibrillation, I will plan to cardiovert him.

## 2014-07-24 NOTE — Assessment & Plan Note (Signed)
Blood pressure is elevated. I increased the dose of carvedilol.

## 2014-07-24 NOTE — Assessment & Plan Note (Signed)
He appears to be euvolemic. 

## 2014-07-24 NOTE — Assessment & Plan Note (Signed)
Due to initiation of anticoagulation, I discontinued Plavix. Continue medical therapy.

## 2014-07-24 NOTE — Progress Notes (Signed)
HPI  This is a 58 year old man who is here today for a followup visit regarding coronary artery disease and chronic systolic heart failure due to ischemic cardiomyopathy. He has remote history of CABG with multiple subsequent cardiac catheterizations and PCI. He presented in June of 2014 with non-ST elevation myocardial infarction and was transferred to [facility]. He underwent cardiac catheterization by Dr. Fransico Kiran which showed significant left main and three-vessel coronary artery disease (nondominant RCA) with patent LIMA to LAD and occluded SVG to OM (new) and SVG to PDA(old). Attempted PCI was done on the left main and left circumflex. The procedure was very difficult due to heavy calcifications. It was unsuccessful due to inability to deliver a stent to the left circumflex. A small 2.5 mm drug-eluting stent was placed in the distal left main extending into the ostial left circumflex.  He has known history of diabetes and peripheral vascular disease with right below the knee amputation. He also has history of paroxysmal atrial flutter documented during his previous admissions at [facility]. He has been taking his medications regularly.  He presented again in July, 2014 to [facility] with a small non-ST elevation myocardial infarction. I repeated cardiac catheterization which showed subtotally occluded  left circumflex at the site of  PCI . Ejection fraction was 35%. LIMA was patent and provided reasonable collaterals to the RCA. I discussed the case  during cath Conference. Medical therapy was recommended.  He noted significant worsening of dyspnea over the last few weeks. He is noted to be in atrial fibrillation today. He denies palpitations. No chest discomfort.  No Known Allergies   Current Outpatient Prescriptions on File Prior to Visit  Medication Sig Dispense Refill  . amLODipine (NORVASC) 5 MG tablet Take 1 tablet (5 mg total) by mouth daily.  90 tablet  3  . aspirin EC 81 MG tablet Take 1 tablet  (81 mg total) by mouth daily.  90 tablet  3  . carvedilol (COREG) 3.125 MG tablet TAKE ONE TABLET BY MOUTH TWICE DAILY WITH A MEAL  60 tablet  5  . clopidogrel (PLAVIX) 75 MG tablet Take 1 tablet (75 mg total) by mouth daily.  30 tablet  3  . glimepiride (AMARYL) 2 MG tablet Take 2 mg by mouth daily before breakfast.      . insulin detemir (LEVEMIR) 100 UNIT/ML injection Inject 10 Units into the skin daily.      Marland Kitchen loratadine (CLARITIN) 10 MG tablet Take 10 mg by mouth daily.      Marland Kitchen losartan (COZAAR) 100 MG tablet TAKE ONE TABLET BY MOUTH ONCE DAILY  30 tablet  3  . NITROSTAT 0.4 MG SL tablet       . simvastatin (ZOCOR) 10 MG tablet Take 10 mg by mouth at bedtime.       No current facility-administered medications on file prior to visit.     Past Medical History  Diagnosis Date  . HTN (hypertension)   . 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 Anxiety   . Headache(784.0)   . NSTEMI (non-ST elevated myocardial infarction) 10/17/2011  . Diabetes mellitus   . Pulmonary emphysema   . Obesity   . Paroxysmal atrial flutter 10/15/2011  . CAD (coronary artery disease), autologous vein bypass graft     s/p CABG in 80's or 90's, multiple caths/PCI. Presented in June of 2014 with non-ST elevation myocardial infarction. Cardiac catheterization  done by [facility]. Fransico Bunyan at [facility] showed severe three-vessel coronary artery disease (nondominant RCA )with patent LIMA to LAD, occluded SVG to OM1(new) and occluded SVG to PDA. There was significant ostial and distal left main stenosis. Attempted left main/LCX PCI  . MI (myocardial infarction)   . Chronic systolic heart failure     Ejection fraction 35%  . Broken femur      Past Surgical History  Procedure Laterality Date  . Coronary artery bypass graft    . No past surgeries    . Amputation  10/17/2011    Procedure: AMPUTATION RAY;  Surgeon: Cammy Copa;  Location: [facility] OR;   Service: Orthopedics;  Laterality: Right;  First and Second Ray Amputation  . Amputation  11/01/2011    Procedure: AMPUTATION BELOW KNEE;  Surgeon: Nadara Mustard, MD;  Location: [facility] OR;  Service: Orthopedics;  Laterality: Right;  Right Below Knee Amputation  . Rt bka    . Cardiac catheterization  09/2006    [facility];EF 60%  . Cardiac catheterization  05/30/13    [facility]: Occluded SVG to RCA and SVG to OM (both chronic) , patent LIMA to LAD which gives collaterals to LCx and RCA. Subtotal occlusion of the native left circumflex at the site of PCI done at [facility]. Ejection fraction 55%     Family History  Problem Relation Age of Onset  . Alzheimer's disease Father   . Melanoma Father   . Benign prostatic hyperplasia Father   . Coronary artery disease Father     s/p CABG  . Heart attack Brother 57    MI  . Coronary artery disease Brother     s/p CABG  . Lymphoma Sister     non Hodgkin's   . Aortic aneurysm Mother   . Anemia Mother      History   Social History  . Marital Status: Single    Spouse Name: N/A    Number of Children: N/A  . Years of Education: N/A   Occupational History  . Not on file.   Social History Main Topics  . Smoking status: Former Smoker -- 0.50 packs/day for 35 years    Types: Cigarettes    Quit date: 07/02/2001  . Smokeless tobacco: Never Used  . Alcohol Use: No  . Drug Use: No  . Sexual Activity: Not Currently   Other Topics Concern  . Not on file   Social History Narrative   Difficult home situation because he cares for his dad with Alzheimer's, younger brother lives at home as well but doesn't get along with the dad at all and patient is in between the two, creating significant stress. Pt is concerned about who will care for the dad while he is in the hospital, is requesting help. He is single and has no children and only source of help is his niece.       PHYSICAL EXAM   BP 158/90  Pulse 108  Ht  (1.727 m)  Wt 198 lb (89.812 kg)  BMI  30.11 kg/m2 Constitutional: He is oriented to person, place, and time. He appears well-developed and well-nourished. No distress.  HENT: No nasal discharge.  Head: Normocephalic and atraumatic.  Eyes: Pupils are equal and round. Right eye exhibits no discharge. Left eye exhibits no discharge.  Neck: Normal range of motion. Neck supple. No JVD present. No thyromegaly present.  Cardiovascular: Tachycardic, irregular rhythm, normal heart sounds and. Exam reveals no gallop and no friction rub. No murmur heard.  Pulmonary/Chest: Effort normal and breath sounds normal. No stridor. No respiratory distress. He has no wheezes. He has no rales. He exhibits no tenderness.  Abdominal: Soft. Bowel sounds are normal. He exhibits no distension. There is no tenderness. There is no rebound and no guarding.  Musculoskeletal: Normal range of motion. He exhibits no edema and no tenderness.  Neurological: He is alert and oriented to person, place, and time. Coordination normal.  Skin: Skin is warm and dry. No rash noted. He is not diaphoretic. No erythema. No pallor.  Psychiatric: He has a normal mood and affect. His behavior is normal. Judgment and thought content normal.       ZOX:WRUEAV fibrillation  -Intraventricular conduction defect -consider left ventricular hypertrophy.   -Diffuse ST depression   +   Nonspecific T-abnormality  -Nondiagnostic -possible digitalis effect, -consider subendocardial injury/ischemia.   ABNORMAL     ASSESSMENT AND PLAN

## 2014-07-24 NOTE — Patient Instructions (Signed)
Your physician has recommended you make the following change in your medication:  Stop Plavix  Start Eliquis 5 mg twice daily Increase Coreg to 6.25 mg twice daily   Your physician recommends that you schedule a follow-up appointment in:  3 weeks

## 2014-07-27 ENCOUNTER — Telehealth: Payer: Self-pay | Admitting: *Deleted

## 2014-07-27 NOTE — Telephone Encounter (Signed)
Pt requires prior auth. For Eliquis 5 mg bid. Form filled out and faxed to [facility] [phone] Awaiting Approval

## 2014-07-28 NOTE — Telephone Encounter (Signed)
Disregard message below. Pt already received medication and has been approved by [employer]. No need for prior authorization.

## 2014-08-14 ENCOUNTER — Ambulatory Visit: Payer: BC Managed Care – PPO | Admitting: Cardiovascular Disease

## 2014-08-24 ENCOUNTER — Ambulatory Visit: Payer: Self-pay | Admitting: Cardiovascular Disease

## 2014-08-24 ENCOUNTER — Ambulatory Visit (INDEPENDENT_AMBULATORY_CARE_PROVIDER_SITE_OTHER): Payer: Medicare PPO | Admitting: Cardiovascular Disease

## 2014-08-24 ENCOUNTER — Encounter: Payer: Self-pay | Admitting: Cardiovascular Disease

## 2014-08-24 VITALS — BP 124/84 | HR 94 | Ht 68.0 in | Wt 198.0 lb

## 2014-08-24 DIAGNOSIS — I4892 Unspecified atrial flutter: Secondary | ICD-10-CM | POA: Diagnosis not present

## 2014-08-24 DIAGNOSIS — I4891 Unspecified atrial fibrillation: Secondary | ICD-10-CM

## 2014-08-24 DIAGNOSIS — I5022 Chronic systolic (congestive) heart failure: Secondary | ICD-10-CM

## 2014-08-24 DIAGNOSIS — I10 Essential (primary) hypertension: Secondary | ICD-10-CM

## 2014-08-24 DIAGNOSIS — I25718 Atherosclerosis of autologous vein coronary artery bypass graft(s) with other forms of angina pectoris: Secondary | ICD-10-CM

## 2014-08-24 LAB — TSH: Thyroid Stimulating Horm: 1.88 u[IU]/mL

## 2014-08-24 LAB — CBC WITH DIFFERENTIAL/PLATELET
Basophil #: 0.1 10*3/uL (ref 0.0–0.1)
Basophil %: 1.2 %
Eosinophil #: 0.6 10*3/uL (ref 0.0–0.7)
Eosinophil %: 5.5 %
HCT: 44.9 % (ref 40.0–52.0)
HGB: 14.3 g/dL (ref 13.0–18.0)
Lymphocyte #: 1.6 10*3/uL (ref 1.0–3.6)
Lymphocyte %: 14.7 %
MCH: 27.4 pg (ref 26.0–34.0)
MCHC: 31.9 g/dL — AB (ref 32.0–36.0)
MCV: 86 fL (ref 80–100)
MONOS PCT: 7.2 %
Monocyte #: 0.8 x10 3/mm (ref 0.2–1.0)
Neutrophil #: 7.7 10*3/uL — ABNORMAL HIGH (ref 1.4–6.5)
Neutrophil %: 71.4 %
PLATELETS: 179 10*3/uL (ref 150–440)
RBC: 5.22 10*6/uL (ref 4.40–5.90)
RDW: 16.7 % — ABNORMAL HIGH (ref 11.5–14.5)
WBC: 10.8 10*3/uL — ABNORMAL HIGH (ref 3.8–10.6)

## 2014-08-24 LAB — BASIC METABOLIC PANEL
Anion Gap: 11 (ref 7–16)
BUN: 21 mg/dL — AB (ref 7–18)
Calcium, Total: 9.5 mg/dL (ref 8.5–10.1)
Chloride: 91 mmol/L — ABNORMAL LOW (ref 98–107)
Co2: 30 mmol/L (ref 21–32)
Creatinine: 1.41 mg/dL — ABNORMAL HIGH (ref 0.60–1.30)
EGFR (African American): >60
EGFR (Non-African Amer.): 54 — ABNORMAL LOW
Glucose: 492 mg/dL — ABNORMAL HIGH (ref 65–99)
Osmolality: 289 (ref 275–301)
Potassium: 5.1 mmol/L (ref 3.5–5.1)
Sodium: 132 mmol/L — ABNORMAL LOW (ref 136–145)

## 2014-08-24 MED ORDER — CARVEDILOL 12.5 MG PO TABS: 12.5000 mg | ORAL_TABLET | Freq: Two times a day (BID) | ORAL | Status: DC

## 2014-08-24 NOTE — Assessment & Plan Note (Signed)
Ventricular rate is more well controlled but still not optimal. I increased the dose of carvedilol to 12.5 mg twice daily. Continue anticoagulation. Check routine labs today including TSH.

## 2014-08-24 NOTE — Assessment & Plan Note (Signed)
He appears to be euvolemic. He is currently [place] Heart Association class III. Recent decompensation was due to atrial fibrillation with RVR. Symptoms improved with rate control.

## 2014-08-24 NOTE — Assessment & Plan Note (Signed)
He denies chest pain. Continue medical therapy.

## 2014-08-24 NOTE — Patient Instructions (Signed)
Your physician has recommended you make the following change in your medication:  Stop Amlodipine  Increase Coreg to 12.5 mg twice daily   Your physician recommends that you have labs today:  CBC  BMP TSH   Your physician recommends that you schedule a follow-up appointment in:  3 months

## 2014-08-24 NOTE — Progress Notes (Signed)
HPI  This is a 58 year old man who is here today for a followup visit regarding coronary artery disease, atrial fibrillation and chronic systolic heart failure due to ischemic cardiomyopathy. He has remote history of CABG with multiple subsequent cardiac catheterizations and PCI. He presented in June of 2014 with non-ST elevation myocardial infarction and was [facility] to [facility]. He underwent cardiac catheterization by Dr. Fransico MichaelBrennan which showed significant left main and three-vessel coronary artery disease (nondominant RCA) with patent LIMA to LAD and occluded SVG to OM (new) and SVG to PDA(old). Attempted PCI was done on the left main and left circumflex. The procedure was very difficult due to heavy calcifications. It was unsuccessful due to inability to deliver a stent to the left circumflex. A small 2.5 mm drug-eluting stent was placed in the distal left main extending into the ostial left circumflex.  He has known history of diabetes and peripheral vascular disease with right below the knee amputation. He also has history of paroxysmal atrial flutter documented during his [facility] admissions [facility]. He has been taking his medications regularly.  He presented again in July, 2014 to [facility] with a small non-ST elevation myocardial infarction. I repeated cardiac catheterization which showed subtotally occluded  left circumflex at the site of  PCI . Ejection fraction was 35%. LIMA was patent and provided reasonable collaterals to the RCA. I discussed the case  during cath Conference. Medical therapy was recommended.  He had worsening of dyspnea last month and was noted to be in atrial fibrillation . I increased Coreg and started anticoagulation with Eliquis. He feels significantly better but still with occasional tachycardia.   No Known Allergies   Current Outpatient Prescriptions on File Prior to Visit  Medication Sig Dispense Refill  . amLODipine (NORVASC) 5 MG tablet Take 1 tablet (5 mg  total) by mouth daily.  90 tablet  3  . apixaban (ELIQUIS) 5 MG TABS tablet Take 1 tablet (5 mg total) by mouth 2 (two) times daily.  60 tablet  6  . aspirin EC 81 MG tablet Take 1 tablet (81 mg total) by mouth daily.  90 tablet  3  . carvedilol (COREG) 6.25 MG tablet Take 1 tablet (6.25 mg total) by mouth 2 (two) times daily.  180 tablet  3  . glimepiride (AMARYL) 2 MG tablet Take 2 mg by mouth daily before breakfast.      . insulin detemir (LEVEMIR) 100 UNIT/ML injection Inject 10 Units into the skin daily.      Marland Kitchen. losartan (COZAAR) 100 MG tablet TAKE ONE TABLET BY MOUTH ONCE DAILY  30 tablet  3  . NITROSTAT 0.4 MG SL tablet       . simvastatin (ZOCOR) 10 MG tablet Take 10 mg by mouth at bedtime.      . traMADol (ULTRAM) 50 MG tablet Take 50 mg by mouth every 6 (six) hours as needed.        No current facility-administered medications on file prior to visit.     Past Medical History  Diagnosis Date  . HTN (hypertension)   . 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 Anxiety   . Headache(784.0)   . NSTEMI (non-ST elevated myocardial infarction) 10/17/2011  . Diabetes mellitus   . Pulmonary emphysema   . Obesity   . Paroxysmal atrial flutter 10/15/2011  . CAD (coronary artery disease), autologous vein bypass graft  s/p CABG in 80's or 90's, multiple caths/PCI. Presented in June of 2014 with non-ST elevation myocardial infarction. Cardiac catheterization done by Dr. Fransico Taison at [facility] showed severe three-vessel coronary artery disease (nondominant RCA )with patent LIMA to LAD, occluded SVG to OM1(new) and occluded SVG to PDA. There was significant ostial and distal left main stenosis. Attempted left main/LCX PCI  . MI (myocardial infarction)   . Chronic systolic heart failure     Ejection fraction 35%  . Broken femur      Past Surgical History  Procedure Laterality Date  . Coronary artery bypass graft    . No  past surgeries    . Amputation  10/17/2011    Procedure: AMPUTATION RAY;  Surgeon: Cammy Copa;  Location: [facility] OR;  Service: Orthopedics;  Laterality: Right;  First and Second Ray Amputation  . Amputation  11/01/2011    Procedure: AMPUTATION BELOW KNEE;  Surgeon: Nadara Mustard, MD;  Location: [facility] OR;  Service: Orthopedics;  Laterality: Right;  Right Below Knee Amputation  . Rt bka    . Cardiac catheterization  09/2006    [facility];EF 60%  . Cardiac [facility]  [facility]/13    [facility]: Occluded SVG to RCA and SVG to OM (both chronic) , patent LIMA to LAD which gives collaterals to LCx and RCA. Subtotal occlusion of the native left circumflex at the site of PCI done at [facility]. Ejection fraction 55%     Family History  Problem Relation Age of Onset  . Alzheimer's disease Father   . Melanoma Father   . Benign prostatic hyperplasia Father   . Coronary artery disease Father     s/p CABG  . Heart attack Brother 57    MI  . Coronary artery disease Brother     s/p CABG  . Lymphoma Sister     non Hodgkin's   . Aortic aneurysm Mother   . Anemia Mother      History   Social History  . Marital Status: Single    Spouse Name: N/A    Number of Children: N/A  . Years of Education: N/A   Occupational History  . Not on file.   Social History Main Topics  . Smoking status: Former Smoker -- 0.50 packs/day for 35 years    Types: Cigarettes    Quit date: 07/02/2001  . Smokeless tobacco: Never Used  . Alcohol Use: No  . Drug Use: No  . Sexual Activity: Not Currently   Other Topics Concern  . Not on file   Social History Narrative   Difficult home situation because he cares for his dad with Alzheimer's, younger brother lives at home as well but doesn't get along with the dad at all and patient is in between the two, creating significant stress. Pt is concerned about who will care for the dad while he is in the hospital, is requesting help. He is single and has no children and only source of  help is his niece.       PHYSICAL EXAM   BP 124/84  Ht 5\' 8"  (1.727 m)  Wt 198 lb (89.812 kg)  BMI 30.11 kg/m2 Constitutional: He is oriented to person, place, and time. He appears well-developed and well-nourished. No distress.  HENT: No nasal discharge.  Head: Normocephalic and atraumatic.  Eyes: Pupils are equal and round. Right eye exhibits no discharge. Left eye exhibits no discharge.  Neck: Normal range of motion. Neck supple. No JVD present. No thyromegaly present.  Cardiovascular: Normal  rate, irregular rhythm, normal heart sounds and. Exam reveals no gallop and no friction rub. No murmur heard.  Pulmonary/Chest: Effort normal and breath sounds normal. No stridor. No respiratory distress. He has no wheezes. He has no rales. He exhibits no tenderness.  Abdominal: Soft. Bowel sounds are normal. He exhibits no distension. There is no tenderness. There is no rebound and no guarding.  Musculoskeletal: Normal range of motion. He exhibits no edema and no tenderness.  Neurological: He is alert and oriented to person, place, and time. Coordination normal.  Skin: Skin is warm and dry. No rash noted. He is not diaphoretic. No erythema. No pallor.  Psychiatric: He has a normal mood and affect. His behavior is normal. Judgment and thought content normal.       EKG: Atrial fibrillation  -Intraventricular conduction defect -consider left ventricular hypertrophy.   -ST depression   +   Negative T-waves  -Nondiagnostic  -Possible  Anterior  ischemia.   ABNORMAL     ASSESSMENT AND PLAN

## 2014-08-24 NOTE — Assessment & Plan Note (Signed)
I stopped amlodipine due to increasing the dose of carvedilol.

## 2014-11-19 ENCOUNTER — Emergency Department: Payer: Self-pay | Admitting: Emergency Medicine

## 2014-11-19 LAB — PRO B NATRIURETIC PEPTIDE: B-Type Natriuretic Peptide: 3440 pg/mL — ABNORMAL HIGH (ref 0–125)

## 2014-11-19 LAB — CBC WITH DIFFERENTIAL/PLATELET
BASOS PCT: 0.6 %
Basophil #: 0.1 10*3/uL (ref 0.0–0.1)
Eosinophil #: 0.4 10*3/uL (ref 0.0–0.7)
Eosinophil %: 3.9 %
HCT: 36.1 % — AB (ref 40.0–52.0)
HGB: 11.4 g/dL — AB (ref 13.0–18.0)
LYMPHS ABS: 1 10*3/uL (ref 1.0–3.6)
LYMPHS PCT: 9 %
MCH: 27.6 pg (ref 26.0–34.0)
MCHC: 31.5 g/dL — AB (ref 32.0–36.0)
MCV: 88 fL (ref 80–100)
MONO ABS: 0.8 x10 3/mm (ref 0.2–1.0)
MONOS PCT: 7 %
NEUTROS ABS: 8.8 10*3/uL — AB (ref 1.4–6.5)
Neutrophil %: 79.5 %
Platelet: 153 10*3/uL (ref 150–440)
RBC: 4.13 10*6/uL — ABNORMAL LOW (ref 4.40–5.90)
RDW: 16.2 % — ABNORMAL HIGH (ref 11.5–14.5)
WBC: 11 10*3/uL — ABNORMAL HIGH (ref 3.8–10.6)

## 2014-11-19 LAB — URINALYSIS, COMPLETE
Bacteria: NEGATIVE
SPECIFIC GRAVITY: 1.026 (ref 1.003–1.030)

## 2014-11-19 LAB — TROPONIN I: Troponin-I: 0.03 ng/mL

## 2014-11-19 LAB — COMPREHENSIVE METABOLIC PANEL
ALK PHOS: 86 U/L
ALT: 136 U/L — AB
Albumin: 3.4 g/dL (ref 3.4–5.0)
Anion Gap: 7 (ref 7–16)
BUN: 24 mg/dL — ABNORMAL HIGH (ref 7–18)
Bilirubin,Total: 0.7 mg/dL (ref 0.2–1.0)
CALCIUM: 8.6 mg/dL (ref 8.5–10.1)
Chloride: 102 mmol/L (ref 98–107)
Co2: 27 mmol/L (ref 21–32)
Creatinine: 1.66 mg/dL — ABNORMAL HIGH (ref 0.60–1.30)
EGFR (African American): 55 — ABNORMAL LOW
EGFR (Non-African Amer.): 45 — ABNORMAL LOW
Glucose: 239 mg/dL — ABNORMAL HIGH (ref 65–99)
OSMOLALITY: 284 (ref 275–301)
POTASSIUM: 4.5 mmol/L (ref 3.5–5.1)
SGOT(AST): 104 U/L — ABNORMAL HIGH (ref 15–37)
Sodium: 136 mmol/L (ref 136–145)
Total Protein: 7.8 g/dL (ref 6.4–8.2)

## 2014-12-25 ENCOUNTER — Emergency Department: Payer: Self-pay | Admitting: Emergency Medicine

## 2014-12-30 ENCOUNTER — Emergency Department: Payer: Self-pay | Admitting: Emergency Medicine

## 2015-01-04 ENCOUNTER — Ambulatory Visit (INDEPENDENT_AMBULATORY_CARE_PROVIDER_SITE_OTHER): Payer: Medicare Other | Admitting: Cardiovascular Disease

## 2015-01-04 ENCOUNTER — Encounter: Payer: Self-pay | Admitting: Cardiovascular Disease

## 2015-01-04 VITALS — BP 140/89 | HR 81 | Ht 67.0 in | Wt 200.0 lb

## 2015-01-04 DIAGNOSIS — I5022 Chronic systolic (congestive) heart failure: Secondary | ICD-10-CM | POA: Diagnosis not present

## 2015-01-04 DIAGNOSIS — I10 Essential (primary) hypertension: Secondary | ICD-10-CM | POA: Diagnosis not present

## 2015-01-04 DIAGNOSIS — I25718 Atherosclerosis of autologous vein coronary artery bypass graft(s) with other forms of angina pectoris: Secondary | ICD-10-CM

## 2015-01-04 DIAGNOSIS — I4891 Unspecified atrial fibrillation: Secondary | ICD-10-CM | POA: Diagnosis not present

## 2015-01-04 MED ORDER — NITROGLYCERIN 0.4 MG SL SUBL: 0.4000 mg | SUBLINGUAL_TABLET | SUBLINGUAL | Status: AC | PRN

## 2015-01-04 NOTE — Assessment & Plan Note (Signed)
Blood pressure is reasonably controlled on current medications. 

## 2015-01-04 NOTE — Progress Notes (Signed)
HPI  This is a 59 year old man who is here today for a followup visit regarding coronary artery disease, atrial fibrillation and chronic systolic heart failure due to ischemic cardiomyopathy. He has remote history of CABG with multiple subsequent cardiac catheterizations and PCI. He presented in June of 2014 with non-ST elevation myocardial infarction and was transferred to [facility]. He underwent cardiac catheterization by Dr. Fransico Estefan which showed significant left main and three-vessel coronary artery disease (nondominant RCA) with patent LIMA to LAD and occluded SVG to OM (new) and SVG to PDA(old). Attempted PCI was done on the left main and left circumflex. The procedure was very difficult due to heavy calcifications. It was unsuccessful due to inability to deliver a stent to the left circumflex. A small 2.5 mm drug-eluting stent was placed in the distal left main extending into the ostial left circumflex.  He presented again in July, 2014 to [facility] with a small non-ST elevation myocardial infarction. I repeated cardiac catheterization which showed subtotally occluded  left circumflex at the site of  PCI . Ejection fraction was 35%. LIMA was patent and provided reasonable collaterals to the RCA. I discussed the case  during cath Conference. Medical therapy was recommended. He has known history of diabetes and peripheral vascular disease with right below the knee amputation. He also has history of paroxysmal atrial flutter documented during his previous admissions at [facility]. He has been taking his medications regularly.   He had worsening of dyspnea last year and was noted to be in atrial fibrillation . I increased Coreg and started anticoagulation with Eliquis.  He had recent problems with hypoglycemia. Glimepiride was discontinued. He also had some issues with fluid overload and was started on spironolactone. He is feeling better overall but still with some fluid overload.     No Known  Allergies   Current Outpatient Prescriptions on File Prior to Visit  Medication Sig Dispense Refill  . apixaban (ELIQUIS) 5 MG TABS tablet Take 1 tablet (5 mg total) by mouth 2 (two) times daily. 60 tablet 6  . aspirin EC 81 MG tablet Take 1 tablet (81 mg total) by mouth daily. 90 tablet 3  . carvedilol (COREG) 12.5 MG tablet Take 1 tablet (12.5 mg total) by mouth 2 (two) times daily. 60 tablet 6  . cetirizine (ZYRTEC) 10 MG tablet Take 10 mg by mouth daily.    . insulin detemir (LEVEMIR) 100 UNIT/ML injection Inject 20 Units into the skin daily.     Marland Kitchen losartan (COZAAR) 100 MG tablet TAKE ONE TABLET BY MOUTH ONCE DAILY 30 tablet 3  . simvastatin (ZOCOR) 10 MG tablet Take 10 mg by mouth at bedtime.    . traMADol (ULTRAM) 50 MG tablet Take 50 mg by mouth every 6 (six) hours as needed.      No current facility-administered medications on file prior to visit.     Past Medical History  Diagnosis Date  . HTN (hypertension)   . 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 Anxiety   . Headache(784.0)   . NSTEMI (non-ST elevated myocardial infarction) 10/17/2011  . Diabetes mellitus   . Pulmonary emphysema   . Obesity   . Paroxysmal atrial flutter 10/15/2011  . CAD (coronary artery disease), autologous vein bypass graft     s/p CABG in 80's or 90's, multiple caths/PCI. Presented in June of 2014 with non-ST elevation myocardial infarction. Cardiac catheterization done  by Dr. Fransico Jaqualin at [facility] showed severe three-vessel coronary artery disease (nondominant RCA )with patent LIMA to LAD, occluded SVG to OM1(new) and occluded SVG to PDA. There was significant ostial and distal left main stenosis. Attempted left main/LCX PCI  . MI (myocardial infarction)   . Chronic systolic heart failure     Ejection fraction 35%  . Broken femur      Past Surgical History  Procedure Laterality Date  . Coronary artery bypass graft    . No  past surgeries    . Amputation  10/17/2011    Procedure: AMPUTATION RAY;  Surgeon: Cammy Copa;  Location: [facility] OR;  Service: Orthopedics;  Laterality: Right;  First and Second Ray Amputation  . Amputation  11/01/2011    Procedure: AMPUTATION BELOW KNEE;  Surgeon: Nadara Mustard, MD;  Location: [facility] OR;  Service: Orthopedics;  Laterality: Right;  Right Below Knee Amputation  . Rt bka    . Cardiac catheterization  09/2006    [facility];EF 60%  . Cardiac catheterization  05/30/13    [facility]: Occluded SVG to RCA and SVG to OM (both chronic) , patent LIMA to LAD which gives collaterals to LCx and RCA. Subtotal occlusion of the native left circumflex at the site of PCI done at [facility]. Ejection fraction 55%     Family History  Problem Relation Age of Onset  . Alzheimer's disease Father   . Melanoma Father   . Benign prostatic hyperplasia Father   . Coronary artery disease Father     s/p CABG  . Heart attack Brother 57    MI  . Coronary artery disease Brother     s/p CABG  . Lymphoma Sister     non Hodgkin's   . Aortic aneurysm Mother   . Anemia Mother      History   Social History  . Marital Status: Single    Spouse Name: N/A  . Number of Children: N/A  . Years of Education: N/A   Occupational History  . Not on file.   Social History Main Topics  . Smoking status: Former Smoker -- 0.50 packs/day for 35 years    Types: Cigarettes    Quit date: 07/02/2001  . Smokeless tobacco: Never Used  . Alcohol Use: No  . Drug Use: No  . Sexual Activity: Not Currently   Other Topics Concern  . Not on file   Social History Narrative   Difficult home situation because he cares for his dad with Alzheimer's, younger brother lives at home as well but doesn't get along with the dad at all and patient is in between the two, creating significant stress. Pt is concerned about who will care for the dad while he is in the hospital, is requesting help. He is single and has no children and only source of  help is his niece.       PHYSICAL EXAM   BP 140/89 mmHg  Pulse 81  Ht  (1.702 m)  Wt 200 lb (90.719 kg)  BMI 31.32 kg/m2 Constitutional: He is oriented to person, place, and time. He appears well-developed and well-nourished. No distress.  HENT: No nasal discharge.  Head: Normocephalic and atraumatic.  Eyes: Pupils are equal and round. Right eye exhibits no discharge. Left eye exhibits no discharge.  Neck: Normal range of motion. Neck supple. No JVD present. No thyromegaly present.  Cardiovascular: Normal rate, irregular rhythm, normal heart sounds and. Exam reveals no gallop and no friction rub. No murmur heard.  Pulmonary/Chest: Effort normal and breath sounds normal. No stridor. No respiratory distress. He has no wheezes. He has no rales. He exhibits no tenderness.  Abdominal: Soft. Bowel sounds are normal. He exhibits no distension. There is no tenderness. There is no rebound and no guarding.  Musculoskeletal: Normal range of motion. He exhibits no edema and no tenderness.  Neurological: He is alert and oriented to person, place, and time. Coordination normal.  Skin: Skin is warm and dry. No rash noted. He is not diaphoretic. No erythema. No pallor.  Psychiatric: He has a normal mood and affect. His behavior is normal. Judgment and thought content normal.       EKG: atrial fibrillation  - occasional ectopic ventricular beat    -Nonspecific QRS widening.   -ST depression   +   T-abnormality  -Nondiagnostic  -Possible  Anterior/lateral  ischemia.   ABNORMAL       ASSESSMENT AND PLAN

## 2015-01-04 NOTE — Assessment & Plan Note (Signed)
He is doing reasonably well with no frequent episodes of chest pain. I refilled his nitroglycerin.

## 2015-01-04 NOTE — Patient Instructions (Signed)
Continue same medications.  Follow up in 3 months.  

## 2015-01-04 NOTE — Assessment & Plan Note (Signed)
He is on optimal medical therapy. He was recently started on spironolactone followed by his primary care physician. I might consider adding a small dose furosemide if he continues to be fluid overloaded.

## 2015-01-04 NOTE — Assessment & Plan Note (Signed)
Ventricular rate is more well controlled after increasing the dose of carvedilol. Continue anticoagulation with Eliquis.

## 2015-02-14 ENCOUNTER — Inpatient Hospital Stay
Admit: 2015-02-14 | Disposition: A | Discharge status: Other Acute Inpatient Hospital | Payer: Self-pay | Attending: Internal Medicine | Admitting: Internal Medicine

## 2015-02-14 LAB — COMPREHENSIVE METABOLIC PANEL
ALK PHOS: 101 U/L
ALT: 34 U/L
Albumin: 2.9 g/dL — ABNORMAL LOW
Anion Gap: 13 (ref 7–16)
BUN: 51 mg/dL — ABNORMAL HIGH
Bilirubin,Total: 1.2 mg/dL
CALCIUM: 8.5 mg/dL — AB
CO2: 25 mmol/L
Chloride: 104 mmol/L
Creatinine: 1.53 mg/dL — ABNORMAL HIGH
EGFR (African American): 56 — ABNORMAL LOW
EGFR (Non-African Amer.): 49 — ABNORMAL LOW
GLUCOSE: 152 mg/dL — AB
Potassium: 4.3 mmol/L
SGOT(AST): 95 U/L — ABNORMAL HIGH
Sodium: 142 mmol/L
Total Protein: 7.6 g/dL

## 2015-02-14 LAB — CBC
HCT: 36.6 % — ABNORMAL LOW (ref 40.0–52.0)
HGB: 11.3 g/dL — ABNORMAL LOW (ref 13.0–18.0)
MCH: 23.8 pg — ABNORMAL LOW (ref 26.0–34.0)
MCHC: 30.8 g/dL — ABNORMAL LOW (ref 32.0–36.0)
MCV: 77 fL — ABNORMAL LOW (ref 80–100)
PLATELETS: 214 10*3/uL (ref 150–440)
RBC: 4.73 10*6/uL (ref 4.40–5.90)
RDW: 19.2 % — AB (ref 11.5–14.5)
WBC: 20.7 10*3/uL — ABNORMAL HIGH (ref 3.8–10.6)

## 2015-02-14 LAB — CK TOTAL AND CKMB (NOT AT ARMC)
CK, TOTAL: 2324 U/L — AB
CK-MB: 45 ng/mL — ABNORMAL HIGH

## 2015-02-14 LAB — CLOSTRIDIUM DIFFICILE(ARMC)

## 2015-02-14 LAB — URINALYSIS, COMPLETE
BACTERIA: NONE SEEN
BILIRUBIN, UR: NEGATIVE
BLOOD: NEGATIVE
GLUCOSE, UR: NEGATIVE mg/dL (ref 0–75)
Hyaline Cast: 5
KETONE: NEGATIVE
LEUKOCYTE ESTERASE: NEGATIVE
Nitrite: NEGATIVE
PH: 5 (ref 4.5–8.0)
Protein: 30
RBC,UR: 6 /HPF (ref 0–5)
SPECIFIC GRAVITY: 1.02 (ref 1.003–1.030)
Squamous Epithelial: 1
WBC UR: <1 /HPF (ref 0–5)

## 2015-02-14 LAB — TROPONIN I
TROPONIN-I: 1.25 ng/mL — AB
Troponin-I: 1.31 ng/mL — ABNORMAL HIGH
Troponin-I: 1.39 ng/mL — ABNORMAL HIGH

## 2015-02-14 LAB — LACTIC ACID, PLASMA: Lactic Acid, Venous: 2.9 mmol/L

## 2015-02-14 LAB — CK-MB
CK-MB: 36.9 ng/mL — ABNORMAL HIGH
CK-MB: 29.7 ng/mL — AB

## 2015-02-15 DIAGNOSIS — R4182 Altered mental status, unspecified: Secondary | ICD-10-CM

## 2015-02-15 DIAGNOSIS — R7989 Other specified abnormal findings of blood chemistry: Secondary | ICD-10-CM

## 2015-02-15 DIAGNOSIS — I4891 Unspecified atrial fibrillation: Secondary | ICD-10-CM | POA: Diagnosis not present

## 2015-02-15 LAB — CBC WITH DIFFERENTIAL/PLATELET
Basophil #: 0 10*3/uL (ref 0.0–0.1)
Basophil %: 0.1 %
EOS PCT: 0 %
Eosinophil #: 0 10*3/uL (ref 0.0–0.7)
HCT: 34.7 % — ABNORMAL LOW (ref 40.0–52.0)
HGB: 10.5 g/dL — ABNORMAL LOW (ref 13.0–18.0)
Lymphocyte #: 0.9 10*3/uL — ABNORMAL LOW (ref 1.0–3.6)
Lymphocyte %: 5.9 %
MCH: 23.9 pg — ABNORMAL LOW (ref 26.0–34.0)
MCHC: 30.4 g/dL — ABNORMAL LOW (ref 32.0–36.0)
MCV: 79 fL — AB (ref 80–100)
MONOS PCT: 6.2 %
Monocyte #: 1 x10 3/mm (ref 0.2–1.0)
NEUTROS PCT: 87.8 %
Neutrophil #: 13.4 10*3/uL — ABNORMAL HIGH (ref 1.4–6.5)
PLATELETS: 159 10*3/uL (ref 150–440)
RBC: 4.4 10*6/uL (ref 4.40–5.90)
RDW: 18.8 % — ABNORMAL HIGH (ref 11.5–14.5)
WBC: 15.3 10*3/uL — AB (ref 3.8–10.6)

## 2015-02-15 LAB — COMPREHENSIVE METABOLIC PANEL
ALBUMIN: 2.5 g/dL — AB
ANION GAP: 11 (ref 7–16)
Alkaline Phosphatase: 82 U/L
BILIRUBIN TOTAL: 0.9 mg/dL
BUN: 49 mg/dL — AB
Calcium, Total: 7.7 mg/dL — ABNORMAL LOW
Chloride: 108 mmol/L
Co2: 22 mmol/L
Creatinine: 1.41 mg/dL — ABNORMAL HIGH
EGFR (Non-African Amer.): 54 — ABNORMAL LOW
Glucose: 181 mg/dL — ABNORMAL HIGH
Potassium: 4.3 mmol/L
SGOT(AST): 78 U/L — ABNORMAL HIGH
SGPT (ALT): 29 U/L
Sodium: 141 mmol/L
TOTAL PROTEIN: 6.3 g/dL — AB

## 2015-02-15 LAB — CK: CK, Total: 1063 U/L — ABNORMAL HIGH

## 2015-02-16 DIAGNOSIS — I214 Non-ST elevation (NSTEMI) myocardial infarction: Secondary | ICD-10-CM | POA: Diagnosis not present

## 2015-02-16 LAB — COMPREHENSIVE METABOLIC PANEL
ALBUMIN: 2.6 g/dL — AB
AST: 55 U/L — AB
Alkaline Phosphatase: 84 U/L
Anion Gap: 5 — ABNORMAL LOW (ref 7–16)
BILIRUBIN TOTAL: 0.3 mg/dL
BUN: 41 mg/dL — AB
CREATININE: 1.33 mg/dL — AB
Calcium, Total: 7.2 mg/dL — ABNORMAL LOW
Chloride: 104 mmol/L
Co2: 24 mmol/L
EGFR (Non-African Amer.): 58 — ABNORMAL LOW
GLUCOSE: 180 mg/dL — AB
Potassium: 3.8 mmol/L
SGPT (ALT): 22 U/L
Sodium: 135 mmol/L
TOTAL PROTEIN: 6.5 g/dL

## 2015-02-16 LAB — TSH: Thyroid Stimulating Horm: 3.314 u[IU]/mL

## 2015-02-16 LAB — CK: CK, Total: 465 U/L — ABNORMAL HIGH

## 2015-02-17 DIAGNOSIS — I214 Non-ST elevation (NSTEMI) myocardial infarction: Secondary | ICD-10-CM | POA: Diagnosis not present

## 2015-02-17 LAB — CBC WITH DIFFERENTIAL/PLATELET
BASOS PCT: 0.5 %
Basophil #: 0 10*3/uL (ref 0.0–0.1)
EOS PCT: 2.4 %
Eosinophil #: 0.2 10*3/uL (ref 0.0–0.7)
HCT: 34.3 % — ABNORMAL LOW (ref 40.0–52.0)
HGB: 10.6 g/dL — ABNORMAL LOW (ref 13.0–18.0)
Lymphocyte #: 1 10*3/uL (ref 1.0–3.6)
Lymphocyte %: 11.3 %
MCH: 24 pg — AB (ref 26.0–34.0)
MCHC: 30.7 g/dL — ABNORMAL LOW (ref 32.0–36.0)
MCV: 78 fL — AB (ref 80–100)
Monocyte #: 0.6 x10 3/mm (ref 0.2–1.0)
Monocyte %: 7 %
NEUTROS PCT: 78.8 %
Neutrophil #: 7.3 10*3/uL — ABNORMAL HIGH (ref 1.4–6.5)
Platelet: 146 10*3/uL — ABNORMAL LOW (ref 150–440)
RBC: 4.4 10*6/uL (ref 4.40–5.90)
RDW: 19.1 % — ABNORMAL HIGH (ref 11.5–14.5)
WBC: 9.2 10*3/uL (ref 3.8–10.6)

## 2015-02-17 LAB — BASIC METABOLIC PANEL
Anion Gap: 6 — ABNORMAL LOW (ref 7–16)
BUN: 29 mg/dL — ABNORMAL HIGH
CALCIUM: 7.3 mg/dL — AB
CHLORIDE: 104 mmol/L
CO2: 23 mmol/L
Creatinine: 1.1 mg/dL
EGFR (African American): >60
Glucose: 291 mg/dL — ABNORMAL HIGH
Potassium: 3.8 mmol/L
Sodium: 133 mmol/L — ABNORMAL LOW

## 2015-02-17 LAB — CLOSTRIDIUM DIFFICILE(ARMC)

## 2015-02-17 LAB — CK: CK, TOTAL: 418 U/L — AB

## 2015-02-18 DIAGNOSIS — I214 Non-ST elevation (NSTEMI) myocardial infarction: Secondary | ICD-10-CM | POA: Diagnosis not present

## 2015-02-19 LAB — BASIC METABOLIC PANEL
Anion Gap: 5 — ABNORMAL LOW (ref 7–16)
BUN: 30 mg/dL — AB
CHLORIDE: 103 mmol/L
CREATININE: 1.08 mg/dL
Calcium, Total: 7.7 mg/dL — ABNORMAL LOW
Co2: 26 mmol/L
EGFR (African American): >60
GLUCOSE: 301 mg/dL — AB
Potassium: 4 mmol/L
SODIUM: 134 mmol/L — AB

## 2015-02-19 LAB — CBC WITH DIFFERENTIAL/PLATELET
BASOS ABS: 0.1 10*3/uL (ref 0.0–0.1)
Basophil %: 0.7 %
Eosinophil #: 0.4 10*3/uL (ref 0.0–0.7)
Eosinophil %: 3.7 %
HCT: 35.7 % — AB (ref 40.0–52.0)
HGB: 10.9 g/dL — ABNORMAL LOW (ref 13.0–18.0)
LYMPHS ABS: 1 10*3/uL (ref 1.0–3.6)
LYMPHS PCT: 9.3 %
MCH: 23.8 pg — ABNORMAL LOW (ref 26.0–34.0)
MCHC: 30.6 g/dL — ABNORMAL LOW (ref 32.0–36.0)
MCV: 78 fL — ABNORMAL LOW (ref 80–100)
MONOS PCT: 8.1 %
Monocyte #: 0.8 x10 3/mm (ref 0.2–1.0)
NEUTROS PCT: 78.2 %
Neutrophil #: 8.2 10*3/uL — ABNORMAL HIGH (ref 1.4–6.5)
PLATELETS: 155 10*3/uL (ref 150–440)
RBC: 4.6 10*6/uL (ref 4.40–5.90)
RDW: 19.1 % — AB (ref 11.5–14.5)
WBC: 10.5 10*3/uL (ref 3.8–10.6)

## 2015-02-19 LAB — CULTURE, BLOOD (SINGLE)

## 2015-02-22 ENCOUNTER — Encounter: Payer: Self-pay | Admitting: Physician Assistant

## 2015-03-05 NOTE — H&P (Signed)
PATIENT NAME:  [name], [name] MR#:  [id number] DATE OF BIRTH:  May 28, 1956  DATE OF ADMISSION:  05/29/2013  REFERRING PHYSICIAN:  Dr. Chiquita LothJade Sung.  PRIMARY CARE PHYSICIAN:  Dr. Temple PaciniL. K. Bliss.  PRIMARY CARDIOLOGY:  Started to follow up with [facility] Dr. Kirke CorinArida last week.  CHIEF COMPLAINT:  Chest pain.   HISTORY OF PRESENT ILLNESS:  This is a 59 year old male with significant past medical history of coronary artery disease status post CABG and recent stent [facility] June of this year, presents with chest pain, reports chest pain started this evening at rest, with significant relief after receiving nitro spray and morphine, described it as a pressure, tightness quality, with some mild radiation to the left shoulder, as well, reports some mild nausea, sweating and shortness of breath and weakness, but denies any vomiting or palpitations, the patient's first troponin came back positive at 0.16, as well his EKG showing ST depressions in the anterior septal leads, the patient received 324 of aspirin in the ED, received nitro spray by EMS, is still complaining of mild chest pain, so he was given morphine with significant relief of his chest pain, the patient was started on IV heparin in the ED as well, the patient reports he was [facility] where he had one stent put, he reports he had another vessel disease where they were not able to insert a stent, his medical record was requested from [facility], hospitalist service were requested to admit the patient for further treatment of his non-ST-elevated MI.   PAST MEDICAL HISTORY: 1.  Coronary artery disease status post CABG and stent.  2.  Diabetes mellitus.  3.  Hypertension.  4.  Morbid obesity.   FAMILY HISTORY:  Significant for diabetes mellitus and COPD in his father.   ALLERGIES:  No known drug allergies.   HOME MEDICATIONS: 1.  Aspirin 325 mg oral daily.  2.  Lisinopril 10 mg oral daily.  3.  Simvastatin 10 mg oral at bedtime.  4.   Lantus 16 units subQ at bedtime.  5.  Reports he is on oral hypoglycemic agents 1 pill daily.  He does not recall the name.  6.  Plavix 75 mg oral daily.  7.  Metoprolol 25 mg oral twice daily.   PAST SURGICAL HISTORY: 1.  Right below knee amputation secondary to diabetic foot.  2.  History of CABG in the 1990s.   REVIEW OF SYSTEMS:  CONSTITUTIONAL:  Denies fever or chills.  Complains of generalized weakness and fatigue.  EYES:  Denies blurry vision, double vision, inflammation, glaucoma.  EARS, NOSE, THROAT:  Denies tinnitus, ear pain, epistaxis or discharge.  RESPIRATORY:  Denies cough, wheezing, hemoptysis.  Complains of dyspnea.  CARDIOVASCULAR:  Complains of chest pain.  Denies any edema, arrhythmia, palpitations, syncope.  GASTROINTESTINAL:  Denies vomiting, diarrhea, abdominal pain, hematemesis, melena, coffee-ground emesis or jaundice.  Had complaints of nausea.  GENITOURINARY:  Denies dysuria, hematuria, renal colic.  ENDOCRINE:  Denies polyuria, polydipsia, heat or cold intolerance.  HEMATOLOGY:  Denies anemia, easy bruising, bleeding diathesis.  INTEGUMENT:  Denies acne, rash or skin lesions.  MUSCULOSKELETAL:  Denies any arthritis, cramps, swelling or gout.  NEUROLOGICAL:  Denies, vertigo, ataxia, dementia, headache, CVA TIA. PSYCHIATRIC:  Denies anxiety, insomnia, bipolar disorder or schizophrenia.   PHYSICAL EXAMINATION: VITAL SIGNS:  Temperature 98.6, pulse 67, respiratory rate 18, blood pressure 170/62, saturating 98% on room air.  GENERAL:  Morbidly obese man, looks comfortable in bed in no apparent  distress.  HEENT:  Head is atraumatic, normocephalic.  Pupils equal, reactive to light.  Pink conjunctivae.  Anicteric sclerae.  Moist oral mucosa.  NECK:  Supple.  No thyromegaly.  No JVD.  No carotid bruits.  CHEST:  Good air entry bilaterally.  No wheezing, rales, rhonchi.  Has reproducible chest pain on palpation.  CARDIOVASCULAR:  S1, S2 heard.  No rubs, murmurs,  gallops.  ABDOMEN:  Obese, soft, nontender, nondistended.  Bowel sounds present.  EXTREMITIES:  Has right below knee amputation.  Left lower extremity has mild pitting edema.  Dorsalis pedis pulse was felt.  SKIN:  Normal skin turgor.  Warm and dry.  PSYCHIATRIC:  Appropriate affect.  Awake, alert x 3.  Intact judgment and insight. NEUROLOGIC:  Cranial nerves grossly intact.  Motor intact.  No focal deficits.  LYMPHATICS:  No cervical or supraclavicular lymphadenopathy.   PERTINENT LABORATORY DATA:  Glucose 389, BUN 17, creatinine 1.2, sodium 138, potassium 4, chloride 107, CO2 21.  Troponin was 0.16.  White blood cells 10.2, hemoglobin 11.8, platelets 109, INR 1.  EKG showing normal sinus rhythm with occasional PVCs, heart rate of 80 beats per minute with ST depression in anterior lateral leads.   ASSESSMENT AND PLAN: 1.  Non-ST-elevated myocardial infarction, the patient has known history of coronary artery disease, presents with chest pain and shortness of breath, has positive troponins, and ST depression in anterior lateral leads, the patient given 324 mg of aspirin, and will be started on heparin drip, his chest pain much improved, but still present to a much less degree so we will start him on nitro paste, we will admit him to telemetry unit, continue to cycle his cardiac enzymes and consult cardiology.  We will give him one dose of metoprolol now.  We will continue him on his lisinopril, statin as well.  2.  Coronary artery disease.  The patient is on aspirin, statin, Plavix, beta-blockers and lisinopril.  3.  Diabetes mellitus, uncontrolled.  We will resume the patient's Lantus and we will add insulin sliding scale.  4.  Hypertension.  We will continue his home medication, mildly elevated, we will give him one dose of beta-blockers now, as well we will give him nitro paste.  5.  DVT prophylaxis.  The patient is on full dose anticoagulation secondary to his non-ST-elevation  myocardial  infarction.  6.  CODE STATUS:  The patient reports he is FULL CODE, but if he becomes in a vegetative state he does not want to be on life support.   Total time spent on admission and patient care 55 minutes.    ____________________________ Starleen Arms, MD dse:ea [name]: 05/29/2013 02:15:06 ET T: 05/29/2013 02:40:25 ET JOB#: [id number]  cc: Starleen Arms, MD, <Dictator> Solan Vosler Teena Irani MD ELECTRONICALLY SIGNED 05/29/2013 4:58

## 2015-03-05 NOTE — Discharge Summary (Signed)
PATIENT NAME:  [name], [name] MR#:  [id number] DATE OF BIRTH:  1956-06-28  DATE OF ADMISSION:  05/29/2013 DATE OF DISCHARGE:  05/31/2013  ADMITTING PHYSICIAN: Starleen Armsawood S. Elgergawy, MD  DISCHARGING PHYSICIAN: Enid Baasadhika Sammuel Blick, MD  PRIMARY CARE PHYSICIAN: Burley SaverL. Katherine Bliss, MD  CONSULTATIONS IN THE HOSPITAL: Cardiology consultation with Dr. Kirke CorinArida.   DISCHARGE DIAGNOSES: 1.  Non-ST segment elevation myocardial infarction.  2.  Coronary artery disease, status post bypass graft surgery and significant 3-vessel disease noted on cardiac catheterization. [facility].  3.  Chronic systolic congestive heart failure, ejection fraction of 35%.  4.  Hypertension.  5.  Diabetes mellitus.  6.  Hyperlipidemia.  7.  History of osteomyelitis and status post right leg below-knee amputation.   DISCHARGE HOME MEDICATIONS: 1.  Aspirin 325 mg p.o. daily.  2.  Lantus 16 units subcutaneously at bedtime once a day.  3.  Spironolactone 25 mg p.o. daily.  4.  Lisinopril 10 mg p.o. daily.  5.  Coreg 3.125 mg p.o. b.i.[name].  6.  Simvastatin 10 mg p.o. daily.  7.  Plavix 75 mg p.o. daily.   DISCHARGE DIET: Low-sodium, ADA 1800 diet.   DISCHARGE ACTIVITY: As tolerated.    HOME OXYGEN: None.  FOLLOWUP INSTRUCTIONS: Follow up with cardiology in 2 weeks.   LABORATORY AND IMAGING STUDIES: Sodium 139, potassium 4.0, chloride 106, bicarbonate 28, BUN 18, creatinine 1.2, glucose 227 and calcium of 8.4.   Cardiac catheterization revealing significant 3-vessel coronary artery disease with known occluded saphenous venous graft to obtuse marginal and saphenous venous graft to PDA. Patent LIMA to LAD is giving collaterals to the whole left circumflex and RCA distribution. No good revascularization options. No targets for redo CABG.  LDL cholesterol 56, HDL 30, total cholesterol 045112, triglycerides 131.   WBC 8.6, hemoglobin 11.5, hematocrit 33.5, platelet count of 94.   BRIEF HOSPITAL  COURSE: Mr. [name] is a 59 year old, obese Caucasian male with past medical history significant for coronary artery disease, status post bypass graft surgery with recent non-STEMI and admitted and [facility] and [facility] stent [facility] in, and they found severe 3-vessel disease and he was discharged home on medications, however, presented with chest pain and found to have elevated troponins.  1.  Non-ST segment elevation MI: Was placed again on heparin drip and admitted to CCU. Seen by cardiology. Did have cardiac catheterization again, which showed patent LIMA to LAD which was supplying collaterals to all over the heart in the left circumflex and RCA territory. The patient does not have good revascularization options, no options for redo CABG. At this time, medical management is only recommended, and his metoprolol was changed over to Coreg  and Aldactone was started because his EF has been decreased to 30%. He is already on aspirin, Plavix and lisinopril and statin at this point.  2.  Hypertension: The patient is on Aldactone, lisinopril and Coreg at this time.  3.  Diabetes mellitus: Sugars stable on Lantus at this time.   His course has been otherwise uneventful in the hospital.   DISCHARGE CONDITION: Stable.   DISCHARGE DISPOSITION: Home.   TIME SPENT ON DISCHARGE: 40 minutes.    ____________________________ Enid Baasadhika Riel Hirschman, MD [id number] [name]: 06/02/2013 15:22:13 ET T: 06/02/2013 21:05:24 ET JOB#: [id number]  cc: Enid Baasadhika Peggy Monk, MD, <Dictator> Muhammad A. Kirke CorinArida, MD Enid BaasADHIKA Fabion Gatson MD ELECTRONICALLY SIGNED 06/16/2013 15:12

## 2015-03-05 NOTE — Consult Note (Signed)
General Aspect This is a 59 year old man who recently established cardiac care with me.  He has extensive cardiovascular history.  He has remote history of CABG with multiple subsequent cardiac catheterizations and PCI. He presented in June of this year with non-ST elevation myocardial infarction and was transferred to [facility]. He underwent cardiac catheterization by Dr. Tobe Sos which showed significant left main and three-vessel coronary artery disease (nondominant RCA) with patent LIMA to LAD and occluded SVG to OM (new) and SVG to PDA(old?). Attempted PCI was done on the left main and left circumflex. The procedure was very difficult due to heavy calcifications. It was unsuccessful due to inability to deliver a stent to the left circumflex. A small 2.5 mm drug-eluting stent was placed in the distal left main extending into the ostial left circumflex. The procedure was very prolonged and there was concern about possible radiation injury. However, the patient did not develop any symptoms.   He has known history of diabetes and peripheral vascular disease with below the knee amputation. He also has history of paroxysmal atrial flutter documented during his previous admissions at [facility]. He has been taking his medications regularly.  He presented with a prolonged episode of chest tightness which lasted for about 1 hour. It was associated with dyspnea. He has been having dyspnea with minimal acitivities.  He was tachycardiac on presentation with anterolateral ST depression. Cardiac enzymes were milely elevated. He is chest pain free now.   Physical Exam:  GEN no acute distress   NECK supple   RESP normal resp effort  no use of accessory muscles   CARD Regular rate and rhythm  No murmur   ABD denies tenderness   EXTR negative cyanosis/clubbing, negative edema   SKIN normal to palpation   NEURO cranial nerves intact   PSYCH alert, A+O to time, place, person   Review of Systems:   Subjective/Chief Complaint chest pain and dyspnea.   General: Fatigue   Skin: No Complaints   ENT: No Complaints   Eyes: No Complaints   Neck: No Complaints   Respiratory: Short of breath   Cardiovascular: Tightness  Dyspnea   Gastrointestinal: No Complaints   Genitourinary: No Complaints   Vascular: No Complaints   Musculoskeletal: No Complaints   Neurologic: No Complaints   Hematologic: No Complaints   Endocrine: No Complaints   Psychiatric: No Complaints     CHF:    Emphysema:    Diabetes Mellitus, Type II (NIDD):    Hypertension:    right BKA:    Stent - Cardiac:    CABG (Coronary Artery Bypass Graft):    Angioplasty:   Lab Results: Routine Chem:  17-Jul-14 00:30   Glucose, Serum  389  BUN 17  Creatinine (comp) 1.20  Sodium, Serum 138  Potassium, Serum 4.0  Chloride, Serum 107  CO2, Serum 21  Calcium (Total), Serum 8.5  Anion Gap 10  Osmolality (calc) 293  eGFR (African American) >60  eGFR (Non-African American) >60 (eGFR values <45m/min/1.73 m2 may be an indication of chronic kidney disease (CKD). Calculated eGFR is useful in patients with stable renal function. The eGFR calculation will not be reliable in acutely ill patients when serum creatinine is changing rapidly. It is not useful in  patients on dialysis. The eGFR calculation may not be applicable to patients at the low and high extremes of body sizes, pregnant women, and vegetarians.)  Result Comment TROPONIN - RESULTS VERIFIED BY REPEAT TESTING.  - CALLED TO LYN STRICKLAND:05/29/13 AT  -  0121.Marland KitchenMarland KitchenTPL  - READ-BACK PROCESS PERFORMED.  Result(s) reported on 29 May 2013 at 01:24AM.  Cardiac:  17-Jul-14 00:30   CK, Total 123  CPK-MB, Serum  5.3 (Result(s) reported on 29 May 2013 at 01:10AM.)  Troponin I  0.16 (0.00-0.05 0.05 ng/mL or less: NEGATIVE  Repeat testing in 3-6 hrs  if clinically indicated. >0.05 ng/mL: POTENTIAL  MYOCARDIAL INJURY. Repeat  testing in 3-6 hrs if   clinically indicated. NOTE: An increase or decrease  of 30% or more on serial  testing suggests a  clinically important change)  Routine Coag:  17-Jul-14 00:30   Prothrombin 13.5  INR 1.0 (INR reference interval applies to patients on anticoagulant therapy. A single INR therapeutic range for coumarins is not optimal for all indications; however, the suggested range for most indications is 2.0 - 3.0. Exceptions to the INR Reference Range may include: Prosthetic heart valves, acute myocardial infarction, prevention of myocardial infarction, and combinations of aspirin and anticoagulant. The need for a higher or lower target INR must be assessed individually. Reference: The Pharmacology and Management of the Vitamin K  antagonists: the seventh ACCP Conference on Antithrombotic and Thrombolytic Therapy. PQAES.9753 Sept:126 (3suppl): [id number]. A HCT value >55% may artifactually increase the PT.  In one study,  the increase was an average of 25%. Reference:  "Effect on Routine and Special Coagulation Testing Values of Citrate Anticoagulant Adjustment in Patients with High HCT Values." American Journal of Clinical Pathology 2006;126:400-405.)  Routine Hem:  17-Jul-14 00:30   WBC (CBC) 10.2  RBC (CBC)  4.35  Hemoglobin (CBC)  11.8  Hematocrit (CBC)  36.3  Platelet Count (CBC)  109 (Result(s) reported on 29 May 2013 at 12:47AM.)  MCV 83  MCH 27.2  MCHC 32.6  RDW  15.3   EKG:  EKG NSR   Radiology Results: XRay:    17-Jul-14 01:07, Chest Portable Single View  Chest Portable Single View   REASON FOR EXAM:    Chest Pain  COMMENTS:       PROCEDURE: DXR - DXR PORTABLE CHEST SINGLE VIEW  - May 29 2013  1:07AM     RESULT: Comparison is made to the study of April 21, 2013.    The lungs are adequately inflated. The interstitial markings are   increased but have improved somewhat since the previous study. The   cardiac silhouette remains enlarged. The pulmonary vascularity remains    engorged. The patient has undergone previous CABG.    IMPRESSION:  The findings are consistent with low-gradeCHF. This may be   both acute and chronic. There is no alveolar pneumonia.   Dictation Site: 2        Verified By: DAVID A. Martinique, M.D., MD    No Known Allergies:   Vital Signs/Nurse's Notes: **Vital Signs.:   17-Jul-14 07:00  Vital Signs Type Routine  Temperature Temperature (F) 97.4  Celsius 36.3  Temperature Source oral  Pulse Pulse 50  Respirations Respirations 16  Systolic BP Systolic BP 005  Diastolic BP (mmHg) Diastolic BP (mmHg) 53  Mean BP 72  Pulse Ox % Pulse Ox % 100  Pulse Ox Activity Level  At rest  Oxygen Delivery 2L; Nasal Cannula  Pulse Ox Heart Rate 52    Impression 1. NSTEMI: with extensive cardiac history and recent unsuccessful LCX PCI at [facility] now with significant ECG changes and positive cardiac enzymes.  He is chest pain free now.  Continue Heparin and rest of his medications.  Plan cardiac cath tomorrow as he  had full breakfast.   2. CAD s/p CABG and multiple PCIs: Continue dual antiplatlet therapy.   3. HTN: BP is controlled.   Electronic Signatures: Kathlyn Sacramento (MD)  (Signed 17-Jul-14 09:45)  Authored: General Aspect/Present Illness, History and Physical Exam, Review of System, Past Medical History, Labs, EKG , Radiology, Allergies, Vital Signs/Nurse's Notes, Impression/Plan   Last Updated: 17-Jul-14 09:45 by Kathlyn Sacramento (MD)

## 2015-03-14 NOTE — Consult Note (Signed)
General Aspect Primary Cardiologist: Dr. Fletcher Anon, MD PCP: [facility] ________________  59 year old male with history of CAD s/p CABG in 80s/90s s/p stenting 04/2013, PAF on Eliquis, chronic systolic heart failure due to ischemic cardiomyopathy, HTN, IDDM, PVD s/p right BKA, pulmonary emphysema, and obesity who presented to [facility] on 4/4 after being found on the floor covered in feces for and unknown amount of time. Upon his arrival to [facility] he was noted to be in rhabdo (CK 2324) and be in a-fib with RVR with HR of 101 with troponin of 1.25-->1.31-->1.39. Cardiology was consulted for further evaluation of the a-fib and elevated troponin.  ________________  Past Medical History:?? Diagnosis?? Date?? ????? HTN (hypertension)?? ?? ????? Diabetic foot ulcers?? 10/2011?? ?? ?? bilateral plantar 1st MTP ulcers, with deep tissue infection right foot 10/2011 ?? ????? Anxiety?? ?? ????? Headache(784.0)?? ?? ????? NSTEMI (non-ST elevated myocardial infarction)?? 10/17/2011?? ????? Diabetes mellitus?? ?? ????? Pulmonary emphysema?? ?? ????? Obesity?? ?? ????? Paroxysmal atrial flutter/fib?? 10/15/2011?? ????? CAD (coronary artery disease), autologous vein bypass graft?? ?? ?? ?? s/p CABG in 80's or 90's, multiple caths/PCI. Presented in June of 2014 with non-ST elevation myocardial infarction. Cardiac catheterization done by Dr. Tobe Sos at [facility] showed severe three-vessel coronary artery disease (nondominant RCA )with patent LIMA to LAD, occluded SVG to OM1(new) and occluded SVG to PDA. There was significant ostial and distal left main stenosis. Attempted left main/LCX PCI?? ????? MI (myocardial infarction)?? ?? ????? Chronic systolic heart failure?? ?? ?? ?? Ejection fraction 35%?? ????? Broken femur?? ??   ????  Past Surgical History:?? Procedure?? Laterality?? Date?? ????? Coronary artery bypass graft?? ?? ?? ????? No past  surgeries?? ?? ?? ????? Amputation?? ?? 10/17/2011?? ?? ?? Procedure: AMPUTATION RAY;?? Surgeon: Meredith Pel;?? Location: [facility] OR;?? Service: Orthopedics;?? Laterality: Right;?? First and Second Ray Amputation?? ????? Amputation?? ?? 11/01/2011?? ?? ?? Procedure: AMPUTATION BELOW KNEE;?? Surgeon: Newt Minion, MD;?? Location: [facility] OR;?? Service: Orthopedics;?? Laterality: Right;?? Right Below Knee Amputation?? ????? Rt bka?? ?? ?? ????? Cardiac catheterization?? ?? 09/2006?? ?? ?? [facility];EF 60%?? ????? Cardiac catheterization?? ?? 05/30/13?? ?? ?? [facility]: Occluded SVG to RCA and SVG to OM (both chronic) , patent LIMA to LAD which gives collaterals to LCx and RCA. Subtotal occlusion of the native left circumflex at the site of PCI done at [facility]. Ejection fraction 55%?? ____________________   Present Illness 59 year old male with the above problem list who presented to [facility] today after being found down on the floor for an unknown amount of time. He has remote history of CABG with multiple subsequent cardiac catheterizations and PCI. He presented in June of 2014 with non-ST elevation myocardial infarction and was transferred to [facility]. He underwent cardiac catheterization by Dr. Tobe Sos which showed significant left main and three-vessel coronary artery disease (nondominant RCA) with patent LIMA to LAD and occluded SVG to OM (new) and SVG to PDA (old). Attempted PCI was done on the left main and left circumflex. The procedure was very difficult due to heavy calcifications. It was unsuccessful due to inability to deliver a stent to the left circumflex. A small 2.5 mm drug-eluting stent was placed in the distal left main extending into the ostial left circumflex.   He presented again in July 2014, to [facility] with a non-ST elevation myocardial infarction. Repeat cardiac catheterization at that time showed subtotally occluded?? left circumflex at the site of?? PCI. Ejection fraction was 35%. LIMA to LAD was patent  and provided reasonable collaterals to the  RCA. Case was discussed during cath conference and medical therapy was recommended.   At his last office follow up in 12/2014 he was doing well from an aginal state. He was not having any anginal symptoms at that time. He was continued on aspirin 81 mg, Coreg 12.g mg bid, Zocor 10 mg, and SL NTG. He was also on optimal medical therpay for his chronic systolic HF (ischemic in etiology). He was continued on losartan 100 mg and Aldactone 25 mg. He was noted noted to be in a-fib at that OV - rate controlled.   He has known history of diabetes and peripheral vascular disease with right below the knee amputation. He also has history of paroxysmal atrial flutter/fib documented during his previous admissions at [facility]. He has been taking his medications regularly.    He presented to [facility] on 4/4 after being found down on his floor for several days covered in his feces. The last thing he remembers is going to use the restroom. He reports his stomach has been upset lately, leading to a fair amount of diarrhea. He also has not been eating well lately, missing a few meals. He denies any chest pain, palpitations, increased dyspnea, weight gain, or cough. He is uncertain of fever or chills. He reports he was checked on by his neighbor 2/2 the basketball game Human resources officer).   Upon his arrival to [facility] he was found to have a glucose of 88 (post treatment for hypoglycemia, reportedly), troponin of 1.25-->1.31-->1.39. EKG showed a-fib with RVR with HR of 101, TWI, I, II, aVF, V3-V6. Echo showed EF 30-35%, mid and apical inferior septum and apex are abnormal, severely dilated LA, mildly dilated RA, mild MR/TR, mild aortic sclerosis without stenosis, moderately increased left ventricular posterior wall thickness. CK 2324, SCr 1.53-->1.41, WBC 20.7-->15.3. He was started on empiric ABX. CXR showed CM, low lung volumes, no acute disease, head CT negative for acute pathology. He is currently  somewhat altered in bed.   Physical Exam:  GEN obese, disheveled   HEENT PERRL, hearing intact to voice, dry oral mucosa   NECK supple  no JVD   RESP normal resp effort  clear BS   CARD Irregular rate and rhythm  No murmur   ABD denies tenderness  soft  obese   EXTR negative edema, right BKA   SKIN sacral ulcerations   NEURO cranial nerves intact   PSYCH alert, poor insight   Review of Systems:  General: Fatigue  Weakness   Skin: No Complaints   ENT: No Complaints   Eyes: No Complaints   Neck: No Complaints   Respiratory: No Complaints   Cardiovascular: No Complaints   Gastrointestinal: Diarrhea   Genitourinary: No Complaints   Vascular: No Complaints   Musculoskeletal: No Complaints   Neurologic: No Complaints   Hematologic: No Complaints   Endocrine: No Complaints   Psychiatric: No Complaints   Review of Systems: All other systems were reviewed and found to be negative   Medications/Allergies Reviewed Medications/Allergies reviewed   Family & Social History:  Family and Social History:  Family History father: CAD, prostate cancer, alz dz, melanoma; mother: aortic aneurysm, anemia   Social History negative ETOH, negative Illicit drugs   + Tobacco Prior (greater than 1 year)  17.5 pack years   Place of Living Home     CHF:    Emphysema:    Diabetes Mellitus, Type II (NIDD):    Hypertension:    right BKA:    Stent -  Cardiac:    CABG (Coronary Artery Bypass Graft):    Angioplasty:   Home Medications: Medication Instructions Status  Lantus 100 units/mL subcutaneous solution 20 unit(s) subcutaneous once a day (at bedtime) Active  citalopram 10 mg oral tablet 1 tab(s) orally once a day Active  gabapentin 300 mg oral capsule 1 cap(s) orally 3 times a day Active  Aleve sodium 220 mg oral tablet 1 tab(s) orally every 8 hours, As Needed Active  One-A-Day Men's Health Formula Multiple Vitamins oral tablet 1 tab(s) orally once a day  Active  nystatin topical 100000 units/g topical cream Apply topically to affected area 2 times a day Active  spironolactone 25 mg oral tablet 1 tab(s) orally once a day Active  Levemir FlexPen 100 units/mL subcutaneous solution 22 unit(s) subcutaneous once a day (at bedtime) Active  sertraline 50 mg oral tablet 1 tab(s) orally once a day Active  furosemide 20 mg oral tablet 1 tab(s) orally once a day Active  nitroglycerin 0.4 mg sublingual tablet 1 tab(s) sublingual every 5 minutes up to 3 doses as needed for chest pain. *if no relief call MD or go to emergency room* Active  carvedilol 12.5 mg oral tablet 1 tab(s) orally 2 times a day Active  Eliquis 5 mg oral tablet 1 tab(s) orally 2 times a day Active   Lab Results:  Hepatic:  04-Apr-16 05:11   Bilirubin, Total 0.9 (0.3-1.2 NOTE: New Reference Range  01/19/15)  Alkaline Phosphatase 82 (38-126 NOTE: New Reference Range  01/19/15)  SGPT (ALT) 29 (17-63 NOTE: New Reference Range  01/19/15)  SGOT (AST)  78 (15-41 NOTE: New Reference Range  01/19/15)  Total Protein, Serum  6.3 (6.5-8.1 NOTE: New Reference Range  01/19/15)  Albumin, Serum  2.5 (3.5-5.0 NOTE: New reference range  01/19/15)  Routine Micro:  03-Apr-16 13:05   Micro Text Report BLOOD CULTURE   COMMENT                   NO GROWTH IN 8-12 HOURS   ANTIBIOTIC                       Culture Comment NO GROWTH IN 8-12 HOURS  Result(s) reported on 14 Feb 2015 at 09:00PM.    14:10   Micro Text Report BLOOD CULTURE   COMMENT                   NO GROWTH IN 8-12 HOURS   ANTIBIOTIC                       Culture Comment NO GROWTH IN 8-12 HOURS  Result(s) reported on 14 Feb 2015 at 10:00PM.    20:38   Micro Text Report CLOSTRIDIUM DIFFICILE   C.DIFFICILE ANTIGEN       C.DIFFICILE GDH ANTIGEN : NEGATIVE   C.DIFFICILE TOXIN A/B     C.DIFFICILE TOXINS A AND B : NEGATIVE   INTERPRETATION            Negative for C. difficile.    ANTIBIOTIC                        Routine  Chem:  03-Apr-16 13:05   Glucose, Serum  152 (65-99 NOTE: New Reference Range  01/19/15)  Creatinine (comp)  1.53 (0.61-1.24 NOTE: New Reference Range  01/19/15)  Lactic Acid  Venous  2.9 (0.5-2.0 NOTE: New Reference Range: 01/19/15)  04-Apr-16 05:11  Result Comment HGB/HCT - RESULTS VERIFIED BY REPEAT TESTING.  Result(s) reported on 15 Feb 2015 at 06:15AM.  Glucose, Serum  181 (65-99 NOTE: New Reference Range  01/19/15)  BUN  49 (6-20 NOTE: New Reference Range  01/19/15)  Creatinine (comp)  1.41 (0.61-1.24 NOTE: New Reference Range  01/19/15)  Sodium, Serum 141 (135-145 NOTE: New Reference Range  01/19/15)  Potassium, Serum 4.3 (3.5-5.1 NOTE: New Reference Range  01/19/15)  Chloride, Serum 108 (101-111 NOTE: New Reference Range  01/19/15)  CO2, Serum 22 (22-32 NOTE: New Reference Range  01/19/15)  Calcium (Total), Serum  7.7 (8.9-10.3 NOTE: New Reference Range  01/19/15)  eGFR (African American) >60  eGFR (Non-African American)  54 (eGFR values <11m/min/1.73 m2 may be an indication of chronic kidney disease (CKD). Calculated eGFR is useful in patients with stable renal function. The eGFR calculation will not be reliable in acutely ill patients when serum creatinine is changing rapidly. It is not useful in patients on dialysis. The eGFR calculation may not be applicable to patients at the low and high extremes of body sizes, pregnant women, and vegetarians.)  Anion Gap 11  Cardiac:  03-Apr-16 13:05   CK, Total  2324 ((75-170NOTE: New Reference Range  01/19/15)  Troponin I  1.25 (0.00-0.03 0.03 ng/mL or less: NEGATIVE  Repeat testing in 3-6 hrs  if clinically indicated. >0.05 ng/mL: POTENTIAL  MYOCARDIAL INJURY. Repeat  testing in 3-6 hrs if  clinically indicated. NOTE: An increase or decrease  of 30% or more on serial  testing suggests a  clinically important change NOTE: New Reference Range  01/19/15)    17:25   Troponin I  1.31 (0.00-0.03 0.03  ng/mL or less: NEGATIVE  Repeat testing in 3-6 hrs  if clinically indicated. >0.05 ng/mL: POTENTIAL  MYOCARDIAL INJURY. Repeat  testing in 3-6 hrs if  clinically indicated. NOTE: An increase or decrease  of 30% or more on serial  testing suggests a  clinically important change NOTE: New Reference Range  01/19/15)    20:51   Troponin I  1.39 (0.00-0.03 0.03 ng/mL or less: NEGATIVE  Repeat testing in 3-6 hrs  if clinically indicated. >0.05 ng/mL: POTENTIAL  MYOCARDIAL INJURY. Repeat  testing in 3-6 hrs if  clinically indicated. NOTE: An increase or decrease  of 30% or more on serial  testing suggests a  clinically important change NOTE: New Reference Range  01/19/15)  04-Apr-16 05:11   CK, Total  1063 ([phone]: New Reference Range  01/19/15)  Routine Hem:  03-Apr-16 13:05   WBC (CBC)  20.7  Hemoglobin (CBC)  11.3  Hematocrit (CBC)  36.6  04-Apr-16 05:11   WBC (CBC)  15.3  RBC (CBC) 4.40  Hemoglobin (CBC)  10.5  Hematocrit (CBC)  34.7  Platelet Count (CBC) 159  MCV  79  MCH  23.9  MCHC  30.4  RDW  18.8  Neutrophil % 87.8  Lymphocyte % 5.9  Monocyte % 6.2  Eosinophil % 0.0  Basophil % 0.1  Neutrophil #  13.4  Lymphocyte #  0.9  Monocyte # 1.0  Eosinophil # 0.0  Basophil # 0.0   EKG:  EKG Interp. by me   Interpretation a-fib with RVR, 101 bpm, TWI I, II, aVF, V3-V6   Radiology Results: XRay:    03-Apr-16 12:09, Chest Portable Single View  Chest Portable Single View   REASON FOR EXAM:    weakness, syncope  COMMENTS:       PROCEDURE: DXR - DXR PORTABLE  CHEST SINGLE VIEW  - Feb 14 2015 12:09PM     CLINICAL DATA:  Weakness for 3 days.  Syncope.  Hypotensive.    EXAM:  PORTABLE CHEST - 1 VIEW    COMPARISON:  12/30/2014    FINDINGS:  Prior median sternotomy. Apical lordotic positioning. Midline  trachea. Cardiomegaly accentuated by AP portable technique. No  pleural effusion or pneumothorax. Low lung volumes with resultant  pulmonary  interstitial prominence. No congestive failure.     IMPRESSION:  Cardiomegaly and low lung volumes, without acute disease.      Electronically Signed    By: Abigail Miyamoto M.D.    On: 02/14/2015 12:54         Verified By: Areta Haber, M.D.,  Cardiology:    04-Apr-16 08:49, Echo Doppler  Echo Doppler   REASON FOR EXAM:      COMMENTS:       PROCEDURE: [place] - ECHO DOPPLER COMPLETE(TRANSTHOR)  - Feb 15 2015  8:49AM     RESULT: Echocardiogram Report    Patient Name:   [name] Date of Exam: 02/15/2015  Medical Rec #:  [id number]        Custom1:  Date of Birth:  22-Feb-1956     Height:  Patient Age:    43 years      Weight:  Patient Gender: M             BSA:    Indications: Atrial Fib  Sonographer:    Sherrie Sport RDCS  Referring Phys: MODY, SITAL, P    Sonographer Comments: Technically difficult study due to poor echo   windows.    Summary:   1. Left ventricular ejection fraction, by visual estimation, is 30 to   35%.   2. Mid and apical inferior septum and apex are abnormal.   3. Severely dilated left atrium.   4. Mildly dilated right atrium.   5. Mild mitral valve regurgitation.   6. Mild aortic valve sclerosis without stenosis.   7. Moderately increased left ventricular posterior wall thickness.   8. Mild tricuspid regurgitation.  2D AND M-MODE MEASUREMENTS (normal ranges within parentheses):  Left Ventricle:          Normal  IVSd (2D):      0.97 cm (0.7-1.1)  LVPWd (2D):     1.32 cm (0.7-1.1) Aorta/LA:                  Normal  LVIDd (2D):     5.07 cm (3.4-5.7) Aortic Root (2D): 3.30 cm (2.4-3.7)  LVIDs (2D):     4.32 cm           Left Atrium (2D): 5.00 cm (1.9-4.0)  LV FS (2D):     14.8 %   (>25%)  LV EF (2D):     31.2 %   (>50%)                                    Right Ventricle:                                    RVd (2D):        2.62 cm  LV DIASTOLIC FUNCTION:  MV Peak E: 0.93 m/sE/e' Ratio: 14.50  Decel Time: 169 msec  SPECTRAL DOPPLER  ANALYSIS (where applicable):  Mitral Valve:  MV P1/2 Time: 49.01 msec  MV Area, PHT: 4.49 cm??  Aortic Valve: AoV Max Vel: 0.83 m/s AoV Peak PG: 2.8 mmHg AoV Mean PG:  LVOT Vmax: 0.59 m/s LVOT VTI:  LVOT Diameter: 2.10 cm  AoV Area, Vmax: 2.44 cm?? AoV Area, VTI:  AoV Area, Vmn:  Tricuspid Valve and PA/RV Systolic Pressure: TR Max Velocity: 1.44 m/s RA   Pressure: 5 mmHg RVSP/PASP: 13.3 mmHg  Pulmonic Valve:  PV Max Velocity: 0.54 m/s PV Max PG: 1.2 mmHg PV Mean PG:    PHYSICIAN INTERPRETATION:  Left Ventricle: The left ventricular internal cavity size was normal. LV   posterior wall thickness was moderately increased. Left ventricular   ejection fraction, by visual estimation, is 30 to 35%.  LV Wall Scoring:  The mid and apical inferior septum and apex are hypokinetic.  Right Ventricle: The right ventricular size is mildly enlarged.  Left Atrium: The left atrium is severely dilated.  Right Atrium: The right atrium is mildly dilated.  Mitral Valve: Mild mitral valve regurgitation is seen.  Tricuspid Valve: Mild tricuspid regurgitation is visualized. The   tricuspid regurgitant velocity is 1.44 m/s, and with an assumed right   atrial pressure of 5 mmHg, the estimated right ventricular systolic   pressure is normal at 13.3 mmHg.  Aortic Valve: The aortic valve is tricuspid. Mild aortic valve sclerosis   is present, with no evidence of aortic valve stenosis. Trivial aortic   valve regurgitation is seen.    [place] MD  Electronically signed by [id number] Neoma Laming MD  Signature Date/Time: 02/15/2015/10:37:10 AM    *** Final ***    IMPRESSION: .        Verified By: Emmit Pomfret. Humphrey Rolls, M.D., MD  CT:    03-Apr-16 12:55, CT Head Without Contrast  CT Head Without Contrast   REASON FOR EXAM:    weakness, impaired speech  COMMENTS:       PROCEDURE: CT  - CT HEAD WITHOUT CONTRAST  - Feb 14 2015 12:55PM     CLINICAL DATA:  Slurred speech, found on floor    EXAM:  CT HEAD  WITHOUT CONTRAST    TECHNIQUE:  Contiguous axial images were obtained from the base of the skull  through the vertex without intravenous contrast.    COMPARISON:  12/25/2014  FINDINGS:  Bony calvarium is intact. Very mild atrophic changes are seen. No  findings to suggest acute hemorrhage, acute infarction or  space-occupying mass lesion are noted.     IMPRESSION:  Atrophic changes without acute abnormality.      Electronically Signed    By: Inez Catalina M.D.    On: 02/14/2015 13:49         Verified By: Everlene Farrier, M.D.,    No Known Allergies:   Vital Signs/Nurse's Notes: **Vital Signs.:   04-Apr-16 11:23  Vital Signs Type Routine  Temperature Temperature (F) 98.6  Celsius 37  Temperature Source oral  Pulse Pulse 85  Respirations Respirations 22  Systolic BP Systolic BP 784  Diastolic BP (mmHg) Diastolic BP (mmHg) 83  Mean BP 100  Pulse Ox % Pulse Ox % 97  Pulse Ox Activity Level  At rest  Oxygen Delivery 2L    Impression 59 year old male with history of CAD s/p CABG in 80s/90s s/p stenting 04/2013, PAF on Eliquis, chronic systolic heart failure due to ischemic cardiomyopathy, HTN,  IDDM, PVD s/p right BKA, pulmonary emphysema, and obesity who presented to [facility] on 4/4 after being found on the floor covered in feces for and unknown amount of time. Upon his arrival to [facility] he was noted to be in rhabdo (CK 2324) and be in a-fib with RVR with HR of 101 with troponin of 1.25-->1.31-->1.39. Cardiology was consulted for further evaluation of the a-fib and elevated troponin.   1. Elevated troponin/CAD as above: -Likely in the setting of rhabdo as patient was found down on the floor for days covered in feces and multiple pressure ulcerations vs NSTEMI -Last cardiac cath 05/2013 indicated medical management with EF 35% -Echo 02/15/2015 showed EF 30-35%, mid and apical inferior septum and apex are hypokinetic, no redo cath targets (from cath 05/2013) -Continue medical treatment  with home medications   2. A-fib with RVR: -Presented with HR of 101 -Currently with a-fib with controlled ventricular rate in the 80s -Continue home medications (Coreg 12.5 mg) -With his pressure ulcers, some bleeding may need to discontinue Eliquis, await wound consult  3. Altered mental status: -Has had diarrhea and poor po intake for several days leading up to precipitating event -Cultures pending -Presented hypoglycemic  4. Cellulitis/ulcerations: -Wound care team  5. IDDM: -Per IM  6. PVD: -Status post right BKA  7. Dispo: -Will need placement   Electronic Signatures for Addendum Section:  Kathlyn Sacramento (MD) (Signed Addendum 04-Apr-16 18:15)  The patient was seen and examined. Agree with the above. He was found down in poor conditions. He seems slightly confused. He is well known to me and usually is fully alert and oriented. There is a 2/6 SEM at the aortic area. There is a large wound on left shin with oozing.  TnI was mildly elevated.  Elevated TnI is likely due to supply demand ischemia. cardiac cath in 2014 showed occluded grafts except LIMA to LAD with collaterals to LCX/RCA and EF of 35%. Continue medical therapy.  Continue rate control for A-fib. Can hold anticoaulation if needed for wound debridement. Wound care is recommended. He likely has underling PAD on the left as well and might require angiography.   Electronic Signatures: Kathlyn Sacramento (MD)  (Signed 04-Apr-16 18:15)  Co-Signer: General Aspect/Present Illness, History and Physical Exam, Review of System, Family & Social History, Past Medical History, Home Medications, EKG , Radiology, Allergies Laruth Hanger M (PA-C)  (Signed 04-Apr-16 12:07)  Authored: General Aspect/Present Illness, History and Physical Exam, Review of System, Family & Social History, Past Medical History, Home Medications, Labs, EKG , Radiology, Allergies, Vital Signs/Nurse's Notes, Impression/Plan   Last Updated: 04-Apr-16 18:15 by  Kathlyn Sacramento (MD)

## 2015-03-14 NOTE — Discharge Summary (Signed)
PATIENT NAME:  [name], [name] MR#:  [id number] DATE OF BIRTH:  05-01-1956  DATE OF ADMISSION:  02/14/2015 DATE OF DISCHARGE:  02/19/2015  PRIMARY CARE PHYSICIAN: [facility].    FINAL DIAGNOSES:  1.  Rhabdomyolysis.  2.  Acute encephalopathy.  3.  Cellulitis left lower extremity with ulcerations.  4.  Stage III decubitus ulcer.  5.  Atrial fibrillation.  6.  Diabetes.  7.  Acute renal failure.  8.  Elevated troponin.  9.  Diarrhea.   MEDICATIONS ON DISCHARGE: Include Coreg 12.5 mg twice a day, Eliquis 5 mg twice a day, Celexa 10 mg daily, gabapentin 300 mg 3 times a day, One-a-Day multivitamin 1 tablet daily, nystatin topical cream applied to affected area twice a day, spironolactone 25 mg daily, furosemide 20 mg daily, nitroglycerin 0.4 mg sublingual tablet every 5 minutes as needed for chest pain, Zoloft 50 mg at bedtime, Lantus 28 units subcutaneous injection at bedtime, oxycodone 5 mg every 4 hours as needed for severe pain, Ramipril 2.5 mg at bedtime, Humulin R sliding scale as directed, before meals and at bedtime, Imodium 2 mg 4 times a day as needed for diarrhea. Santyl ointment applied topically once a day to affected area, Keflex 500 mg every 8 hours for 7 more days.      TREATMENTS:   Foley to leg bag, change Foley every 3 weeks; would like to remove Foley as soon as possible, once sacral decubitus healed a little bit more.  Santyl to the wounds on the legs and cover with dry dressing.   DIET: Low sodium, carbohydrate-controlled diet, regular consistency.   ACTIVITY: As tolerated.   FOLLOW-UP:  With the wound care center, 1 to 2 days with doctor at rehab.   HOSPITAL COURSE: The patient was admitted 02/14/2015, and discharged 02/19/2015. The patient came in with altered mental status, was unable to give much history, found to have a CPK greater than 2000, ulcerations on his sacrum left lower extremity.  The patient was admitted with altered mental status,  rhabdomyolysis, hypoglycemia, elevated troponin, and cellulitis of the lower extremities, started on IV fluids and IV antibiotics.    LABORATORY AND RADIOLOGICAL DATA DURING THE HOSPITAL COURSE: Included a chest x-ray showed cardiomegaly low lung volumes. EKG showed atrial fibrillation, rapid ventricular response. Urinalysis negative.  CT scan of the head:  Atrophic changes without acute abnormality.  Lactic acid 2.9. Blood cultures negative. Troponin 1.25. White blood cell count 20.7, hemoglobin 11.3, hematocrit 36.6, platelet count of 214.  Glucose 152, BUN 51, creatinine 1.53, sodium 142, potassium 4.3, chloride 104, CO2 of 25, calcium 8.5. Liver function tests: AST elevated at 95, albumin low at 2.9. Troponin 1.31. Stool for C. difficile negative. Troponin 1.39, CPK 1063.  Echocardiogram showed an ejection fraction of 30% to 35%, severely dilated left atrium, mildly dilated right atrium, TSH 3.314, creatinine upon discharge 1.1. White count upon discharge 9.2, hemoglobin 10.6. Stool for C. difficile was negative.   CONSULTANTS DURING THE HOSPITAL COURSE: Included cardiology Eula Listen, psychiatry Dr. Toni Amend, physical therapy.   HOSPITAL COURSE PER PROBLEM LIST:  1.  For the patient's rhabdomyolysis, this is likely from being down on the ground for 2 days. The patient unsure what happened.  The patient was given IV fluids during the entire hospital course. CPK trended better.  2.  Acute encephalopathy, likely secondary to multiple reasons, being on the floor and dehydrated, rhabdomyolysis and cellulitis with elevated white count.  This has improved. The patient understands  that he needs rehab and will go to rehab.  3.  Cellulitis of the left lower extremity with ulcerations. The patient was given IV Ancef during the hospital course.  Will continue Keflex.  Further follow-up at wound care wound care center will be needed. Wound care nursing team did see the patient in the hospital.  4.  Stage III  decubitus, initially it was bleeding so I held the Eliquis. It is not bleeding, Eliquis was restarted. It does look like it has a little draining sinus.  Follow up at the wound care center for this. Must keep it clean from diarrhea and urine.  Foley catheter kept in for this reason.  At this point, would like to remove the Foley catheter as soon as feasible, once this heals up a little bit more.  5.  Atrial fibrillation. The patient is anticoagulated with Eliquis, rate-controlled with Coreg.  6.  Diabetes.  Sugars are now on the higher side. I am titrating up the Lantus insulin to 28 units, also with sliding scale. I think this is because the patient has been eating better.  7.  Acute renal failure. This improved with IV fluid hydration.  8.  Elevated troponin, likely demand ischemia from rhabdomyolysis, acute renal failure, and infection.  Cardiology did not want to do anything aggressive at this point. The patient does have a cardiomyopathy on echocardiogram, so low dose Altace was added to his Coreg and Eliquis and spironolactone. I will restart Lasix upon discharge on low dose Lasix at this point.  9.  Diarrhea, stool for Clostridium difficile negative, can give Imodium as needed.    TIME SPENT ON DISCHARGE: 35 minutes.     ____________________________ Herschell Dimesichard J. Renae GlossWieting, MD rjw:DT D: 02/19/2015 08:14:00 ET T: 02/19/2015 08:43:39 ET JOB#: [id number]  cc: Herschell Dimesichard J. Renae GlossWieting, MD, <Dictator> [facility]  Salley ScarletICHARD J Joshu Furukawa MD ELECTRONICALLY SIGNED 03/03/2015 12:49

## 2015-03-14 NOTE — H&P (Signed)
PATIENT NAME:  [name], [name] MR#:  [id number] DATE OF BIRTH:  1955/12/03  DATE OF ADMISSION:  02/14/2015  REFERRING PHYSICIAN: Darien Ramusavid W. Kaminski, MD  FAMILY PHYSICIAN: [facility].   REASON FOR ADMISSION: Altered mental status.   HISTORY OF PRESENT ILLNESS: The patient is a 59 year old male with a significant history of coronary artery disease, status post CABG, peripheral vascular disease, diabetes, and hypertension. The patient lives alone and is followed by [facility]. He was found by his neighbors today lying on the floor covered in feces. He was initially noted to be hypoglycemic. He was brought to the Emergency Room where he was confused and lethargic and remained so. His CPK is greater than 2000. Blood sugar initially after treatment was 88 and has now improved to 152, but the patient remains altered. He has multiple ulcerations noted on his sacrum and left lower extremity. He is now admitted for further evaluation.   PAST MEDICAL HISTORY:  1.  ASCVD, status post CABG.  2.  Peripheral vascular disease.  3.  Benign hypertension.  4.  Type 2 diabetes.  5.  COPD.  6.  History of CHF.  7.  Status post right BKA.   MEDICATIONS:  1.  Zoloft 25 mg p.o. daily.  2.  Multivitamin 1 p.o. daily.  3.  Lantus 20 units subcutaneous at bedtime.  4.  Gabapentin 300 mg p.o. t.i.Frank.   5.  Eliquis 5 mg p.o. b.i.Frank.  6.  Citalopram 10 mg p.o. daily.  7.  Coreg 12.5 mg p.o. b.i.Frank.  8.  Aldactone 25 mg p.o. daily.   ALLERGIES: No known drug allergies.   SOCIAL HISTORY: Negative for alcohol or tobacco abuse.   FAMILY HISTORY: Unknown.   REVIEW OF SYSTEMS: Unable to obtain, due to patient's confusion and no available family.   PHYSICAL EXAMINATION:  GENERAL: The patient is confused, lethargic, but in no acute distress.  VITAL SIGNS: Currently remarkable for a blood pressure of 133/88 with a heart rate of 95, respiratory rate of 22, temperature of 98.9, saturation 95%  on room air.  HEENT: Normocephalic, atraumatic. Pupils equally round and reactive to light and accommodation. Extraocular movements are intact. Sclerae are not icteric. Conjunctivae are clear. Oropharynx is dry but clear. NECK: Supple without JVD. No adenopathy or thyromegaly is noted.  LUNGS: Clear to auscultation and percussion without wheezes, rales or rhonchi. No dullness. Respiratory effort is normal.  CARDIAC: Irregularly irregular rhythm. No significant rubs or gallops. PMI is nondisplaced. Chest wall is nontender.  ABDOMEN: Soft, nontender with normoactive bowel sounds. No organomegaly or masses were appreciated. No hernias or bruits were noted.  EXTREMITIES: Revealed a right below the knee amputation. Multiple ulcerations were noted on the left lower extremity and sacrum with surrounding erythema and some purulence. Distal pulses were 1+ bilaterally.  NEUROLOGIC: Revealed cranial nerves II-XII grossly intact. Deep tendon reflexes were symmetric. Motor and sensory exams nonfocal.  PSYCHIATRIC: Revealed a patient who is awake but lethargic. Speech was slurred. He was confused and could not answer questions appropriately.   LABORATORY DATA: EKG revealed atrial fibrillation with no acute ischemic changes. Head CT was unremarkable, except for atrophy. Chest x-ray revealed cardiomegaly with no acute disease.   His white count was 20.7 with a hemoglobin of 11.3. Glucose was 152 with a BUN of 51, creatinine 1.53 with a GFR of 49. Troponin was 1.25. His total CK was 2324, with an MB of 45.   ASSESSMENT:  1.  Altered mental  status due to metabolic encephalopathy.  2.  Hypoglycemia.  3.  Rhabdomyolysis.  4.  Elevated troponin of unclear significance.  5.  Atherosclerotic cardiovascular disease, status post coronary artery bypass graft.  6.  Type 2 diabetes.  7.  Peripheral vascular disease.  8.  Multiple skin ulcerations.  9.  Cellulitis of the lower extremity.   PLAN: The patient will be  admitted as a full code to the floor with off unit telemetry. We will begin IV fluids with IV antibiotics. Cultures have been sent. We will follow serial cardiac enzymes and follow his sugars closely. We will hold his Lantus at this time, but use sliding scale insulin as needed. We will consult the wound care team because of his multiple ulcerations. We will consult cardiology because of his atrial fibrillation and elevated troponin. We will obtain a social work consult for placement, as the patient is unable to live by himself at this time. Follow up routine labs in the morning. We will continue Eliquis for now.   Further treatment and evaluation will depend upon the patient's progress.   TOTAL TIME SPENT ON THIS PATIENT: Fifty minutes.    ____________________________ Duane Lope Judithann Sheen, MD jds:TM Frank: 02/14/2015 14:21:38 ET T: 02/14/2015 16:31:43 ET JOB#: [id number]  cc: Duane Lope. Judithann Sheen, MD, <Dictator> JEFFREY Rodena Medin MD ELECTRONICALLY SIGNED 02/14/2015 20:28

## 2015-03-14 NOTE — Consult Note (Signed)
Psychiatry: Follow-up for this patient who had a delirium related to multiple etiologies.  Today the patient states he is feeling much better.  Still having some abdominal pain.  Still week and was not able to stand up today.  He is able to eat a little bit better. review of systems the patient has no complaints about his mood and no psychotic symptoms.  As mentioned above still has some physical complaints. mental status the patient is alert and oriented.  He appears more consistently awake than yesterday.  Maintains a more coherent conversation.  No suicidal ideation.  Patient is able to identify his medical problems and also identify pros and cons of going to assisted living versus going home.  He is most concerned about the well-being of his dog. the patient seems much more appropriately capable of making decisions.  I think his capacity has probably improved.  Fortunately at this time he seems to be in agreement with going to assisted living because he understands his medical problems. change to diagnosis except the delirium is improving.  No change to any medical treatment.  Follow-up as needed.  Electronic Signatures: Clapacs, Jackquline DenmarkJohn T (MD)  (Signed on 05-Apr-16 17:05)  Authored  Last Updated: 05-Apr-16 17:05 by Audery Amellapacs, John T (MD)

## 2015-03-14 NOTE — Consult Note (Signed)
Psychiatry: Follow up. PAtient reports he is feeling physically better but somewhat ill at ease with treatment plan in that he is unsure of his destination. Nevertheless he is still able to articulate a desire to go to a safe disposition. ROS unchanged. Affect flattish but not depressed. Sersorium clear. Appears to now be capable of reasonable judgement regarding care. Will continue to follow.  Electronic Signatures: Rosia Syme, Jackquline DenmarkJohn T (MD)  (Signed on 07-Apr-16 22:52)  Authored  Last Updated: 07-Apr-16 22:52 by Audery Amellapacs, Macklen Wilhoite T (MD)

## 2015-03-14 NOTE — Consult Note (Signed)
PATIENT NAME:  [name], [name] MR#:  [id number] DATE OF BIRTH:  06-May-1956  DATE OF CONSULTATION:  02/15/2015  REFERRING PHYSICIAN:   CONSULTING PHYSICIAN:  Audery Amel, MD  IDENTIFYING INFORMATION AND REASON FOR CONSULTATION: This is a 59 year old man with a history of multiple medical problems, who was brought into the hospital after being found with altered mental status. Consult is for "capacity."   HISTORY OF PRESENT ILLNESS: Information from the patient and the chart. Also discussed the case with the social worker, Franchot Gallo. The patient was brought in the hospital on the 3rd after being found by a neighbor, passed out for an indeterminate amount of time. The patient is able to tell me that he is in the hospital because he was passed out. He knows that he was found by a neighbor. He does not recall anything about the events leading up to being passed out and does not have any speculation about what could have caused it. He tells me that he is feeling a little bit better today and that he thinks he is getting better every day. He does not really identify a specific chief complaint right now. He says that he is still feeling tired, but otherwise, denies specific medical complaints. Psychiatrically, he says that his mood is feeling okay. He still feels tired and sleepy. He recalls his mood before coming into the hospital as having been fine and denies having had problems with sleep or appetite. He denies any psychotic symptoms. The patient is rambling in his history and at times, hard to follow. He tells me that he thinks he would like to get back home as soon as possible. When asked directly whether he thinks he is in any physical shape to take care of himself at home, he changes the subject and becomes more vague.   PAST PSYCHIATRIC HISTORY: The patient denies ever seeing a psychiatrist, being in a psychiatric hospital, or being prescribed any psychiatric medicine. He later corrects himself  to say that he thinks that he takes antidepressants, but that they are for antianxiety treatment, not really depression. Denies any history of having tried to kill himself. Denies any history of violence.   SOCIAL HISTORY: The patient lives by himself. He tells me that he and his brother had lived together until his brother passed away 1 or 2 years ago. Since then, the patient says that he has been taking care of himself at home. He says he thinks he does a reasonably good job. He claims that the [employer] drives him back and forth to his doctor's appointments and that neighbors bring him groceries. At first, we did not know of any family members. Apparently, social work is starting to make contact with some family members now that he is in the hospital.   PAST MEDICAL HISTORY: The patient has a history of multiple medical problems, including coronary artery disease status post CABG, peripheral vascular disease, hypertension, diabetes, COPD, history of congestive heart failure, and status post below-knee amputation on the right side.   SUBSTANCE ABUSE HISTORY: He denies that he drinks alcohol or abuses drugs or has ever had any kind of substance abuse problem.   REVIEW OF SYSTEMS: Largely negative. I am not sure he understands all the questions and sometimes he is vague in his answers, but he did not have any particular complaints.   MENTAL STATUS EXAMINATION: Disheveled, poorly groomed man who looks older than his stated age. He was asleep when I came  into the room, but woke up easily. He attempted to be cooperative, but frequently fell back asleep or drifted off topic. Eye contact: Only intermittent. Psychomotor activity is still very slow. He seems to have some trouble using his hands in a coordinated manner. Speech was slurred and at times, difficult to understand, very slow and limited in amount. Affect: Flat. Mood: Stated as being okay. Thoughts were rambling. At times, he answered with what  appeared to be non sequiturs and at other times, was able to give reasonable answers to questions. He denied any auditory or visual hallucinations and did not appear to be responding to internal stimuli. He was alert and oriented x 4. I was able to get him to repeat 3 words immediately. He had no idea about any of them at 3 minutes. In fact, did not remember that we had talked about the words in the first place. Judgment and insight: Appear to be impaired, cognitively impaired. He is not able to describe for me his medical problems. Does not know any of the medicines that he is normally taking.   FAMILY HISTORY: Denies any family history of mental illness.   MEDICATIONS: On admission, he was taking Zoloft 25 mg a day, citalopram 10 mg a day, Lantus insulin 20 units at night, gabapentin 300 mg 3 times a day, Eliquis 5 mg twice a day, Coreg 12.5 mg twice a day, Aldactone 25 mg a day.   ALLERGIES: No known drug allergies.   LABORATORY RESULTS: As of today, total protein and albumin still low at 6.3 and 2.5, AST elevated at 78, creatinine elevated at 1.41, glucose elevated at 181. CKs a bit elevated, but are coming down.   VITAL SIGNS: Blood pressure 115/73, respirations 20, pulse 78, temperature 98.2.   ASSESSMENT: This is a 59 year old man who was found very dirty and passed out at home. Question is about his capacity to make decisions. Capacity in the abstract is usually difficult to answer without being able to inquire about a specific plan. In this patient's case, however, he is really not able to discuss his medical problems, even in broad terms. He is not able to give any reassurance about being able to take care of himself. Many of his answers range very much off question, and he clearly has severe short-term memory impairment. It is possible that these are transitory things and could get better, but at the moment, I think his capacity should probably be considered diminished. If it comes to a  disagreement about going home, we need to re-evaluate that more specifically.   TREATMENT PLAN: No change to any medication. I will follow up the patient while he is in the hospital. If we need to re-evaluate for specific questions, we can do it at that time.   DIAGNOSIS, PRINCIPAL AND PRIMARY:  AXIS I: Dementia, due to multiple medical problems.   SECONDARY DIAGNOSES:  AXIS I: Delirium, rule out depression.  AXIS II: Deferred.  AXIS III: Status post rhabdomyolysis, diabetes, coronary artery disease.     ____________________________ Audery AmelJohn T. Punam Broussard, MD jtc:mw D: 02/15/2015 18:14:24 ET T: 02/15/2015 18:50:45 ET JOB#: [id number]  cc: Audery AmelJohn T. Talynn Lebon, MD, <Dictator> Audery AmelJOHN T Jaydn Fincher MD ELECTRONICALLY SIGNED 02/16/2015 22:36

## 2015-03-21 ENCOUNTER — Inpatient Hospital Stay
Admission: EM | Admit: 2015-03-21 | Discharge: 2015-03-25 | DRG: 377 | Disposition: A | Discharge status: Skilled Nursing Facility | Payer: Medicare Other | Attending: Internal Medicine | Admitting: Internal Medicine

## 2015-03-21 ENCOUNTER — Encounter: Payer: Self-pay | Admitting: Emergency Medicine

## 2015-03-21 ENCOUNTER — Emergency Department: Payer: Medicare Other

## 2015-03-21 DIAGNOSIS — K7460 Unspecified cirrhosis of liver: Secondary | ICD-10-CM | POA: Diagnosis present

## 2015-03-21 DIAGNOSIS — I25110 Atherosclerotic heart disease of native coronary artery with unstable angina pectoris: Secondary | ICD-10-CM | POA: Diagnosis present

## 2015-03-21 DIAGNOSIS — F419 Anxiety disorder, unspecified: Secondary | ICD-10-CM | POA: Diagnosis present

## 2015-03-21 DIAGNOSIS — R001 Bradycardia, unspecified: Secondary | ICD-10-CM | POA: Diagnosis present

## 2015-03-21 DIAGNOSIS — I4892 Unspecified atrial flutter: Secondary | ICD-10-CM | POA: Diagnosis present

## 2015-03-21 DIAGNOSIS — Z993 Dependence on wheelchair: Secondary | ICD-10-CM

## 2015-03-21 DIAGNOSIS — Y846 Urinary catheterization as the cause of abnormal reaction of the patient, or of later complication, without mention of misadventure at the time of the procedure: Secondary | ICD-10-CM | POA: Diagnosis present

## 2015-03-21 DIAGNOSIS — Z79899 Other long term (current) drug therapy: Secondary | ICD-10-CM

## 2015-03-21 DIAGNOSIS — L97519 Non-pressure chronic ulcer of other part of right foot with unspecified severity: Secondary | ICD-10-CM | POA: Diagnosis present

## 2015-03-21 DIAGNOSIS — I5022 Chronic systolic (congestive) heart failure: Secondary | ICD-10-CM | POA: Diagnosis present

## 2015-03-21 DIAGNOSIS — Z960 Presence of urogenital implants: Secondary | ICD-10-CM

## 2015-03-21 DIAGNOSIS — Z89511 Acquired absence of right leg below knee: Secondary | ICD-10-CM

## 2015-03-21 DIAGNOSIS — Z7901 Long term (current) use of anticoagulants: Secondary | ICD-10-CM

## 2015-03-21 DIAGNOSIS — I10 Essential (primary) hypertension: Secondary | ICD-10-CM | POA: Diagnosis present

## 2015-03-21 DIAGNOSIS — T83511A Infection and inflammatory reaction due to indwelling urethral catheter, initial encounter: Secondary | ICD-10-CM

## 2015-03-21 DIAGNOSIS — R51 Headache: Secondary | ICD-10-CM | POA: Diagnosis present

## 2015-03-21 DIAGNOSIS — T8351XA Infection and inflammatory reaction due to indwelling urinary catheter, initial encounter: Secondary | ICD-10-CM | POA: Diagnosis present

## 2015-03-21 DIAGNOSIS — R0781 Pleurodynia: Secondary | ICD-10-CM

## 2015-03-21 DIAGNOSIS — Z978 Presence of other specified devices: Secondary | ICD-10-CM

## 2015-03-21 DIAGNOSIS — L89159 Pressure ulcer of sacral region, unspecified stage: Secondary | ICD-10-CM | POA: Diagnosis present

## 2015-03-21 DIAGNOSIS — Z951 Presence of aortocoronary bypass graft: Secondary | ICD-10-CM

## 2015-03-21 DIAGNOSIS — K625 Hemorrhage of anus and rectum: Secondary | ICD-10-CM

## 2015-03-21 DIAGNOSIS — I482 Chronic atrial fibrillation: Secondary | ICD-10-CM | POA: Diagnosis present

## 2015-03-21 DIAGNOSIS — Z794 Long term (current) use of insulin: Secondary | ICD-10-CM

## 2015-03-21 DIAGNOSIS — E11621 Type 2 diabetes mellitus with foot ulcer: Secondary | ICD-10-CM | POA: Diagnosis present

## 2015-03-21 DIAGNOSIS — Z7982 Long term (current) use of aspirin: Secondary | ICD-10-CM

## 2015-03-21 DIAGNOSIS — I959 Hypotension, unspecified: Secondary | ICD-10-CM | POA: Diagnosis present

## 2015-03-21 DIAGNOSIS — N390 Urinary tract infection, site not specified: Secondary | ICD-10-CM | POA: Diagnosis present

## 2015-03-21 DIAGNOSIS — L89153 Pressure ulcer of sacral region, stage 3: Secondary | ICD-10-CM | POA: Diagnosis present

## 2015-03-21 DIAGNOSIS — I252 Old myocardial infarction: Secondary | ICD-10-CM

## 2015-03-21 DIAGNOSIS — Z87891 Personal history of nicotine dependence: Secondary | ICD-10-CM

## 2015-03-21 DIAGNOSIS — J439 Emphysema, unspecified: Secondary | ICD-10-CM | POA: Diagnosis present

## 2015-03-21 DIAGNOSIS — K922 Gastrointestinal hemorrhage, unspecified: Secondary | ICD-10-CM | POA: Diagnosis present

## 2015-03-21 DIAGNOSIS — Z6830 Body mass index (BMI) 30.0-30.9, adult: Secondary | ICD-10-CM

## 2015-03-21 DIAGNOSIS — K921 Melena: Principal | ICD-10-CM | POA: Diagnosis present

## 2015-03-21 DIAGNOSIS — N170 Acute kidney failure with tubular necrosis: Secondary | ICD-10-CM | POA: Diagnosis present

## 2015-03-21 DIAGNOSIS — E119 Type 2 diabetes mellitus without complications: Secondary | ICD-10-CM | POA: Diagnosis present

## 2015-03-21 DIAGNOSIS — L89899 Pressure ulcer of other site, unspecified stage: Secondary | ICD-10-CM | POA: Diagnosis present

## 2015-03-21 DIAGNOSIS — R634 Abnormal weight loss: Secondary | ICD-10-CM | POA: Diagnosis present

## 2015-03-21 DIAGNOSIS — R32 Unspecified urinary incontinence: Secondary | ICD-10-CM | POA: Diagnosis present

## 2015-03-21 DIAGNOSIS — I25810 Atherosclerosis of coronary artery bypass graft(s) without angina pectoris: Secondary | ICD-10-CM | POA: Diagnosis present

## 2015-03-21 DIAGNOSIS — E669 Obesity, unspecified: Secondary | ICD-10-CM | POA: Diagnosis present

## 2015-03-21 HISTORY — DX: Heart failure, unspecified: I50.9

## 2015-03-21 LAB — CBC
HCT: 36.6 % — ABNORMAL LOW (ref 40.0–52.0)
Hemoglobin: 11.6 g/dL — ABNORMAL LOW (ref 13.0–18.0)
MCH: 24 pg — AB (ref 26.0–34.0)
MCHC: 31.7 g/dL — AB (ref 32.0–36.0)
MCV: 75.7 fL — AB (ref 80.0–100.0)
PLATELETS: 145 10*3/uL — AB (ref 150–440)
RBC: 4.84 MIL/uL (ref 4.40–5.90)
RDW: 19.7 % — ABNORMAL HIGH (ref 11.5–14.5)
WBC: 7.7 10*3/uL (ref 3.8–10.6)

## 2015-03-21 LAB — TROPONIN I: Troponin I: 0.05 ng/mL — ABNORMAL HIGH (ref <0.031)

## 2015-03-21 LAB — BASIC METABOLIC PANEL
Anion gap: 9 (ref 5–15)
BUN: 30 mg/dL — ABNORMAL HIGH (ref 6–20)
CO2: 28 mmol/L (ref 22–32)
Calcium: 9 mg/dL (ref 8.9–10.3)
Chloride: 100 mmol/L — ABNORMAL LOW (ref 101–111)
Creatinine, Ser: 1.09 mg/dL (ref 0.61–1.24)
GFR calc non Af Amer: >60 mL/min (ref >60)
Glucose, Bld: 342 mg/dL — ABNORMAL HIGH (ref 65–99)
POTASSIUM: 3.9 mmol/L (ref 3.5–5.1)
SODIUM: 137 mmol/L (ref 135–145)

## 2015-03-21 NOTE — ED Notes (Signed)
Pt with ulcer noted to sacrum 6cmx2cm x1cm. Pt with dark melana like stool noted coming from rectum with dark blood around it. Dr. Cyril Loosenkinner notified. Critical troponin of 0.05 called from lab. Dr. Cyril Loosenkinner notified. Pt continues to complain of chest pain, unable to quantify level will respond with 4-8.

## 2015-03-21 NOTE — ED Notes (Signed)
Pt with doppler utilization to find pulse in left foot with success. Pt with wound to left anterior lower to mid leg, red in appearance approx 16cm in length with serous drainage, dressing intact.

## 2015-03-21 NOTE — ED Notes (Signed)
Report received from susan, rn.  

## 2015-03-21 NOTE — ED Notes (Signed)
Pt states pain started today around 3pm - pain in left chest/rib/upper abd area. Also has low abd pain

## 2015-03-22 ENCOUNTER — Encounter: Payer: Self-pay | Admitting: Radiology

## 2015-03-22 ENCOUNTER — Emergency Department: Payer: Medicare Other

## 2015-03-22 DIAGNOSIS — I5022 Chronic systolic (congestive) heart failure: Secondary | ICD-10-CM | POA: Diagnosis present

## 2015-03-22 DIAGNOSIS — Z951 Presence of aortocoronary bypass graft: Secondary | ICD-10-CM | POA: Diagnosis not present

## 2015-03-22 DIAGNOSIS — L89159 Pressure ulcer of sacral region, unspecified stage: Secondary | ICD-10-CM | POA: Diagnosis present

## 2015-03-22 DIAGNOSIS — R001 Bradycardia, unspecified: Secondary | ICD-10-CM | POA: Diagnosis present

## 2015-03-22 DIAGNOSIS — Z7901 Long term (current) use of anticoagulants: Secondary | ICD-10-CM | POA: Diagnosis not present

## 2015-03-22 DIAGNOSIS — L89153 Pressure ulcer of sacral region, stage 3: Secondary | ICD-10-CM | POA: Diagnosis present

## 2015-03-22 DIAGNOSIS — Z87891 Personal history of nicotine dependence: Secondary | ICD-10-CM | POA: Diagnosis not present

## 2015-03-22 DIAGNOSIS — K922 Gastrointestinal hemorrhage, unspecified: Secondary | ICD-10-CM | POA: Diagnosis present

## 2015-03-22 DIAGNOSIS — N390 Urinary tract infection, site not specified: Secondary | ICD-10-CM | POA: Diagnosis present

## 2015-03-22 DIAGNOSIS — E669 Obesity, unspecified: Secondary | ICD-10-CM | POA: Diagnosis present

## 2015-03-22 DIAGNOSIS — I482 Chronic atrial fibrillation: Secondary | ICD-10-CM | POA: Diagnosis present

## 2015-03-22 DIAGNOSIS — Z6830 Body mass index (BMI) 30.0-30.9, adult: Secondary | ICD-10-CM | POA: Diagnosis not present

## 2015-03-22 DIAGNOSIS — K921 Melena: Secondary | ICD-10-CM | POA: Diagnosis present

## 2015-03-22 DIAGNOSIS — T8351XA Infection and inflammatory reaction due to indwelling urinary catheter, initial encounter: Secondary | ICD-10-CM | POA: Diagnosis present

## 2015-03-22 DIAGNOSIS — Z794 Long term (current) use of insulin: Secondary | ICD-10-CM | POA: Diagnosis not present

## 2015-03-22 DIAGNOSIS — I959 Hypotension, unspecified: Secondary | ICD-10-CM | POA: Diagnosis present

## 2015-03-22 DIAGNOSIS — Z79899 Other long term (current) drug therapy: Secondary | ICD-10-CM | POA: Diagnosis not present

## 2015-03-22 DIAGNOSIS — E119 Type 2 diabetes mellitus without complications: Secondary | ICD-10-CM | POA: Diagnosis present

## 2015-03-22 DIAGNOSIS — R634 Abnormal weight loss: Secondary | ICD-10-CM | POA: Diagnosis present

## 2015-03-22 DIAGNOSIS — K7460 Unspecified cirrhosis of liver: Secondary | ICD-10-CM | POA: Diagnosis present

## 2015-03-22 DIAGNOSIS — I25711 Atherosclerosis of autologous vein coronary artery bypass graft(s) with angina pectoris with documented spasm: Secondary | ICD-10-CM | POA: Diagnosis not present

## 2015-03-22 DIAGNOSIS — I739 Peripheral vascular disease, unspecified: Secondary | ICD-10-CM | POA: Diagnosis not present

## 2015-03-22 DIAGNOSIS — I4892 Unspecified atrial flutter: Secondary | ICD-10-CM | POA: Diagnosis present

## 2015-03-22 DIAGNOSIS — Z89511 Acquired absence of right leg below knee: Secondary | ICD-10-CM | POA: Diagnosis not present

## 2015-03-22 DIAGNOSIS — E11621 Type 2 diabetes mellitus with foot ulcer: Secondary | ICD-10-CM | POA: Diagnosis present

## 2015-03-22 DIAGNOSIS — R32 Unspecified urinary incontinence: Secondary | ICD-10-CM | POA: Diagnosis present

## 2015-03-22 DIAGNOSIS — Y846 Urinary catheterization as the cause of abnormal reaction of the patient, or of later complication, without mention of misadventure at the time of the procedure: Secondary | ICD-10-CM | POA: Diagnosis present

## 2015-03-22 DIAGNOSIS — F419 Anxiety disorder, unspecified: Secondary | ICD-10-CM | POA: Diagnosis present

## 2015-03-22 DIAGNOSIS — Z993 Dependence on wheelchair: Secondary | ICD-10-CM | POA: Diagnosis not present

## 2015-03-22 DIAGNOSIS — R51 Headache: Secondary | ICD-10-CM | POA: Diagnosis present

## 2015-03-22 DIAGNOSIS — N170 Acute kidney failure with tubular necrosis: Secondary | ICD-10-CM | POA: Diagnosis present

## 2015-03-22 DIAGNOSIS — I252 Old myocardial infarction: Secondary | ICD-10-CM | POA: Diagnosis not present

## 2015-03-22 DIAGNOSIS — L97519 Non-pressure chronic ulcer of other part of right foot with unspecified severity: Secondary | ICD-10-CM | POA: Diagnosis present

## 2015-03-22 DIAGNOSIS — Z7982 Long term (current) use of aspirin: Secondary | ICD-10-CM | POA: Diagnosis not present

## 2015-03-22 DIAGNOSIS — I25110 Atherosclerotic heart disease of native coronary artery with unstable angina pectoris: Secondary | ICD-10-CM | POA: Diagnosis present

## 2015-03-22 DIAGNOSIS — I10 Essential (primary) hypertension: Secondary | ICD-10-CM | POA: Diagnosis present

## 2015-03-22 DIAGNOSIS — L89899 Pressure ulcer of other site, unspecified stage: Secondary | ICD-10-CM | POA: Diagnosis present

## 2015-03-22 DIAGNOSIS — K2971 Gastritis, unspecified, with bleeding: Secondary | ICD-10-CM | POA: Diagnosis not present

## 2015-03-22 DIAGNOSIS — R0781 Pleurodynia: Secondary | ICD-10-CM | POA: Diagnosis present

## 2015-03-22 DIAGNOSIS — I200 Unstable angina: Secondary | ICD-10-CM | POA: Diagnosis not present

## 2015-03-22 DIAGNOSIS — J439 Emphysema, unspecified: Secondary | ICD-10-CM | POA: Diagnosis present

## 2015-03-22 LAB — GLUCOSE, CAPILLARY
Glucose-Capillary: 198 mg/dL — ABNORMAL HIGH (ref 70–99)
Glucose-Capillary: 276 mg/dL — ABNORMAL HIGH (ref 70–99)
Glucose-Capillary: 239 mg/dL — ABNORMAL HIGH (ref 70–99)

## 2015-03-22 LAB — HEPATIC FUNCTION PANEL
ALBUMIN: 3.1 g/dL — AB (ref 3.5–5.0)
ALK PHOS: 104 U/L (ref 38–126)
ALT: 24 U/L (ref 17–63)
AST: 33 U/L (ref 15–41)
Bilirubin, Direct: 0.2 mg/dL (ref 0.1–0.5)
Indirect Bilirubin: 0.4 mg/dL (ref 0.3–0.9)
Total Bilirubin: 0.6 mg/dL (ref 0.3–1.2)
Total Protein: 6.9 g/dL (ref 6.5–8.1)

## 2015-03-22 LAB — CBC
HCT: 38.2 % — ABNORMAL LOW (ref 40.0–52.0)
HCT: 35.7 % — ABNORMAL LOW (ref 40.0–52.0)
HCT: 34.2 % — ABNORMAL LOW (ref 40.0–52.0)
HEMOGLOBIN: 11.3 g/dL — AB (ref 13.0–18.0)
Hemoglobin: 12 g/dL — ABNORMAL LOW (ref 13.0–18.0)
Hemoglobin: 10.8 g/dL — ABNORMAL LOW (ref 13.0–18.0)
MCH: 24.2 pg — ABNORMAL LOW (ref 26.0–34.0)
MCH: 24.1 pg — AB (ref 26.0–34.0)
MCH: 24.1 pg — ABNORMAL LOW (ref 26.0–34.0)
MCHC: 31.5 g/dL — ABNORMAL LOW (ref 32.0–36.0)
MCHC: 31.6 g/dL — ABNORMAL LOW (ref 32.0–36.0)
MCHC: 31.5 g/dL — ABNORMAL LOW (ref 32.0–36.0)
MCV: 76.7 fL — ABNORMAL LOW (ref 80.0–100.0)
MCV: 76.4 fL — AB (ref 80.0–100.0)
MCV: 76.5 fL — ABNORMAL LOW (ref 80.0–100.0)
Platelets: 131 10*3/uL — ABNORMAL LOW (ref 150–440)
Platelets: 127 10*3/uL — ABNORMAL LOW (ref 150–440)
Platelets: 127 10*3/uL — ABNORMAL LOW (ref 150–440)
RBC: 4.99 MIL/uL (ref 4.40–5.90)
RBC: 4.68 MIL/uL (ref 4.40–5.90)
RBC: 4.47 MIL/uL (ref 4.40–5.90)
RDW: 20.3 % — ABNORMAL HIGH (ref 11.5–14.5)
RDW: 20.1 % — ABNORMAL HIGH (ref 11.5–14.5)
RDW: 19.9 % — ABNORMAL HIGH (ref 11.5–14.5)
WBC: 6.3 10*3/uL (ref 3.8–10.6)
WBC: 6.9 10*3/uL (ref 3.8–10.6)
WBC: 7.4 10*3/uL (ref 3.8–10.6)

## 2015-03-22 LAB — URINALYSIS COMPLETE WITH MICROSCOPIC (ARMC ONLY)
Bilirubin Urine: NEGATIVE
Ketones, ur: NEGATIVE mg/dL
Nitrite: POSITIVE — AB
Protein, ur: NEGATIVE mg/dL
SQUAMOUS EPITHELIAL / LPF: NONE SEEN
Specific Gravity, Urine: 1.014 (ref 1.005–1.030)
pH: 5 (ref 5.0–8.0)

## 2015-03-22 LAB — FIBRIN DERIVATIVES D-DIMER (ARMC ONLY): Fibrin derivatives D-dimer (ARMC): 1248 — ABNORMAL HIGH (ref 0–499)

## 2015-03-22 LAB — HEMOGLOBIN A1C: Hgb A1c MFr Bld: 8.4 % — ABNORMAL HIGH (ref 4.0–6.0)

## 2015-03-22 LAB — BRAIN NATRIURETIC PEPTIDE: B Natriuretic Peptide: 797 pg/mL — ABNORMAL HIGH (ref 0.0–100.0)

## 2015-03-22 LAB — TROPONIN I: TROPONIN I: 0.03 ng/mL (ref <0.031)

## 2015-03-22 MED ORDER — IOHEXOL 350 MG/ML SOLN
100.0000 mL | Freq: Once | INTRAVENOUS | Status: AC | PRN
  Administered 2015-03-22: 100 mL via INTRAVENOUS

## 2015-03-22 MED ORDER — SIMVASTATIN 10 MG PO TABS
10.0000 mg | ORAL_TABLET | Freq: Every day | ORAL | Status: DC
  Administered 2015-03-22 – 2015-03-24 (×3): 10 mg via ORAL
  Filled 2015-03-22 (×3): qty 1

## 2015-03-22 MED ORDER — LEVOFLOXACIN IN D5W 250 MG/50ML IV SOLN
250.0000 mg | INTRAVENOUS | Status: DC
  Administered 2015-03-23 – 2015-03-25 (×3): 250 mg via INTRAVENOUS
  Filled 2015-03-22 (×4): qty 50

## 2015-03-22 MED ORDER — LORATADINE 10 MG PO TABS
10.0000 mg | ORAL_TABLET | Freq: Every day | ORAL | Status: DC
  Administered 2015-03-22 – 2015-03-25 (×4): 10 mg via ORAL
  Filled 2015-03-22 (×4): qty 1

## 2015-03-22 MED ORDER — ISOSORBIDE MONONITRATE ER 30 MG PO TB24
30.0000 mg | ORAL_TABLET | Freq: Every day | ORAL | Status: DC
  Administered 2015-03-22: 30 mg via ORAL
  Filled 2015-03-22 (×2): qty 1

## 2015-03-22 MED ORDER — CARVEDILOL 12.5 MG PO TABS
12.5000 mg | ORAL_TABLET | Freq: Two times a day (BID) | ORAL | Status: DC
  Administered 2015-03-22: 12.5 mg via ORAL
  Filled 2015-03-22: qty 1

## 2015-03-22 MED ORDER — INSULIN GLARGINE 100 UNIT/ML ~~LOC~~ SOLN
10.0000 [IU] | Freq: Every day | SUBCUTANEOUS | Status: DC
  Filled 2015-03-22: qty 0.1

## 2015-03-22 MED ORDER — SPIRONOLACTONE 25 MG PO TABS
25.0000 mg | ORAL_TABLET | Freq: Every day | ORAL | Status: DC
  Administered 2015-03-22: 25 mg via ORAL
  Filled 2015-03-22: qty 1

## 2015-03-22 MED ORDER — LEVOFLOXACIN IN D5W 750 MG/150ML IV SOLN
INTRAVENOUS | Status: AC
  Filled 2015-03-22: qty 150

## 2015-03-22 MED ORDER — INSULIN ASPART 100 UNIT/ML ~~LOC~~ SOLN
0.0000 [IU] | Freq: Three times a day (TID) | SUBCUTANEOUS | Status: DC
  Administered 2015-03-22: 8 [IU] via SUBCUTANEOUS
  Administered 2015-03-23: 3 [IU] via SUBCUTANEOUS
  Administered 2015-03-23: 5 [IU] via SUBCUTANEOUS
  Administered 2015-03-23 – 2015-03-24 (×2): 3 [IU] via SUBCUTANEOUS
  Administered 2015-03-24 (×2): 8 [IU] via SUBCUTANEOUS
  Administered 2015-03-25: 5 [IU] via SUBCUTANEOUS
  Filled 2015-03-22: qty 8
  Filled 2015-03-22: qty 20
  Filled 2015-03-22 (×2): qty 3
  Filled 2015-03-22: qty 8
  Filled 2015-03-22: qty 5
  Filled 2015-03-22: qty 3
  Filled 2015-03-22: qty 5

## 2015-03-22 MED ORDER — ASPIRIN EC 81 MG PO TBEC
81.0000 mg | DELAYED_RELEASE_TABLET | Freq: Every day | ORAL | Status: DC
  Administered 2015-03-22: 81 mg via ORAL
  Filled 2015-03-22: qty 1

## 2015-03-22 MED ORDER — APIXABAN 5 MG PO TABS
5.0000 mg | ORAL_TABLET | Freq: Two times a day (BID) | ORAL | Status: DC
  Filled 2015-03-22: qty 1

## 2015-03-22 MED ORDER — TRAMADOL HCL 50 MG PO TABS
50.0000 mg | ORAL_TABLET | Freq: Four times a day (QID) | ORAL | Status: DC | PRN
  Administered 2015-03-22 – 2015-03-25 (×4): 50 mg via ORAL
  Filled 2015-03-22 (×4): qty 1

## 2015-03-22 MED ORDER — LEVOFLOXACIN IN D5W 750 MG/150ML IV SOLN
750.0000 mg | Freq: Once | INTRAVENOUS | Status: AC
  Administered 2015-03-22: 750 mg via INTRAVENOUS

## 2015-03-22 MED ORDER — NITROGLYCERIN 0.4 MG SL SUBL: 0.4000 mg | SUBLINGUAL_TABLET | SUBLINGUAL | Status: DC | PRN

## 2015-03-22 MED ORDER — GABAPENTIN 300 MG PO CAPS
300.0000 mg | ORAL_CAPSULE | Freq: Three times a day (TID) | ORAL | Status: DC
  Administered 2015-03-22 – 2015-03-25 (×10): 300 mg via ORAL
  Filled 2015-03-22 (×10): qty 1

## 2015-03-22 MED ORDER — INSULIN DETEMIR 100 UNIT/ML ~~LOC~~ SOLN
20.0000 [IU] | Freq: Every day | SUBCUTANEOUS | Status: DC
  Administered 2015-03-22 – 2015-03-23 (×2): 20 [IU] via SUBCUTANEOUS
  Filled 2015-03-22 (×3): qty 0.2

## 2015-03-22 MED ORDER — LOSARTAN POTASSIUM 50 MG PO TABS
100.0000 mg | ORAL_TABLET | Freq: Every day | ORAL | Status: DC
  Administered 2015-03-22: 100 mg via ORAL
  Filled 2015-03-22: qty 2

## 2015-03-22 MED ORDER — SERTRALINE HCL 25 MG PO TABS
25.0000 mg | ORAL_TABLET | Freq: Every day | ORAL | Status: DC
  Administered 2015-03-22 – 2015-03-25 (×4): 25 mg via ORAL
  Filled 2015-03-22 (×4): qty 1

## 2015-03-22 MED ORDER — SODIUM CHLORIDE 0.9 % IV BOLUS (SEPSIS)
500.0000 mL | Freq: Once | INTRAVENOUS | Status: AC
  Administered 2015-03-22: 500 mL via INTRAVENOUS

## 2015-03-22 NOTE — ED Notes (Signed)
Dr. Zenda AlpersWebster notified of drop in pt's systolic blood pressure of low 100s.

## 2015-03-22 NOTE — Progress Notes (Signed)
58yo Mr Hendricks MiloMichael Searls was admitted 03/21/15 from [facility] per c/o chest pain and rectal bleeding. He has a chronic foley catheter, a right BKA, a diabetic draining wound on his left calf requiring daily dressing changes, chronic foot ulcers, an a new sacral decubitis. Mr Alfonse RasHam is wheelchair bound. He currently takes Eliquis per A-Fib. Mr Alfonse RasHam anticipates returning to [facility] after this hospital discharge. No current home health providers but has used [facility] in the past. Has a friend, Luster LandsbergRenee ph: [phone] who assists Mr Alfonse RasHam with personal and legal business. PCP is Dr Lorin PicketScott.

## 2015-03-22 NOTE — ED Notes (Signed)
Patient transported to CT 

## 2015-03-22 NOTE — ED Provider Notes (Signed)
[facility] Emergency Department Provider Note  ____________________________________________  Time seen: Approximately 2311  I have reviewed the triage vital signs and the nursing notes.   HISTORY  Chief Complaint Chest Pain    HPI [name] is a 59 y.o. male who comes in tonight with chest pain. He was reports that the pain started this afternoon at approximately 1500. He reports that he was watching television when the pain started. He reports that the pain has gotten worse as the day has progressed. He reports that it is a stabbing pain that is worse when he takes a deep breath in. The patient reports that he feels short of breath. He does not wear oxygen normally at home. The patient reports that he felt weak and lightheaded. He reports that he has had this pain multiple times in the past and typically sees Dr. Mahtowa Sink for. The patient reports that he was nauseous with no vomiting but he did have some mild sweats. When the nurses were checking the patient's they noticed that he had some blood coming from his rectum as well as blood in his stool. The patient reports that he is unsure when this bleeding started does report that he was unconscious at his facility for 2 days. Patient reports that he is here for further evaluation of his chest pain. He denies anxiety or depression. He did not take anything for the pain at home except for his normal medications.   Past Medical History  Diagnosis Date  . HTN (hypertension)   . 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 Anxiety   . Headache(784.0)   . NSTEMI (non-ST elevated myocardial infarction) 10/17/2011  . Diabetes mellitus   . Pulmonary emphysema   . Obesity   . Paroxysmal atrial flutter 10/15/2011  . CAD (coronary artery disease), autologous vein bypass graft     s/p CABG in 80's or 90's, multiple caths/PCI. Presented in June of 2014  with non-ST elevation myocardial infarction. Cardiac catheterization done by Dr. Fransico Eros at [facility] showed severe three-vessel coronary artery disease (nondominant RCA )with patent LIMA to LAD, occluded SVG to OM1(new) and occluded SVG to PDA. There was significant ostial and distal left main stenosis. Attempted left main/LCX PCI  . MI (myocardial infarction)   . Chronic systolic heart failure     Ejection fraction 35%  . Broken femur   . Chronic indwelling Foley catheter   . CHF (congestive heart failure)     Patient Active Problem List   Diagnosis Date Noted  . Chronic indwelling Foley catheter   . Atrial fibrillation 07/24/2014  . Hyperlipidemia 01/24/2014  . Chronic systolic heart failure   . Paroxysmal atrial flutter 05/20/2013  . S/P BKA (below knee amputation) unilateral 11/15/2011  . Weakness generalized 11/13/2011  . NSTEMI (non-ST elevated myocardial infarction) 10/17/2011  . Soft tissue infection of foot 10/15/2011  . Atrial flutter with rapid ventricular response 10/15/2011  . CAD (coronary artery disease), autologous vein bypass graft   . HTN (hypertension)   . Diabetes mellitus   . Diabetic foot ulcers 10/14/2011    Past Surgical History  Procedure Laterality Date  . Coronary artery bypass graft    . No past surgeries    . Amputation  10/17/2011    Procedure: AMPUTATION RAY;  Surgeon: Cammy Copa;  Location: [facility] OR;  Service: Orthopedics;  Laterality: Right;  First and Second Ray Amputation  .  Amputation  11/01/2011    Procedure: AMPUTATION BELOW KNEE;  Surgeon: Nadara MustardMarcus V Duda, MD;  Location: [facility] OR;  Service: Orthopedics;  Laterality: Right;  Right Below Knee Amputation  . Rt bka    . Cardiac catheterization  09/2006    [facility];EF 60%  . Cardiac catheterization  05/30/13    [facility]: Occluded SVG to RCA and SVG to OM (both chronic) , patent LIMA to LAD which gives collaterals to LCx and RCA. Subtotal occlusion of the native left circumflex at the site [facility]. Ejection fraction 55%    Current Outpatient Rx  Name  Route  Sig  Dispense  Refill  . apixaban (ELIQUIS) 5 MG TABS tablet   Oral   Take 1 tablet (5 mg total) by mouth 2 (two) times daily.   60 tablet   6   . aspirin EC 81 MG tablet   Oral   Take 1 tablet (81 mg total) by mouth daily.   90 tablet   3   . carvedilol (COREG) 12.5 MG tablet   Oral   Take 1 tablet (12.5 mg total) by mouth 2 (two) times daily.   60 tablet   6   . cetirizine (ZYRTEC) 10 MG tablet   Oral   Take 10 mg by mouth daily.         Marland Kitchen. gabapentin (NEURONTIN) 300 MG capsule   Oral   Take 300 mg by mouth 3 (three) times daily.         . insulin detemir (LEVEMIR) 100 UNIT/ML injection   Subcutaneous   Inject 20 Units into the skin daily.          Marland Kitchen. losartan (COZAAR) 100 MG tablet      TAKE ONE TABLET BY MOUTH ONCE DAILY   30 tablet   3   . nitroGLYCERIN (NITROSTAT) 0.4 MG SL tablet   Sublingual   Place 1 tablet (0.4 mg total) under the tongue every 5 (five) minutes as needed for chest pain.   25 tablet   3   . sertraline (ZOLOFT) 25 MG tablet   Oral   Take 25 mg by mouth daily.         . simvastatin (ZOCOR) 10 MG tablet   Oral   Take 10 mg by mouth at bedtime.         Marland Kitchen. spironolactone (ALDACTONE) 25 MG tablet   Oral   Take 25 mg by mouth daily.         . traMADol (ULTRAM) 50 MG tablet   Oral   Take 50 mg by mouth every 6 (six) hours as needed.            Allergies Review of patient's allergies indicates no known allergies.  Family History  Problem Relation Age of Onset  . Alzheimer's disease Father   . Melanoma Father   . Benign prostatic hyperplasia Father   . Coronary artery disease Father     s/p CABG  . Heart attack Brother 57    MI  . Coronary artery disease Brother     s/p CABG  . Lymphoma Sister     non Hodgkin's   . Aortic aneurysm Mother   . Anemia Mother     Social History History  Substance Use Topics  . Smoking status: Former Smoker --  0.50 packs/day for 35 years    Types: Cigarettes    Quit date: 07/02/2001  . Smokeless tobacco: Never Used  .  Alcohol Use: No    Review of Systems Constitutional: No fever/chills Eyes: No visual changes. ENT: No sore throat. Cardiovascular:  chest pain. Respiratory: shortness of breath. Gastrointestinal: No abdominal pain.  Positive for nausea, no vomiting.  No diarrhea.  Blood in stool Genitourinary: Negative for dysuria. Musculoskeletal: Negative for back pain. Skin: Negative for rash. Neurological: Negative for headaches, focal weakness or numbness. Positive for lightheadedness Psychiatric:Denies anxiety or depression 10-point ROS otherwise negative.  ____________________________________________   PHYSICAL EXAM:  VITAL SIGNS: ED Triage Vitals  Enc Vitals Group     BP 03/21/15 2200 142/75 mmHg     Pulse Rate 03/21/15 2200 81     Resp 03/21/15 2200 14     Temp 03/21/15 2208 98.6 F (37 C)     Temp Source 03/21/15 2208 Oral     SpO2 03/21/15 2200 93 %     Weight 03/21/15 2208 197 lb (89.359 kg)     Height 03/21/15 2208  (1.702 m)     Head Cir --      Peak Flow --      Pain Score 03/21/15 2209 5     Pain Loc --      Pain Edu? --      Excl. in GC? --     Constitutional: Alert and oriented. Well appearing and in no acute distress. Eyes: Conjunctivae are normal. PERRL. EOMI. Head: Atraumatic. Nose: No congestion/rhinnorhea. Mouth/Throat: Mucous membranes are moist.  Oropharynx non-erythematous. Hematological/Lymphatic/Immunilogical: No cervical lymphadenopathy. Cardiovascular: Normal rate, irregular rhythm. Grossly normal heart sounds.  Good peripheral circulation. Respiratory: Normal respiratory effort.  No retractions. Lungs CTAB. Gastrointestinal: Soft and nontender. No distention. No abdominal bruits. No CVA tenderness. Genitourinary: Foley catheter in place, dark red blood in patient's rectum Musculoskeletal: Right BKA, ulceration on left lower  extremity Neurologic:  Normal speech and language. No gross focal neurologic deficits are appreciated. Speech is normal. Skin:  Ulcers noted on left lower extremity Psychiatric: Mood and affect are normal. Speech and behavior are normal.  ____________________________________________   LABS (all labs ordered are listed, but only abnormal results are displayed)  Labs Reviewed  BASIC METABOLIC PANEL - Abnormal; Notable for the following:    Chloride 100 (*)    Glucose, Bld 342 (*)    BUN 30 (*)    All other components within normal limits  CBC - Abnormal; Notable for the following:    Hemoglobin 11.6 (*)    HCT 36.6 (*)    MCV 75.7 (*)    MCH 24.0 (*)    MCHC 31.7 (*)    RDW 19.7 (*)    Platelets 145 (*)    All other components within normal limits  TROPONIN I - Abnormal; Notable for the following:    Troponin I 0.05 (*)    All other components within normal limits  URINALYSIS COMPLETEWITH MICROSCOPIC ([facility])  - Abnormal; Notable for the following:    Color, Urine YELLOW (*)    APPearance CLOUDY (*)    Glucose, UA >500 (*)    Hgb urine dipstick 2+ (*)    Nitrite POSITIVE (*)    Leukocytes, UA 1+ (*)    Bacteria, UA FEW (*)    All other components within normal limits  BRAIN NATRIURETIC PEPTIDE - Abnormal; Notable for the following:    B Natriuretic Peptide 797.0 (*)    All other components within normal limits  HEPATIC FUNCTION PANEL - Abnormal; Notable for the following:    Albumin 3.1 (*)  All other components within normal limits  FIBRIN DERIVATIVES D-DIMER [facility]) - Abnormal; Notable for the following:    Fibrin derivatives D-dimer [facility]) 1248 (*)    All other components within normal limits  TROPONIN I   ____________________________________________  EKG  ED ECG REPORT   Date: 03/22/2015  EKG Time: 2156  Rate: 82  Rhythm: atrial fibrillation, rate 82  Axis: Normal axis  Intervals:none  ST&T Change: ST depressions in V2 and V3 with flipped T waves V4 and  V5 unchanged compared to 01/04/2015  ____________________________________________  RADIOLOGY  Chest x-ray shows cardiac enlargement with suggestion of mild developing pulmonary vascular congestion  CT angiography shows no evidence of significant pulmonary embolus, cardiac enlargement with passive hepatic congestion, mediastinal lymphadenopathy, atelectasis in lung bases ____________________________________________   PROCEDURES  Procedure(s) performed: None  Critical Care performed: No  ____________________________________________   INITIAL IMPRESSION / ASSESSMENT AND PLAN / ED COURSE  Pertinent labs & imaging results that were available during my care of the patient were reviewed by me and considered in my medical decision making (see chart for details).  This is a [age] year old male who comes in today with chest pain. The patient does have a significant cardiac history as well as some dark red blood from his rectum. The patient is on a blood thinner. The patient did receive a CT angiogram of his chest as he complained of pleuritic chest pain and had an elevated d-dimer. The patient's chest pain workup at this time is negative but I will admit the patient to the hospital as he is having continued bleeding from his rectum and blood pressures in the 90s. The patient received some gentle hydration along with an admission to the hospital. The patient also does appear to have a urinary tract infection which we will treat with antibiotics in the emergency department. I have discussed this patient with Dr. Sheryle Hailiamond and he accepts the patient to the hospitalist service. ____________________________________________   FINAL CLINICAL IMPRESSION(S) / ED DIAGNOSES  Final diagnoses:  Chronic indwelling Foley catheter  Pleuritic chest pain      Rebecka ApleyAllison P Keniya Schlotterbeck, MD 03/24/15 [phone]

## 2015-03-22 NOTE — Progress Notes (Signed)
MD was paged for BP of 91/48, with abdominal pain, no other symptoms. MD wants to hold coreg & it is okay to give tramadol for abdominal pain, MD wants to monitor BP, no bolus for now.

## 2015-03-22 NOTE — ED Notes (Signed)
Pt continues to sleep, arousable to verbal stimuli by rn. Pt denies needs at this time and declines offer for further warm blanket.

## 2015-03-22 NOTE — Progress Notes (Signed)
[facility] [place] [facility]   PATIENT NAME: Tyler MiloMichael Frank    MR#:  [id number]  DATE OF BIRTH:  November 02, 1956  SUBJECTIVE:  Chest pain resolved. Does have lower abdominal pain, crampy.  REVIEW OF SYSTEMS:  Review of Systems  Constitutional: Negative for fever.  Respiratory: Negative for cough and shortness of breath.   Cardiovascular: Negative for chest pain, palpitations, orthopnea and leg swelling.  Gastrointestinal: Positive for heartburn, abdominal pain, diarrhea and blood in stool.  Genitourinary: Negative for dysuria.  Skin: Negative for rash.     DRUG ALLERGIES:  No Known Allergies  VITALS:  Blood pressure 117/72, pulse 80, temperature 98.4 F (36.9 C), temperature source Oral, resp. rate 16, height 5\' 7"  (1.702 m), weight 89.359 kg (197 lb), SpO2 100 %.  PHYSICAL EXAMINATION:  Physical Exam  Constitutional: He is oriented to person, place, and time. He appears well-developed and well-nourished.  HENT:  Head: Normocephalic and atraumatic.  Eyes: EOM are normal. Pupils are equal, round, and reactive to light.  Neck: No JVD present. No thyromegaly present.  Cardiovascular: Normal rate and regular rhythm.   No murmur heard. Pulmonary/Chest: Effort normal and breath sounds normal.  Abdominal: Soft. He exhibits no distension. There is tenderness. There is no guarding.  Hypoactive BS, ttp in B lower quadrants  Neurological: He is oriented to person, place, and time.  Skin: Skin is warm and dry.  Psychiatric: He has a normal mood and affect.     LABORATORY PANEL:   CBC  Recent Labs Lab 03/22/15 0911  WBC 6.3  HGB 12.0*  HCT 38.2*  PLT 131*   ------------------------------------------------------------------------------------------------------------------  Chemistries   Recent Labs Lab 03/21/15 2200  NA 137  K 3.9  CL 100*  CO2 28  GLUCOSE 342*  BUN 30*  CREATININE 1.09  CALCIUM 9.0  AST 33  ALT 24  ALKPHOS 104   BILITOT 0.6   ------------------------------------------------------------------------------------------------------------------  Cardiac Enzymes  Recent Labs Lab 03/22/15 0230  TROPONINI 0.03   ------------------------------------------------------------------------------------------------------------------  RADIOLOGY:  Ct Angio Chest Pe W/cm &/or Wo Cm  03/22/2015   CLINICAL DATA:  Left-sided chest pain and shortness of breath starting at 3 p.m. yesterday.  EXAM: CT ANGIOGRAPHY CHEST WITH CONTRAST  TECHNIQUE: Multidetector CT imaging of the chest was performed using the standard protocol during bolus administration of intravenous contrast. Multiplanar CT image reconstructions and MIPs were obtained to evaluate the vascular anatomy.  CONTRAST:  100mL OMNIPAQUE IOHEXOL 350 MG/ML SOLN  COMPARISON:  10/15/2011  FINDINGS: Technically adequate study with good opacification of the central and segmental pulmonary arteries. No focal filling defects are identified. No evidence of significant pulmonary embolus.  Cardiac enlargement. Normal caliber thoracic aorta. Aortic calcification. Coronary artery calcification with postoperative changes consistent with bypass grafting. Passive congestion of contrast material into the liver may indicate right heart failure. Mediastinal lymphadenopathy with enlarged right pretracheal node measuring up to about 2.2 cm short axis dimension. This is nonspecific but does represent an increase since the previous study. Esophagus is decompressed.  Evaluation of lungs is limited due to respiratory motion artifact. There is evidence of atelectasis in the lung bases. No discrete infiltration. Mild interstitial changes are suggested which may indicate edema. Airways are patent. No pleural effusions. No pneumothorax.  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 Cholelithiasis with small stones in the gallbladder. Prominent  calcification in the superior mesenteric artery and branches of the celiac axis.  Review of the  MIP images confirms the above findings.  IMPRESSION: No evidence of significant pulmonary embolus. Cardiac enlargement with passive hepatic congestion and changes of probable hepatic cirrhosis. Mediastinal lymphadenopathy, increasing since previous study. Atelectasis in the lung bases.   Electronically Signed   By: Burman NievesWilliam  Stevens M.D.   On: 03/22/2015 04:28   Dg Chest Port 1 View  03/21/2015   CLINICAL DATA:  Chest pain and shortness of breath since this afternoon. No cough.  EXAM: PORTABLE CHEST - 1 VIEW  COMPARISON:  02/14/2015  FINDINGS: Postoperative changes in the mediastinum. Shallow inspiration. Cardiac enlargement with mild prominence of pulmonary vascular shadow suggesting mild vascular congestion. No focal airspace disease or consolidation. No blunting of costophrenic angles. No pneumothorax. Calcified and tortuous aorta.  IMPRESSION: Cardiac enlargement with suggestion of mild developing pulmonary vascular congestion   Electronically Signed   By: Burman NievesWilliam  Stevens M.D.   On: 03/21/2015 23:03    EKG:   Orders placed or performed during the hospital encounter of 03/21/15  . ED EKG  . ED EKG    ASSESSMENT AND PLAN:   Assessment/Plan This is a 59 year old male admitted for unstable angina, GI bleed, and urinary tract infection.  1. Unstable angina:  - appreciate cardiology consultation, currently asymptomatic - have stopped eliquis due to gi bleeding - continue medical management  2. GI bleed:  - appriciate gi consultation - painless lower GI bleeding, he is not sure if he has had a colonoscopy - hold eliquis, monitor cbc  3. Urinary tract infection:  - continue lvaquin - await cultures  4. Diabetes mellitus type 2:  - levimir and SSI  5. Atrial fibrillation:  - rate is controlled - hold  Eliquis  6. Chronic systolic heart failure: No exacerbation at this time. Continue  carvedilol and spironolactone 7. Hypertension: soft, hold valsartan 8. DVT prophylaxis: SCD's  in the setting of GI bleeding 9. GI prophylaxis: Proton pump inhibitor     All the records are reviewed and case discussed with Care Management/Social Workerr. Management plans discussed with the patient, family and they are in agreement.  CODE STATUS: full  TOTAL TIME TAKING CARE OF THIS PATIENT: 35 minutes.   POSSIBLE D/C IN 2-3 DAYS, DEPENDING ON CLINICAL CONDITION.   Elby ShowersWALSH, Nomi Rudnicki M.D on 03/22/2015 at 3:45 PM  Between 7am to 6pm - Pager - [phone]  After 6pm go to [email or web address] - password [email or web address] [facility]  [facility]  Office  [phone]  CC: Primary care physician; Dortha KernBLISS, LAURA K, MD

## 2015-03-22 NOTE — ED Notes (Signed)
Pt sleeping. Dr. Zenda AlpersWebster notified of pt's continued blood pressure systolic of mid 90s to low 100s. No new orders received. Pt with 2+ radial pulses. Skin color unchanged, remains pale, resps unlabored. No new bleeding noted from rectum.

## 2015-03-22 NOTE — Care Management Note (Signed)
Case Management Note  Patient Details  Name: [name] MRN: [id number] Date of Birth: 1956-03-28  Subjective/Objective:                    Action/Plan:   Expected Discharge Date:                  Expected Discharge Plan:     In-House Referral:     Discharge planning Services     Post Acute Care Choice:    Choice offered to:     DME Arranged:    DME Agency:     HH Arranged:    HH Agency:     Status of Service:     Medicare Important Message Given:   YES Date Medicare IM Given:   03/22/15 Medicare IM give by:   Dameir Gentzler, CM Date Additional Medicare IM Given:    Additional Medicare Important Message give by:     If discussed at Long Length of Stay Meetings, dates discussed:    Additional Comments:  [place] A, RN 03/22/2015, 11:01 AM

## 2015-03-22 NOTE — ED Notes (Signed)
fsbs of 320.

## 2015-03-22 NOTE — ED Notes (Signed)
No complaints.  Pt resting quietly.  Levaquin done infusing.  Updated on bed status.

## 2015-03-22 NOTE — Progress Notes (Signed)
Pt had quiet day. Alert orineted, painfree. No further stools since admission. Foam applied to abrasion on left leg and to p.u. On coccyx. Appetite good.

## 2015-03-22 NOTE — H&P (Signed)
[name] is an 59 y.o. male.   Chief Complaint: Chest pain HPI: The patient presents to the emergency department complaining of chest pain that began over his left chest. It began at rest and radiated to his left arm. He denied any nausea or vomiting but admitted to some shortness of breath and lightheadedness. At the time of my interview the patient states that he feels better and the chest pain is resolved. His initial assessment by the emergency department staff revealed that he has a wound on his sacrum but also that he has freely oozing dark red blood mixed with some melena from his rectum. The patient states this is painless and that he was unaware he had any bleeding at this time although he admits he has had some blood in his stool previously. CT angiogram of the chest ruled out pulmonary embolism as a cause of pain. Further evaluation in the emergency department revealed urinary tract infection due to indwelling Foley catheter. Due to his chest pain, history of coronary artery disease, GI bleeding, and urinary tract infection the emergency department staff called for admission to the hospital.   Past Medical History  Diagnosis Date  . HTN (hypertension)   . 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 Anxiety   . Headache(784.0)   . NSTEMI (non-ST elevated myocardial infarction) 10/17/2011  . Diabetes mellitus   . Pulmonary emphysema   . Obesity   . Paroxysmal atrial flutter 10/15/2011  . CAD (coronary artery disease), autologous vein bypass graft     s/p CABG in 80's or 90's, multiple caths/PCI. Presented in June of 2014 with non-ST elevation myocardial infarction. Cardiac catheterization done by Dr. Tobe Sos at [facility] showed severe three-vessel coronary artery disease (nondominant RCA )with patent LIMA to LAD, occluded SVG to OM1(new) and occluded SVG to PDA. There was significant ostial and distal left main stenosis.  Attempted left main/LCX PCI  . MI (myocardial infarction)   . Chronic systolic heart failure     Ejection fraction 35%  . Broken femur   . Chronic indwelling Foley catheter   . CHF (congestive heart failure)     Past Surgical History  Procedure Laterality Date  . Coronary artery bypass graft    . No past surgeries    . Amputation  10/17/2011    Procedure: AMPUTATION RAY;  Surgeon: Meredith Pel;  Location: [place];  Service: Orthopedics;  Laterality: Right;  First and Second Ray Amputation  . Amputation  11/01/2011    Procedure: AMPUTATION BELOW KNEE;  Surgeon: Newt Minion, MD;  Location: [place];  Service: Orthopedics;  Laterality: Right;  Right Below Knee Amputation  . Rt bka    . Cardiac catheterization  09/2006    [facility];EF 60%  . Cardiac catheterization  05/30/13    [facility]: Occluded SVG to RCA and SVG to OM (both chronic) , patent LIMA to LAD which gives collaterals to LCx and RCA. Subtotal occlusion of the native left circumflex at the site of PCI done at [facility]. Ejection fraction 55%    Family History  Problem Relation Age of Onset  . Alzheimer's disease Father   . Melanoma Father   . Benign prostatic hyperplasia Father   . Coronary artery disease Father     s/p CABG  . Heart attack Brother 30    MI  . Coronary artery disease Brother     s/p CABG  .  Lymphoma Sister     non Hodgkin's   . Aortic aneurysm Mother   . Anemia Mother    Social History:  reports that he quit smoking about 13 years ago. His smoking use included Cigarettes. He has a 17.5 pack-year smoking history. He has never used smokeless tobacco. He reports that he does not drink alcohol or use illicit drugs.  Allergies: No Known Allergies   (Not in a hospital admission)  Results for orders placed or performed during the hospital encounter of 03/21/15 (from the past 48 hour(s))  Basic metabolic panel     Status: Abnormal   Collection Time: 03/21/15 10:00 PM  Result Value Ref Range   Sodium 137 135 - 145  mmol/L   Potassium 3.9 3.5 - 5.1 mmol/L   Chloride 100 (L) 101 - 111 mmol/L   CO2 28 22 - 32 mmol/L   Glucose, Bld 342 (H) 65 - 99 mg/dL   BUN 30 (H) 6 - 20 mg/dL   Creatinine, Ser 1.09 0.61 - 1.24 mg/dL   Calcium 9.0 8.9 - 10.3 mg/dL   GFR calc non Af Amer >60 >60 mL/min   GFR calc Af Amer >60 >60 mL/min    Comment: (NOTE) The eGFR has been calculated using the CKD EPI equation. This calculation has not been validated in all clinical situations. eGFR's persistently <60 mL/min signify possible Chronic Kidney Disease.    Anion gap 9 5 - 15  CBC     Status: Abnormal   Collection Time: 03/21/15 10:00 PM  Result Value Ref Range   WBC 7.7 3.8 - 10.6 K/uL   RBC 4.84 4.40 - 5.90 MIL/uL   Hemoglobin 11.6 (L) 13.0 - 18.0 g/dL   HCT 36.6 (L) 40.0 - 52.0 %   MCV 75.7 (L) 80.0 - 100.0 fL   MCH 24.0 (L) 26.0 - 34.0 pg   MCHC 31.7 (L) 32.0 - 36.0 g/dL   RDW 19.7 (H) 11.5 - 14.5 %   Platelets 145 (L) 150 - 440 K/uL  Troponin I     Status: Abnormal   Collection Time: 03/21/15 10:00 PM  Result Value Ref Range   Troponin I 0.05 (H) <0.031 ng/mL    Comment: READ BACK AND VERIFIED APRIL BRUMGARD AT 2302 03/21/15.PMH        PERSISTENTLY INCREASED TROPONIN VALUES IN THE RANGE OF 0.04-0.49 ng/mL CAN BE SEEN IN:       -UNSTABLE ANGINA       -CONGESTIVE HEART FAILURE       -MYOCARDITIS       -CHEST TRAUMA       -ARRYHTHMIAS       -LATE PRESENTING MYOCARDIAL INFARCTION       -COPD   CLINICAL FOLLOW-UP RECOMMENDED.   Hepatic function panel     Status: Abnormal   Collection Time: 03/21/15 10:00 PM  Result Value Ref Range   Total Protein 6.9 6.5 - 8.1 g/dL   Albumin 3.1 (L) 3.5 - 5.0 g/dL   AST 33 15 - 41 U/L   ALT 24 17 - 63 U/L   Alkaline Phosphatase 104 38 - 126 U/L   Total Bilirubin 0.6 0.3 - 1.2 mg/dL   Bilirubin, Direct 0.2 0.1 - 0.5 mg/dL   Indirect Bilirubin 0.4 0.3 - 0.9 mg/dL  Fibrin derivatives [facility])     Status: Abnormal   Collection Time: 03/21/15 10:00 PM  Result  Value Ref Range   Fibrin derivatives D-dimer ([facility]) 1248 (H) 0 - 499  Brain natriuretic peptide     Status: Abnormal   Collection Time: 03/21/15 10:10 PM  Result Value Ref Range   B Natriuretic Peptide 797.0 (H) 0.0 - 100.0 pg/mL  Urinalysis complete, with microscopic [facility])     Status: Abnormal   Collection Time: 03/22/15  2:30 AM  Result Value Ref Range   Color, Urine YELLOW (A) YELLOW   APPearance CLOUDY (A) CLEAR   Glucose, UA >500 (A) NEGATIVE mg/dL   Bilirubin Urine NEGATIVE NEGATIVE   Ketones, ur NEGATIVE NEGATIVE mg/dL   Specific Gravity, Urine 1.014 1.005 - 1.030   Hgb urine dipstick 2+ (A) NEGATIVE   pH 5.0 5.0 - 8.0   Protein, ur NEGATIVE NEGATIVE mg/dL   Nitrite POSITIVE (A) NEGATIVE   Leukocytes, UA 1+ (A) NEGATIVE   RBC / HPF 0-5 0 - 5 RBC/hpf   WBC, UA 6-30 0 - 5 WBC/hpf   Bacteria, UA FEW (A) NONE SEEN   Squamous Epithelial / LPF NONE SEEN NONE SEEN   Mucous PRESENT   Troponin I     Status: None   Collection Time: 03/22/15  2:30 AM  Result Value Ref Range   Troponin I 0.03 <0.031 ng/mL    Comment:        NO INDICATION OF MYOCARDIAL INJURY.   Glucose, capillary     Status: Abnormal   Collection Time: 03/22/15  6:10 AM  Result Value Ref Range   Glucose-Capillary 198 (H) 70 - 99 mg/dL   Ct Angio Chest Pe W/cm &/or Wo Cm  03/22/2015   CLINICAL DATA:  Left-sided chest pain and shortness of breath starting at 3 p.m. yesterday.  EXAM: CT ANGIOGRAPHY CHEST WITH CONTRAST  TECHNIQUE: Multidetector CT imaging of the chest was performed using the standard protocol during bolus administration of intravenous contrast. Multiplanar CT image reconstructions and MIPs were obtained to evaluate the vascular anatomy.  CONTRAST:  172m OMNIPAQUE IOHEXOL 350 MG/ML SOLN  COMPARISON:  10/15/2011  FINDINGS: Technically adequate study with good opacification of the central and segmental pulmonary arteries. No focal filling defects are identified. No evidence of significant pulmonary  embolus.  Cardiac enlargement. Normal caliber thoracic aorta. Aortic calcification. Coronary artery calcification with postoperative changes consistent with bypass grafting. Passive congestion of contrast material into the liver may indicate right heart failure. Mediastinal lymphadenopathy with enlarged right pretracheal node measuring up to about 2.2 cm short axis dimension. This is nonspecific but does represent an increase since the previous study. Esophagus is decompressed.  Evaluation of lungs is limited due to respiratory motion artifact. There is evidence of atelectasis in the lung bases. No discrete infiltration. Mild interstitial changes are suggested which may indicate edema. Airways are patent. No pleural effusions. No pneumothorax.  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 Cholelithiasis with small stones in the gallbladder. Prominent calcification in the superior mesenteric artery and branches of the celiac axis.  Review of the MIP images confirms the above findings.  IMPRESSION: No evidence of significant pulmonary embolus. Cardiac enlargement with passive hepatic congestion and changes of probable hepatic cirrhosis. Mediastinal lymphadenopathy, increasing since previous study. Atelectasis in the lung bases.   Electronically Signed   By: WLucienne CapersM.D.   On: 03/22/2015 04:28   Dg Chest Port 1 View  03/21/2015   CLINICAL DATA:  Chest pain and shortness of breath since this afternoon. No cough.  EXAM: PORTABLE CHEST - 1 VIEW  COMPARISON:  02/14/2015  FINDINGS: Postoperative changes in  the mediastinum. Shallow inspiration. Cardiac enlargement with mild prominence of pulmonary vascular shadow suggesting mild vascular congestion. No focal airspace disease or consolidation. No blunting of costophrenic angles. No pneumothorax. Calcified and tortuous aorta.  IMPRESSION: Cardiac enlargement with suggestion of mild developing pulmonary vascular  congestion   Electronically Signed   By: Lucienne Capers M.D.   On: 03/21/2015 23:03    Review of Systems  Constitutional: Negative for fever and chills.  HENT: Negative for sore throat and tinnitus.   Eyes: Negative for blurred vision and redness.  Respiratory: Negative for cough and shortness of breath.   Cardiovascular: Negative for chest pain, palpitations, orthopnea and PND.  Gastrointestinal: Positive for melena. Negative for nausea, vomiting, abdominal pain and diarrhea.  Genitourinary: Negative for dysuria, urgency and frequency.  Musculoskeletal: Negative for myalgias and joint pain.  Skin: Negative for rash.       No lesions  Neurological: Negative for speech change, focal weakness and weakness.  Endo/Heme/Allergies: Does not bruise/bleed easily.       No temperature intolerance  Psychiatric/Behavioral: Negative for depression and suicidal ideas.    Blood pressure 100/60, pulse 81, temperature 98.5 F (36.9 C), temperature source Oral, resp. rate 12, height 5' 7"  (1.702 m), weight 89.359 kg (197 lb), SpO2 95 %. Physical Exam  Nursing note and vitals reviewed. Constitutional: He is oriented to person, place, and time. He appears well-developed and well-nourished. No distress.  HENT:  Head: Normocephalic and atraumatic.  Eyes: Conjunctivae and EOM are normal. Pupils are equal, round, and reactive to light. No scleral icterus.  Neck: Normal range of motion. No thyromegaly present.  Cardiovascular: Normal rate, regular rhythm and normal heart sounds.  Exam reveals no gallop and no friction rub.   No murmur heard. Respiratory: Effort normal and breath sounds normal.  GI: Soft. Bowel sounds are normal. He exhibits no distension. There is no tenderness.  Genitourinary: Guaiac positive stool.  Indwelling Foley  Musculoskeletal: Normal range of motion. He exhibits no edema.       Legs: Lymphadenopathy:    He has no cervical adenopathy.  Neurological: He is alert and oriented  to person, place, and time. A cranial nerve deficit is present.  R side facial droop  Skin:        Assessment/Plan This is a 59 year old male admitted for unstable angina, GI bleed, and urinary tract infection.  1. Unstable angina: The patient's chest pain is resolved. He has an extensive history of coronary artery disease status post bypass graft surgery and multiple percutaneous interventions. His pain is likely secondary to intermittently poor perfusion. He seems to be medically optimized at this time although he may benefit from a long-acting nitrate. However this could be problematic as the patient's blood pressure is generally relatively low. We will consult cardiology for further recommendations. 2. GI bleed: The patient has painless GI bleeding. It appears as dark oozing blood that was really from his rectum. The patient is on Eliquis which is necessary for his thromboembolism prevention due to atrial fibrillation. His hemoglobin is within normal limits. The patient is hemodynamically stable. GI consultation has been ordered. 3. Urinary tract infection: Present on admission. Likely secondary to indwelling Foley catheter. The patient has received a dose of Levaquin in the emergency department. 4. Diabetes mellitus type 2: The patient's blood sugar is elevated currently. We will continue his basal insulin at night and add sliding scale insulin during the day. I have held his oral hypoglycemics. 5. Atrial fibrillation: Continue Eliquis 6.  Chronic systolic heart failure: No exacerbation at this time. Continue carvedilol and spironolactone 7. Hypertension: Continue valsartan 8. DVT prophylaxis: As above 9. GI prophylaxis: Proton pump inhibitor The patient is a full code. Time spent on admission orders and patient care approximately 45 minutes  Reilly, Blades 03/22/2015, 7:13 AM

## 2015-03-22 NOTE — ED Notes (Signed)
Blood pressure 99/62. Pt sleeping, supine. No new bleeding noted from rectum at this time.

## 2015-03-22 NOTE — Consult Note (Signed)
Patient seen in consultation today by Amedeo KinsmanKimberly Mills please see her complete note  Patient complains of left chest pain going to his arm with shortness of breath he has a history of coronary artery disease with the CABG done in the 1980s history of a non-ST EMI in 2012, a long history of diabetes and hypertension and a right below the knee amputation  Patient has been on [place] medication  Patient had dark red blood and melena from his rectum while on the Eliquis. He had stabbing chest pain last night.  He has family history of heart disease his brother died of an MI in 2014  Examination shows his chest is clear heart regular rate and rhythm and his abdomen is negative.  Assessment GI bleed unknown origin in a patient with significant heart disease  Plan is to follow patient along and consider upper endoscopy and possible colonoscopy while in the hospital

## 2015-03-22 NOTE — ED Notes (Signed)
Pt updated on admission status and repositioned in bed for comfort.

## 2015-03-22 NOTE — ED Notes (Signed)
Report to megan, rn.  

## 2015-03-22 NOTE — Consult Note (Signed)
Cardiology Consultation Note  Patient ID: [name], MRN: [id number], DOB/AGE: 07/20/1956 59 y.o. Admit date: 03/21/2015   Date of Consult: 03/22/2015 Primary Physician: Dortha Kern, MD Primary Cardiologist: Dr. Kirke Corin, MD  Chief Complaint: Chest pain and SOB x 1 day Reason for Consult: Chest pain  HPI: 59 y.o. male with h/o CAD s/p CABG in 80s/90s s/p stenting 04/2013, PAF on Eliquis, chronic systolic heart failure due to ischemic cardiomyopathy, HTN, IDDM, PVD s/p right BKA, pulmonary emphysema, and obesity with recent admission to [facility] in April 2016 for rhabdomyolysis, acute encephalopathy, cellulitis of left lower extremity, and stage III decubitus ulcer who presented to [facility] on 5/8 with 1 day history of chest pain and SOB.  He has remote history of CABG with multiple subsequent cardiac catheterizations and PCI. He presented in June of 2014 with non-ST elevation myocardial infarction and was transferred to [facility]. He underwent cardiac catheterization by Dr. Fransico Chandon which showed significant left main and three-vessel coronary artery disease (nondominant RCA) with patent LIMA to LAD and occluded SVG to OM (new) and SVG to PDA (old). Attempted PCI was done on the left main and left circumflex. The procedure was very difficult due to heavy calcifications. It was unsuccessful due to inability to deliver a stent to the left circumflex. A small 2.5 mm drug-eluting stent was placed in the distal left main extending into the ostial left circumflex.   He presented to [facility] again in July 2014, with a non-ST elevation myocardial infarction. Repeat cardiac catheterization at that time showed subtotally occluded left circumflex at the site of PCI. Ejection fraction was 35%. LIMA to LAD was patent and provided reasonable collaterals to the RCA. Case was discussed during cath conference and medical therapy was recommended.   At his last office follow up in 12/2014 he was doing well from an aginal state. He was  not having any anginal symptoms at that time. He was continued on aspirin 81 mg, Coreg 12.g mg bid, Zocor 10 mg, and SL NTG. He was also on optimal medical therpay for his chronic systolic HF (ischemic in etiology). He was continued on losartan 100 mg and Aldactone 25 mg. He was noted noted to be in a-fib at that OV - rate controlled.  He has known history of diabetes and peripheral vascular disease with right below the knee amputation. He also has history of paroxysmal atrial flutter/fib documented during his previous admissions at [facility]. He has been taking his medications regularly.  He had a recent prolonged hospitalization in April 2016 secondary to rhabdomyolysis in the setting of being down on the ground for 2 days, covered in feces. He was found to have cellulitis of the left leg and a stage III decubitus ulcer. He was was noted to be encephalopathic. He improved with treatment and wand was discharged to rehab.        Since his last hospitalization he has not had any chest pain or increased dyspnea until 5/8 when he was at rest. His symptoms began around 3PM however he did not present to [facility] until around 8PM as he was trying to get them to resolve on his own. He is not certain what made his symptoms resolve or if they resolved on their own. He notes some left arm pain associated with his chest pain. He notes more SOB than chest pain. No associated nausea, vomiting, diaphoresis, palpitations, presyncope, or syncope. He has been chest pain free since the prior evening. CTA of the chest  was negative for PE. He was also noted to have a persisting stage III sacral decubitus ulcer that he was unaware of as well as persisting left left leg cellulitis. Upon his arrival he was found to have a troponin that was 0.05-->0.03, BNP 797, hgb 11.6, WBC 7.7, with pulmonary vascular congestion. He was continued on his current medications and multiple consults were placed. He is currently asymptomatic.      Past Medical History  Diagnosis Date  . HTN (hypertension)   . 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 Anxiety   . Headache(784.0)   . NSTEMI (non-ST elevated myocardial infarction) 10/17/2011  . Diabetes mellitus   . Pulmonary emphysema   . Obesity   . Paroxysmal atrial flutter 10/15/2011  . CAD (coronary artery disease), autologous vein bypass graft     s/p CABG in 80's or 90's, multiple caths/PCI. Presented in June of 2014 with non-ST elevation myocardial infarction. Cardiac catheterization done by Dr. Fransico Kaushal at [facility] showed severe three-vessel coronary artery disease (nondominant RCA )with patent LIMA to LAD, occluded SVG to OM1(new) and occluded SVG to PDA. There was significant ostial and distal left main stenosis. Attempted left main/LCX PCI  . MI (myocardial infarction)   . Chronic systolic heart failure     Ejection fraction 35%  . Broken femur   . Chronic indwelling Foley catheter   . CHF (congestive heart failure)       Most Recent Cardiac Studies: Echo 02/15/2015:  Summary:  1. Left ventricular ejection fraction, by visual estimation, is 30 to  35%.  2. Mid and apical inferior septum and apex are abnormal.  3. Severely dilated left atrium.  4. Mildly dilated right atrium.  5. Mild mitral valve regurgitation.  6. Mild aortic valve sclerosis without stenosis.  7. Moderately increased left ventricular posterior wall thickness.  8. Mild tricuspid regurgitation.    Surgical History:  Past Surgical History  Procedure Laterality Date  . Coronary artery bypass graft    . No past surgeries    . Amputation  10/17/2011    Procedure: AMPUTATION RAY;  Surgeon: Cammy Copa;  Location: [facility] OR;  Service: Orthopedics;  Laterality: Right;  First and Second Ray Amputation  . Amputation  11/01/2011    Procedure: AMPUTATION BELOW KNEE;  Surgeon: Nadara Mustard, MD;  Location: [facility] OR;  Service:  Orthopedics;  Laterality: Right;  Right Below Knee Amputation  . Rt bka    . Cardiac catheterization  09/2006    [facility];EF 60%  . Cardiac catheterization  05/30/13    [facility]: Occluded SVG to RCA and SVG to OM (both chronic) , patent LIMA to LAD which gives collaterals to LCx and RCA. Subtotal occlusion of the native left circumflex at the site of PCI done at [facility]. Ejection fraction 55%     Home Meds: Prior to Admission medications   Medication Sig Start Date End Date Taking? Authorizing Provider  apixaban (ELIQUIS) 5 MG TABS tablet Take 1 tablet (5 mg total) by mouth 2 (two) times daily. 07/24/14   Iran Ouch, MD  aspirin EC 81 MG tablet Take 1 tablet (81 mg total) by mouth daily. 01/23/14   Iran Ouch, MD  carvedilol (COREG) 12.5 MG tablet Take 1 tablet (12.5 mg total) by mouth 2 (two) times daily. 08/24/14   Iran Ouch, MD  cetirizine (ZYRTEC) 10 MG tablet Take 10 mg by mouth  daily.    Historical Provider, MD  gabapentin (NEURONTIN) 300 MG capsule Take 300 mg by mouth 3 (three) times daily.    Historical Provider, MD  insulin detemir (LEVEMIR) 100 UNIT/ML injection Inject 20 Units into the skin daily.     Historical Provider, MD  losartan (COZAAR) 100 MG tablet TAKE ONE TABLET BY MOUTH ONCE DAILY 07/07/14   Iran Ouch, MD  nitroGLYCERIN (NITROSTAT) 0.4 MG SL tablet Place 1 tablet (0.4 mg total) under the tongue every 5 (five) minutes as needed for chest pain. 01/04/15   Iran Ouch, MD  sertraline (ZOLOFT) 25 MG tablet Take 25 mg by mouth daily.    Historical Provider, MD  simvastatin (ZOCOR) 10 MG tablet Take 10 mg by mouth at bedtime.    Historical Provider, MD  spironolactone (ALDACTONE) 25 MG tablet Take 25 mg by mouth daily.    Historical Provider, MD  traMADol (ULTRAM) 50 MG tablet Take 50 mg by mouth every 6 (six) hours as needed.  07/22/14   Historical Provider, MD    Inpatient Medications:  . apixaban  5 mg Oral BID  . aspirin EC  81 mg Oral Daily  .  carvedilol  12.5 mg Oral BID  . gabapentin  300 mg Oral TID  . insulin aspart  0-15 Units Subcutaneous TID WC  . insulin detemir  20 Units Subcutaneous QHS  . loratadine  10 mg Oral Daily  . losartan  100 mg Oral Daily  . sertraline  25 mg Oral Daily  . simvastatin  10 mg Oral QHS  . spironolactone  25 mg Oral Daily      Allergies: No Known Allergies  History   Social History  . Marital Status: Single    Spouse Name: N/A  . Number of Children: N/A  . Years of Education: N/A   Occupational History  . Not on file.   Social History Main Topics  . Smoking status: Former Smoker -- 0.50 packs/day for 35 years    Types: Cigarettes    Quit date: 07/02/2001  . Smokeless tobacco: Never Used  . Alcohol Use: No  . Drug Use: No  . Sexual Activity: Not Currently   Other Topics Concern  . Not on file   Social History Narrative   Difficult home situation because he cares for his dad with Alzheimer's, younger brother lives at home as well but doesn't get along with the dad at all and patient is in between the two, creating significant stress. Pt is concerned about who will care for the dad while he is in the hospital, is requesting help. He is single and has no children and only source of help is his niece.      Family History  Problem Relation Age of Onset  . Alzheimer's disease Father   . Melanoma Father   . Benign prostatic hyperplasia Father   . Coronary artery disease Father     s/p CABG  . Heart attack Brother 57    MI  . Coronary artery disease Brother     s/p CABG  . Lymphoma Sister     non Hodgkin's   . Aortic aneurysm Mother   . Anemia Mother      Review of Systems: Review of Systems  Constitutional: Positive for weight loss and malaise/fatigue. Negative for fever, chills and diaphoresis.  HENT: Negative for hearing loss.   Eyes: Negative for blurred vision, double vision, photophobia, pain, discharge and redness.  Respiratory: Positive for cough,  shortness of  breath and wheezing. Negative for hemoptysis and sputum production.   Cardiovascular: Positive for chest pain. Negative for palpitations, orthopnea, claudication, leg swelling and PND.  Gastrointestinal: Positive for abdominal pain and melena. Negative for heartburn, nausea, vomiting, diarrhea and blood in stool.  Skin: Positive for rash.  Neurological: Positive for weakness. Negative for headaches.     Labs:  Recent Labs  03/21/15 2200 03/22/15 0230  TROPONINI 0.05* 0.03   Lab Results  Component Value Date   WBC 7.7 03/21/2015   HGB 11.6* 03/21/2015   HCT 36.6* 03/21/2015   MCV 75.7* 03/21/2015   PLT 145* 03/21/2015    Recent Labs Lab 03/21/15 2200  NA 137  K 3.9  CL 100*  CO2 28  BUN 30*  CREATININE 1.09  CALCIUM 9.0  PROT 6.9  BILITOT 0.6  ALKPHOS 104  ALT 24  AST 33  GLUCOSE 342*   Lab Results  Component Value Date   CHOL 150 10/15/2011   HDL 13* 10/15/2011   LDLCALC 100* 10/15/2011   TRIG 183* 10/15/2011   Lab Results  Component Value Date   DDIMER 3.60* 10/25/2011    Radiology/Studies:  Ct Angio Chest Pe W/cm &/or Wo Cm  03/22/2015   CLINICAL DATA:  Left-sided chest pain and shortness of breath starting at 3 p.m. yesterday.  EXAM: CT ANGIOGRAPHY CHEST WITH CONTRAST  TECHNIQUE: Multidetector CT imaging of the chest was performed using the standard protocol during bolus administration of intravenous contrast. Multiplanar CT image reconstructions and MIPs were obtained to evaluate the vascular anatomy.  CONTRAST:  100mL OMNIPAQUE IOHEXOL 350 MG/ML SOLN  COMPARISON:  10/15/2011  FINDINGS: Technically adequate study with good opacification of the central and segmental pulmonary arteries. No focal filling defects are identified. No evidence of significant pulmonary embolus.  Cardiac enlargement. Normal caliber thoracic aorta. Aortic calcification. Coronary artery calcification with postoperative changes consistent with bypass grafting. Passive congestion of  contrast material into the liver may indicate right heart failure. Mediastinal lymphadenopathy with enlarged right pretracheal node measuring up to about 2.2 cm short axis dimension. This is nonspecific but does represent an increase since the previous study. Esophagus is decompressed.  Evaluation of lungs is limited due to respiratory motion artifact. There is evidence of atelectasis in the lung bases. No discrete infiltration. Mild interstitial changes are suggested which may indicate edema. Airways are patent. No pleural effusions. No pneumothorax.  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 Cholelithiasis with small stones in the gallbladder. Prominent calcification in the superior mesenteric artery and branches of the celiac axis.  Review of the MIP images confirms the above findings.  IMPRESSION: No evidence of significant pulmonary embolus. Cardiac enlargement with passive hepatic congestion and changes of probable hepatic cirrhosis. Mediastinal lymphadenopathy, increasing since previous study. Atelectasis in the lung bases.   Electronically Signed   By: Burman NievesWilliam  Stevens M.D.   On: 03/22/2015 04:28   Dg Chest Port 1 View  03/21/2015   CLINICAL DATA:  Chest pain and shortness of breath since this afternoon. No cough.  EXAM: PORTABLE CHEST - 1 VIEW  COMPARISON:  02/14/2015  FINDINGS: Postoperative changes in the mediastinum. Shallow inspiration. Cardiac enlargement with mild prominence of pulmonary vascular shadow suggesting mild vascular congestion. No focal airspace disease or consolidation. No blunting of costophrenic angles. No pneumothorax. Calcified and tortuous aorta.  IMPRESSION: Cardiac enlargement with suggestion of mild developing pulmonary vascular congestion   Electronically Signed   By: Chrissie NoaWilliam  Andria MeuseStevens M.D.   On: 03/21/2015 23:03    EKG: a-fib, 82 bpm, TWI inferolateral leads, V2-V4  Weights: [employer]   03/21/15 2208  Weight: 197  lb (89.359 kg)     Physical Exam: Blood pressure 128/72, pulse 76, temperature 97.6 F (36.4 C), temperature source Oral, resp. rate 17, height 5\' 7"  (1.702 m), weight 197 lb (89.359 kg), SpO2 100 %. Body mass index is 30.85 kg/(m^2). General: Well developed, well nourished, in no acute distress. Head: Normocephalic, atraumatic, sclera non-icteric, no xanthomas, nares are without discharge.  Neck: Negative for carotid bruits. JVD not elevated. Lungs: Clear bilaterally to auscultation without wheezes, rales, or rhonchi. Breathing is unlabored. Heart: Irregularly irregular, with S1 S2. No murmurs, rubs, or gallops appreciated. Abdomen: Soft, non-tender, obese, non-distended with normoactive bowel sounds. No hepatomegaly. No rebound/guarding. No obvious abdominal masses. Msk:  Strength and tone appear normal for age. Extremities: No clubbing or cyanosis. No edema. Right BKA.    Neuro: Alert and oriented X 3. No facial asymmetry. No focal deficit. Moves all extremities spontaneously. Psych:  Responds to questions appropriately with a normal affect.    Assessment and Plan:  59 year old male with history of CAD s/p CABG in 80s/90s s/p stenting 04/2013, PAF on Eliquis, chronic systolic heart failure due to ischemic cardiomyopathy, HTN, IDDM, PVD s/p right BKA, pulmonary emphysema, and obesity with recent [facility] in [facility] for rhabdomyolysis, acute encephalopathy, cellulitis of left lower extremity, and stage III decubitus ulcer who [facility] with 1 day history of chest pain and SOB.  1. Unstable angina/SOB with history of CAD as above: -Last cardiac cath in 05/2013 was discussed extensively among many cardiologists with the consensus being medical management being the optimal approach for the patient  -Could add low dose Imdur 30 mg daily if blood pressure continues to remain tolerable of this dose (he has been hypotensive into the 80s-90s systolic this admission) -Echo  02/15/2015 showed EF 30-35%, mid and apical inferior septum and apex are hypokinetic, no redo cath targets (from cath 05/2013), will not repeat this admission -Continue medical treatment with home medications   2. A-fib with RVR: -Rate controlled -Continue home medications (Coreg 12.5 mg) -With his pressure ulcer and melena may need to discontinue Eliquis, await GI and wound consult  3. History of altered mental status: -Resolved   4. Cellulitis/ulcerations: -Wound care team  5. IDDM: -Per IM  6. PVD: -Status post right BKA -May need left lower extremity arteriography   7. Dispo: -Will need continued placement  Signed, Luian Schumpert PA-C 03/22/2015, 9:26 AM

## 2015-03-22 NOTE — Progress Notes (Signed)
Consultation  Referring Provider: Dr. Clent Ridges Primary Care Physician:  Dortha Kern, MD Consulting  Gastroenterologist: Dr. Lynnae Prude        Reason for Consultation: GI bleed             HPI:   [name] is a 59 y.o. male admitted 03/21/15 from [facility] per c/o chest pain and rectal bleeding.  He currently takes Eliquis for  A-Fib. He has a significant cardiac history. He reports onset of rectal bleeding at the facility. Bowel habits are at baseline-occasional loose to formed. He has onset of lower abdominal crampy - gas pain 4 weeks ago. This is present intermittent most days for several minutes to longer. He thinks it is associated with eating milk and eggs. He denies any upper gi complaints. He states that he has never had a colonoscopy.  He reports a normal diet and appetite. He notes weight loss- undetermined amount- maybe 30 lbs per his estimate.    Past Medical History  Diagnosis Date  . HTN (hypertension)   . 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 Anxiety   . Headache(784.0)   . NSTEMI (non-ST elevated myocardial infarction) 10/17/2011  . Diabetes mellitus   . Pulmonary emphysema   . Obesity   . Paroxysmal atrial flutter 10/15/2011  . CAD (coronary artery disease), autologous vein bypass graft     s/p CABG in 80's or 90's, multiple caths/PCI. Presented in June of 2014 with non-ST elevation myocardial infarction. Cardiac catheterization done by Dr. Fransico Elsie at [facility] showed severe three-vessel coronary artery disease (nondominant RCA )with patent LIMA to LAD, occluded SVG to OM1(new) and occluded SVG to PDA. There was significant ostial and distal left main stenosis. Attempted left main/LCX PCI  . MI (myocardial infarction)   . Chronic systolic heart failure     Ejection fraction 35%  . Broken femur   . Chronic indwelling Foley catheter   . CHF (congestive heart failure)     Past  Surgical History  Procedure Laterality Date  . Coronary artery bypass graft    . No past surgeries    . Amputation  10/17/2011    Procedure: AMPUTATION RAY;  Surgeon: Cammy Copa;  Location: [facility] OR;  Service: Orthopedics;  Laterality: Right;  First and Second Ray Amputation  . Amputation  11/01/2011    Procedure: AMPUTATION BELOW KNEE;  Surgeon: Nadara Mustard, MD;  Location: [facility] OR;  Service: Orthopedics;  Laterality: Right;  Right Below Knee Amputation  . Rt bka    . Cardiac catheterization  09/2006    [facility];EF 60%  . Cardiac catheterization  05/30/13    [facility]: Occluded SVG to RCA and SVG to OM (both chronic) , patent LIMA to LAD which gives collaterals to LCx and RCA. Subtotal occlusion of the native left circumflex at the site of PCI done at [facility]. Ejection fraction 55%    Family History  Problem Relation Age of Onset  . Alzheimer's disease Father   . Melanoma Father   . Benign prostatic hyperplasia Father   . Coronary artery disease Father     s/p CABG  . Heart attack Brother 57    MI  . Coronary artery disease Brother     s/p CABG  . Lymphoma Sister     non Hodgkin's   . Aortic aneurysm Mother   . Anemia Mother  History  Substance Use Topics  . Smoking status: Former Smoker -- 0.50 packs/day for 35 years    Types: Cigarettes    Quit date: 07/02/2001  . Smokeless tobacco: Never Used  . Alcohol Use: No    Prior to Admission medications   Medication Sig Start Date End Date Taking? Authorizing Provider  citalopram (CELEXA) 10 MG tablet Take 10 mg by mouth daily.   Yes Historical Provider, MD  gabapentin (NEURONTIN) 300 MG capsule Take 300 mg by mouth 3 (three) times daily.   Yes Historical Provider, MD  insulin glargine (LANTUS) 100 UNIT/ML injection Inject 20 Units into the skin at bedtime.   Yes Historical Provider, MD  nitroGLYCERIN (NITROSTAT) 0.4 MG SL tablet Place 1 tablet (0.4 mg total) under the tongue every 5 (five) minutes as needed for chest pain. 01/04/15   Yes [place] OuchMuhammad A Arida, MD  sertraline (ZOLOFT) 25 MG tablet Take 25 mg by mouth daily.   Yes Historical Provider, MD  apixaban (ELIQUIS) 5 MG TABS tablet Take 1 tablet (5 mg total) by mouth 2 (two) times daily. Patient not taking: Reported on 03/22/2015 07/24/14   [place] OuchMuhammad A Arida, MD  aspirin EC 81 MG tablet Take 1 tablet (81 mg total) by mouth daily. Patient not taking: Reported on 03/22/2015 01/23/14   [place] OuchMuhammad A Arida, MD  carvedilol (COREG) 12.5 MG tablet Take 1 tablet (12.5 mg total) by mouth 2 (two) times daily. Patient not taking: Reported on 03/22/2015 08/24/14   [place] OuchMuhammad A Arida, MD  cetirizine (ZYRTEC) 10 MG tablet Take 10 mg by mouth daily.    Historical Provider, MD  insulin detemir (LEVEMIR) 100 UNIT/ML injection Inject 20 Units into the skin daily.     Historical Provider, MD  losartan (COZAAR) 100 MG tablet TAKE ONE TABLET BY MOUTH ONCE DAILY Patient not taking: Reported on 03/22/2015 07/07/14   [place] OuchMuhammad A Arida, MD  simvastatin (ZOCOR) 10 MG tablet Take 10 mg by mouth at bedtime.    Historical Provider, MD  spironolactone (ALDACTONE) 25 MG tablet Take 25 mg by mouth daily.    Historical Provider, MD  traMADol (ULTRAM) 50 MG tablet Take 50 mg by mouth every 6 (six) hours as needed.  07/22/14   Historical Provider, MD    Current Facility-Administered Medications  Medication Dose Route Frequency Provider Last Rate Last Dose  . carvedilol (COREG) tablet 12.5 mg  12.5 mg Oral BID Arnaldo NatalMichael S Diamond, MD   12.5 mg at 03/22/15 1109  . gabapentin (NEURONTIN) capsule 300 mg  300 mg Oral TID Arnaldo NatalMichael S Diamond, MD   300 mg at 03/22/15 1108  . insulin aspart (novoLOG) injection 0-15 Units  0-15 Units Subcutaneous TID WC Arnaldo NatalMichael S Diamond, MD   0 Units at 03/22/15 1428  . insulin detemir (LEVEMIR) injection 20 Units  20 Units Subcutaneous QHS Arnaldo NatalMichael S Diamond, MD      . isosorbide mononitrate (IMDUR) 24 hr tablet 30 mg  30 mg Oral Daily [place] OuchMuhammad A Arida, MD   30 mg at 03/22/15 1432  . [START ON  03/23/2015] Levofloxacin (LEVAQUIN) IVPB 250 mg  250 mg Intravenous Q24H Gale Journeyatherine P Walsh, MD      . loratadine (CLARITIN) tablet 10 mg  10 mg Oral Daily Arnaldo NatalMichael S Diamond, MD   10 mg at 03/22/15 1109  . nitroGLYCERIN (NITROSTAT) SL tablet 0.4 mg  0.4 mg Sublingual Q5 min PRN Arnaldo NatalMichael S Diamond, MD      . sertraline (ZOLOFT) tablet 25 mg  25 mg Oral  Daily Arnaldo Natal, MD   25 mg at 03/22/15 1108  . simvastatin (ZOCOR) tablet 10 mg  10 mg Oral QHS Arnaldo Natal, MD      . spironolactone (ALDACTONE) tablet 25 mg  25 mg Oral Daily Arnaldo Natal, MD   25 mg at 03/22/15 1108  . traMADol (ULTRAM) tablet 50 mg  50 mg Oral Q6H PRN Arnaldo Natal, MD        Allergies as of 03/21/2015  . (No Known Allergies)     Review of Systems:    A 12 system review was obtained and pertinent positives as noted in HPI.  He is  wheelchair bound. He denies CP, SOB, DOE.     Physical Exam:  Vital signs in last 24 hours: Temp:  [97.6 F (36.4 C)-98.6 F (37 C)] 98.4 F (36.9 C) (05/09 1114) Pulse Rate:  [47-85] 80 (05/09 1114) Resp:  [12-22] 16 (05/09 1114) BP: (89-142)/(54-75) 117/72 mmHg (05/09 1114) SpO2:  [91 %-100 %] 100 % (05/09 1114) Weight:  [89.359 kg (197 lb)] 89.359 kg (197 lb) (05/08 2208) Last BM Date: 03/22/15  General:  Well-developed, well-nourished and in no acute distress Head:  Head without obvious abnormality, atraumatic  Eyes:   Conjunctiva pink, sclera anicteric   ENT:   Mouth free of lesions, mucosa moist, tongue pink, no thrush noted Neck:   Supple w/o thyromegaly or mass, trachea midline, no adenopathy  Lungs: Clear to auscultation bilaterally, respirations unlabored Heart:     Irreg, irreg  S1S2, no rubs, murmurs, gallops. Abdomen: Soft bilateral lower abdominal tenderness, no hepatosplenomegaly, hernia, or mass and BS normal Rectal: Stool is thick red-maroon Lymph:  No cervical or supraclavicular adenopathy. Extremities:   No edema, cyanosis, or clubbing, RBKA,  left calf wound Skin  Skin color pale, positive sacral wound covered with dressing, Neuro:  A&O x 3. CNII-XII intact, normal strength, fair historian Psych:  Appropriate mood and affect. GU:   Chronic foley catheter   Data Reviewed:  LAB RESULTS:  Recent Labs  03/22/15 0911 03/22/15 1601  WBC 6.3 6.9  HGB 12.0* 11.3*  HCT 38.2* 35.7*  PLT 131* 127*   BMET  Recent Labs  03/21/15 2200  NA 137  K 3.9  CL 100*  CO2 28  GLUCOSE 342*  BUN 30*  CREATININE 1.09  CALCIUM 9.0   LFT  Recent Labs  03/21/15 2200  PROT 6.9  ALBUMIN 3.1*  AST 33  ALT 24  ALKPHOS 104  BILITOT 0.6  BILIDIR 0.2  IBILI 0.4   PT/INR No results for input(s): LABPROT, INR in the last 72 hours.  STUDIES: Ct Angio Chest Pe W/cm &/or Wo Cm  03/22/2015   CLINICAL DATA:  Left-sided chest pain and shortness of breath starting at 3 p.m. yesterday.  EXAM: CT ANGIOGRAPHY CHEST WITH CONTRAST  TECHNIQUE: Multidetector CT imaging of the chest was performed using the standard protocol during bolus administration of intravenous contrast. Multiplanar CT image reconstructions and MIPs were obtained to evaluate the vascular anatomy.  CONTRAST:  OMNIPAQUE IOHEXOL 350 MG/ML SOLN  COMPARISON:  10/15/2011  FINDINGS: Technically adequate study with good opacification of the central and segmental pulmonary arteries. No focal filling defects are identified. No evidence of significant pulmonary embolus.  Cardiac enlargement. Normal caliber thoracic aorta. Aortic calcification. Coronary artery calcification with postoperative changes consistent with bypass grafting. Passive congestion of contrast material into the liver may indicate right heart failure. Mediastinal lymphadenopathy with enlarged right pretracheal node  measuring up to about 2.2 cm short axis dimension. This is nonspecific but does represent an increase since the previous study. Esophagus is decompressed.  Evaluation of lungs is limited due to respiratory  motion artifact. There is evidence of atelectasis in the lung bases. No discrete infiltration. Mild interstitial changes are suggested which may indicate edema. Airways are patent. No pleural effusions. No pneumothorax.  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 Cholelithiasis with small stones in the gallbladder. Prominent calcification in the superior mesenteric artery and branches of the celiac axis.  Review of the MIP images confirms the above findings.  IMPRESSION: No evidence of significant pulmonary embolus. Cardiac enlargement with passive hepatic congestion and changes of probable hepatic cirrhosis. Mediastinal lymphadenopathy, increasing since previous study. Atelectasis in the lung bases.   Electronically Signed   By: Burman NievesWilliam  Stevens M.D.   On: 03/22/2015 04:28   Dg Chest Port 1 View  03/21/2015   CLINICAL DATA:  Chest pain and shortness of breath since this afternoon. No cough.  EXAM: PORTABLE CHEST - 1 VIEW  COMPARISON:  02/14/2015  FINDINGS: Postoperative changes in the mediastinum. Shallow inspiration. Cardiac enlargement with mild prominence of pulmonary vascular shadow suggesting mild vascular congestion. No focal airspace disease or consolidation. No blunting of costophrenic angles. No pneumothorax. Calcified and tortuous aorta.  IMPRESSION: Cardiac enlargement with suggestion of mild developing pulmonary vascular congestion   Electronically Signed   By: Burman NievesWilliam  Stevens M.D.   On: 03/21/2015 23:03     Assessment:  Flora LippsMichael D Jahnke is a 59 y.o. presents  with lower gi bleeding and diffuse lower abdominal cramps. CTA shows evidence of probable hepatic  cirrhosis. He denies ETOH history. Positive obesity, DM, and signigicant cardiac history.   Plan:  Stat GI bleed scan Serial Hgb q 8hours Transfuse if Hgb <8 secondary to heart disease Hold Eliquis   This case was discussed with Dr. Scot Junobert T. Elliott in collaboration of care. Thank you  for the consultation.  These services provided by Amedeo KinsmanKimberly Finnlee Guarnieri RN, MSN, ANP-BC under collaborative practice agreement with Scot Junobert T. Elliott, MD.  03/22/2015, 5:04 PM

## 2015-03-22 NOTE — ED Notes (Signed)
Dr. Diamond in to see pt.  

## 2015-03-22 NOTE — ED Notes (Signed)
Dr. Zenda AlpersWebster notified of pt's systolic blood pressure in mid to low 90s. No new orders received.

## 2015-03-23 DIAGNOSIS — I25711 Atherosclerosis of autologous vein coronary artery bypass graft(s) with angina pectoris with documented spasm: Secondary | ICD-10-CM

## 2015-03-23 DIAGNOSIS — I200 Unstable angina: Secondary | ICD-10-CM

## 2015-03-23 DIAGNOSIS — K2971 Gastritis, unspecified, with bleeding: Secondary | ICD-10-CM

## 2015-03-23 DIAGNOSIS — I739 Peripheral vascular disease, unspecified: Secondary | ICD-10-CM

## 2015-03-23 LAB — GLUCOSE, CAPILLARY
Glucose-Capillary: 179 mg/dL — ABNORMAL HIGH (ref 70–99)
Glucose-Capillary: 232 mg/dL — ABNORMAL HIGH (ref 70–99)
Glucose-Capillary: 196 mg/dL — ABNORMAL HIGH (ref 70–99)
Glucose-Capillary: 261 mg/dL — ABNORMAL HIGH (ref 70–99)

## 2015-03-23 LAB — CBC
HCT: 34.5 % — ABNORMAL LOW (ref 40.0–52.0)
HCT: 38.1 % — ABNORMAL LOW (ref 40.0–52.0)
Hemoglobin: 10.8 g/dL — ABNORMAL LOW (ref 13.0–18.0)
Hemoglobin: 12 g/dL — ABNORMAL LOW (ref 13.0–18.0)
MCH: 23.9 pg — ABNORMAL LOW (ref 26.0–34.0)
MCH: 24.1 pg — ABNORMAL LOW (ref 26.0–34.0)
MCHC: 31.4 g/dL — ABNORMAL LOW (ref 32.0–36.0)
MCHC: 31.5 g/dL — ABNORMAL LOW (ref 32.0–36.0)
MCV: 76.3 fL — ABNORMAL LOW (ref 80.0–100.0)
MCV: 76.5 fL — ABNORMAL LOW (ref 80.0–100.0)
Platelets: 123 10*3/uL — ABNORMAL LOW (ref 150–440)
Platelets: 128 10*3/uL — ABNORMAL LOW (ref 150–440)
RBC: 4.52 MIL/uL (ref 4.40–5.90)
RBC: 4.98 MIL/uL (ref 4.40–5.90)
RDW: 19.8 % — ABNORMAL HIGH (ref 11.5–14.5)
RDW: 19.9 % — ABNORMAL HIGH (ref 11.5–14.5)
WBC: 6.9 10*3/uL (ref 3.8–10.6)
WBC: 7.6 10*3/uL (ref 3.8–10.6)

## 2015-03-23 MED ORDER — RANOLAZINE ER 500 MG PO TB12
500.0000 mg | ORAL_TABLET | Freq: Two times a day (BID) | ORAL | Status: DC
  Administered 2015-03-23 – 2015-03-24 (×3): 500 mg via ORAL
  Filled 2015-03-23 (×2): qty 1

## 2015-03-23 MED ORDER — ISOSORBIDE DINITRATE 10 MG PO TABS
10.0000 mg | ORAL_TABLET | Freq: Three times a day (TID) | ORAL | Status: DC
  Administered 2015-03-23 – 2015-03-24 (×3): 10 mg via ORAL
  Filled 2015-03-23 (×5): qty 1

## 2015-03-23 NOTE — Progress Notes (Signed)
[facility] [place] at [facility]   PATIENT NAME: [name]    MR#:  [id number]  DATE OF BIRTH:  [birth year]  SUBJECTIVE:  Lower abdominal pain last night, no further bleeding noted. Did have an episode of bradycardia and hypotension this morning which has now resolved.  REVIEW OF SYSTEMS:  Review of Systems  Constitutional: Negative for fever.  Respiratory: Negative for shortness of breath.   Cardiovascular: Positive for palpitations. Negative for chest pain.  Gastrointestinal: Positive for abdominal pain. Negative for heartburn.  Genitourinary: Negative for dysuria.  Skin: Negative for rash.  Neurological: Negative for headaches.     DRUG ALLERGIES:  No Known Allergies  VITALS:  Blood pressure 97/73, pulse 73, temperature 98.1 F (36.7 C), temperature source Oral, resp. rate 18, height 5\' 7"  (1.702 m), weight 87.272 kg (192 lb 6.4 oz), SpO2 82 %.  PHYSICAL EXAMINATION:  Physical Exam  Constitutional: He is oriented to person, place, and time. He appears well-developed and well-nourished.  HENT:  Head: Normocephalic and atraumatic.  Eyes: EOM are normal. Pupils are equal, round, and reactive to light.  Neck: No JVD present. No thyromegaly present.  Cardiovascular: Normal rate and regular rhythm.   No murmur heard. Pulmonary/Chest: Effort normal and breath sounds normal.  Abdominal: Soft. He exhibits no distension. There is tenderness. There is no guarding.  Hypoactive BS, ttp in B lower quadrants  Neurological: He is oriented to person, place, and time.  Skin: Skin is warm and dry.  Psychiatric: He has a normal mood and affect.     LABORATORY PANEL:   CBC  Recent Labs Lab 03/23/15 0506  WBC 6.9  HGB 10.8*  HCT 34.5*  PLT 123*   ------------------------------------------------------------------------------------------------------------------  Chemistries   Recent Labs Lab 03/21/15 2200  NA 137  K 3.9  CL 100*  CO2 28   GLUCOSE 342*  BUN 30*  CREATININE 1.09  CALCIUM 9.0  AST 33  ALT 24  ALKPHOS 104  BILITOT 0.6   ------------------------------------------------------------------------------------------------------------------  Cardiac Enzymes  Recent Labs Lab 03/22/15 0230  TROPONINI 0.03   ------------------------------------------------------------------------------------------------------------------  RADIOLOGY:  Ct Angio Chest Pe W/cm &/or Wo Cm  03/22/2015   CLINICAL DATA:  Left-sided chest pain and shortness of breath starting at 3 p.m. yesterday.  EXAM: CT ANGIOGRAPHY CHEST WITH CONTRAST  TECHNIQUE: Multidetector CT imaging of the chest was performed using the standard protocol during bolus administration of intravenous contrast. Multiplanar CT image reconstructions and MIPs were obtained to evaluate the vascular anatomy.  CONTRAST:  100mL OMNIPAQUE IOHEXOL 350 MG/ML SOLN  COMPARISON:  10/15/2011  FINDINGS: Technically adequate study with good opacification of the central and segmental pulmonary arteries. No focal filling defects are identified. No evidence of significant pulmonary embolus.  Cardiac enlargement. Normal caliber thoracic aorta. Aortic calcification. Coronary artery calcification with postoperative changes consistent with bypass grafting. Passive congestion of contrast material into the liver may indicate right heart failure. Mediastinal lymphadenopathy with enlarged right pretracheal node measuring up to about 2.2 cm short axis dimension. This is nonspecific but does represent an increase since the previous study. Esophagus is decompressed.  Evaluation of lungs is limited due to respiratory motion artifact. There is evidence of atelectasis in the lung bases. No discrete infiltration. Mild interstitial changes are suggested which may indicate edema. Airways are patent. No pleural effusions. No pneumothorax.  Probable hepatic cirrhosis with enlarged lateral segment left and caudate lobes  of the liver. Cholelithiasis with small stones in the gallbladder. Prominent calcification in  the superior mesenteric artery and branches of the celiac axis.  Review of the MIP images confirms the above findings.  IMPRESSION: No evidence of significant pulmonary embolus. Cardiac enlargement with passive hepatic congestion and changes of probable hepatic cirrhosis. Mediastinal lymphadenopathy, increasing since previous study. Atelectasis in the lung bases.   Electronically Signed   By: Burman NievesWilliam  Stevens M.D.   On: 03/22/2015 04:28   Dg Chest Port 1 View  03/21/2015   CLINICAL DATA:  Chest pain and shortness of breath since this afternoon. No cough.  EXAM: PORTABLE CHEST - 1 VIEW  COMPARISON:  02/14/2015  FINDINGS: Postoperative changes in the mediastinum. Shallow inspiration. Cardiac enlargement with mild prominence of pulmonary vascular shadow suggesting mild vascular congestion. No focal airspace disease or consolidation. No blunting of costophrenic angles. No pneumothorax. Calcified and tortuous aorta.  IMPRESSION: Cardiac enlargement with suggestion of mild developing pulmonary vascular congestion   Electronically Signed   By: Burman NievesWilliam  Stevens M.D.   On: 03/21/2015 23:03    EKG:   Orders placed or performed during the hospital encounter of 03/21/15  . ED EKG  . ED EKG  . EKG 12-Lead  . EKG 12-Lead    ASSESSMENT AND PLAN:   Assessment/Plan This is a 59 year old male admitted for unstable angina, GI bleed, and urinary tract infection.  1. Unstable angina:  - appreciate cardiology consultation, currently asymptomatic - have stopped eliquis due to gi bleeding - continue medical management Medications adjusted this morning due to hypotension and bradycardia. Isosorbide decreased, hold parameters added. Adding Ranexa   2. GI bleed:  - appriciate gi consultation - painless lower GI bleeding, he is not sure if he has had a colonoscopy - hold eliquis, monitor cbc, which has been fairly  stable  3. Urinary tract infection:  - continue lvaquin - await cultures  4. Diabetes mellitus type 2:  - levimir and SSI  5. Atrial fibrillation:  - rate is controlled - hold  Eliquis  6. Chronic systolic heart failure: No exacerbation at this time. Continue carvedilol and spironolactone 7. Hypertension: soft, hold valsartan 8. DVT prophylaxis: SCD's  in the setting of GI bleeding 9. GI prophylaxis: Proton pump inhibitor     All the records are reviewed and case discussed with Care Management/Social Workerr. Management plans discussed with the patient, family and they are in agreement.  CODE STATUS: full  TOTAL TIME TAKING CARE OF THIS PATIENT: 35 minutes.   POSSIBLE D/C IN 2-3 DAYS, DEPENDING ON CLINICAL CONDITION.   Elby ShowersWALSH, Jameriah Trotti M.D on 03/23/2015 at 4:40 PM  Between 7am to 6pm - Pager - [phone]  After 6pm go to [email or web address] - [email or web address] [facility]  [facility]  Office  [phone]  CC: Primary care physician; Dortha KernBLISS, LAURA K, MD

## 2015-03-23 NOTE — Progress Notes (Signed)
Called MD Clent RidgesWalsh about BP 88/49 and HR 57, do not give AM meds

## 2015-03-23 NOTE — Progress Notes (Signed)
Patient: Tyler LippsMichael D Frank / Admit Date: 03/21/2015 / Date of Encounter: 03/23/2015, 9:49 AM   Subjective: Mild chest pain over night, BP running low, isosorbide started yesterday He is concerned about GI bleeding. Lives at [facility], deconditioned.  Review of Systems: ROS  Review of Systems: Review of Systems  Constitutional: Positive for weight loss and malaise/fatigue. Negative for fever, chills and diaphoresis.  HENT: Negative for hearing loss.  Eyes: Negative for blurred vision, double vision, photophobia, pain, discharge and redness.  Respiratory: Positive for cough, shortness of breath and wheezing. Negative for hemoptysis and sputum production.  Cardiovascular: Positive for chest pain. Negative for palpitations, orthopnea, claudication, leg swelling and PND.  Gastrointestinal: Positive for abdominal pain and melena. Negative for heartburn, nausea, vomiting, diarrhea and blood in stool.  Skin: Positive for rash.  Neurological: Positive for weakness. Negative for headaches.    Objective: Telemetry: NSR Physical Exam: Blood pressure 88/49, pulse 57, temperature 98 F (36.7 C), temperature source Oral, resp. rate 20, height 5\' 7"  (1.702 m), weight 87.272 kg (192 lb 6.4 oz), SpO2 100 %. Body mass index is 30.13 kg/(m^2). General: Well developed, well nourished, in no acute distress. Head: Normocephalic, atraumatic, sclera non-icteric, no xanthomas, nares are without discharge. Neck: Negative for carotid bruits. JVP not elevated. Lungs: Clear bilaterally to auscultation without wheezes, rales, or rhonchi. Breathing is unlabored. Heart: RRR S1 S2 without murmurs, rubs, or gallops.  Abdomen: Soft, non-tender, non-distended with normoactive bowel sounds. No rebound/guarding. Extremities: No clubbing or cyanosis. No edema. Distal pedal pulses are 2+ and equal bilaterally. Neuro: Alert and oriented X 3. Moves all extremities spontaneously. Psych:  Responds to questions  appropriately with a normal affect.   Intake/Output Summary (Last 24 hours) at 03/23/15 0949 Last data filed at 03/22/15 2000  Gross per 24 hour  Intake    240 ml  Output    600 ml  Net   -360 ml    Inpatient Medications:  . gabapentin  300 mg Oral TID  . insulin aspart  0-15 Units Subcutaneous TID WC  . insulin detemir  20 Units Subcutaneous QHS  . isosorbide dinitrate  10 mg Oral TID  . levofloxacin (LEVAQUIN) IV  250 mg Intravenous Q24H  . loratadine  10 mg Oral Daily  . ranolazine  500 mg Oral BID  . sertraline  25 mg Oral Daily  . simvastatin  10 mg Oral QHS   Infusions:    Labs:  Recent Labs  03/21/15 2200  NA 137  K 3.9  CL 100*  CO2 28  GLUCOSE 342*  BUN 30*  CREATININE 1.09  CALCIUM 9.0    Recent Labs  03/21/15 2200  AST 33  ALT 24  ALKPHOS 104  BILITOT 0.6  PROT 6.9  ALBUMIN 3.1*    Recent Labs  03/22/15 1749 03/23/15 0506  WBC 7.4 6.9  HGB 10.8* 10.8*  HCT 34.2* 34.5*  MCV 76.5* 76.3*  PLT 127* 123*    Recent Labs  03/21/15 2200 03/22/15 0230  TROPONINI 0.05* 0.03   Invalid input(s): POCBNP  Recent Labs  03/22/15 0911  HGBA1C 8.4*     Weights: Filed Weights   03/21/15 2208 03/23/15 0502  Weight: 89.359 kg (197 lb) 87.272 kg (192 lb 6.4 oz)     Radiology/Studies:  Ct Angio Chest Pe W/cm &/or Wo Cm  03/22/2015   CLINICAL DATA:  Left-sided chest pain and shortness of breath starting at 3 p.m. yesterday.  EXAM: CT ANGIOGRAPHY  CHEST WITH CONTRAST  TECHNIQUE: Multidetector CT imaging of the chest was performed using the standard protocol during bolus administration of intravenous contrast. Multiplanar CT image reconstructions and MIPs were obtained to evaluate the vascular anatomy.  CONTRAST:  100mL OMNIPAQUE IOHEXOL 350 MG/ML SOLN  COMPARISON:  10/15/2011  FINDINGS: Technically adequate study with good opacification of the central and segmental pulmonary arteries. No focal filling defects are identified. No evidence of  significant pulmonary embolus.  Cardiac enlargement. Normal caliber thoracic aorta. Aortic calcification. Coronary artery calcification with postoperative changes consistent with bypass grafting. Passive congestion of contrast material into the liver may indicate right heart failure. Mediastinal lymphadenopathy with enlarged right pretracheal node measuring up to about 2.2 cm short axis dimension. This is nonspecific but does represent an increase since the previous study. Esophagus is decompressed.  Evaluation of lungs is limited due to respiratory motion artifact. There is evidence of atelectasis in the lung bases. No discrete infiltration. Mild interstitial changes are suggested which may indicate edema. Airways are patent. No pleural effusions. No pneumothorax.  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 Cholelithiasis with small stones in the gallbladder. Prominent calcification in the superior mesenteric artery and branches of the celiac axis.  Review of the MIP images confirms the above findings.    IMPRESSION: No evidence of significant pulmonary embolus. Cardiac enlargement with passive hepatic congestion and changes of probable hepatic cirrhosis. Mediastinal lymphadenopathy, increasing since previous study. Atelectasis in the lung bases.   Electronically Signed   By: Burman NievesWilliam  Stevens M.D.   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  1. Unstable angina/SOB with history of CAD as above: -Last cardiac cath  in 05/2013 was discussed extensively among many cardiologists with the consensus being medical management being the optimal approach for the patient  -Echo 02/15/2015 showed EF 30-35%, mid and apical inferior septum and apex are hypokinetic, no redo cath targets (from cath 05/2013), will not repeat this admission Very low BP yesterday and overnight,  ---will change isosrbide to isosorbide dinitrate 10 mg TID with hold parameters Add ranexa 500 mg po BID, titrate up to 1000 mg po BID in 1 week --No further invasive cardiac workup at this time  2. A-fib with RVR: -Rate controlled, slow overnight. ---Currently not on coreg, would continue to hold b-blocker given he is sedentary, rate is adequate, BP low -With his pressure ulcer and melena may need to discontinue Eliquis for a short period of time  3. History of altered mental status: -Resolved   4. Cellulitis/ulcerations: -Wound care team  5. IDDM: -Per IM, HBA1C elevated  6. PVD: Stable  Signed, Dossie Arbourim Gollan, M.D, Ph.D

## 2015-03-23 NOTE — Progress Notes (Signed)
VSS. 2 L  Of oxygen. A fib. FS are stable. Pt has not reported any pain. Chronic foley. A & o. Takes meds ok. Bedpan. Pt has no futher concerns at this time.

## 2015-03-23 NOTE — Consult Note (Signed)
Pt ate sandwich without problems.  Had brief anterior stabbing chest pain today.  BP 92/73, P 73, T 98.1  Chest clear.  Hgb 10.8, Plt 123K, WBC 6.9.  No new suggestions, continue current plan.

## 2015-03-23 NOTE — Progress Notes (Signed)
Inpatient Diabetes Program Recommendations  AACE/ADA: New Consensus Statement on Inpatient Glycemic Control (2013)  Target Ranges:  Prepandial:   less than 140 mg/dL      Peak postprandial:   less than 180 mg/dL (1-2 hours)      Critically ill patients:  140 - 180 mg/dL   Inpatient Diabetes Program Recommendations Insulin - Basal: Increase Levemir to 25 units  Insulin - Meal Coverage: add Novolog 4 units TID with meals per Glycemic Control order set  Thank you  Piedad ClimesGina Marcelus Dubberly BSN, RN,CDE Inpatient Diabetes Coordinator [phone] (team pager)

## 2015-03-23 NOTE — Clinical Social Work Note (Signed)
Clinical Social Work Assessment  Patient Details  Name: [name] MRN: [id number] Date of Birth: [birth year]  Date of referral:  03/23/15               Reason for consult:   (pt is from a facility.  [facility])                Permission sought to share information with:  Facility Medical sales representativeContact Representative, Family Supports, Case Manager Permission granted to share information::  Yes, Verbal Permission Granted  Name::        Agency::     Relationship::     Contact Information:     Housing/Transportation Living arrangements for the past 2 months:  Skilled Nursing Facility (pt is currently staying at a SNF for rehab only.  He is not a resident.) Source of Information:  Patient, Facility Patient Interpreter Needed:  None Criminal Activity/Legal Involvement Pertinent to Current Situation/Hospitalization:  No - Comment as needed Significant Relationships:  Friend, Other Family Members Lives with:  Self Do you feel safe going back to the place where you live?  No (pt feels that he needs to contunue his rehab at [facility] before he returns home) Need for family participation in patient care:  No (Coment)  Care giving concerns:  None at this time.  Pt's current plan is to DC back [facility] to continue his rehab.   Social Worker assessment / plan:  Current DC plans are for pt to [facility]  Employment status:  Disabled (Comment on whether or not currently receiving Disability) Insurance information:  Medicare PT Recommendations:  Skilled Nursing Facility Information / Referral to community resources:     Patient/Family's Response to care:  Pt [facility] in [facility]  Patient/Family's Understanding of and Emotional Response to Diagnosis, Current Treatment, and Prognosis:  Pt thanked CSW for speaking with him about return to SNF.  Emotional Assessment Appearance:  Appears older than stated  age Attitude/Demeanor/Rapport:   (pleasent) Affect (typically observed):  Accepting Orientation:  Oriented to Self, Oriented to Place, Oriented to  Time, Oriented to Situation Alcohol / Substance use:    Psych involvement (Current and /or in the community):  No (Comment)  Discharge Needs  Concerns to be addressed:    Readmission within the last 30 days:    Current discharge risk:    Barriers to Discharge:      Chauncy PassyBennerson, Lyndsie Wallman J, LCSW 03/23/2015, 12:03 PM

## 2015-03-24 LAB — BASIC METABOLIC PANEL
ANION GAP: 9 (ref 5–15)
BUN: 63 mg/dL — ABNORMAL HIGH (ref 6–20)
CALCIUM: 9 mg/dL (ref 8.9–10.3)
CO2: 28 mmol/L (ref 22–32)
Chloride: 99 mmol/L — ABNORMAL LOW (ref 101–111)
Creatinine, Ser: 2.07 mg/dL — ABNORMAL HIGH (ref 0.61–1.24)
GFR calc non Af Amer: 33 mL/min — ABNORMAL LOW (ref >60)
GFR, EST AFRICAN AMERICAN: 38 mL/min — AB (ref >60)
GLUCOSE: 277 mg/dL — AB (ref 65–99)
POTASSIUM: 4.7 mmol/L (ref 3.5–5.1)
SODIUM: 136 mmol/L (ref 135–145)

## 2015-03-24 LAB — CBC
HCT: 35.4 % — ABNORMAL LOW (ref 40.0–52.0)
Hemoglobin: 11.1 g/dL — ABNORMAL LOW (ref 13.0–18.0)
MCH: 23.7 pg — ABNORMAL LOW (ref 26.0–34.0)
MCHC: 31.2 g/dL — ABNORMAL LOW (ref 32.0–36.0)
MCV: 76.1 fL — ABNORMAL LOW (ref 80.0–100.0)
Platelets: 133 10*3/uL — ABNORMAL LOW (ref 150–440)
RBC: 4.66 MIL/uL (ref 4.40–5.90)
RDW: 20 % — ABNORMAL HIGH (ref 11.5–14.5)
WBC: 7.8 10*3/uL (ref 3.8–10.6)

## 2015-03-24 LAB — GLUCOSE, CAPILLARY
Glucose-Capillary: 264 mg/dL — ABNORMAL HIGH (ref 70–99)
Glucose-Capillary: 266 mg/dL — ABNORMAL HIGH (ref 70–99)
Glucose-Capillary: 176 mg/dL — ABNORMAL HIGH (ref 70–99)
Glucose-Capillary: 310 mg/dL — ABNORMAL HIGH (ref 70–99)

## 2015-03-24 LAB — MAGNESIUM: MAGNESIUM: 1.9 mg/dL (ref 1.7–2.4)

## 2015-03-24 MED ORDER — INSULIN DETEMIR 100 UNIT/ML ~~LOC~~ SOLN
23.0000 [IU] | Freq: Every day | SUBCUTANEOUS | Status: DC
  Administered 2015-03-24: 23 [IU] via SUBCUTANEOUS
  Filled 2015-03-24 (×2): qty 0.23

## 2015-03-24 MED ORDER — RANOLAZINE ER 500 MG PO TB12
500.0000 mg | ORAL_TABLET | Freq: Two times a day (BID) | ORAL | Status: DC
  Administered 2015-03-24 – 2015-03-25 (×3): 500 mg via ORAL
  Filled 2015-03-24 (×3): qty 1

## 2015-03-24 NOTE — Progress Notes (Signed)
Patient: [name] / Admit Date: 03/21/2015 / Date of Encounter: 03/24/2015, 10:35 AM   Subjective: Chest pain from prior day improved. Mild SOB this AM, improved with laying supine. Blood pressure has been somewhat soft with the addition of Ranexa and changing isosorbide mononitrate to isosorbide dinitrate 10 mg tid. SCr bumped this morning to 2.07 from 1.09.     Review of Systems: Review of Systems  Constitutional: Positive for weight loss and malaise/fatigue. Negative for fever, chills and diaphoresis.  Eyes: Negative for discharge and redness.  Respiratory: Positive for cough and shortness of breath. Negative for hemoptysis, sputum production and wheezing.   Cardiovascular: Positive for chest pain and leg swelling. Negative for palpitations, orthopnea, claudication and PND.  Gastrointestinal: Positive for abdominal pain and melena. Negative for nausea and vomiting.  Skin: Positive for rash.  Neurological: Positive for weakness.    Objective: Telemetry: a-fib, 70s Physical Exam: Blood pressure 98/63, pulse 79, temperature 98 F (36.7 C), temperature source Oral, resp. rate 16, height  (1.702 m), weight 194 lb 12.8 oz (88.361 kg), SpO2 99 %. Body mass index is 30.5 kg/(m^2). General: Well developed, well nourished, in no acute distress. Head: Normocephalic, atraumatic, sclera non-icteric, no xanthomas, nares are without discharge. Neck: Negative for carotid bruits. JVP not elevated. Lungs: Clear bilaterally to auscultation without wheezes, rales, or rhonchi. Breathing is unlabored. Heart: RRR S1 S2 without murmurs, rubs, or gallops.  Abdomen: Soft, non-tender, non-distended with normoactive bowel sounds. No rebound/guarding. Extremities: No clubbing or cyanosis. No edema. Right BKA.  Neuro: Alert and oriented X 3. Moves all extremities spontaneously. Psych:  Responds to questions appropriately with a normal affect.   Intake/Output Summary (Last 24 hours) at 03/24/15  1035 Last data filed at 03/24/15 0512  Gross per 24 hour  Intake    480 ml  Output    800 ml  Net   -320 ml    Inpatient Medications:  . gabapentin  300 mg Oral TID  . insulin aspart  0-15 Units Subcutaneous TID WC  . insulin detemir  23 Units Subcutaneous QHS  . isosorbide dinitrate  10 mg Oral TID  . levofloxacin (LEVAQUIN) IV  250 mg Intravenous Q24H  . loratadine  10 mg Oral Daily  . ranolazine  500 mg Oral BID  . sertraline  25 mg Oral Daily  . simvastatin  10 mg Oral QHS   Infusions:    Labs:  Recent Labs  03/21/15 2200 03/24/15 0339  NA 137 136  K 3.9 4.7  CL 100* 99*  CO2 28 28  GLUCOSE 342* 277*  BUN 30* 63*  CREATININE 1.09 2.07*  CALCIUM 9.0 9.0  MG  --  1.9    Recent Labs  03/21/15 2200  AST 33  ALT 24  ALKPHOS 104  BILITOT 0.6  PROT 6.9  ALBUMIN 3.1*    Recent Labs  03/23/15 1652 03/24/15 0339  WBC 7.6 7.8  HGB 12.0* 11.1*  HCT 38.1* 35.4*  MCV 76.5* 76.1*  PLT 128* 133*    Recent Labs  03/21/15 2200 03/22/15 0230  TROPONINI 0.05* 0.03   Invalid input(s): POCBNP  Recent Labs  03/22/15 0911  HGBA1C 8.4*     Weights: Filed Weights   03/21/15 2208 03/23/15 0502 03/24/15 0512  Weight: 197 lb (89.359 kg) 192 lb 6.4 oz (87.272 kg) 194 lb 12.8 oz (88.361 kg)     Radiology/Studies:  Ct Angio Chest Pe W/cm &/or Wo Cm  03/22/2015  CLINICAL DATA:  Left-sided chest pain and shortness of breath starting at 3 p.m. yesterday.  EXAM: CT ANGIOGRAPHY CHEST WITH CONTRAST  TECHNIQUE: Multidetector CT imaging of the chest was performed using the standard protocol during bolus administration of intravenous contrast. Multiplanar CT image reconstructions and MIPs were obtained to evaluate the vascular anatomy.  CONTRAST:  100mL OMNIPAQUE IOHEXOL 350 MG/ML SOLN  COMPARISON:  10/15/2011  FINDINGS: Technically adequate study with good opacification of the central and segmental pulmonary arteries. No focal filling defects are identified. No evidence  of significant pulmonary embolus.  Cardiac enlargement. Normal caliber thoracic aorta. Aortic calcification. Coronary artery calcification with postoperative changes consistent with bypass grafting. Passive congestion of contrast material into the liver may indicate right heart failure. Mediastinal lymphadenopathy with enlarged right pretracheal node measuring up to about 2.2 cm short axis dimension. This is nonspecific but does represent an increase since the previous study. Esophagus is decompressed.  Evaluation of lungs is limited due to respiratory motion artifact. There is evidence of atelectasis in the lung bases. No discrete infiltration. Mild interstitial changes are suggested which may indicate edema. Airways are patent. No pleural effusions. No pneumothorax.  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 Cholelithiasis with small stones in the gallbladder. Prominent calcification in the superior mesenteric artery and branches of the celiac axis.  Review of the MIP images confirms the above findings.  IMPRESSION: No evidence of significant pulmonary embolus. Cardiac enlargement with passive hepatic congestion and changes of probable hepatic cirrhosis. Mediastinal lymphadenopathy, increasing since previous study. Atelectasis in the lung bases.   Electronically Signed   By: Burman NievesWilliam  Stevens M.D.   On: 03/22/2015 04:28   Dg Chest Port 1 View  03/21/2015   CLINICAL DATA:  Chest pain and shortness of breath since this afternoon. No cough.  EXAM: PORTABLE CHEST - 1 VIEW  COMPARISON:  02/14/2015  FINDINGS: Postoperative changes in the mediastinum. Shallow inspiration. Cardiac enlargement with mild prominence of pulmonary vascular shadow suggesting mild vascular congestion. No focal airspace disease or consolidation. No blunting of costophrenic angles. No pneumothorax. Calcified and tortuous aorta.  IMPRESSION: Cardiac enlargement with suggestion of mild developing  pulmonary vascular congestion   Electronically Signed   By: Burman NievesWilliam  Stevens M.D.   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  1. Unstable angina/SOB with history of CAD as above: -Last cardiac cath in 05/2013 was discussed extensively among many cardiologists with the consensus being medical management being the optimal approach for the patient  -Echo 02/15/2015 showed EF 30-35%, mid and apical inferior septum and apex are hypokinetic, no redo cath targets (from cath 05/2013), will not repeat this admission -Continued labile BP  -Continue Ranexa 500 mg bid  -Hold Isordil given soft blood pressures and bump in SCr (possible ATN) -No further invasive cardiac workup at this time  2. A-fib with RVR: -Rate controlled, slow overnight -Currently not on coreg, would continue to hold b-blocker given he is sedentary, rate is adequate, BP low -With his pressure ulcer and melena  may need to discontinue Eliquis for a short period of time  3. Acute renal injury: -Possibly 2/2 ATN in the setting of his hypotension and infection -Hold Isordil as above  3. History of altered mental status: -Resolved   4. Cellulitis/ulcerations: -Wound care team  5. IDDM: -Per IM, HBA1C elevated  6. PVD: Stable   Signed, Eula ListenRyan Samona Chihuahua, PA-C 03/24/2015 10:46 AM

## 2015-03-24 NOTE — Progress Notes (Signed)
[facility] [place] [facility]   PATIENT NAME: Hendricks MiloMichael Badour    MR#:  [id number]  DATE OF BIRTH:  [birth year]  SUBJECTIVE:  No further bleeding or abdominal pain. Heart rate improved. Slightly hypotensive. Did have an episode of shortness of breath this morning but was not hypoxic at that time.  REVIEW OF SYSTEMS:  Review of Systems  Constitutional: Negative for fever.  Respiratory: Positive for cough and shortness of breath.   Cardiovascular: Negative for chest pain and leg swelling.  Gastrointestinal: Negative for heartburn and abdominal pain.  Genitourinary: Negative for dysuria.     DRUG ALLERGIES:  No Known Allergies  VITALS:  Blood pressure 99/51, pulse 81, temperature 98.2 F (36.8 C), temperature source Oral, resp. rate 18, height 5\' 7"  (1.702 m), weight 88.361 kg (194 lb 12.8 oz), SpO2 96 %.  PHYSICAL EXAMINATION:  Physical Exam  Constitutional: He is oriented to person, place, and time. He appears well-developed and well-nourished.  HENT:  Head: Normocephalic and atraumatic.  Eyes: EOM are normal. Pupils are equal, round, and reactive to light.  Neck: No JVD present. No thyromegaly present.  Cardiovascular: Normal rate and regular rhythm.   No murmur heard. Pulmonary/Chest: Effort normal and breath sounds normal.  Abdominal: Soft. He exhibits no distension. There is no tenderness. There is no guarding.  Neurological: He is oriented to person, place, and time.  Skin: Skin is warm and dry.  Psychiatric: He has a normal mood and affect.     LABORATORY PANEL:   CBC  Recent Labs Lab 03/24/15 0339  WBC 7.8  HGB 11.1*  HCT 35.4*  PLT 133*   ------------------------------------------------------------------------------------------------------------------  Chemistries   Recent Labs Lab 03/21/15 2200 03/24/15 0339  NA 137 136  K 3.9 4.7  CL 100* 99*  CO2 28 28  GLUCOSE 342* 277*  BUN 30* 63*  CREATININE 1.09 2.07*   CALCIUM 9.0 9.0  MG  --  1.9  AST 33  --   ALT 24  --   ALKPHOS 104  --   BILITOT 0.6  --    ------------------------------------------------------------------------------------------------------------------  Cardiac Enzymes  Recent Labs Lab 03/22/15 0230  TROPONINI 0.03   ------------------------------------------------------------------------------------------------------------------  RADIOLOGY:  No results found.  EKG:   Orders placed or performed during the hospital encounter of 03/21/15  . ED EKG  . ED EKG  . EKG 12-Lead  . EKG 12-Lead    ASSESSMENT AND PLAN:   Assessment/Plan This is a 59 year old male admitted for unstable angina, GI bleed, and urinary tract infection.  1. Unstable angina: Improved - appreciate cardiology consultation, currently asymptomatic - have stopped eliquis due to gi bleeding, we will resume after 1 week without bleeding - continue medical management, has been hypotensive on Ranexa and isosorbide, these are being held and adjusted  2. GI bleed:  - appriciate gi consultation - No further bleeding. Hemoglobin stable. Holding eliquis for now   3. Urinary tract infection:  - continue levaquin day 3  4. Diabetes mellitus type 2:  - levimir and SSI She's had been elevated. Increasing dose of Levemir to 23 units  5. Atrial fibrillation:  - rate is controlled - holding  Eliquis  6. Chronic systolic heart failure: No exacerbation at this time. Have held carvedilol and spironolactone due to bradycardia and hypotension  7. Hypertension: soft, hold valsartan  8. DVT prophylaxis: SCD's  in the setting of GI bleeding  9. GI prophylaxis: Proton pump inhibitor  #10 disposition: We'll have physical  therapy reassess. I think he is getting ready for discharge and may be ready as early as tomorrow to go back to skilled nursing  All the records are reviewed and case discussed with Care Management/Social Workerr. Management plans discussed  with the patient, family and they are in agreement.  CODE STATUS: full  TOTAL TIME TAKING CARE OF THIS PATIENT: 35 minutes.   POSSIBLE D/C IN one DAYS, DEPENDING ON CLINICAL CONDITION.   Elby ShowersWALSH, Jacklin Zwick M.D on 03/24/2015 at 12:39 PM  Between 7am to 6pm - Pager - [phone]  After 6pm go to [email or web address] - password [email or web address] [facility]  [facility]  Office  [phone]  CC: Primary care physician; Dortha KernBLISS, LAURA K, MD

## 2015-03-24 NOTE — Plan of Care (Signed)
Problem: Phase II Progression Outcomes Goal: Begin discharge teaching Outcome: Progressing Pt is alert and oriented x 4, pt denies pain, receiving scheduled gabapentin, physical therapy worked with patient, good appetite, foley cathter in place, wound on coccyx and left leg changed, wound consult entered, pt continues on room air, consulted by cardiology, FSBS remain elevated. Afebrile throughout shift, slightly hypotensive, MD aware.

## 2015-03-24 NOTE — Consult Note (Signed)
Pt doing well, cleaned his plate.  No GI symptoms.  BP 99/51, T 98.2, P81, hgb 11, plt 133, creat 2.  I see no reason to continue to follow him and will sign off.  Due to cardiac hx no EGD recommended.  You may get an UGI if feel necessary.

## 2015-03-24 NOTE — Progress Notes (Signed)
Inpatient Diabetes Program Recommendations  AACE/ADA: New Consensus Statement on Inpatient Glycemic Control (2013)  Target Ranges:  Prepandial:   less than 140 mg/dL      Peak postprandial:   less than 180 mg/dL (1-2 hours)      Critically ill patients:  140 - 180 mg/dL   Results for [name], [name] (MRN [id number]) as of 03/24/2015 08:42  Ref. Range 03/23/2015 07:32 03/23/2015 11:41 03/23/2015 16:11 03/23/2015 20:57 03/24/2015 07:28  Glucose-Capillary Latest Ref Range: 70-99 mg/dL 562179 (H) 130232 (H) 865196 (H) 261 (H) 264 (H)   Diabetes history: DM2 Outpatient Diabetes medications: Levemir 20 units QHS Current orders for Inpatient glycemic control: Levemir 20 units QHS, Novolog 0-15 units TID with meals  Inpatient Diabetes Program Recommendations Insulin - Basal: Fasting glucose is 264 mg/dl this morning. Please consider increasing Lantus to 23 untis QHS. Correction (SSI): Glucose was 261 mg/dl at bedtime last night but no insulin given for correction since there are no orders for bedtime insulin correction. Please consider ordering Novolog bedtime correction scale. Insulin - Meal Coverage: Post prandial glucose is consistently elevated. According to the chart, patient is eating 100% of meals. Please consider ordering Novolog 4 units TID with meals for meal coverage (in addition to Novolog correction).  Thanks, Orlando PennerMarie Demetrice Amstutz, RN, MSN, CCRN, CDE Diabetes Coordinator Inpatient Diabetes Program [phone] (Team Pager from 8am to 5pm) [phone] (AP office) [phone] [facility] office)

## 2015-03-24 NOTE — Progress Notes (Signed)
Notify Dr. Clent RidgesWalsh that patietn has stage 3 on coccyx that has slough present, MD will enter wound consult

## 2015-03-24 NOTE — Evaluation (Signed)
Physical Therapy Evaluation Patient Details Name: [name] MRN: [id number] DOB: Jul 02, 1956 Today's Date: 03/24/2015   History of Present Illness  Pt has current history of elevated creatinine, high BS, elevated troponin, ateletasis, UTI, low BP and unstable angina with chest pain.    Clinical Impression  Pt was seen for initiating mobility with good vitals with the effort.  He will be seen for follow up of this visit to increase his ability to help stand, increase LE strength and to facilitiate his return to SNF for restoration of standing transfers to the chair.  Pt is motivated and will hopefully do well returning to his PLOF.  Has been in SNF for last 2 months and will need to have at least another month to recover.    Follow Up Recommendations SNF    Equipment Recommendations  None recommended by PT (await SNF disposition)    Recommendations for Other Services       Precautions / Restrictions Precautions Precautions: Fall;Other (comment) (Telemetry) Restrictions Weight Bearing Restrictions: Yes Other Position/Activity Restrictions: WBAT      Mobility  Bed Mobility Overal bed mobility: Needs Assistance Bed Mobility: Rolling;Supine to Sit;Sit to Supine Rolling: Min assist   Supine to sit: Mod assist Sit to supine: Mod assist   General bed mobility comments: usign HOB elevated and bedrails to assist PT in sitting him up  Transfers Overall transfer level: Needs assistance   Transfers: Lateral/Scoot Transfers          Lateral/Scoot Transfers: Total assist General transfer comment: Pt has not regained control of sitting balance and is just recently working on standing in SNF.  However hs been in bed at hosp and cannot stand  Ambulation/Gait             General Gait Details: unable  Stairs            Wheelchair Mobility    Modified Rankin (Stroke Patients Only)       Balance Overall balance assessment: Needs assistance   Sitting  balance-Leahy Scale: Poor       Standing balance-Leahy Scale: Zero                               Pertinent Vitals/Pain Pain Assessment: No/denies pain    Home Living Family/patient expects to be discharged to:: Skilled nursing facility                      Prior Function Level of Independence: Independent with assistive device(s)               Hand Dominance        Extremity/Trunk Assessment   Upper Extremity Assessment: Overall WFL for tasks assessed           Lower Extremity Assessment: Generalized weakness      Cervical / Trunk Assessment: Normal;Other exceptions (weakness in abd mm's)  Communication   Communication: No difficulties  Cognition Arousal/Alertness: Awake/alert Behavior During Therapy: WFL for tasks assessed/performed Overall Cognitive Status: Within Functional Limits for tasks assessed                      General Comments General comments (skin integrity, edema, etc.): Pt is low in energy, and to test BP did supine readings of 109/54 with pulse 82, sitting readings of 107/59 and pulse 81.    Exercises        Assessment/Plan  PT Assessment Patient needs continued PT services  PT Diagnosis Difficulty walking;Generalized weakness   PT Problem List Decreased strength;Decreased range of motion;Decreased activity tolerance;Decreased balance;Decreased mobility;Decreased coordination;Cardiopulmonary status limiting activity;Obesity;Decreased skin integrity  PT Treatment Interventions DME instruction;Gait training;Functional mobility training;Therapeutic activities;Therapeutic exercise;Balance training;Neuromuscular re-education;Cognitive remediation;Patient/family education   PT Goals (Current goals can be found in the Care Plan section) Acute Rehab PT Goals Patient Stated Goal: to get home safely PT Goal Formulation: With patient Time For Goal Achievement: 04/07/15 Potential to Achieve Goals: Good     Frequency Min 2X/week   Barriers to discharge Inaccessible home environment;Decreased caregiver support home with insufficient help to succeed    Co-evaluation               End of Session Equipment Utilized During Treatment: Oxygen Activity Tolerance: Patient tolerated treatment well;Patient limited by fatigue Patient left: in bed;with call bell/phone within reach;with bed alarm set Nurse Communication: Mobility status         Time: [id number] PT Time Calculation (min) (ACUTE ONLY): 23 min   Charges:   PT Evaluation $Initial PT Evaluation Tier I: 1 Procedure PT Treatments $Therapeutic Activity: 8-22 mins   PT G Codes:        Tyler DrapeStout, [name] 03/24/2015, 6:12 PM   Samul Dadauth Fernande Treiber, PT MS Acute Rehab Dept. Number: [facility] [id number] and [facility] [phone]

## 2015-03-25 ENCOUNTER — Telehealth: Payer: Self-pay

## 2015-03-25 LAB — BASIC METABOLIC PANEL
ANION GAP: 7 (ref 5–15)
BUN: 69 mg/dL — ABNORMAL HIGH (ref 6–20)
CALCIUM: 9.3 mg/dL (ref 8.9–10.3)
CO2: 28 mmol/L (ref 22–32)
CREATININE: 1.78 mg/dL — AB (ref 0.61–1.24)
Chloride: 99 mmol/L — ABNORMAL LOW (ref 101–111)
GFR calc non Af Amer: 40 mL/min — ABNORMAL LOW (ref >60)
GFR, EST AFRICAN AMERICAN: 46 mL/min — AB (ref >60)
Glucose, Bld: 269 mg/dL — ABNORMAL HIGH (ref 65–99)
Potassium: 4.8 mmol/L (ref 3.5–5.1)
SODIUM: 134 mmol/L — AB (ref 135–145)

## 2015-03-25 LAB — CBC
HCT: 38.4 % — ABNORMAL LOW (ref 40.0–52.0)
Hemoglobin: 11.9 g/dL — ABNORMAL LOW (ref 13.0–18.0)
MCH: 23.7 pg — ABNORMAL LOW (ref 26.0–34.0)
MCHC: 31.1 g/dL — ABNORMAL LOW (ref 32.0–36.0)
MCV: 76.1 fL — ABNORMAL LOW (ref 80.0–100.0)
PLATELETS: 131 10*3/uL — AB (ref 150–440)
RBC: 5.04 MIL/uL (ref 4.40–5.90)
RDW: 20.1 % — AB (ref 11.5–14.5)
WBC: 6.8 10*3/uL (ref 3.8–10.6)

## 2015-03-25 LAB — GLUCOSE, CAPILLARY
Glucose-Capillary: 244 mg/dL — ABNORMAL HIGH (ref 65–99)
Glucose-Capillary: 225 mg/dL — ABNORMAL HIGH (ref 65–99)

## 2015-03-25 MED ORDER — LEVOFLOXACIN 250 MG PO TABS: 250.0000 mg | ORAL_TABLET | Freq: Every day | ORAL | Status: DC

## 2015-03-25 MED ORDER — INSULIN ASPART 100 UNIT/ML ~~LOC~~ SOLN: 0.0000 [IU] | Freq: Three times a day (TID) | SUBCUTANEOUS | Status: AC

## 2015-03-25 MED ORDER — RANOLAZINE ER 500 MG PO TB12: 500.0000 mg | ORAL_TABLET | Freq: Two times a day (BID) | ORAL | Status: DC

## 2015-03-25 MED ORDER — INSULIN DETEMIR 100 UNIT/ML ~~LOC~~ SOLN: 23.0000 [IU] | Freq: Every day | SUBCUTANEOUS | Status: AC

## 2015-03-25 NOTE — Plan of Care (Signed)
Problem: Consults Goal: Diabetes Guidelines if Diabetic/Glucose > 140 If diabetic or lab glucose is > 140 mg/dl - Initiate Diabetes/Hyperglycemia Guidelines & Document Interventions  Patient has no acute event overnight. He denied N&V, and received PRN pain med X1. Patient denied pain post pain med admin.

## 2015-03-25 NOTE — Telephone Encounter (Signed)
Attempt[facility] to contact [facility] on 03/25/15. Pt's home # is not accepting calls at this time.  Left message on mobile vm asking pt to call back w/ any questions or concerns regarding medications or discharge instructions.  Advised him of appt w/ Dr. Kirke CorinArida 04/05/15 @ 2:00. Asked him to call back if he is unable to keep this appt.

## 2015-03-25 NOTE — Telephone Encounter (Signed)
-----   Message from Coralee RudSabrina F Gilley sent at 03/25/2015  9:25 AM EDT ----- Regarding: tcm/ph 04/05/2015 2:00 Dr. Kirke CorinArida

## 2015-03-25 NOTE — Clinical Social Work Note (Signed)
CSW notified facility, and RN that pt would DC back to [facility] to continue his rehab.  CSW also updated FL2 to include wound appointment.  CSW signing off.

## 2015-03-25 NOTE — Discharge Summary (Addendum)
[facility] [place] [facility]  DISCHARGE SUMMARY   PATIENT NAME: [name]    MR#:  [id number]  DATE OF BIRTH:  Oct 06, 1956  DATE OF ADMISSION:  03/21/2015 ADMITTING PHYSICIAN: Arnaldo Natal, MD  DATE OF DISCHARGE: 03/25/2015  PRIMARY CARE PHYSICIAN: Dortha Kern, MD    ADMISSION DIAGNOSIS:  Pleuritic chest pain [R07.81] Rectal bleeding [K62.5] Chronic indwelling Foley catheter [Z98.89] Urinary tract infection associated with catheterization of urinary tract, initial encounter [T83.51XA, N39.0]  DISCHARGE DIAGNOSIS:  Principal Problem:   GI bleed Active Problems:   CAD (coronary artery disease), autologous vein bypass graft   SECONDARY DIAGNOSIS:   Past Medical History  Diagnosis Date  . HTN (hypertension)   . 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 Anxiety   . Headache(784.0)   . NSTEMI (non-ST elevated myocardial infarction) 10/17/2011  . Diabetes mellitus   . Pulmonary emphysema   . Obesity   . Paroxysmal atrial flutter 10/15/2011  . CAD (coronary artery disease), autologous vein bypass graft     s/p CABG in 80's or 90's, multiple caths/PCI. Presented in June of 2014 with non-ST elevation myocardial infarction. Cardiac catheterization done by Dr. Fransico Keller at [facility] showed severe three-vessel coronary artery disease (nondominant RCA )with patent LIMA to LAD, occluded SVG to OM1(new) and occluded SVG to PDA. There was significant ostial and distal left main stenosis. Attempted left main/LCX PCI  . MI (myocardial infarction)   . Chronic systolic heart failure     Ejection fraction 35%  . Broken femur   . Chronic indwelling Foley catheter   . CHF (congestive heart failure)     HOSPITAL COURSE:   This is a 59 year old male admitted for unstable angina, GI bleed, and urinary tract infection.   1. Unstable angina with history of coronary artery disease:  Improved He was followed throughout the hospitalization by cardiology. Ranexa added which seems to have helped with anginal symptoms. Antihypertensives adjusted due to hypotension and bradycardia which may have contributed to the angina. At this time we are holding Coreg, losartan and spironolactone. He was started on Imdur during hospitalization but is was discontinued also due to hypotension.  2. GI bleed:  Followed by Dr. Mechele Collin through the hospitalization. Hemoglobin remained stable. Bleeding resolved without intervention. Both aspirin and eliquis were held during the hospitalization. He can resume aspirin and eliquis at this time.  3. Urinary tract infection:  UA on admission shows 6-30 white blood cells and positive leukocyte esterase. Unfortunately no cultures drawn. He was treated with IV Levaquin for 4 days while inpatient. He will continue on by mouth Levaquin for an additional 3 days to complete a seven-day course. He does have a chronic indwelling Foley catheter due to sacral decubitus ulcer and incontinence of urine. This catheter was changed on admission. Catheter should be removed as soon as possible.  4. Diabetes mellitus type 2:  Hemoglobin A1c checked during this hospitalization is elevated at 8.4. His daily dose of Levemir was increased to 23 units. He should be on sliding scale at the skilled nursing facility. Further doses to basal insulin will be made at skilled nursing  5. Atrial fibrillation: Rate has been controlled to low. Actually his beta blocker was stopped during hospitalization due to bradycardia. He continues on aspirin and eliquis.  6. Chronic systolic heart failure with ejection fraction 30-35%: No exacerbation during this hospitalization. Will need to  monitor carefully as we have held his spironolactone and Coreg. Needs to continue on low sodium diet and fluid restriction.  7. Hypertension: He was hypotensive throughout much of this hospitalization. I have held  his Coreg, spironolactone and losartan. Blood pressure should be monitored daily and medications adjusted as needed  #8 deconditioning: Seen by physical therapy during this admission and skilled nursing facility is advised.  #9: Sacral decubitus ulcer and lower extremity ulcers: Wound care consultation ordered, but unable to see during the admission. Will follow up outpatient with wound care clinic.  DISCHARGE CONDITIONS:   Fair  CONSULTS OBTAINED:  Treatment Team:  Iran OuchMuhammad A Arida, MD Scot Junobert T Elliott, MD  DRUG ALLERGIES:  No Known Allergies  DISCHARGE MEDICATIONS:   Current Discharge Medication List    START taking these medications   Details  insulin aspart (NOVOLOG) 100 UNIT/ML injection Inject 0-15 Units into the skin 3 (three) times daily with meals. Qty: 10 mL, Refills: 11    levofloxacin (LEVAQUIN) 250 MG tablet Take 1 tablet (250 mg total) by mouth daily. Qty: 7 tablet, Refills: 0    ranolazine (RANEXA) 500 MG 12 hr tablet Take 1 tablet (500 mg total) by mouth 2 (two) times daily. Qty: 30 tablet, Refills: 0      CONTINUE these medications which have CHANGED   Details  insulin detemir (LEVEMIR) 100 UNIT/ML injection Inject 0.23 mLs (23 Units total) into the skin daily. Qty: 10 mL, Refills: 11      CONTINUE these medications which have NOT CHANGED   Details  citalopram (CELEXA) 10 MG tablet Take 10 mg by mouth daily.    gabapentin (NEURONTIN) 300 MG capsule Take 300 mg by mouth 3 (three) times daily.    nitroGLYCERIN (NITROSTAT) 0.4 MG SL tablet Place 1 tablet (0.4 mg total) under the tongue every 5 (five) minutes as needed for chest pain. Qty: 25 tablet, Refills: 3    sertraline (ZOLOFT) 25 MG tablet Take 25 mg by mouth daily.    apixaban (ELIQUIS) 5 MG TABS tablet Take 1 tablet (5 mg total) by mouth 2 (two) times daily. Qty: 60 tablet, Refills: 6    aspirin EC 81 MG tablet Take 1 tablet (81 mg total) by mouth daily. Qty: 90 tablet, Refills: 3     cetirizine (ZYRTEC) 10 MG tablet Take 10 mg by mouth daily.    simvastatin (ZOCOR) 10 MG tablet Take 10 mg by mouth at bedtime.    traMADol (ULTRAM) 50 MG tablet Take 50 mg by mouth every 6 (six) hours as needed.       STOP taking these medications     insulin glargine (LANTUS) 100 UNIT/ML injection      carvedilol (COREG) 12.5 MG tablet      losartan (COZAAR) 100 MG tablet      spironolactone (ALDACTONE) 25 MG tablet          DISCHARGE INSTRUCTIONS:    DIET:  Cardiac diet and 2 g sodium with fluid restriction  DISCHARGE CONDITION:  Fair  ACTIVITY:  Activity as tolerated  OXYGEN:  Home Oxygen: No.   Oxygen Delivery: room air  DISCHARGE LOCATION:  nursing home   If you experience worsening of your admission symptoms, develop shortness of breath, life threatening emergency, suicidal or homicidal thoughts you must seek medical attention immediately by calling 911 or calling your MD immediately  if symptoms less severe.  You Must read complete instructions/literature along with all the possible adverse reactions/side effects for all the Medicines  you take and that have been prescribed to you. Take any new Medicines after you have completely understood and accpet all the possible adverse reactions/side effects.   Please note  You were cared for by a hospitalist during your hospital stay. If you have any questions about your discharge medications or the care you received while you were in the hospital after you are discharged, you can call the unit and asked to speak with the hospitalist on call if the hospitalist that took care of you is not available. Once you are discharged, your primary care physician will handle any further medical issues. Please note that NO REFILLS for any discharge medications will be authorized once you are discharged, as it is imperative that you return to your primary care physician (or establish a relationship with a primary care physician if  you do not have one) for your aftercare needs so that they can reassess your need for medications and monitor your lab values.   Today   CHIEF COMPLAINT:   Chief Complaint  Patient presents with  . Chest Pain    chest and abd pain    HISTORY OF PRESENT ILLNESS:   Chief Complaint: Chest pain HPI: The patient presents to the emergency department complaining of chest pain that began over his left chest. It began at rest and radiated to his left arm. He denied any nausea or vomiting but admitted to some shortness of breath and lightheadedness. At the time of my interview the patient states that he feels better and the chest pain is resolved. His initial assessment by the emergency department staff revealed that he has a wound on his sacrum but also that he has freely oozing dark red blood mixed with some melena from his rectum. The patient states this is painless and that he was unaware he had any bleeding at this time although he admits he has had some blood in his stool previously. CT angiogram of the chest ruled out pulmonary embolism as a cause of pain. Further evaluation in the emergency department revealed urinary tract infection due to indwelling Foley catheter. Due to his chest pain, history of coronary artery disease, GI bleeding, and urinary tract infection the emergency department staff called for admission to the hospital.   VITAL SIGNS:  Blood pressure 114/57, pulse 79, temperature 98.4 F (36.9 C), temperature source Oral, resp. rate 18, height 5\' 7"  (1.702 m), weight 88.406 kg (194 lb 14.4 oz), SpO2 96 %.  I/O:    Intake/Output Summary (Last 24 hours) at 03/25/15 0930 Last data filed at 03/25/15 0533  Gross per 24 hour  Intake    580 ml  Output   1750 ml  Net  -1170 ml    PHYSICAL EXAMINATION:  GENERAL:  59 y.o.-year-old patient lying in the bed with no acute distress.  EYES: Pupils equal, round, reactive to light and accommodation. No scleral icterus. Extraocular  muscles intact.  HEENT: Head atraumatic, normocephalic. Oropharynx and nasopharynx clear.  NECK:  Supple, no jugular venous distention. No thyroid enlargement, no tenderness.  LUNGS: Normal breath sounds bilaterally, no wheezing, rales,rhonchi or crepitation. No use of accessory muscles of respiration.  CARDIOVASCULAR: S1, S2 normal. No murmurs, rubs, or gallops.  ABDOMEN: Soft, non-tender, non-distended. Bowel sounds present. No organomegaly or mass.  EXTREMITIES: No pedal edema, cyanosis, or clubbing. Status post right BKA NEUROLOGIC: Cranial nerves II through XII are intact. Muscle strength 5/5 in all extremities. Sensation intact. Gait not checked.  PSYCHIATRIC: The patient is alert  and oriented x 3.  SKIN: No obvious rash, lesion, or ulcer.   DATA REVIEW:   CBC  Recent Labs Lab 03/25/15 0401  WBC 6.8  HGB 11.9*  HCT 38.4*  PLT 131*    Chemistries   Recent Labs Lab 03/21/15 2200 03/24/15 0339 03/25/15 0401  NA 137 136 134*  K 3.9 4.7 4.8  CL 100* 99* 99*  CO2 28 28 28   GLUCOSE 342* 277* 269*  BUN 30* 63* 69*  CREATININE 1.09 2.07* 1.78*  CALCIUM 9.0 9.0 9.3  MG  --  1.9  --   AST 33  --   --   ALT 24  --   --   ALKPHOS 104  --   --   BILITOT 0.6  --   --     Cardiac Enzymes  Recent Labs Lab 03/22/15 0230  TROPONINI 0.03    Microbiology Results  Results for orders placed or performed during the hospital encounter of 02/14/15  Culture, blood (single)     Status: None   Collection Time: 02/14/15  1:05 PM  Result Value Ref Range Status   Micro Text Report   Final       COMMENT                   NO GROWTH AEROBICALLY/ANAEROBICALLY IN 5 DAYS   ANTIBIOTIC                                                      Culture, blood (single)     Status: None   Collection Time: 02/14/15  2:10 PM  Result Value Ref Range Status   Micro Text Report   Final       COMMENT                   NO GROWTH AEROBICALLY/ANAEROBICALLY IN 5 DAYS   ANTIBIOTIC                                                       Clostridium Difficile [facility])     Status: None   Collection Time: 02/14/15  8:38 PM  Result Value Ref Range Status   Micro Text Report   Final       C.DIFFICILE ANTIGEN       C.DIFFICILE GDH ANTIGEN : NEGATIVE   C.DIFFICILE TOXIN A/B     C.DIFFICILE TOXINS A AND B : NEGATIVE   INTERPRETATION            Negative for C. difficile.    ANTIBIOTIC                                                      Clostridium Difficile [facility])     Status: None   Collection Time: 02/17/15 10:45 AM  Result Value Ref Range Status   Micro Text Report   Final       C.DIFFICILE ANTIGEN       C.DIFFICILE GDH ANTIGEN : NEGATIVE   C.DIFFICILE  TOXIN A/B     C.DIFFICILE TOXINS A AND B : NEGATIVE   INTERPRETATION            Negative for C. difficile.    ANTIBIOTIC                                                        RADIOLOGY:  No results found.  EKG:   Orders placed or performed during the hospital encounter of 03/21/15  . ED EKG  . ED EKG  . EKG 12-Lead  . EKG 12-Lead      Management plans discussed with the patient, family and they are in agreement.  CODE STATUS:     Code Status Orders        Start     Ordered   03/22/15 0839  Full code   Continuous     03/22/15 0839      TOTAL TIME TAKING CARE OF THIS PATIENT: 50 minutes.    Elby Showers M.D on 03/25/2015 at 9:30 AM  Between 7am to 6pm - Pager - [phone]  After 6pm go to [email or web address] - password [email or web address] [facility]  [place] [facility]  Office  [phone]  CC: Primary care physician; Dortha Kern, MD

## 2015-03-25 NOTE — Discharge Instructions (Signed)
DISCHARGE INSTRUCTIONS:    DIET:  Cardiac diet and 2 g sodium with fluid restriction  DISCHARGE CONDITION:  Fair  ACTIVITY:  Activity as tolerated  OXYGEN:  Home Oxygen: No.  Oxygen Delivery: room air  DISCHARGE LOCATION:  nursing home   If you experience worsening of your admission symptoms, develop shortness of breath, life threatening emergency, suicidal or homicidal thoughts you must seek medical attention immediately by calling 911 or calling your MD immediately if symptoms less severe.  You Must read complete instructions/literature along with all the possible adverse reactions/side effects for all the Medicines you take and that have been prescribed to you. Take any new Medicines after you have completely understood and accpet all the possible adverse reactions/side effects.   Please note  You were cared for by a hospitalist during your hospital stay. If you have any questions about your discharge medications or the care you received while you were in the hospital after you are discharged, you can call the unit and asked to speak with the hospitalist on call if the hospitalist that took care of you is not available. Once you are discharged, your primary care physician will handle any further medical issues. Please note that NO REFILLS for any discharge medications will be authorized once you are discharged, as it is imperative that you return to your primary care physician (or establish a relationship with a primary care physician if you do not have one) for your aftercare needs so that they can reassess your need for medications and monitor your lab values.      Please see Wound Care instructions.

## 2015-03-25 NOTE — Progress Notes (Signed)
Inpatient Diabetes Program Recommendations  AACE/ADA: New Consensus Statement on Inpatient Glycemic Control (2013)  Target Ranges:  Prepandial:   less than 140 mg/dL      Peak postprandial:   less than 180 mg/dL (1-2 hours)      Critically ill patients:  140 - 180 mg/dL    Results for [name], [name] (MRN [id number]) as of 03/25/2015 08:47  Ref. Range 03/24/2015 07:28 03/24/2015 11:18 03/24/2015 16:15 03/24/2015 21:21 03/25/2015 07:19  Glucose-Capillary Latest Ref Range: 65-99 mg/dL 409264 (H) 811266 (H) 914176 (H) 310 (H) 244 (H)   Diabetes history: DM2 Outpatient Diabetes medications: Levemir 20 units QHS Current orders for Inpatient glycemic control: Levemir 23 units QHS, Novolog 0-15 units TID with meals  Inpatient Diabetes Program Recommendations Insulin - Basal: Fasting glucose is 244 mg/dl this morning. Please consider increasing Lantus to 26 untis QHS (0.3 units/kg). Correction (SSI): Glucose was 310 mg/dl at bedtime last night but no insulin given for correction since there are no orders for bedtime insulin correction. Please consider ordering Novolog bedtime correction scale. Insulin - Meal Coverage: Post prandial glucose is consistently elevated. According to the chart, patient is eating 100% of meals. Please consider ordering Novolog 4 units TID with meals for meal coverage (in addition to Novolog correction).  Susette RacerJulie Amilee Janvier, RN, BA, MHA, CDE Diabetes Coordinator Inpatient Diabetes Program  [phone] (Team Pager) [phone] [facility] Office) 03/25/2015 8:50 AM

## 2015-03-30 ENCOUNTER — Emergency Department
Admission: EM | Admit: 2015-03-30 | Discharge: 2015-03-30 | Disposition: A | Discharge status: Home / Self Care | Payer: Medicare Other | Attending: Emergency Medicine | Admitting: Emergency Medicine

## 2015-03-30 ENCOUNTER — Encounter: Payer: Self-pay | Admitting: Emergency Medicine

## 2015-03-30 ENCOUNTER — Other Ambulatory Visit: Payer: Self-pay

## 2015-03-30 DIAGNOSIS — K5900 Constipation, unspecified: Secondary | ICD-10-CM | POA: Insufficient documentation

## 2015-03-30 DIAGNOSIS — K922 Gastrointestinal hemorrhage, unspecified: Secondary | ICD-10-CM | POA: Diagnosis not present

## 2015-03-30 DIAGNOSIS — Z79899 Other long term (current) drug therapy: Secondary | ICD-10-CM | POA: Insufficient documentation

## 2015-03-30 DIAGNOSIS — I10 Essential (primary) hypertension: Secondary | ICD-10-CM | POA: Insufficient documentation

## 2015-03-30 DIAGNOSIS — K625 Hemorrhage of anus and rectum: Secondary | ICD-10-CM | POA: Diagnosis present

## 2015-03-30 DIAGNOSIS — Z792 Long term (current) use of antibiotics: Secondary | ICD-10-CM | POA: Diagnosis not present

## 2015-03-30 DIAGNOSIS — R197 Diarrhea, unspecified: Secondary | ICD-10-CM

## 2015-03-30 DIAGNOSIS — Z87891 Personal history of nicotine dependence: Secondary | ICD-10-CM | POA: Insufficient documentation

## 2015-03-30 DIAGNOSIS — Z7982 Long term (current) use of aspirin: Secondary | ICD-10-CM | POA: Insufficient documentation

## 2015-03-30 DIAGNOSIS — Z794 Long term (current) use of insulin: Secondary | ICD-10-CM | POA: Diagnosis not present

## 2015-03-30 DIAGNOSIS — E119 Type 2 diabetes mellitus without complications: Secondary | ICD-10-CM | POA: Insufficient documentation

## 2015-03-30 DIAGNOSIS — Z89511 Acquired absence of right leg below knee: Secondary | ICD-10-CM | POA: Diagnosis not present

## 2015-03-30 HISTORY — DX: Peripheral vascular disease, unspecified: I73.9

## 2015-03-30 HISTORY — DX: Major depressive disorder, single episode, unspecified: F32.9

## 2015-03-30 LAB — CBC
HCT: 38.8 % — ABNORMAL LOW (ref 40.0–52.0)
Hemoglobin: 12.2 g/dL — ABNORMAL LOW (ref 13.0–18.0)
MCH: 23.6 pg — ABNORMAL LOW (ref 26.0–34.0)
MCHC: 31.3 g/dL — ABNORMAL LOW (ref 32.0–36.0)
MCV: 75.3 fL — AB (ref 80.0–100.0)
PLATELETS: 162 10*3/uL (ref 150–440)
RBC: 5.16 MIL/uL (ref 4.40–5.90)
RDW: 20 % — ABNORMAL HIGH (ref 11.5–14.5)
WBC: 10.4 10*3/uL (ref 3.8–10.6)

## 2015-03-30 LAB — COMPREHENSIVE METABOLIC PANEL
ALT: 26 U/L (ref 17–63)
ANION GAP: 7 (ref 5–15)
AST: 27 U/L (ref 15–41)
Albumin: 3.2 g/dL — ABNORMAL LOW (ref 3.5–5.0)
Alkaline Phosphatase: 108 U/L (ref 38–126)
BUN: 41 mg/dL — AB (ref 6–20)
CALCIUM: 9 mg/dL (ref 8.9–10.3)
CO2: 26 mmol/L (ref 22–32)
Chloride: 101 mmol/L (ref 101–111)
Creatinine, Ser: 1.43 mg/dL — ABNORMAL HIGH (ref 0.61–1.24)
GFR, EST NON AFRICAN AMERICAN: 52 mL/min — AB (ref >60)
GLUCOSE: 255 mg/dL — AB (ref 65–99)
Potassium: 4.8 mmol/L (ref 3.5–5.1)
Sodium: 134 mmol/L — ABNORMAL LOW (ref 135–145)
TOTAL PROTEIN: 7.5 g/dL (ref 6.5–8.1)
Total Bilirubin: 0.5 mg/dL (ref 0.3–1.2)

## 2015-03-30 MED ORDER — SODIUM CHLORIDE 0.9 % IV BOLUS (SEPSIS)
500.0000 mL | Freq: Once | INTRAVENOUS | Status: AC
  Administered 2015-03-30: 500 mL via INTRAVENOUS

## 2015-03-30 NOTE — ED Notes (Signed)
Pt here via [facility] from [facility] with c/o rectal bleeding. Pt seen here off and on for the past couple of weeks for the same. Pt reports bright red blood last night, darker blood today. Pt alert and oriented upon arrival, foley catheter in place upon arrival.

## 2015-03-30 NOTE — ED Notes (Signed)
Secretary called for transport back to [facility].

## 2015-03-30 NOTE — ED Provider Notes (Signed)
[facility] Emergency Department Provider Note  ____________________________________________  Time seen: 1330  I have reviewed the triage vital signs and the nursing notes.   HISTORY  Chief Complaint Rectal Bleeding     HPI [name] is a 59 y.o. male who resides at [facility] for ongoing healthcare. He has a right BKA, diabetes, hypertension, coronary artery disease. Today he comes in the emergency Department due to concerns for rectal bleeding. He has had similar episodes recently. Apparently he had some brighter red blood bleeding in the past few days and then it turned dark today. He is on L Oquist's (anticoagulant). He complains of some general abdominal pain. He reports he has been having diarrhea, which is not new to him. Review of records shows that he was negative for C. difficile and April. He does not have any chest pain, shortness of breath, nausea, or vomiting.     Past Medical History  Diagnosis Date  . HTN (hypertension)   . 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 Anxiety   . Headache(784.0)   . NSTEMI (non-ST elevated myocardial infarction) 10/17/2011  . Diabetes mellitus   . Pulmonary emphysema   . Obesity   . Paroxysmal atrial flutter 10/15/2011  . CAD (coronary artery disease), autologous vein bypass graft     s/p CABG in 80's or 90's, multiple caths/PCI. Presented in June of 2014 with non-ST elevation myocardial infarction. Cardiac catheter[facility] done by [facility] Fransico [facility]Brennan [facility] showed severe three-vessel coronary artery disease (nondominant RCA )with patent LIMA to LAD, occluded SVG to OM1(new) and occluded SVG to PDA. There was significant ostial and distal left main stenosis. Attempted left main/LCX PCI  . MI (myocardial infarction)   . Chronic systolic heart failure     Ejection fraction 35%  . Broken femur   . Chronic indwelling Foley catheter   . CHF  (congestive heart failure)   . Major depression unk  . PVD (peripheral vascular disease) unk    Patient Active Problem List   Diagnosis Date Noted  . GI bleed 03/22/2015  . Chronic indwelling Foley catheter   . Atrial fibrillation 07/24/2014  . Hyperlipidemia 01/24/2014  . Chronic systolic heart failure   . Paroxysmal atrial flutter 05/20/2013  . Arteriosclerosis of coronary artery 04/21/2013  . Diabetes 04/21/2013  . S/P BKA (below knee amputation) unilateral 11/15/2011  . Weakness generalized 11/13/2011  . NSTEMI (non-ST elevated myocardial infarction) 10/17/2011  . Soft tissue infection of foot 10/15/2011  . Atrial flutter with rapid ventricular response 10/15/2011  . CAD (coronary artery disease), autologous vein bypass graft   . HTN (hypertension)   . Diabetes mellitus   . Diabetic foot ulcers 10/14/2011    Past Surgical History  Procedure Laterality Date  . Coronary artery bypass graft    . No past surgeries    . Amputation  10/17/2011    Procedure: AMPUTATION RAY;  Surgeon: Cammy CopaGregory Scott Dean;  [facility];  Service: Orthopedics;  Laterality: Right;  First and Second Ray Amputation  . Amputation  11/01/2011    Procedure: AMPUTATION BELOW KNEE;  Surgeon: Nadara MustardMarcus V Duda, MD;  Location: [facility] OR;  Service: Orthopedics;  Laterality: Right;  Right Below Knee Amputation  . Rt bka    . Cardiac catheterization  09/2006    [facility];EF 60%  . Cardiac catheterization  05/30/13    [facility]: Occluded SVG to RCA and SVG  to OM (both chronic) , patent LIMA to LAD which gives collaterals to LCx and RCA. Subtotal occlusion of the native left circumflex at [facility]. Ejection fraction 55%    Current Outpatient Rx  Name  Route  Sig  Dispense  Refill  . apixaban (ELIQUIS) 5 MG TABS tablet   Oral   Take 1 tablet (5 mg total) by mouth 2 (two) times daily. Patient not taking: Reported on 03/22/2015   60 tablet   6   . aspirin EC 81 MG tablet   Oral   Take 1 tablet (81 mg  total) by mouth daily. Patient not taking: Reported on 03/22/2015   90 tablet   3   . cetirizine (ZYRTEC) 10 MG tablet   Oral   Take 10 mg by mouth daily.         . citalopram (CELEXA) 10 MG tablet   Oral   Take 10 mg by mouth daily.         Marland Kitchen. gabapentin (NEURONTIN) 300 MG capsule   Oral   Take 300 mg by mouth 3 (three) times daily.         . insulin aspart (NOVOLOG) 100 UNIT/ML injection   Subcutaneous   Inject 0-15 Units into the skin 3 (three) times daily with meals.   10 mL   11   . insulin detemir (LEVEMIR) 100 UNIT/ML injection   Subcutaneous   Inject 0.23 mLs (23 Units total) into the skin daily.   10 mL   11   . levofloxacin (LEVAQUIN) 250 MG tablet   Oral   Take 1 tablet (250 mg total) by mouth daily.   7 tablet   0   . nitroGLYCERIN (NITROSTAT) 0.4 MG SL tablet   Sublingual   Place 1 tablet (0.4 mg total) under the tongue every 5 (five) minutes as needed for chest pain.   25 tablet   3   . ranolazine (RANEXA) 500 MG 12 hr tablet   Oral   Take 1 tablet (500 mg total) by mouth 2 (two) times daily.   30 tablet   0   . sertraline (ZOLOFT) 25 MG tablet   Oral   Take 25 mg by mouth daily.         . simvastatin (ZOCOR) 10 MG tablet   Oral   Take 10 mg by mouth at bedtime.         . traMADol (ULTRAM) 50 MG tablet   Oral   Take 50 mg by mouth every 6 (six) hours as needed.            Allergies Review of patient's allergies indicates no known allergies.  Family History  Problem Relation Age of Onset  . Alzheimer's disease Father   . Melanoma Father   . Benign prostatic hyperplasia Father   . Coronary artery disease Father     s/p CABG  . Heart attack Brother 57    MI  . Coronary artery disease Brother     s/p CABG  . Lymphoma Sister     non Hodgkin's   . Aortic aneurysm Mother   . Anemia Mother     Social History History  Substance Use Topics  . Smoking status: Former Smoker -- 0.50 packs/day for 35 years    Types:  Cigarettes    Quit date: 07/02/2001  . Smokeless tobacco: Never Used  . Alcohol Use: No    Review of Systems  Constitutional: Negative  for fever. ENT: Negative for sore throat. Cardiovascular: Negative for chest pain. Respiratory: Negative for shortness of breath. Gastrointestinal: Positive for diarrhea and for blood per rectum. See history of present illness. Genitourinary: Negative for dysuria. Musculoskeletal: Negative for back pain. Skin: Negative for rash. Neurological: Negative for headaches   10-point ROS otherwise negative.  ____________________________________________   PHYSICAL EXAM:  VITAL SIGNS: ED Triage Vitals  Enc Vitals Group     BP 03/30/15 1312 121/101 mmHg     Pulse Rate 03/30/15 1309 87     Resp 03/30/15 1309 20     Temp 03/30/15 1309 98.2 F (36.8 C)     Temp Source 03/30/15 1309 Oral     SpO2 03/30/15 1309 98 %     Weight 03/30/15 1309 194 lb (87.998 kg)     Height 03/30/15 1309  (1.702 m)     Head Cir --      Peak Flow --      Pain Score --      Pain Loc --      Pain Edu? --      Excl. in GC? --     Constitutional: Alert and communicative. no distress. ENT   Head: Normocephalic and atraumatic.   Nose: No congestion/rhinnorhea.   Mouth/Throat: Mucous membranes are moist. Cardiovascular: Normal rate, regular rhythm. Respiratory: Normal respiratory effort without tachypnea. Breath sounds are clear and equal bilaterally. No wheezes/rales/rhonchi. Gastrointestinal: Abdomen is soft but slightly tender diffusely. Back: There is no CVA tenderness. Musculoskeletal: Noted right-sided BKA.Marland Kitchen Neurologic:  Normal speech and language. No gross focal neurologic deficits are appreciated.  Skin:  Skin is warm, dry. No rash noted. Psychiatric: Mood and affect are normal. Speech and behavior are normal.  ____________________________________________    LABS (pertinent positives/negatives)  Hemoglobin his slightly higher the last  measurement. Today it is 12.2. White blood cell count of 10.4. Electrolyte panel shows high sugar at 255 with a BUN of 41 creatinine of 1.43. BUN is lower than it was previously when it was usually in the 60s. .  ____________________________________________   EKG  EKG at 1319 shows atrial fibrillation at a rate of 72 with otherwise normal intervals and axis. He has flat T waves in V5 V6.  ____________________________________________    RADIOLOGY   ____________________________________________   PROCEDURES  Procedure(s) performed: None  Critical Care performed: No  ____________________________________________   INITIAL IMPRESSION / ASSESSMENT AND PLAN / ED COURSE  Patient with ongoing chronic bleeding from his rectum. No change in the hemoglobin level. No acute distress. We have called [facility] for clarification on their acute concerns. They were worried about his blood count. With no changes for the worse on his blood count, with blood test actually looking better than they did last time, we will discharge him back to [facility] for ongoing care and follow-up with his regular physicians.  ____________________________________________   FINAL CLINICAL IMPRESSION(S) / ED DIAGNOSES  Final diagnoses:  Diarrhea  Chronic GI bleeding      Darien Ramus, MD 03/30/15 1606

## 2015-03-30 NOTE — Discharge Instructions (Signed)
The blood count today was little bit better than it was when it was last checked approximately 2 weeks ago. The renal function is a little bit better with a BUN in the 40s as opposed to the 60s. Continue current care. Follow-up with his regular doctor. Return to the emergency department if there are any urgent concerns.  Bloody Stools Bloody stools often mean that there is a problem in the digestive tract. Your caregiver may use the term "melena" to describe black, tarry, and bad smelling stools or "hematochezia" to describe red or maroon-colored stools. Blood seen in the stool can be caused by bleeding anywhere along the intestinal tract.  A black stool usually means that blood is coming from the upper part of the gastrointestinal tract (esophagus, stomach, or small bowel). Passing maroon-colored stools or bright red blood usually means that blood is coming from lower down in the large bowel or the rectum. However, sometimes massive bleeding in the stomach or small intestine can cause bright red bloody stools.  Consuming black licorice, lead, iron pills, medicines containing bismuth subsalicylate, or blueberries can also cause black stools. Your caregiver can test black stools to see if blood is present. It is important that the cause of the bleeding be found. Treatment can then be started, and the problem can be corrected. Rectal bleeding may not be serious, but you should not assume everything is okay until you know the cause.It is very important to follow up with your caregiver or a specialist in gastrointestinal problems. CAUSES  Blood in the stools can come from various underlying causes.Often, the cause is not found during your first visit. Testing is often needed to discover the cause of bleeding in the gastrointestinal tract. Causes range from simple to serious or even life-threatening.Possible causes include:  Hemorrhoids.These are veins that are full of blood (engorged) in the rectum. They  cause pain, inflammation, and may bleed.  Anal fissures.These are areas of painful tearing which may bleed. They are often caused by passing hard stool.  Diverticulosis.These are pouches that form on the colon over time, with age, and may bleed significantly.  Diverticulitis.This is inflammation in areas with diverticulosis. It can cause pain, fever, and bloody stools, although bleeding is rare.  Proctitis and colitis. These are inflamed areas of the rectum or colon. They may cause pain, fever, and bloody stools.  Polyps and cancer. Colon cancer is a leading cause of preventable cancer death.It often starts out as precancerous polyps that can be removed during a colonoscopy, preventing progression into cancer. Sometimes, polyps and cancer may cause rectal bleeding.  Gastritis and ulcers.Bleeding from the upper gastrointestinal tract (near the stomach) may travel through the intestines and produce black, sometimes tarry, often bad smelling stools. In certain cases, if the bleeding is fast enough, the stools may not be black, but red and the condition may be life-threatening. SYMPTOMS  You may have stools that are bright red and bloody, that are normal color with blood on them, or that are dark black and tarry. In some cases, you may only have blood in the toilet bowl. Any of these cases need medical care. You may also have:  Pain at the anus or anywhere in the rectum.  Lightheadedness or feeling faint.  Extreme weakness.  Nausea or vomiting.  Fever. DIAGNOSIS Your caregiver may use the following methods to find the cause of your bleeding:  Taking a medical history. Age is important. Older people tend to develop polyps and cancer more often. If  there is anal pain and a hard, large stool associated with bleeding, a tear of the anus may be the cause. If blood drips into the toilet after a bowel movement, bleeding hemorrhoids may be the problem. The color and frequency of the bleeding are  additional considerations. In most cases, the medical history provides clues, but seldom the final answer.  A visual and finger (digital) exam. Your caregiver will inspect the anal area, looking for tears and hemorrhoids. A finger exam can provide information when there is tenderness or a growth inside. In men, the prostate is also examined.  Endoscopy. Several types of small, long scopes (endoscopes) are used to view the colon.  In the office, your caregiver may use a rigid, or more commonly, a flexible viewing sigmoidoscope. This exam is called flexible sigmoidoscopy. It is performed in 5 to 10 minutes.  A more thorough exam is accomplished with a colonoscope. It allows your caregiver to view the entire 5 to 6 foot long colon. Medicine to help you relax (sedative) is usually given for this exam. Frequently, a bleeding lesion may be present beyond the reach of the sigmoidoscope. So, a colonoscopy may be the best exam to start with. Both exams are usually done on an outpatient basis. This means the patient does not stay overnight in the hospital or surgery center.  An upper endoscopy may be needed to examine your stomach. Sedation is used and a flexible endoscope is put in your mouth, down to your stomach.  A barium enema X-ray. This is an X-ray exam. It uses liquid barium inserted by enema into the rectum. This test alone may not identify an actual bleeding point. X-rays highlight abnormal shadows, such as those made by lumps (tumors), diverticuli, or colitis. TREATMENT  Treatment depends on the cause of your bleeding.   For bleeding from the stomach or colon, the caregiver doing your endoscopy or colonoscopy may be able to stop the bleeding as part of the procedure.  Inflammation or infection of the colon can be treated with medicines.  Many rectal problems can be treated with creams, suppositories, or warm baths.  Surgery is sometimes needed.  Blood transfusions are sometimes needed if you  have lost a lot of blood.  For any bleeding problem, let your caregiver know if you take aspirin or other blood thinners regularly. HOME CARE INSTRUCTIONS   Take any medicines exactly as prescribed.  Keep your stools soft by eating a diet high in fiber. Prunes (1 to 3 a day) work well for many people.  Drink enough water and fluids to keep your urine clear or pale yellow.  Take sitz baths if advised. A sitz bath is when you sit in a bathtub with warm water for 10 to 15 minutes to soak, soothe, and cleanse the rectal area.  If enemas or suppositories are advised, be sure you know how to use them. Tell your caregiver if you have problems with this.  Monitor your bowel movements to look for signs of improvement or worsening. SEEK MEDICAL CARE IF:   You do not improve in the time expected.  Your condition worsens after initial improvement.  You develop any new symptoms. SEEK IMMEDIATE MEDICAL CARE IF:   You develop severe or prolonged rectal bleeding.  You vomit blood.  You feel weak or faint.  You have a fever. MAKE SURE YOU:  Understand these instructions.  Will watch your condition.  Will get help right away if you are not doing well or  get worse. Document Released: 10/20/2002 Document Revised: 01/22/2012 Document Reviewed: 03/17/2011 [facility] Patient Information 2015 [employer] [place]. This information is not intended to replace advice given to you by your health care provider. Make sure you discuss any questions you have with your health care provider.

## 2015-03-30 NOTE — ED Notes (Signed)
Pt waiting on EMS transport 

## 2015-04-05 ENCOUNTER — Ambulatory Visit: Payer: Medicare PPO | Admitting: Cardiovascular Disease

## 2015-04-05 ENCOUNTER — Encounter: Payer: Self-pay | Admitting: *Deleted

## 2015-04-14 ENCOUNTER — Ambulatory Visit: Payer: Medicare Other | Admitting: Surgery

## 2015-04-21 ENCOUNTER — Ambulatory Visit: Payer: Medicare Other | Admitting: Surgery

## 2015-06-22 ENCOUNTER — Encounter: Payer: Self-pay | Admitting: *Deleted

## 2015-06-22 ENCOUNTER — Encounter
Admission: RE | Disposition: A | Discharge status: Home / Self Care | Payer: Self-pay | Source: Ambulatory Visit | Attending: Vascular Surgery

## 2015-06-22 ENCOUNTER — Ambulatory Visit
Admission: RE | Admit: 2015-06-22 | Discharge: 2015-06-22 | Disposition: A | Discharge status: Home / Self Care | Payer: Medicare Other | Source: Ambulatory Visit | Attending: Vascular Surgery | Admitting: Vascular Surgery

## 2015-06-22 DIAGNOSIS — I10 Essential (primary) hypertension: Secondary | ICD-10-CM | POA: Insufficient documentation

## 2015-06-22 DIAGNOSIS — Z951 Presence of aortocoronary bypass graft: Secondary | ICD-10-CM | POA: Insufficient documentation

## 2015-06-22 DIAGNOSIS — Z89512 Acquired absence of left leg below knee: Secondary | ICD-10-CM | POA: Insufficient documentation

## 2015-06-22 DIAGNOSIS — E119 Type 2 diabetes mellitus without complications: Secondary | ICD-10-CM | POA: Insufficient documentation

## 2015-06-22 DIAGNOSIS — I519 Heart disease, unspecified: Secondary | ICD-10-CM | POA: Insufficient documentation

## 2015-06-22 DIAGNOSIS — I70239 Atherosclerosis of native arteries of right leg with ulceration of unspecified site: Secondary | ICD-10-CM | POA: Insufficient documentation

## 2015-06-22 DIAGNOSIS — E785 Hyperlipidemia, unspecified: Secondary | ICD-10-CM | POA: Insufficient documentation

## 2015-06-22 DIAGNOSIS — Z79899 Other long term (current) drug therapy: Secondary | ICD-10-CM | POA: Diagnosis not present

## 2015-06-22 DIAGNOSIS — Z794 Long term (current) use of insulin: Secondary | ICD-10-CM | POA: Insufficient documentation

## 2015-06-22 DIAGNOSIS — L97919 Non-pressure chronic ulcer of unspecified part of right lower leg with unspecified severity: Secondary | ICD-10-CM | POA: Insufficient documentation

## 2015-06-22 DIAGNOSIS — I70249 Atherosclerosis of native arteries of left leg with ulceration of unspecified site: Secondary | ICD-10-CM | POA: Diagnosis present

## 2015-06-22 DIAGNOSIS — I509 Heart failure, unspecified: Secondary | ICD-10-CM | POA: Diagnosis not present

## 2015-06-22 HISTORY — DX: Angina pectoris, unspecified: I20.9

## 2015-06-22 HISTORY — PX: PERIPHERAL VASCULAR CATHETERIZATION: SHX172C

## 2015-06-22 HISTORY — DX: Encephalopathy, unspecified: G93.40

## 2015-06-22 HISTORY — DX: Unspecified atrial fibrillation: I48.91

## 2015-06-22 HISTORY — DX: Cellulitis, unspecified: L03.90

## 2015-06-22 HISTORY — DX: Rhabdomyolysis: M62.82

## 2015-06-22 HISTORY — DX: Unspecified dementia, unspecified severity, without behavioral disturbance, psychotic disturbance, mood disturbance, and anxiety: F03.90

## 2015-06-22 LAB — BASIC METABOLIC PANEL
Anion gap: 10 (ref 5–15)
BUN: 18 mg/dL (ref 6–20)
CHLORIDE: 98 mmol/L — AB (ref 101–111)
CO2: 28 mmol/L (ref 22–32)
Calcium: 9.4 mg/dL (ref 8.9–10.3)
Creatinine, Ser: 0.97 mg/dL (ref 0.61–1.24)
GFR calc Af Amer: >60 mL/min (ref >60)
GFR calc non Af Amer: >60 mL/min (ref >60)
Glucose, Bld: 159 mg/dL — ABNORMAL HIGH (ref 65–99)
POTASSIUM: 4.5 mmol/L (ref 3.5–5.1)
Sodium: 136 mmol/L (ref 135–145)

## 2015-06-22 SURGERY — LOWER EXTREMITY ANGIOGRAPHY
Anesthesia: Moderate Sedation | Laterality: Left

## 2015-06-22 MED ORDER — MIDAZOLAM HCL 5 MG/5ML IJ SOLN
INTRAMUSCULAR | Status: AC
  Filled 2015-06-22: qty 5

## 2015-06-22 MED ORDER — CEFAZOLIN SODIUM 1-5 GM-% IV SOLN: 1.0000 g | Freq: Once | INTRAVENOUS | Status: DC

## 2015-06-22 MED ORDER — OXYCODONE HCL 5 MG PO TABS: 5.0000 mg | ORAL_TABLET | ORAL | Status: DC | PRN

## 2015-06-22 MED ORDER — FENTANYL CITRATE (PF) 100 MCG/2ML IJ SOLN
INTRAMUSCULAR | Status: AC
  Filled 2015-06-22: qty 2

## 2015-06-22 MED ORDER — LIDOCAINE HCL (PF) 1 % IJ SOLN
INTRAMUSCULAR | Status: AC
Start: 2015-06-22 — End: 2015-06-22
  Filled 2015-06-22: qty 10

## 2015-06-22 MED ORDER — HYDRALAZINE HCL 20 MG/ML IJ SOLN: 5.0000 mg | INTRAMUSCULAR | Status: DC | PRN

## 2015-06-22 MED ORDER — ACETAMINOPHEN 325 MG PO TABS: 325.0000 mg | ORAL_TABLET | ORAL | Status: DC | PRN

## 2015-06-22 MED ORDER — SODIUM CHLORIDE 0.9 % IV SOLN
INTRAVENOUS | Status: DC
  Administered 2015-06-22: 10:00:00 via INTRAVENOUS

## 2015-06-22 MED ORDER — HEPARIN SODIUM (PORCINE) 1000 UNIT/ML IJ SOLN
INTRAMUSCULAR | Status: DC | PRN
  Administered 2015-06-22: 5000 [IU] via INTRAVENOUS

## 2015-06-22 MED ORDER — HYDROMORPHONE HCL 1 MG/ML IJ SOLN: 0.5000 mg | INTRAMUSCULAR | Status: DC | PRN

## 2015-06-22 MED ORDER — FENTANYL CITRATE (PF) 100 MCG/2ML IJ SOLN
INTRAMUSCULAR | Status: AC
Start: 2015-06-22 — End: 2015-06-22
  Filled 2015-06-22: qty 2

## 2015-06-22 MED ORDER — HEPARIN (PORCINE) IN NACL 2-0.9 UNIT/ML-% IJ SOLN
INTRAMUSCULAR | Status: AC
  Filled 2015-06-22: qty 1000

## 2015-06-22 MED ORDER — ACETAMINOPHEN 325 MG RE SUPP: 325.0000 mg | RECTAL | Status: DC | PRN

## 2015-06-22 MED ORDER — FENTANYL CITRATE (PF) 100 MCG/2ML IJ SOLN
INTRAMUSCULAR | Status: DC | PRN
  Administered 2015-06-22 (×3): 50 ug via INTRAVENOUS

## 2015-06-22 MED ORDER — HEPARIN SODIUM (PORCINE) 1000 UNIT/ML IJ SOLN
INTRAMUSCULAR | Status: AC
  Filled 2015-06-22: qty 1

## 2015-06-22 MED ORDER — MIDAZOLAM HCL 2 MG/2ML IJ SOLN
INTRAMUSCULAR | Status: DC | PRN
  Administered 2015-06-22 (×2): 2 mg via INTRAVENOUS
  Administered 2015-06-22: 1 mg via INTRAVENOUS

## 2015-06-22 MED ORDER — LABETALOL HCL 5 MG/ML IV SOLN: 10.0000 mg | INTRAVENOUS | Status: DC | PRN

## 2015-06-22 SURGICAL SUPPLY — 24 items
BALLN ARMADA 2.5X100X150 (BALLOONS) ×4
BALLN LUTONIX 4X150X130 (BALLOONS) ×8
BALLN LUTONIX 5X150X130 (BALLOONS) ×4
BALLN ULTRVRSE 3X150X150 (BALLOONS) ×4 IMPLANT
BALLOON ARMADA 2.5X100X150 (BALLOONS) ×2 IMPLANT
BALLOON LUTONIX 4X150X130 (BALLOONS) ×4 IMPLANT
BALLOON LUTONIX 5X150X130 (BALLOONS) ×2 IMPLANT
CATH PIG 70CM (CATHETERS) ×4 IMPLANT
CATH SIDEKICK XL ST 110CM (SHEATH) ×4 IMPLANT
DEVICE CLOSURE MYNXGRIP 6/7F (Vascular Products) ×4 IMPLANT
DEVICE PRESTO INFLATION (MISCELLANEOUS) ×4 IMPLANT
GLIDEWIRE ANGLED SS 035X260CM (WIRE) ×4 IMPLANT
IV NS 500ML (IV SOLUTION) ×2
IV NS 500ML BAXH (IV SOLUTION) ×2 IMPLANT
KIT FLOWMATE PROCEDURAL (MISCELLANEOUS) ×4 IMPLANT
PACK ANGIOGRAPHY (CUSTOM PROCEDURE TRAY) ×4 IMPLANT
SET INTRO CAPELLA COAXIAL (SET/KITS/TRAYS/PACK) ×4 IMPLANT
SHEATH BRITE TIP 5FRX11 (SHEATH) ×4 IMPLANT
SHEATH BRITE TIP 6FRX11 (SHEATH) ×4 IMPLANT
SHEATH FLEXOR ANSEL2 7FRX45 (SHEATH) ×4 IMPLANT
SYR MEDRAD MARK V 150ML (SYRINGE) ×4 IMPLANT
TUBING CONTRAST HIGH PRESS 72 (TUBING) ×4 IMPLANT
WIRE G V18X300CM (WIRE) ×4 IMPLANT
WIRE J 3MM .035X145CM (WIRE) ×4 IMPLANT

## 2015-06-22 NOTE — H&P (Signed)
[facility] History & Physical Update  The patient was interviewed and re-examined.  The patient's previous History and Physical has been reviewed and is unchanged.  There is no change in the plan of care.  Schnier, Latina Craver, MD  06/22/2015, 12:55 PM

## 2015-06-22 NOTE — Op Note (Signed)
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  Abdominal aortogram  2.  Left lower extremity distal runoff third order catheter placement  3.  Crosser atherectomy left superficial femoral and popliteal artery  4.  Percutaneous transluminal angioplasty to 5 mm left superficial femoral and popliteal artery using the Lutonix balloon  5.  Percutaneous transluminal and plasty to 2.5 mm left peroneal    Anesthesia: Conscious sedation  Sheath: 7 French sheath  Contrast: 115 cc  Fluoroscopy Time: 13.9 minutes  Indications:  Patient presents with ulcerations of his left lower extremity. He is already lost his right lower extremity to atherosclerotic changes and gangrene. Physical examination as well as his history support profound atherosclerotic occlusive disease in the face of his tissue loss he is undergoing any angiography with the hope for intervention for limb salvage. The risks and benefits of been reviewed all questions of been answered patient agrees proceed with angiography and intervention.  Procedure:  [name] Hamis a 59 y.o. male who was identified and appropriate procedural time out was performed.  The patient was then placed supine on the table and prepped and draped in the usual sterile fashion.  Ultrasound was used to evaluate the right common femoral artery.  It was patent .  A digital ultrasound image was acquired.  A micropuncture needle was used to access the right common femoral artery under direct ultrasound guidance and a permanent image was performed.  A 0.035 J wire was advanced without resistance and a 5Fr sheath was placed.    J-wire and pigtail catheter  were then advanced to the level of T12 and AP projection of the aorta was obtained. Pigtail catheter was then repositioned to above the aortic bifurcation and bilateral oblique views of the pelvis are obtained. Pigtail catheter stiff angle Glidewire used to cross the aortic bifurcation and the pigtail catheter is advanced down to the distal external iliac artery on the left where and LAO projection of the common femoral artery is obtained. Wire and catheter are then negotiated into the SFA on the left and distal runoff is obtained. After review these images 5000 units of heparin is given wires reintroduced into the SFA pigtail catheter was removed and a 7 [place] Ansell sheath is advanced up and over the bifurcation and positioned with the tip in the common femoral on the left.  A sidekick catheter is then advanced over the Glidewire and positioned in the mid SFA the Crosser atherectomy catheter catheter 14S is then prepped on the field.  Catheter is advanced through the sidekick down to the occlusion and engaged. Crosser catheter negotiates this occlusion fairly easily hand injection contrast through the 014 wire side-port demonstrates intraluminal positioning in the mid popliteal and therefore a [place] core wire is introduced.  A 3 x 15 mm balloon is advanced over the wire and beginning at the distal end of the occlusion serial and plasty was performed each inflation is to 10 atm for 1 minute. Follow-up imaging demonstrates there is now recanalization although the lumen demonstrates significant disease along its course.  The straight glide catheter is then advanced over the wire positioned in the distal popliteal and 035 wire is exchanged for the [place]a core. Beginning at the distal end of the occlusion 4 x 15  Lutonix balloons are used to serially angioplasty the SFA and proximal popliteal. Inflations are to 14 atm for 3 full minutes. Follow-up imaging demonstrates there is a greater than 50% residual  narrowing in 2 locations one at Hunter's canal and one more proximally. Therefore a 5 x 15 Lutonix balloon is advanced across these lesions inflated to 10 atm for 3 full minutes. Follow-up imaging demonstrates that there is now less than 15% residual stenosis along the entire length of the SFA appears well treated.  Attention is then turned to the trifurcation previously noted peroneal demonstrates significant disease. Nitroglycerin is then instilled 2 aliquots of 200 g are infused intra-arterially. This does not yield any change and subsequent leg a VAT wire is negotiated into the peroneal which is the dominant runoff and a 2.5 x 10 mm balloon is advanced across the proximal peroneal inflation is to 8 atm for 1 minute. Follow-up imaging now demonstrates that the peroneal is patent all the way down to the ankle and fills the dorsalis pedis quite nicely of interest the posterior tibial is now been recruited back into the circulation appears to be patent down to the foot as well.  Sheath is pulled back into the right external iliac oblique views obtained and subsequently a minx device is deployed without difficulty. There are no immediate Occasions.  Interpretation: The abdominal aorta is opacified with a bolus injection contrast. 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 There are no hemodynamically significant lesions noted. Renal arteries not well visualized.  The bilateral common and external iliac arteries are widely patent.  The left common femoral and profunda femoris are patent without evidence of hemodynamically significant stenosis. The SFA is noted to have diffuse disease in its proximal one third and subsequently occludes in its midportion it remains occluded down through Hunter's canal the above-the-knee popliteal is reconstituted and there appears to be diseased trifurcation with single-vessel vessel runoff to the foot via the peroneal posterior tibial does fill more distally  anterior tibial is occluded near its origin remains occluded throughout the entire course.  Following Crosser atherectomy there is confirmation of intraluminal positioning.  Following angioplasty ultimately to 5 mm with a Lutonix balloon there is patency of the SFA with minimal residual stenosis. Following and plasty of the peroneal 22.5 mm in association with intra-arterial nitroglycerin there is wide patency of the peroneal down to the foot filling the dorsalis pedis quite nicely there is also recruitment of the posterior tibial now into the distal circulation.   Disposition: Patient was taken to the recovery room in stable condition having tolerated the procedure well. There are no immediate complication  Kilo Eshelman, Dolores Lory 06/22/2015,12:55 PM

## 2015-06-23 ENCOUNTER — Encounter: Payer: Self-pay | Admitting: Vascular Surgery

## 2015-08-09 NOTE — OR Nursing (Signed)
Late entry for 06/22/15: flouro time 13.9 mins, 1075 mGy, [id number], and 115 ml contrast used during CV lab procedure.

## 2015-12-02 ENCOUNTER — Other Ambulatory Visit: Payer: Self-pay | Admitting: Vascular Surgery

## 2015-12-07 ENCOUNTER — Encounter
Admission: RE | Disposition: A | Discharge status: Home / Self Care | Payer: Self-pay | Source: Ambulatory Visit | Attending: Vascular Surgery

## 2015-12-07 ENCOUNTER — Ambulatory Visit
Admission: RE | Admit: 2015-12-07 | Discharge: 2015-12-07 | Disposition: A | Discharge status: Home / Self Care | Payer: Medicare Other | Source: Ambulatory Visit | Attending: Vascular Surgery | Admitting: Vascular Surgery

## 2015-12-07 DIAGNOSIS — I110 Hypertensive heart disease with heart failure: Secondary | ICD-10-CM | POA: Insufficient documentation

## 2015-12-07 DIAGNOSIS — Z794 Long term (current) use of insulin: Secondary | ICD-10-CM | POA: Diagnosis not present

## 2015-12-07 DIAGNOSIS — I70222 Atherosclerosis of native arteries of extremities with rest pain, left leg: Secondary | ICD-10-CM | POA: Insufficient documentation

## 2015-12-07 DIAGNOSIS — E785 Hyperlipidemia, unspecified: Secondary | ICD-10-CM | POA: Insufficient documentation

## 2015-12-07 DIAGNOSIS — J439 Emphysema, unspecified: Secondary | ICD-10-CM | POA: Insufficient documentation

## 2015-12-07 DIAGNOSIS — Z89512 Acquired absence of left leg below knee: Secondary | ICD-10-CM | POA: Diagnosis not present

## 2015-12-07 DIAGNOSIS — I2510 Atherosclerotic heart disease of native coronary artery without angina pectoris: Secondary | ICD-10-CM | POA: Insufficient documentation

## 2015-12-07 DIAGNOSIS — Z79899 Other long term (current) drug therapy: Secondary | ICD-10-CM | POA: Insufficient documentation

## 2015-12-07 DIAGNOSIS — E119 Type 2 diabetes mellitus without complications: Secondary | ICD-10-CM | POA: Insufficient documentation

## 2015-12-07 DIAGNOSIS — I509 Heart failure, unspecified: Secondary | ICD-10-CM | POA: Diagnosis not present

## 2015-12-07 DIAGNOSIS — Z951 Presence of aortocoronary bypass graft: Secondary | ICD-10-CM | POA: Insufficient documentation

## 2015-12-07 HISTORY — PX: PERIPHERAL VASCULAR CATHETERIZATION: SHX172C

## 2015-12-07 LAB — CREATININE, SERUM
Creatinine, Ser: 1.32 mg/dL — ABNORMAL HIGH (ref 0.61–1.24)
GFR calc Af Amer: >60 mL/min (ref >60)
GFR calc non Af Amer: 57 mL/min — ABNORMAL LOW (ref >60)

## 2015-12-07 LAB — BUN: BUN: 34 mg/dL — ABNORMAL HIGH (ref 6–20)

## 2015-12-07 LAB — GLUCOSE, CAPILLARY
GLUCOSE-CAPILLARY: 133 mg/dL — AB (ref 65–99)
GLUCOSE-CAPILLARY: 144 mg/dL — AB (ref 65–99)
Glucose-Capillary: 137 mg/dL — ABNORMAL HIGH (ref 65–99)

## 2015-12-07 SURGERY — ABDOMINAL AORTOGRAM W/LOWER EXTREMITY
Wound class: Clean

## 2015-12-07 MED ORDER — FENTANYL CITRATE (PF) 100 MCG/2ML IJ SOLN
INTRAMUSCULAR | Status: DC | PRN
  Administered 2015-12-07 (×2): 50 ug via INTRAVENOUS

## 2015-12-07 MED ORDER — HEPARIN (PORCINE) IN NACL 2-0.9 UNIT/ML-% IJ SOLN
INTRAMUSCULAR | Status: AC
  Filled 2015-12-07: qty 1000

## 2015-12-07 MED ORDER — DEXTROSE 5 % IV SOLN
1.5000 g | INTRAVENOUS | Status: AC
  Administered 2015-12-07: 1.5 g via INTRAVENOUS

## 2015-12-07 MED ORDER — ACETAMINOPHEN 325 MG RE SUPP: 325.0000 mg | RECTAL | Status: DC | PRN

## 2015-12-07 MED ORDER — ONDANSETRON HCL 4 MG/2ML IJ SOLN: 4.0000 mg | Freq: Four times a day (QID) | INTRAMUSCULAR | Status: DC | PRN

## 2015-12-07 MED ORDER — CLOPIDOGREL BISULFATE 75 MG PO TABS
300.0000 mg | ORAL_TABLET | Freq: Every day | ORAL | Status: DC
  Administered 2015-12-07: 300 mg via ORAL

## 2015-12-07 MED ORDER — MIDAZOLAM HCL 2 MG/2ML IJ SOLN
INTRAMUSCULAR | Status: DC | PRN
  Administered 2015-12-07: 2 mg via INTRAVENOUS
  Administered 2015-12-07: 1 mg via INTRAVENOUS

## 2015-12-07 MED ORDER — SODIUM CHLORIDE 0.9 % IV SOLN
INTRAVENOUS | Status: DC
  Administered 2015-12-07: 09:00:00 via INTRAVENOUS

## 2015-12-07 MED ORDER — HYDROMORPHONE HCL 1 MG/ML IJ SOLN: 1.0000 mg | Freq: Once | INTRAMUSCULAR | Status: DC

## 2015-12-07 MED ORDER — LIDOCAINE HCL 1 % IJ SOLN
INTRAMUSCULAR | Status: DC | PRN
  Administered 2015-12-07: 5 mL

## 2015-12-07 MED ORDER — LIDOCAINE HCL (PF) 1 % IJ SOLN
INTRAMUSCULAR | Status: AC
  Filled 2015-12-07: qty 30

## 2015-12-07 MED ORDER — OXYCODONE HCL 5 MG PO TABS: 5.0000 mg | ORAL_TABLET | ORAL | Status: DC | PRN

## 2015-12-07 MED ORDER — METHYLPREDNISOLONE SODIUM SUCC 125 MG IJ SOLR: 125.0000 mg | INTRAMUSCULAR | Status: DC | PRN

## 2015-12-07 MED ORDER — CLOPIDOGREL BISULFATE 75 MG PO TABS
ORAL_TABLET | ORAL | Status: AC
  Administered 2015-12-07: 300 mg via ORAL
  Filled 2015-12-07: qty 4

## 2015-12-07 MED ORDER — FAMOTIDINE 20 MG PO TABS: 40.0000 mg | ORAL_TABLET | ORAL | Status: DC | PRN

## 2015-12-07 MED ORDER — HEPARIN SODIUM (PORCINE) 1000 UNIT/ML IJ SOLN
INTRAMUSCULAR | Status: AC
  Filled 2015-12-07: qty 1

## 2015-12-07 MED ORDER — MIDAZOLAM HCL 5 MG/5ML IJ SOLN
INTRAMUSCULAR | Status: AC
  Filled 2015-12-07: qty 5

## 2015-12-07 MED ORDER — DEXTROSE 5 % IV SOLN
INTRAVENOUS | Status: AC
  Filled 2015-12-07: qty 1.5

## 2015-12-07 MED ORDER — ACETAMINOPHEN 325 MG PO TABS: 325.0000 mg | ORAL_TABLET | ORAL | Status: DC | PRN

## 2015-12-07 MED ORDER — MORPHINE SULFATE (PF) 4 MG/ML IV SOLN: 2.0000 mg | INTRAVENOUS | Status: DC | PRN

## 2015-12-07 MED ORDER — FENTANYL CITRATE (PF) 100 MCG/2ML IJ SOLN
INTRAMUSCULAR | Status: AC
  Filled 2015-12-07: qty 2

## 2015-12-07 MED ORDER — HEPARIN SODIUM (PORCINE) 1000 UNIT/ML IJ SOLN
INTRAMUSCULAR | Status: DC | PRN
  Administered 2015-12-07: 4000 [IU] via INTRAVENOUS

## 2015-12-07 MED ORDER — CLOPIDOGREL BISULFATE 75 MG PO TABS: 75.0000 mg | ORAL_TABLET | Freq: Every day | ORAL | Status: AC

## 2015-12-07 MED ORDER — IOHEXOL 300 MG/ML  SOLN
INTRAMUSCULAR | Status: DC | PRN
  Administered 2015-12-07: 80 mL via INTRA_ARTERIAL

## 2015-12-07 SURGICAL SUPPLY — 21 items
BALLN LUTONIX DCB 5X80X130 (BALLOONS) ×5
BALLN LUTONIX DCB 6X40X130 (BALLOONS) ×5
BALLN ULTRVRSE 2.5X40X150 (BALLOONS) ×5
BALLOON LUTONIX DCB 5X80X130 (BALLOONS) ×3 IMPLANT
BALLOON LUTONIX DCB 6X40X130 (BALLOONS) ×3 IMPLANT
BALLOON ULTRVRSE 150X40X2.5 (BALLOONS) ×3 IMPLANT
CATH PIG 70CM (CATHETERS) ×5 IMPLANT
CATH STS 5FR 125CM (CATHETERS) ×5 IMPLANT
DEVICE STARCLOSE SE CLOSURE (Vascular Products) ×5 IMPLANT
DEVICE TORQUE (MISCELLANEOUS) ×5 IMPLANT
GLIDEWIRE ANGLED SS 035X260CM (WIRE) ×5 IMPLANT
GUIDEWIRE GOLD .018X300 (WIRE) ×5 IMPLANT
PACK ANGIOGRAPHY (CUSTOM PROCEDURE TRAY) ×5 IMPLANT
SET INTRO CAPELLA COAXIAL (SET/KITS/TRAYS/PACK) ×5 IMPLANT
SHEATH BRITE TIP 5FRX11 (SHEATH) ×5 IMPLANT
SHEATH RAABE 6FR (SHEATH) ×5 IMPLANT
SYR MEDRAD MARK V 150ML (SYRINGE) ×5 IMPLANT
TUBING CONTRAST HIGH PRESS 72 (TUBING) ×5 IMPLANT
WIRE G V18X300CM (WIRE) ×5 IMPLANT
WIRE J 3MM .035X145CM (WIRE) ×5 IMPLANT
WIRE MAGIC TORQUE 260C (WIRE) ×5 IMPLANT

## 2015-12-07 NOTE — Discharge Instructions (Signed)
Angiogram, Care After °Refer to this sheet in the next few weeks. These instructions provide you with information about caring for yourself after your procedure. Your health care provider may also give you more specific instructions. Your treatment has been planned according to current medical practices, but problems sometimes occur. Call your health care provider if you have any problems or questions after your procedure. °WHAT TO EXPECT AFTER THE PROCEDURE °After your procedure, it is typical to have the following: °· Bruising at the catheter insertion site that usually fades within 1-2 weeks. °· Blood collecting in the tissue (hematoma) that may be painful to the touch. It should usually decrease in size and tenderness within 1-2 weeks. °HOME CARE INSTRUCTIONS °· Take medicines only as directed by your health care provider. °· You may shower 24-48 hours after the procedure or as directed by your health care provider. Remove the bandage (dressing) and gently wash the site with plain soap and water. Pat the area dry with a clean towel. Do not rub the site, because this may cause bleeding. °· Do not take baths, swim, or use a hot tub until your health care provider approves. °· Check your insertion site every day for redness, swelling, or drainage. °· Do not apply powder or lotion to the site. °· Do not lift over 10 lb (4.5 kg) for 5 days after your procedure or as directed by your health care provider. °· Ask your health care provider when it is okay to: °¨ Return to work or school. °¨ Resume usual physical activities or sports. °¨ Resume sexual activity. °· Do not drive home if you are discharged the same day as the procedure. Have someone else drive you. °· You may drive 24 hours after the procedure unless otherwise instructed by your health care provider. °· Do not operate machinery or power tools for 24 hours after the procedure or as directed by your health care provider. °· If your procedure was done as an  outpatient procedure, which means that you went home the same day as your procedure, a responsible adult should be with you for the first 24 hours after you arrive home. °· Keep all follow-up visits as directed by your health care provider. This is important. °SEEK MEDICAL CARE IF: °· You have a fever. °· You have chills. °· You have increased bleeding from the catheter insertion site. Hold pressure on the site. °SEEK IMMEDIATE MEDICAL CARE IF: °· You have unusual pain at the catheter insertion site. °· You have redness, warmth, or swelling at the catheter insertion site. °· You have drainage (other than a small amount of blood on the dressing) from the catheter insertion site. °· The catheter insertion site is bleeding, and the bleeding does not stop after 30 minutes of holding steady pressure on the site. °· The area near or just beyond the catheter insertion site becomes pale, cool, tingly, or numb. °  °This information is not intended to replace advice given to you by your health care provider. Make sure you discuss any questions you have with your health care provider. °  °Document Released: 05/18/2005 Document Revised: 11/20/2014 Document Reviewed: 04/02/2013 °Elsevier Interactive Patient Education ©2016 [employer] ° °

## 2015-12-07 NOTE — OR Nursing (Signed)
Respirations, sleep apnea noted, placed on 2 liters [place] due to oxygen saturations dropping intermittently to 80's.

## 2015-12-07 NOTE — Op Note (Signed)
[facility] Percutaneous Study/Intervention Procedural Note   Date of Surgery: 12/07/2015  Surgeon:  Katha Cabal, MD.  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 Introduction catheter into left lower extremity 3rd order catheter placement  2. Contrast injection left lower extremity for distal runoff   3. Percutaneous transluminal angioplasty left superficial femoral and popliteal arteries to 6 mm with Lutonix 4. Percutaneous transluminal angioplasty left posterior tibial artery  to 2.5 mm             5.  Star close closure right common femoral arteriotomy  Anesthesia: Conscious sedation was administered under my direct supervision. IV Versed plus fentanyl were utilized. Continuous ECG, pulse oximetry and blood pressure was monitored throughout the entire procedure. A total of 3 milligrams of Versed and 100 micrograms of fentanyl were utilized.  Conscious sedation was begun at  9:58 AM and concluded at 11:15 AM for a total of 1 hour 17 minutes.  Sheath: 6 French rabies right common femoral  Contrast: 80 cc  Fluoroscopy Time: 10.4 minutes  Indications: [name] presents with increasing pain of the left lower extremity. Patient's noninvasive studies demonstrated a marked deterioration compared to the previous study. This suggests the patient is having limb threatening ischemia. The risks and benefits are reviewed all questions answered patient agrees to proceed.  Procedure: [name] is a 60 y.o. y.o. male who was identified and appropriate procedural time out was performed. The patient was then placed supine on the table and prepped and draped in the usual sterile fashion.   Ultrasound was placed in the sterile sleeve and  the right groin was evaluated the right common femoral artery was echolucent and pulsatile indicating patency.  Image was recorded for the permanent record and under real-time visualization a microneedle was inserted into the common femoral artery microwire followed by a micro-sheath.  A J-wire was then advanced through the micro-sheath and a  5 [place] sheath was then inserted over a J-wire. J-wire was then advanced and a 5 French pigtail catheter was positioned at the level of T12. AP projection of the aorta was then obtained. Pigtail catheter was repositioned to above the bifurcation and a RAO view of the pelvis was obtained.  Subsequently a pigtail catheter with the stiff angle Glidewire was used to cross the aortic bifurcation the catheter wire were advanced down into the left distal external iliac artery. Oblique view of the femoral bifurcation was then obtained and subsequently the wire was reintroduced and the pigtail catheter negotiated into the SFA representing third order catheter placement. Distal runoff was then performed.  5000 units of heparin was then given and allowed to circulate and a 6 [place] Rabi sheath was advanced up and over the bifurcation and positioned in the femoral artery  Straight catheter and stiff angle Glidewire were then negotiated down into the distal popliteal. Magnified images in both LAO and RAO projections were performed and subsequently a 18 Gold tip glide wire was negotiated into the posterior tibial. Catheter was then advanced. Hand injection contrast demonstrated the posterior tibial in detail. In the midportion there was approximately a 4 cm greater than 60% stenosis. Imaging done from the distal popliteal demonstrated a 3-4 cm long segment with greater than 90% stenosis at the origin of the posterior tibial.   A V-18 wire was then reintroduced and a 2.5 by 4 ultra  versed balloon was used to angioplasty the posterior tibial in both of the above locations. Each inflation  was for 2 minutes at 12 atm. Follow-up imaging demonstrated excellent patency with preservation of the distal runoff.  The detector was then repositioned and the SFA and popliteal at the level of Hunter's canal was reimaged greater than 70% stenosis is noted in this area and after appropriate measurements are made a 6 x 80 Lutonix balloon was used to angioplasty the superficial femoral and popliteal arteries. Inflations were to 12 atmospheres for 2 minutes. Follow-up imaging demonstrated patency with excellent result. Distal runoff was then reassessed and noted to be widely patent.  Lastly a steep LAO projection of the groin was obtained and a 70% diameter reduction at the origin of the SFA was identified this was treated with a 6 x 4 Lutonix balloon inflation was to 12 atm for 2 minutes. Follow-up imaging demonstrates excellent result with less than 20% residual stenosis distal imaging was again performed noting preservation of the runoff.  After review of these images the sheath is pulled into the right external iliac oblique of the common femoral is obtained and a Star close device deployed. There no immediate Complications.  Findings: The abdominal aorta is opacified with a bolus injection contrast. Renal arteries are very small in the right renal artery clearly has a high-grade stenosis at its origin. Nephrograms are very poor. The aorta itself has diffuse disease but no hemodynamically significant lesions. The common and external iliac arteries are widely patent bilaterally.  The left common femoral is widely patent as is the profunda femoris. In the steep oblique there is no ostial stenosis noted at the profunda femoris origin. However the SFA does indeed have a significant stenosis at its origin. There is also a high-grade greater than 70% stenosis at Hunter's canal. The distal popliteal demonstrates increasing disease and the trifurcation is heavily diseased with occlusion of the anterior tibial  peroneal demonstrates a subtotal occlusion in its proximal portion and posterior tibial demonstrates multiple greater than 90% stenoses over the first several centimeters of its course. In the midportion of the posterior tibial there is also greater than 60% lesion as described above. Below this the posterior tibial appears to be quite nice measuring approximately 2.5 mm and filling the lateral plantar and the pedal arch. There is minimal reconstitution of the peroneal.  Following angioplasty to 2.5 mm the posterior tibial now is in-line flow and looks quite nice. This also has recruited the peroneal at its origin and there is now a significant improvement in flow through the peroneal as well. And plasty of the SFA at Hunter's canal yields an excellent result with less than 10% residual stenosis similarly at the origin the SFA.  Summary: Successful recanalization left lower cavity for limb salvage   Disposition: Patient was taken to the recovery room in stable condition having tolerated the procedure well.  [name], Dolores Lory 12/07/2015,11:43 AM

## 2015-12-07 NOTE — Progress Notes (Signed)
Pt here for left leg angiogram, right bka, noting multiple open sores down entire left leg,. Appearing ulceration length of incision from previous cabg, clear dressing over entire area, faint doppler pulses, entire lower portion of left leg below knee area reddened. Also noting patient from [facility] called regarding pt's am meds today, has remained npo as has been given full amt of levemir insulin instead of half dose, blood sugar at 137 now, will confer with Dr Gilda Crease. Will monitor for decreasing blood sugar.

## 2015-12-07 NOTE — H&P (Signed)
[facility] History & Physical Update  The patient was interviewed and re-examined.  The patient's previous History and Physical has been reviewed and is unchanged.  There is no change in the plan of care. We plan to proceed with the scheduled procedure.  Tishie Altmann, Latina Craver, MD  12/07/2015, 11:15 AM

## 2015-12-08 ENCOUNTER — Encounter: Payer: Self-pay | Admitting: Vascular Surgery

## 2015-12-28 ENCOUNTER — Encounter: Payer: Medicare Other | Attending: Internal Medicine | Admitting: Internal Medicine

## 2015-12-28 DIAGNOSIS — Z87891 Personal history of nicotine dependence: Secondary | ICD-10-CM | POA: Insufficient documentation

## 2015-12-28 DIAGNOSIS — I10 Essential (primary) hypertension: Secondary | ICD-10-CM | POA: Insufficient documentation

## 2015-12-28 DIAGNOSIS — I2510 Atherosclerotic heart disease of native coronary artery without angina pectoris: Secondary | ICD-10-CM | POA: Diagnosis not present

## 2015-12-28 DIAGNOSIS — E11621 Type 2 diabetes mellitus with foot ulcer: Secondary | ICD-10-CM | POA: Diagnosis present

## 2015-12-28 DIAGNOSIS — E1151 Type 2 diabetes mellitus with diabetic peripheral angiopathy without gangrene: Secondary | ICD-10-CM | POA: Diagnosis not present

## 2015-12-28 DIAGNOSIS — J449 Chronic obstructive pulmonary disease, unspecified: Secondary | ICD-10-CM | POA: Diagnosis not present

## 2015-12-28 DIAGNOSIS — I509 Heart failure, unspecified: Secondary | ICD-10-CM | POA: Insufficient documentation

## 2015-12-28 DIAGNOSIS — Z89511 Acquired absence of right leg below knee: Secondary | ICD-10-CM | POA: Diagnosis not present

## 2015-12-28 DIAGNOSIS — Z794 Long term (current) use of insulin: Secondary | ICD-10-CM | POA: Diagnosis not present

## 2015-12-28 DIAGNOSIS — L97522 Non-pressure chronic ulcer of other part of left foot with fat layer exposed: Secondary | ICD-10-CM | POA: Diagnosis not present

## 2015-12-29 ENCOUNTER — Other Ambulatory Visit
Admission: RE | Admit: 2015-12-29 | Discharge: 2015-12-29 | Disposition: A | Discharge status: Home / Self Care | Payer: Medicare Other | Source: Ambulatory Visit | Attending: Internal Medicine | Admitting: Internal Medicine

## 2015-12-29 DIAGNOSIS — M868X7 Other osteomyelitis, ankle and foot: Secondary | ICD-10-CM | POA: Diagnosis present

## 2015-12-29 NOTE — Progress Notes (Signed)
ALYX, GEE ([id number]) Visit Report for 12/28/2015 Abuse/Suicide Risk Screen Details Patient Name: [name], [name]. Date of Service: 12/28/2015 8:45 AM Medical Record Patient Account Number: [id number] Number: Treating RN: Clover Mealy, RN, BSN, Rita Feb 24, 1956 [phone] y.o. Other Clinician: Date of Birth/Sex: Male) Treating ROBSON, Courvoisier Primary Care Physician: Joen Laura Physician/Extender: G Referring Physician: Tedra Senegal in Treatment: 0 Abuse/Suicide Risk Screen Items Answer ABUSE/SUICIDE RISK SCREEN: Has anyone close to you tried to hurt or harm you recentlyo No Do you feel uncomfortable with anyone in your familyo No Has anyone forced you do things that you didnot want to doo No Do you have any thoughts of harming yourselfo No Patient displays signs or symptoms of abuse and/or neglect. No Electronic Signature(s) Signed: 12/28/2015 4:45:24 PM By: Elpidio Eric BSN, RN Entered By: Elpidio Eric on 12/28/2015 09:07:58 Dutt, Jencarlos Algis Downs ([id number]) -------------------------------------------------------------------------------- Activities of Daily Living Details Patient Name: [name], [name] Date of Service: 12/28/2015 8:45 AM Medical Record Patient Account Number: [id number] Number: Treating RN: Clover Mealy, RN, BSN, Rita 14-Dec-1955 [phone] y.o. Other Clinician: Date of Birth/Sex: Male) Treating ROBSON, Daequan Primary Care Physician: Joen Laura Physician/Extender: G Referring Physician: Tedra Senegal in Treatment: 0 Activities of Daily Living Items Answer Activities of Daily Living (Please select one for each item) Drive Automobile Not Able Take Medications Need Assistance Use Telephone Need Assistance Care for Appearance Need Assistance Use Toilet Need Assistance Bath / Shower Need Assistance Dress Self Need Assistance Feed Self Need Assistance Walk Need Assistance Get In / Out Bed Need Assistance Housework Need Assistance Prepare Meals Need  Assistance Handle Money Need Assistance Shop for Self Need Assistance Electronic Signature(s) Signed: 12/28/2015 4:45:24 PM By: Elpidio Eric BSN, RN Entered By: Elpidio Eric on 12/28/2015 09:08:21 Flora Lipps ([id number]) -------------------------------------------------------------------------------- Education Assessment Details Patient Name: Tyler Milo D. Date of Service: 12/28/2015 8:45 AM Medical Record Patient Account Number: [id number] Number: Treating RN: Clover Mealy, RN, BSN, Rita April 15, 1956 [phone] y.o. Other Clinician: Date of Birth/Sex: Male) Treating ROBSON, Ayub Primary Care Physician: Joen Laura Physician/Extender: G Referring Physician: Tedra Senegal in Treatment: 0 Primary Learner Assessed: Patient Learning Preferences/Education Level/Primary Language Learning Preference: Explanation Highest Education Level: High School Preferred Language: English Cognitive Barrier Assessment/Beliefs Language Barrier: No Physical Barrier Assessment Impaired Vision: Yes Glasses Impaired Hearing: No Decreased Hand dexterity: No Knowledge/Comprehension Assessment Knowledge Level: Medium Comprehension Level: Medium Ability to understand written Medium instructions: Ability to understand verbal Medium instructions: Motivation Assessment Anxiety Level: Calm Cooperation: Cooperative Education Importance: Acknowledges Need Interest in Health Problems: Asks Questions Perception: Coherent Willingness to Engage in Self- Medium Management Activities: Readiness to Engage in Self- Medium Management Activities: Electronic Signature(s) Signed: 12/28/2015 4:45:24 PM By: Elpidio Eric BSN, RN Entered By: Elpidio Eric on 12/28/2015 09:09:36 Leising, KENNETT SYMES ([id number]) Presas, [name] ([id number]) -------------------------------------------------------------------------------- Fall Risk Assessment Details Patient Name: Tyler Milo D. Date of Service: 12/28/2015 8:45  AM Medical Record Patient Account Number: [id number] Number: Treating RN: Clover Mealy, RN, BSN, Rita 07-19-1956 [phone] y.o. Other Clinician: Date of Birth/Sex: Male) Treating ROBSON, Riyaan Primary Care Physician: Joen Laura Physician/Extender: G Referring Physician: Tedra Senegal in Treatment: 0 Fall Risk Assessment Items Have you had 2 or more falls in the last 12 monthso 0 No Have you had any fall that resulted in injury in the last 12 monthso 0 No FALL RISK ASSESSMENT: History of falling - immediate or within 3 months 0 No Secondary diagnosis 15 Yes Ambulatory aid None/bed rest/wheelchair/nurse 0 Yes Crutches/cane/walker 0  No Furniture 0 No IV Access/Saline Lock 0 No Gait/Training Normal/bed rest/immobile 0 Yes Weak 10 Yes Impaired 0 No Mental Status Oriented to own ability 0 Yes Electronic Signature(s) Signed: 12/28/2015 4:45:24 PM By: Elpidio Eric BSN, RN Entered By: Elpidio Eric on 12/28/2015 09:09:01 Iiams, Gregorio D. ([id number]) -------------------------------------------------------------------------------- Foot Assessment Details Patient Name: [name], [name] Date of Service: 12/28/2015 8:45 AM Medical Record Patient Account Number: [id number] Number: Treating RN: Clover Mealy, RN, BSN, Rita [birth year] [phone] y.o. Other Clinician: Date of Birth/Sex: Male) Treating ROBSON, Tremon Primary Care Physician: Joen Laura Physician/Extender: G Referring Physician: Tedra Senegal in Treatment: 0 Foot Assessment Items Site Locations + = Sensation present, - = Sensation absent, C = Callus, U = Ulcer R = Redness, W = Warmth, M = Maceration, PU = Pre-ulcerative lesion F = Fissure, S = Swelling, D = Dryness Assessment Right: Left: Other Deformity: No No Prior Foot Ulcer: No No Prior Amputation: No No Charcot Joint: No No Ambulatory Status: Ambulatory With Help Assistance Device: Wheelchair Gait: Surveyor, mining) Signed: 12/28/2015  4:45:24 PM By: Elpidio Eric BSN, RN Entered By: Elpidio Eric on 12/28/2015 09:08:34 Gervasi, [name] ([id number]) Kary, Dervin D. ([id number]) -------------------------------------------------------------------------------- Nutrition Risk Assessment Details Patient Name: Roswell, Dahir D. Date of Service: 12/28/2015 8:45 AM Medical Record Patient Account Number: [id number] Number: Treating RN: Clover Mealy, RN, BSN, Rita 1956-06-09 [phone] y.o. Other Clinician: Date of Birth/Sex: Male) Treating ROBSON, Gillian Primary Care Physician: Joen Laura Physician/Extender: G Referring Physician: Tedra Senegal in Treatment: 0 Height (in): Weight (lbs): Body Mass Index (BMI): Nutrition Risk Assessment Items NUTRITION RISK SCREEN: I have an illness or condition that made me change the kind and/or 0 No amount of food I eat I eat fewer than two meals per day 0 No I eat few fruits and vegetables, or milk products 0 No I have three or more drinks of beer, liquor or wine almost every day 0 No I have tooth or mouth problems that make it hard for me to eat 0 No I don't always have enough money to buy the food I need 0 No I eat alone most of the time 0 No I take three or more different prescribed or over-the-counter drugs a 0 No day Without wanting to, I have lost or gained 10 pounds in the last six 0 No months I am not always physically able to shop, cook and/or feed myself 0 No Nutrition Protocols Good Risk Protocol 0 No interventions needed Moderate Risk Protocol Electronic Signature(s) Signed: 12/28/2015 4:45:24 PM By: Elpidio Eric BSN, RN Entered By: Elpidio Eric on 12/28/2015 09:08:45

## 2015-12-30 NOTE — Progress Notes (Signed)
AJMAL, KATHAN ([id number]) Visit Report for 12/28/2015 Chief Complaint Document Details Patient Name: [name], [name]. Date of Service: 12/28/2015 8:45 AM Medical Record Patient Account Number: [id number] Number: Treating RN: Clover Mealy, RN, BSN, Rita 01-01-1956 [phone] y.o. Other Clinician: Date of Birth/Sex: Male) Treating Thara Searing, Kelby Primary Care Physician: Joen Laura Physician/Extender: G Referring Physician: Tedra Senegal in Treatment: 0 Information Obtained from: Patient Chief Complaint Patient is here for review of a wound on his left first metatarsal head that is been there for the last 6 months Electronic Signature(s) Signed: 12/29/2015 5:04:49 PM By: Baltazar Najjar MD Entered By: Baltazar Najjar on 12/28/2015 13:05:33 Medero, Hillary D. ([id number]) -------------------------------------------------------------------------------- Debridement Details Patient Name: Tyler Milo D. Date of Service: 12/28/2015 8:45 AM Medical Record Patient Account Number: [id number] Number: Treating RN: Clover Mealy, RN, BSN, Rita 1956-04-12 [phone] y.o. Other Clinician: Date of Birth/Sex: Male) Treating Adom Schoeneck, Ike Primary Care Physician: Joen Laura Physician/Extender: G Referring Physician: Tedra Senegal in Treatment: 0 Debridement Performed for Wound #2 Left,Plantar Foot Assessment: Performed By: Physician [name], MD Debridement: Debridement Pre-procedure Yes Verification/Time Out Taken: Start Time: 09:45 Pain Control: Lidocaine 4% Topical Solution Level: Skin/Subcutaneous Tissue Total Area Debrided (L x 1 (cm) x 1 (cm) = 1 (cm) W): Tissue and other Non-Viable, Callus, Fibrin/Slough, Subcutaneous material debrided: Instrument: Blade, Forceps Bleeding: Minimum Hemostasis Achieved: Pressure End Time: 09:50 Procedural Pain: 0 Post Procedural Pain: 0 Response to Treatment: Procedure was tolerated well Post Debridement Measurements of Total  Wound Length: (cm) 2 Width: (cm) 2 Depth: (cm) 0.1 Volume: (cm) 0.314 Post Procedure Diagnosis Same as Pre-procedure Electronic Signature(s) Signed: 12/28/2015 4:45:24 PM By: Elpidio Eric BSN, RN Signed: 12/29/2015 5:04:49 PM By: Baltazar Najjar MD Entered By: Baltazar Najjar on 12/28/2015 13:05:05 Mentel, [name] ([id number]) -------------------------------------------------------------------------------- HPI Details Patient Name: Tyler Milo D. Date of Service: 12/28/2015 8:45 AM Medical Record Patient Account Number: [id number] Number: Treating RN: Clover Mealy, RN, BSN, Rita 09/27/1956 [phone] y.o. Other Clinician: Date of Birth/Sex: Male) Treating Stevenson Windmiller, Jaydeen Primary Care Physician: Joen Laura Physician/Extender: G Referring Physician: Tedra Senegal in Treatment: 0 History of Present Illness HPI Description: 12/28/15. This patient is a type II diabetic on insulin. He had vascular interventions 2 weeks ago on 1/17. This included an angioplasty of the left superficial femoral and popliteal arteries as well as a percutaneous transluminal angioplasty of the left posterior tibial artery. Nevertheless he is here for a plantar wound that the patient says is been there for the last 6 months. He initially told us that he is applying Neosporin to this however he tells me also that he has been in a nursing home for the last year. I found that to be a somewhat on usual combination of fax. In any case the patient is a diabetic with diabetic PAD. 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 His ABI on the left side today was 0.62 Electronic Signature(s) Signed: 12/29/2015 5:04:49 PM By: Baltazar Najjar MD Entered By: Baltazar Najjar on 12/28/2015 13:08:53 [name], [name] ([id number]) -------------------------------------------------------------------------------- Physical Exam Details Patient Name: [name], [name] Date of Service: 12/28/2015 8:45  AM Medical Record Patient Account Number: [id number] Number: Treating RN: Clover Mealy, RN, BSN, Rita 09-Feb-1956 [phone] y.o. Other Clinician: Date of Birth/Sex: Male) Treating Ross Hefferan, Ryker Primary Care Physician: Joen Laura Physician/Extender: G Referring Physician: Tedra Senegal in Treatment: 0 Constitutional Sitting or standing Blood Pressure is within target range for patient.. Supine Blood Pressure is  within target range for patient.. Pulse regular and within target range for patient.. Temperature is normal and within the target range for the patient.Marland Kitchen Respiratory Respiratory effort is easy and symmetric bilaterally. Rate is normal at rest and on room air.. Bilateral breath sounds are clear and equal in all lobes with no wheezes, rales or rhonchi.. Cardiovascular Heart rhythm and rate regular, without murmur or gallop.. Pedal pulses absent bilaterally.. Edema present in both extremities. The edema is mild. He has superficial erythema and excoriations on his left anterior leg.. Gastrointestinal (GI) Abdomen is soft and non-distended without masses or tenderness. Bowel sounds active in all quadrants.. No liver or spleen enlargement or tenderness.. Notes Wound exam; the area in question is over his left first metatarsal head. This underwent a surgical debridement there is no erythema around this wound and no evidence of infection although it probes deeply perhaps to the joint capsule. I did a culture of this Electronic Signature(s) Signed: 12/29/2015 5:04:49 PM By: Baltazar Najjar MD Entered By: Baltazar Najjar on 12/28/2015 13:10:47 Biernat, [name] ([id number]) -------------------------------------------------------------------------------- Physician Orders Details Patient Name: Tyler Milo D. Date of Service: 12/28/2015 8:45 AM Medical Record Patient Account Number: [id number] Number: Treating RN: Clover Mealy, RN, BSN, Rita 1956/06/21 [phone] y.o. Other  Clinician: Date of Birth/Sex: Male) Treating Garrie Elenes, Rook Primary Care Physician: Joen Laura Physician/Extender: G Referring Physician: Tedra Senegal in Treatment: 0 Verbal / Phone Orders: Yes Clinician: Afful, RN, BSN, Rita Read Back and Verified: Yes Diagnosis Coding Wound Cleansing Wound #1 Left,Anterior Lower Leg o Clean wound with Normal Saline. Wound #2 Left,Plantar Foot o Clean wound with Normal Saline. Anesthetic Wound #1 Left,Anterior Lower Leg o Topical Lidocaine 4% cream applied to wound bed prior to debridement Wound #2 Left,Plantar Foot o Topical Lidocaine 4% cream applied to wound bed prior to debridement Primary Wound Dressing Wound #1 Left,Anterior Lower Leg o Aquacel Ag Wound #2 Left,Plantar Foot o Aquacel Ag Secondary Dressing Wound #1 Left,Anterior Lower Leg o Gauze and Kerlix/Conform Wound #2 Left,Plantar Foot o Gauze and Kerlix/Conform Dressing Change Frequency Wound #1 Left,Anterior Lower Leg o Change dressing every other day. Wound #2 Left,Plantar Foot o Change dressing every other day. TERRYN, REDNER ([id number]) Follow-up Appointments Wound #1 Left,Anterior Lower Leg o Return Appointment in 1 week. Wound #2 Left,Plantar Foot o Return Appointment in 1 week. Additional Orders / Instructions Wound #1 Left,Anterior Lower Leg o Increase protein intake. o Activity as tolerated Wound #2 Left,Plantar Foot o Increase protein intake. o Activity as tolerated Radiology o X-ray, foot - AP lateral questionable osteomylitis. Nursing home to get Xray done. Please send copy of results with patient to his next visit. [employer] o Culture and Copywriter, advertising) Signed: 12/28/2015 10:25:40 AM By: Elpidio Eric BSN, RN Signed: 12/29/2015 5:04:49 PM By: Baltazar Najjar MD Previous Signature: 12/28/2015 10:10:16 AM Version By: Elpidio Eric BSN, RN Entered By: Elpidio Eric on 12/28/2015  10:25:39 [place], Attila Algis Downs ([id number]) -------------------------------------------------------------------------------- Problem List Details Patient Name: Konieczny, Quashaun D. Date of Service: 12/28/2015 8:45 AM Medical Record Patient Account Number: [id number] Number: Treating RN: Clover Mealy, RN, BSN, Rita 07-28-1956 [phone] y.o. 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 ([id number]) -------------------------------------------------------------------------------- Progress Note Details Patient Name: [name], [name] Date of Service: 12/28/2015 8:45 AM Medical Record Patient Account Number: [id number] Number: Treating RN: Clover Mealy, RN, BSN, Rita 02/09/1956 [phone] y.o. Other Clinician: Date of Birth/Sex: Male) Treating Jessamyn Watterson, Jahking Primary Care Physician: Joen Laura Physician/Extender: G Referring Physician: Tedra Senegal in Treatment: 0 Subjective Chief Complaint Information obtained from Patient Patient is here for review of a wound on his left first metatarsal head that is been there for the last 6 months History of Present Illness (HPI) 12/28/15. This patient is a type II diabetic on insulin. He had vascular interventions 2 weeks ago on 1/17. This included an angioplasty of the left superficial femoral and popliteal  arteries as well as a percutaneous transluminal angioplasty of the left posterior tibial artery. Nevertheless he is here for a plantar wound that the patient says is been there for the last 6 months. He initially told us that he is applying Neosporin to this however he tells me also that he has been in a nursing home for the last year. I found that to be a somewhat on usual combination of fax. In any case the patient is a diabetic with diabetic PAD. 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 His ABI on the left side today was 0.62 Wound History Patient presents with 2 open wounds that have been present for approximately years. Patient has been treating wounds in the following manner: dry dressing. Laboratory tests have not been performed in the last month. Patient reportedly has not tested positive for an antibiotic resistant organism. Patient reportedly has not tested positive for osteomyelitis. Patient reportedly has not had testing performed to evaluate circulation in the legs. Patient experiences the following problems associated with their wounds: swelling. Patient History Information obtained from Patient. Allergies no known allergies Family History Heart Disease - Siblings, Father, Hypertension - Siblings, Father, No family history of Cancer, Diabetes, Hereditary Spherocytosis, Kidney Disease, Lung Disease, Seizures, Stroke, Thyroid Problems, Tuberculosis. Social History Former smoker, Marital Status - Single, Alcohol Use - Never, Drug Use - No History, Caffeine Use - Daily. LINAS, STEPTER ([id number]) Medical History Eyes Denies history of Cataracts, Glaucoma, Optic Neuritis Ear/Nose/Mouth/Throat Denies history of Chronic sinus problems/congestion, Middle ear problems Hematologic/Lymphatic Patient has history of Anemia Denies history of Hemophilia, Human Immunodeficiency Virus, Lymphedema, Sickle Cell Disease Respiratory Patient has history  of Chronic Obstructive Pulmonary Disease (COPD) Denies history of Aspiration Cardiovascular Patient has history of Arrhythmia, Congestive Heart Failure, Coronary Artery Disease, Hypertension Gastrointestinal Denies history of Cirrhosis , Colitis, Crohn s, Hepatitis A, Hepatitis B, Hepatitis C Endocrine Patient has history of Type II Diabetes Genitourinary Denies history of End Stage Renal Disease Immunological Denies history of Lupus Erythematosus, Raynaud s, Scleroderma Integumentary (Skin) Denies history of History of Burn, History of pressure wounds Musculoskeletal Denies history of Gout, Rheumatoid Arthritis, Osteoarthritis Neurologic Patient has history of Neuropathy - diabetic Denies history of Dementia, Quadriplegia, Paraplegia, Seizure Disorder Oncologic Denies history of Received Chemotherapy, Received Radiation Psychiatric Denies history of Anorexia/bulimia, Confinement Anxiety Patient is treated with Insulin. Blood sugar is tested. Blood sugar results noted at the following times: Breakfast - 182. Medical And Surgical History Notes Musculoskeletal rhabdomylysis Review of Systems (ROS) Constitutional Symptoms (General Health) The patient has no  complaints or symptoms. Eyes Complains or has symptoms of Glasses / Contacts. Ear/Nose/Mouth/Throat The patient has no complaints or symptoms. Hematologic/Lymphatic The patient has no complaints or symptoms. Respiratory The patient has no complaints or symptoms. SHO, SALGUERO ([id number]) Cardiovascular The patient has no complaints or symptoms. Gastrointestinal The patient has no complaints or symptoms. Endocrine The patient has no complaints or symptoms. Genitourinary The patient has no complaints or symptoms. Immunological The patient has no complaints or symptoms. Integumentary (Skin) Complains or has symptoms of Wounds, Breakdown, Swelling. Musculoskeletal The patient has no complaints or  symptoms. Neurologic The patient has no complaints or symptoms. Oncologic The patient has no complaints or symptoms. Psychiatric Denies complaints or symptoms of Anxiety. Objective Constitutional Sitting or standing Blood Pressure is within target range for patient.. Supine Blood Pressure is within target range for patient.. Pulse regular and within target range for patient.. Temperature is normal and within the target range for the patient.. Vitals Time Taken: 9:09 AM, Height: 67 in, Source: Stated, Weight: 195 lbs, Source: Stated, BMI: 30.5, Temperature: 98.6 F, Pulse: 65 bpm, Respiratory Rate: 18 breaths/min, Blood Pressure: 145/61 mmHg. Respiratory Respiratory effort is easy and symmetric bilaterally. Rate is normal at rest and on room air.. Bilateral breath sounds are clear and equal in all lobes with no wheezes, rales or rhonchi.. Cardiovascular Heart rhythm and rate regular, without murmur or gallop.. Pedal pulses absent bilaterally.. Edema present in both extremities. The edema is mild. He has superficial erythema and excoriations on his left anterior leg.. Gastrointestinal (GI) Abdomen is soft and non-distended without masses or tenderness. Bowel sounds active in all quadrants.. No liver or spleen enlargement or tenderness.Marland Kitchen [name], [name] ([id number]) General Notes: Wound exam; the area in question is over his left first metatarsal head. This underwent a surgical debridement there is no erythema around this wound and no evidence of infection although it probes deeply perhaps to the joint capsule. I did a culture of this Integumentary (Hair, Skin) Wound #1 status is Open. Original cause of wound was Gradually Appeared. The wound is located on the Left,Anterior Lower Leg. The wound measures 15cm length x 11cm width x 0.1cm depth; 129.591cm^2 area and 12.959cm^3 volume. The wound is limited to skin breakdown. There is no tunneling or undermining noted. There is a small amount  of serosanguineous drainage noted. The wound margin is distinct with the outline attached to the wound base. There is medium (34-66%) pink granulation within the wound bed. There is a small (1-33%) amount of necrotic tissue within the wound bed including Adherent Slough. The periwound skin appearance exhibited: Localized Edema, Moist. The periwound skin appearance did not exhibit: Callus, Crepitus, Excoriation, Fluctuance, Friable, Induration, Rash, Scarring, Dry/Scaly, Maceration, Atrophie Blanche, Cyanosis, Ecchymosis, Hemosiderin Staining, Mottled, Pallor, Rubor, Erythema. Periwound temperature was noted as No Abnormality. Wound #2 status is Open. Original cause of wound was Gradually Appeared. The wound is located on the Left,Plantar Foot. The wound measures 1cm length x 1cm width x 0.3cm depth; 0.785cm^2 area and 0.236cm^3 volume. The wound is limited to skin breakdown. There is no tunneling or undermining noted. There is a small amount of serosanguineous drainage noted. The wound margin is distinct with the outline attached to the wound base. There is medium (34-66%) pink granulation within the wound bed. There is a small (1-33%) amount of necrotic tissue within the wound bed including Adherent Slough. The periwound skin appearance exhibited: Callus, Dry/Scaly, Moist. The periwound skin appearance did not exhibit: Crepitus, Excoriation, Fluctuance, Friable, Induration, Localized  Edema, Rash, Scarring, Maceration, Atrophie Blanche, Cyanosis, Ecchymosis, Hemosiderin Staining, Mottled, Pallor, Rubor, Erythema. Periwound temperature was noted as No Abnormality. Assessment Active Problems ICD-10 L97.522 - Non-pressure chronic ulcer of other part of left foot with fat layer exposed E11.621 - Type 2 diabetes mellitus with foot ulcer E11.51 - Type 2 diabetes mellitus with diabetic peripheral angiopathy without gangrene Procedures Wound #2 Wound #2 is a Diabetic Wound/Ulcer of the Lower  Extremity located on the Left,Plantar Foot . There was a Skin/Subcutaneous Tissue Debridement ([id number]) debridement with total area of 1 sq cm performed by [name], MD. with the following instrument(s): Blade and Forceps to remove Non-Viable [name], [name] ([id number]) tissue/material including Fibrin/Slough, Callus, and Subcutaneous after achieving pain control using Lidocaine 4% Topical Solution. A time out was conducted prior to the start of the procedure. A Minimum amount of bleeding was controlled with Pressure. The procedure was tolerated well with a pain level of 0 throughout and a pain level of 0 following the procedure. Post Debridement Measurements: 2cm length x 2cm width x 0.1cm depth; 0.314cm^3 volume. Post procedure Diagnosis Wound #2: Same as Pre-Procedure Plan Wound Cleansing: Wound #1 Left,Anterior Lower Leg: Clean wound with Normal Saline. Wound #2 Left,Plantar Foot: Clean wound with Normal Saline. Anesthetic: Wound #1 Left,Anterior Lower Leg: Topical Lidocaine 4% cream applied to wound bed prior to debridement Wound #2 Left,Plantar Foot: Topical Lidocaine 4% cream applied to wound bed prior to debridement Primary Wound Dressing: Wound #1 Left,Anterior Lower Leg: Aquacel Ag Wound #2 Left,Plantar Foot: Aquacel Ag Secondary Dressing: Wound #1 Left,Anterior Lower Leg: Gauze and Kerlix/Conform Wound #2 Left,Plantar Foot: Gauze and Kerlix/Conform Dressing Change Frequency: Wound #1 Left,Anterior Lower Leg: Change dressing every other day. Wound #2 Left,Plantar Foot: Change dressing every other day. Follow-up Appointments: Wound #1 Left,Anterior Lower Leg: Return Appointment in 1 week. Wound #2 Left,Plantar Foot: Return Appointment in 1 week. Additional Orders / Instructions: Wound #1 Left,Anterior Lower Leg: Increase protein intake. Activity as tolerated Wound #2 Left,Plantar Foot: Increase protein intake. [name], [name] ([id number]) Activity  as tolerated Radiology ordered were: X-ray, foot - AP lateral questionable osteomylitis. Nursing home to get Xray done. Please send copy of results with patient to his next visit. ordered were: Culture and sensitivity We ordered Aquacel Ag to the wound area. Ordered an x-ray at the nursing home rule out osteomyelitis. The patient is barely ambulatory. For now I did not provide any further offloading. He did not given. Antibiotics but will await for culture results Electronic Signature(s) Signed: 12/29/2015 5:04:49 PM By: Baltazar Najjar MD Entered By: Baltazar Najjar on 12/28/2015 13:12:12 [place], [name] ([id number]) -------------------------------------------------------------------------------- ROS/PFSH Details Patient Name: Tyler Milo D. Date of Service: 12/28/2015 8:45 AM Medical Record Patient Account Number: [id number] Number: Treating RN: Clover Mealy, RN, BSN, Rita 1956/08/22 [phone] y.o. Other Clinician: Date of Birth/Sex: Male) Treating Miles Borkowski, Lanis Primary Care Physician: Joen Laura Physician/Extender: G Referring Physician: Tedra Senegal in Treatment: 0 Information Obtained From Patient Wound History Do you currently have one or more open woundso Yes How many open wounds do you currently haveo 2 Approximately how long have you had your woundso years How have you been treating your wound(s) until nowo dry dressing Has your wound(s) ever healed and then re-openedo No Have you had any lab work done in the past montho No Have you tested positive for an antibiotic resistant organism (MRSA, VRE)o No Have you tested positive for osteomyelitis (bone infection)o No Have you had any tests for circulation on  your legso No Have you had other problems associated with your woundso Swelling Eyes Complaints and Symptoms: Positive for: Glasses / Contacts Medical History: Negative for: Cataracts; Glaucoma; Optic Neuritis Integumentary (Skin) Complaints and  Symptoms: Positive for: Wounds; Breakdown; Swelling Medical History: Negative for: History of Burn; History of pressure wounds Psychiatric Complaints and Symptoms: Negative for: Anxiety Medical History: Negative for: Anorexia/bulimia; Confinement Anxiety Constitutional Symptoms (General Health) [name], [name] ([id number]) Complaints and Symptoms: No Complaints or Symptoms Ear/Nose/Mouth/Throat Complaints and Symptoms: No Complaints or Symptoms Medical History: Negative for: Chronic sinus problems/congestion; Middle ear problems Hematologic/Lymphatic Complaints and Symptoms: No Complaints or Symptoms Medical History: Positive for: Anemia Negative for: Hemophilia; Human Immunodeficiency Virus; Lymphedema; Sickle Cell Disease Respiratory Complaints and Symptoms: No Complaints or Symptoms Medical History: Positive for: Chronic Obstructive Pulmonary Disease (COPD) Negative for: Aspiration Cardiovascular Complaints and Symptoms: No Complaints or Symptoms Medical History: Positive for: Arrhythmia; Congestive Heart Failure; Coronary Artery Disease; Hypertension Gastrointestinal Complaints and Symptoms: No Complaints or Symptoms Medical History: Negative for: Cirrhosis ; Colitis; Crohnos; Hepatitis A; Hepatitis B; Hepatitis C Endocrine Complaints and Symptoms: No Complaints or Symptoms Medical History: Positive for: Type II Diabetes [name], [name] ([id number]) Time with diabetes: 15 years Treated with: Insulin Blood sugar tested every day: Yes Tested : 3x Blood sugar testing results: Breakfast: 182 Genitourinary Complaints and Symptoms: No Complaints or Symptoms Medical History: Negative for: End Stage Renal Disease Immunological Complaints and Symptoms: No Complaints or Symptoms Medical History: Negative for: Lupus Erythematosus; Raynaudos; Scleroderma Musculoskeletal Complaints and Symptoms: No Complaints or Symptoms Medical History: Negative for: Gout;  Rheumatoid Arthritis; Osteoarthritis Past Medical History Notes: rhabdomylysis Neurologic Complaints and Symptoms: No Complaints or Symptoms Medical History: Positive for: Neuropathy - diabetic Negative for: Dementia; Quadriplegia; Paraplegia; Seizure Disorder Oncologic Complaints and Symptoms: No Complaints or Symptoms Medical History: Negative for: Received Chemotherapy; Received Radiation Family and Social History [name], AMSLER. ([id number]) Cancer: No; Diabetes: No; Heart Disease: Yes - Siblings, Father; Hereditary Spherocytosis: No; Hypertension: Yes - Siblings, Father; Kidney Disease: No; Lung Disease: No; Seizures: No; Stroke: No; Thyroid Problems: No; Tuberculosis: No; Former smoker; Marital Status - Single; Alcohol Use: Never; Drug Use: No History; Caffeine Use: Daily; Financial Concerns: No; Food, Clothing or Shelter Needs: No; Support System Lacking: No; Transportation Concerns: No; Do not resuscitate: Yes Electronic Signature(s) Signed: 12/28/2015 4:45:24 PM By: Elpidio Eric BSN, RN Signed: 12/29/2015 5:04:49 PM By: Baltazar Najjar MD Entered By: Elpidio Eric on 12/28/2015 09:18:54 [place], Aidric DMarland Kitchen ([id number]) -------------------------------------------------------------------------------- SuperBill Details Patient Name: Tyler Milo D. Date of Service: 12/28/2015 Medical Record Patient Account Number: [id number] Number: Treating RN: Clover Mealy, RN, BSN, Rita 04-27-1956 [phone] y.o. Other Clinician: Date of Birth/Sex: Male) Treating Ariahna Smiddy, Elizjah Primary Care Physician: Joen Laura Physician/Extender: G Referring Physician: Tedra [place] in Treatment: 0 Diagnosis Coding ICD-10 Codes Code Description [phone] Non-pressure chronic ulcer of other part of left foot with fat layer exposed E11.621 Type 2 diabetes mellitus with foot ulcer E11.51 Type 2 diabetes mellitus with diabetic peripheral angiopathy without gangrene Facility Procedures CPT4 Code Description:  [id number] - WOUND CARE VISIT-LEV 3 EST PT Modifier: Quantity: 1 CPT4 Code Description: [id number] 11042 - DEB SUBQ TISSUE 20 SQ CM/< ICD-10 Description Diagnosis L97.522 Non-pressure chronic ulcer of other part of left foot Modifier: with fat lay Quantity: 1 er exposed Physician Procedures CPT4 Code Description: [id number] WC PHYS LEVEL 3 o NEW PT ICD-10 Description Diagnosis L97.522 Non-pressure chronic ulcer of other part of left foot Modifier: 25 with fat laye Quantity:  1 r exposed CPT4 Code Description: [id number] 11042 - WC PHYS SUBQ TISS 20 SQ CM ICD-10 Description Diagnosis L97.522 Non-pressure chronic ulcer of other part of left foot Modifier: with fat laye Quantity: 1 r exposed Electronic Signature(s) Signed: 12/29/2015 5:04:49 PM By: Baltazar Najjar MD Entered By: Baltazar Najjar on 12/28/2015 13:12:49

## 2015-12-30 NOTE — Progress Notes (Signed)
YOAN, SALLADE ([id number]) Visit Report for 12/28/2015 Allergy List Details Patient Name: [name], [name]. Date of Service: 12/28/2015 8:45 AM Medical Record Number: [id number] Patient Account Number: [id number] Date of Birth/Sex: Jan 17, 1956 (61 y.o. Male) Treating RN: Clover Mealy, RN, BSN, [place] Sink Primary Care Physician: Joen Laura Other Clinician: Referring Physician: Joen Laura Treating Physician/Extender: [name] Weeks in Treatment: 0 Allergies Active Allergies no known allergies Allergy Notes Electronic Signature(s) Signed: 12/28/2015 4:45:24 PM By: Elpidio Eric BSN, RN Entered By: Elpidio Eric on 12/28/2015 09:10:58 Ammar, [name] ([id number]) -------------------------------------------------------------------------------- Arrival Information Details Patient Name: Tyler Milo D. Date of Service: 12/28/2015 8:45 AM Medical Record Number: [id number] Patient Account Number: [id number] Date of Birth/Sex: 1955/12/15 (60 y.o. Male) Treating RN: Clover Mealy, RN, BSN, [place] Sink Primary Care Physician: Joen Laura Other Clinician: Referring Physician: Joen Laura Treating Physician/Extender: Altamese [place] in Treatment: 0 Visit Information Patient Arrived: Wheel Chair Arrival Time: 09:04 Accompanied By: self Transfer Assistance: None Patient Identification Verified: Yes Secondary Verification Process Yes Completed: Patient Requires Transmission-Based No Precautions: Patient Has Alerts: No Electronic Signature(s) Signed: 12/28/2015 4:45:24 PM By: Elpidio Eric BSN, RN Entered By: Elpidio Eric on 12/28/2015 09:07:25 [name], [name] ([id number]) -------------------------------------------------------------------------------- Clinic Level of Care Assessment Details Patient Name: Tyler Milo D. Date of Service: 12/28/2015 8:45 AM Medical Record Number: [id number] Patient Account Number: [id number] Date of Birth/Sex: 1956/02/05 (60 y.o. Male) Treating RN: Clover Mealy, RN, BSN,  [place] Sink Primary Care Physician: Joen Laura Other Clinician: Referring Physician: Joen Laura Treating Physician/Extender: Altamese [place] in Treatment: 0 Clinic Level of Care Assessment Items TOOL 1 Quantity Score  - Use when EandM and Procedure is performed on INITIAL visit 0 ASSESSMENTS - Nursing Assessment / Reassessment X - General Physical Exam (combine w/ comprehensive assessment (listed just 1 20 below) when performed on new pt. evals) X - Comprehensive Assessment (HX, ROS, Risk Assessments, Wounds Hx, etc.) 1 25 ASSESSMENTS - Wound and Skin Assessment / Reassessment  - Dermatologic / Skin Assessment (not related to wound area) 0 ASSESSMENTS - Ostomy and/or Continence Assessment and Care  - Incontinence Assessment and Management 0  - Ostomy Care Assessment and Management (repouching, etc.) 0 PROCESS - Coordination of Care X - Simple Patient / Family Education for ongoing care 1 15  - Complex (extensive) Patient / Family Education for ongoing care 0 X - Staff obtains Chiropractor, Records, Test Results / Process Orders 1 10 X - Staff telephones HHA, Nursing Homes / Clarify orders / etc 1 10  - Routine Transfer to another Facility (non-emergent condition) 0  - Routine Hospital Admission (non-emergent condition) 0 X - New Admissions / Manufacturing engineer / Ordering NPWT, Apligraf, etc. 1 15  - Emergency Hospital Admission (emergent condition) 0 PROCESS - Special Needs  - Pediatric / Minor Patient Management 0  - Isolation Patient Management 0 Frank, [name] ([id number])  - Hearing / Language / Visual special needs 0  - Assessment of Community assistance (transportation, D/C planning, etc.) 0  - Additional assistance / Altered mentation 0  - Support Surface(s) Assessment (bed, cushion, seat, etc.) 0 INTERVENTIONS - Miscellaneous  - External ear exam 0  - Patient Transfer (multiple staff / Nurse, adult / Similar devices) 0  - Simple  Staple / Suture removal (25 or less) 0  - Complex Staple / Suture removal (26 or more) 0  - Hypo/Hyperglycemic Management (do not check if billed separately) 0 X - Ankle / Brachial Index (ABI) - do not check if billed separately 1 15  Has the patient been seen at the hospital within the last three years: Yes Total Score: 110 Level Of Care: New/Established - Level 3 Electronic Signature(s) Signed: 12/28/2015 4:45:24 PM By: Elpidio Eric BSN, RN Entered By: Elpidio Eric on 12/28/2015 09:56:42 [name], [name] ([id number]) -------------------------------------------------------------------------------- Encounter Discharge Information Details Patient Name: Tyler Milo D. Date of Service: 12/28/2015 8:45 AM Medical Record Number: [id number] Patient Account Number: [id number] Date of Birth/Sex: May 02, 1956 (60 y.o. Male) Treating RN: Clover Mealy, RN, BSN, [place] Sink Primary Care Physician: Joen Laura Other Clinician: Referring Physician: Joen Laura Treating Physician/Extender: Altamese [place] in Treatment: 0 Encounter Discharge Information Items Discharge Pain Level: 0 Discharge Condition: Stable Ambulatory Status: Wheelchair Nursing Discharge Destination: Home Transportation: Other Accompanied By: self Schedule Follow-up Appointment: No Medication Reconciliation completed No and provided to Patient/Care Nakoma Gotwalt: Clinical Summary of Care: Electronic Signature(s) Signed: 12/28/2015 4:45:24 PM By: Elpidio Eric BSN, RN Entered By: Elpidio Eric on 12/28/2015 09:44:27 [name], [name] ([id number]) -------------------------------------------------------------------------------- Lower Extremity Assessment Details Patient Name: [name], [name] Date of Service: 12/28/2015 8:45 AM Medical Record Number: [id number] Patient Account Number: [id number] Date of Birth/Sex: Apr 19, 1956 (60 y.o. Male) Treating RN: Clover Mealy, RN, BSN, [place] Sink Primary Care Physician: Joen Laura Other Clinician: Referring  Physician: Joen Laura Treating Physician/Extender: [name] Weeks in Treatment: 0 Edema Assessment Assessed: [Left: No] [Right: No] E[Left: dema] [Right: :] Calf Left: Right: Point of Measurement: 34 cm From Medial Instep 35.5 cm cm Ankle Left: Right: Point of Measurement: 8 cm From Medial Instep 21 cm cm Vascular Assessment Pulses: Posterior Tibial Dorsalis Pedis Palpable: [Left:Yes] Extremity colors, hair growth, and conditions: Extremity Color: [Left:Mottled] Hair Growth on Extremity: [Left:No] Temperature of Extremity: [Left:Warm] Capillary Refill: [Left:< 3 seconds] Blood Pressure: Brachial: [Left:145] Dorsalis Pedis: 90 [Left:Dorsalis Pedis:] Ankle: Posterior Tibial: [Left:Posterior Tibial: 0.62] Toe Nail Assessment Left: Right: Thick: Yes Discolored: Yes Deformed: No Improper Length and Hygiene: Yes Electronic Signature(s) Signed: 12/28/2015 4:45:24 PM By: Elpidio Eric BSN, RN [name], [name] ([id number]) Entered By: Elpidio Eric on 12/28/2015 09:28:15 [name], [name] ([id number]) -------------------------------------------------------------------------------- Multi Wound Chart Details Patient Name: [name], [name] Date of Service: 12/28/2015 8:45 AM Medical Record Number: [id number] Patient Account Number: [id number] Date of Birth/Sex: [birth year] (61 y.o. Male) Treating RN: Clover Mealy, RN, BSN, [place] Sink Primary Care Physician: Joen Laura Other Clinician: Referring Physician: Joen Laura Treating Physician/Extender: [name] Weeks in Treatment: 0 Vital Signs Height(in): 67 Pulse(bpm): 65 Weight(lbs): 195 Blood Pressure 145/61 (mmHg): Body Mass Index(BMI): 31 Temperature(F): 98.6 Respiratory Rate 18 (breaths/min): Photos: [1:No Photos] [2:No Photos] [N/A:N/A] Wound Location: [1:Left Lower Leg - Anterior] [2:Right Foot - Plantar] [N/A:N/A] Wounding Event: [1:Gradually Appeared] [2:Gradually Appeared] [N/A:N/A] Primary Etiology: [1:Diabetic  Wound/Ulcer of the Lower Extremity] [2:Diabetic Wound/Ulcer of the Lower Extremity] [N/A:N/A] Comorbid History: [1:Anemia, Chronic Obstructive Pulmonary Disease (COPD), Arrhythmia, Congestive Heart Failure, Coronary Artery Disease, Hypertension, Type II Diabetes, Neuropathy] [2:Anemia, Chronic Obstructive Pulmonary Disease (COPD), Arrhythmia,  Congestive Heart Failure, Coronary Artery Disease, Hypertension, Type II Diabetes, Neuropathy] [N/A:N/A] Date Acquired: [1:12/21/2014] [2:12/21/2014] [N/A:N/A] Weeks of Treatment: [1:0] [2:0] [N/A:N/A] Wound Status: [1:Open] [2:Open] [N/A:N/A] Measurements L x W x D 15x11x0.1 [2:1x1x0.3] [N/A:N/A] (cm) Area (cm) : [1:129.591] [2:0.785] [N/A:N/A] Volume (cm) : [1:12.959] [2:0.236] [N/A:N/A] % Reduction in Area: [1:0.00%] [2:0.00%] [N/A:N/A] % Reduction in Volume: 0.00% [2:0.00%] [N/A:N/A] Classification: [1:Grade 0] [2:Grade 1] [N/A:N/A] Exudate Amount: [1:Small] [2:Small] [N/A:N/A] Exudate Type: [1:Serosanguineous] [2:Serosanguineous] [N/A:N/A] Exudate Color: [1:red, brown] [2:red, brown] [N/A:N/A] Wound Margin: [1:Distinct, outline attached] [2:Distinct, outline attached] [N/A:N/A] Granulation Amount: [1:Medium (34-66%)] [2:Medium (  34-66%)] [N/A:N/A] Granulation Quality: [1:Pink] [2:Pink] [N/A:N/A] Necrotic Amount: [1:Small (1-33%)] [2:Small (1-33%)] [N/A:N/A] Exposed Structures: Fascia: No Fascia: No N/A Fat: No Fat: No Tendon: No Tendon: No Muscle: No Muscle: No Joint: No Joint: No Bone: No Bone: No Limited to Skin Limited to Skin Breakdown Breakdown Epithelialization: None None N/A Periwound Skin Texture: Edema: Yes Callus: Yes N/A Excoriation: No Edema: No Induration: No Excoriation: No Callus: No Induration: No Crepitus: No Crepitus: No Fluctuance: No Fluctuance: No Friable: No Friable: No Rash: No Rash: No Scarring: No Scarring: No Periwound Skin Moist: Yes Moist: Yes N/A Moisture: Maceration: No Dry/Scaly:  Yes Dry/Scaly: No Maceration: No Periwound Skin Color: Atrophie Blanche: No Atrophie Blanche: No N/A Cyanosis: No Cyanosis: No Ecchymosis: No Ecchymosis: No Erythema: No Erythema: No Hemosiderin Staining: No Hemosiderin Staining: No Mottled: No Mottled: No Pallor: No Pallor: No Rubor: No Rubor: No Temperature: No Abnormality No Abnormality N/A Tenderness on No No N/A Palpation: Wound Preparation: Ulcer Cleansing: Ulcer Cleansing: N/A Rinsed/Irrigated with Rinsed/Irrigated with Saline Saline Topical Anesthetic Topical Anesthetic Applied: Other: lidocaine Applied: Other: lidocaine 4% 4% Treatment Notes Electronic Signature(s) Signed: 12/28/2015 4:45:24 PM By: Elpidio Eric BSN, RN Entered By: Elpidio Eric on 12/28/2015 09:44:03 [name], [name] ([id number]) -------------------------------------------------------------------------------- Multi-Disciplinary Care Plan Details Patient Name: Tyler Milo D. Date of Service: 12/28/2015 8:45 AM Medical Record Number: [id number] Patient Account Number: [id number] Date of Birth/Sex: Nov 29, 1955 (60 y.o. Male) Treating RN: Clover Mealy, RN, BSN, [place] Sink Primary Care Physician: Joen Laura Other Clinician: Referring Physician: Joen Laura Treating Physician/Extender: Altamese [place] in Treatment: 0 Active Inactive Abuse / Safety / Falls / Self Care Management Nursing Diagnoses: Impaired home maintenance Impaired physical mobility Knowledge deficit related to: safety; personal, health (wound), emergency Potential for falls Self care deficit: actual or potential Goals: Patient will remain injury free Date Initiated: 12/28/2015 Goal Status: Active Patient/caregiver will verbalize understanding of skin care regimen Date Initiated: 12/28/2015 Goal Status: Active Patient/caregiver will verbalize/demonstrate measure taken to improve self care Date Initiated: 12/28/2015 Goal Status: Active Patient/caregiver will verbalize/demonstrate  measures taken to improve the patient's personal safety Date Initiated: 12/28/2015 Goal Status: Active Patient/caregiver will verbalize/demonstrate measures taken to prevent injury and/or falls Date Initiated: 12/28/2015 Goal Status: Active Patient/caregiver will verbalize/demonstrate understanding of what to do in case of emergency Date Initiated: 12/28/2015 Goal Status: Active Interventions: Assess fall risk on admission and as needed Assess: immobility, friction, shearing, incontinence upon admission and as needed Assess impairment of mobility on admission and as needed per policy Assess self care needs on admission and as needed Provide education on basic hygiene ZAHI, PLASKETT ([id number]) Provide education on fall prevention Provide education on personal and home safety Provide education on safe transfers Notes: Orientation to the Wound Care Program Nursing Diagnoses: Knowledge deficit related to the wound healing center program Goals: Patient/caregiver will verbalize understanding of the Wound Healing Center Program Date Initiated: 12/28/2015 Goal Status: Active Interventions: Provide education on orientation to the wound center Notes: Venous Leg Ulcer Nursing Diagnoses: Knowledge deficit related to disease process and management Potential for venous Insuffiency (use before diagnosis confirmed) Goals: Non-invasive venous studies are completed as ordered Date Initiated: 12/28/2015 Goal Status: Active Patient/caregiver will verbalize understanding of disease process and disease management Date Initiated: 12/28/2015 Goal Status: Active Verify adequate tissue perfusion prior to therapeutic compression application Date Initiated: 12/28/2015 Goal Status: Active Interventions: Assess peripheral edema status every visit. Provide education on venous insufficiency Notes: Wound/Skin Impairment Nursing Diagnoses: Harter, Maclin D. ([id number]) Impaired tissue integrity Knowledge  deficit related to smoking impact on wound healing Knowledge deficit related to ulceration/compromised skin integrity Goals: Patient/caregiver will verbalize understanding of skin care regimen Date Initiated: 12/28/2015 Goal Status: Active Ulcer/skin breakdown will have a volume reduction of 30% by week 4 Date Initiated: 12/28/2015 Goal Status: Active Ulcer/skin breakdown will have a volume reduction of 50% by week 8 Date Initiated: 12/28/2015 Goal Status: Active Ulcer/skin breakdown will have a volume reduction of 80% by week 12 Date Initiated: 12/28/2015 Goal Status: Active Ulcer/skin breakdown will heal within 14 weeks Date Initiated: 12/28/2015 Goal Status: Active Interventions: Assess patient/caregiver ability to obtain necessary supplies Assess patient/caregiver ability to perform ulcer/skin care regimen upon admission and as needed Assess ulceration(s) every visit Provide education on ulcer and skin care Notes: Electronic Signature(s) Signed: 12/28/2015 4:45:24 PM By: Elpidio Eric BSN, RN Entered By: Elpidio Eric on 12/28/2015 09:43:27 [name], [name] D. ([id number]) -------------------------------------------------------------------------------- Pain Assessment Details Patient Name: Tyler Milo D. Date of Service: 12/28/2015 8:45 AM Medical Record Number: [id number] Patient Account Number: [id number] Date of Birth/Sex: [birth year] (60 y.o. Male) Treating RN: Clover Mealy, RN, BSN, [place] Sink Primary Care Physician: Joen Laura Other Clinician: Referring Physician: Joen Laura Treating Physician/Extender: [name] Weeks in Treatment: 0 Active Problems Location of Pain Severity and Description of Pain Patient Has Paino Yes Site Locations Pain Location: Generalized Pain Rate the pain. Current Pain Level: 4 Character of Pain Describe the Pain: Aching Pain Management and Medication Current Pain Management: How does your pain impact your activities of daily livingo Sleep:  Yes Bathing: Yes Appetite: Yes Relationship With Others: Yes Bladder Continence: Yes Emotions: Yes Bowel Continence: Yes Work: Yes Toileting: Yes Drive: Yes Dressing: Yes Hobbies: Yes Electronic Signature(s) Signed: 12/28/2015 4:45:24 PM By: Elpidio Eric BSN, RN Entered By: Elpidio Eric on 12/28/2015 09:07:48 Sebald, [name] ([id number]) -------------------------------------------------------------------------------- Patient/Caregiver Education Details Patient Name: Tyler Milo D. Date of Service: 12/28/2015 8:45 AM Medical Record Number: [id number] Patient Account Number: [id number] Date of Birth/Gender: 1956-08-22 (60 y.o. Male) Treating RN: Clover Mealy, RN, BSN, [place] Sink Primary Care Physician: Joen Laura Other Clinician: Referring Physician: Joen Laura Treating Physician/Extender: Altamese [place] in Treatment: 0 Education Assessment Education Provided To: Patient Education Topics Provided Basic Hygiene: Methods: Explain/Verbal Responses: State content correctly Safety: Methods: Explain/Verbal Responses: State content correctly Venous: Methods: Explain/Verbal Responses: State content correctly Welcome To The Wound Care Center: Methods: Explain/Verbal Responses: State content correctly Wound/Skin Impairment: Methods: Explain/Verbal Responses: State content correctly Electronic Signature(s) Signed: 12/28/2015 4:45:24 PM By: Elpidio Eric BSN, RN Entered By: Elpidio Eric on 12/28/2015 09:44:51 Holloran, Theador D. ([id number]) -------------------------------------------------------------------------------- Wound Assessment Details Patient Name: Schear, Perl D. Date of Service: 12/28/2015 8:45 AM Medical Record Number: [id number] Patient Account Number: [id number] Date of Birth/Sex: Feb 28, 1956 (60 y.o. Male) Treating RN: Clover Mealy, RN, BSN, [place] Sink Primary Care Physician: Joen Laura Other Clinician: Referring Physician: Joen Laura Treating Physician/Extender: [name] Weeks in Treatment: 0 Wound Status Wound Number: 1 Primary Diabetic Wound/Ulcer of the Lower Etiology: Extremity Wound Location: Left Lower Leg - Anterior Wound Open Wounding Event: Gradually Appeared Status: Date Acquired: 12/21/2014 Comorbid Anemia, Chronic Obstructive Pulmonary Weeks Of Treatment: 0 History: Disease (COPD), Arrhythmia, Clustered Wound: No Congestive Heart Failure, Coronary Artery Disease, Hypertension, Type II Diabetes, Neuropathy Photos Photo Uploaded By: Elpidio Eric on 12/28/2015 16:38:24 Wound Measurements Length: (cm) 15 Width: (cm) 11 Depth: (cm) 0.1 Area: (cm) 129.591 Volume: (cm) 12.959 % Reduction in Area: 0% % Reduction in Volume: 0% Epithelialization: None Tunneling: No Undermining: No Wound Description Classification: Grade 0  Wound Margin: Distinct, outline attached Exudate Amount: Small Exudate Type: Serosanguineous Exudate Color: red, brown Foul Odor After Cleansing: No Wound Bed Granulation Amount: Medium (34-66%) Exposed Structure Granulation Quality: Pink Fascia Exposed: No Coscia, Kendall D. ([id number]) Necrotic Amount: Small (1-33%) Fat Layer Exposed: No Necrotic Quality: Adherent Slough Tendon Exposed: No Muscle Exposed: No Joint Exposed: No Bone Exposed: No Limited to Skin Breakdown Periwound Skin Texture Texture Color No Abnormalities Noted: No No Abnormalities Noted: No Callus: No Atrophie Blanche: No Crepitus: No Cyanosis: No Excoriation: No Ecchymosis: No Fluctuance: No Erythema: No Friable: No Hemosiderin Staining: No Induration: No Mottled: No Localized Edema: Yes Pallor: No Rash: No Rubor: No Scarring: No Temperature / Pain Moisture Temperature: No Abnormality No Abnormalities Noted: No Dry / Scaly: No Maceration: No Moist: Yes Wound Preparation Ulcer Cleansing: Rinsed/Irrigated with Saline Topical Anesthetic Applied: Other: lidocaine 4%, Treatment Notes Wound #1 (Left, Anterior Lower  Leg) 1. Cleansed with: Clean wound with Normal Saline 4. Dressing Applied: Aquacel Ag 5. Secondary Dressing Applied Gauze and Kerlix/Conform 7. Secured with Secretary/administrator) Signed: 12/28/2015 4:45:24 PM By: Elpidio Eric BSN, RN Entered By: Elpidio Eric on 12/28/2015 09:20:51 Malecki, [name] ([id number]) -------------------------------------------------------------------------------- Wound Assessment Details Patient Name: Tyler Milo D. Date of Service: 12/28/2015 8:45 AM Medical Record Number: [id number] Patient Account Number: [id number] Date of Birth/Sex: Nov 17, 1955 (60 y.o. Male) Treating RN: Clover Mealy, RN, BSN, [place] Sink Primary Care Physician: Joen Laura Other Clinician: Referring Physician: Joen Laura Treating Physician/Extender: [name] Weeks in Treatment: 0 Wound Status Wound Number: 2 Primary Diabetic Wound/Ulcer of the Lower Etiology: Extremity Wound Location: Right Foot - Plantar Wound Open Wounding Event: Gradually Appeared Status: Date Acquired: 12/21/2014 Comorbid Anemia, Chronic Obstructive Pulmonary Weeks Of Treatment: 0 History: Disease (COPD), Arrhythmia, Clustered Wound: No Congestive Heart Failure, Coronary Artery Disease, Hypertension, Type II Diabetes, Neuropathy Photos Photo Uploaded By: Elpidio Eric on 12/28/2015 16:38:24 Wound Measurements Length: (cm) 1 Width: (cm) 1 Depth: (cm) 0.3 Area: (cm) 0.785 Volume: (cm) 0.236 % Reduction in Area: 0% % Reduction in Volume: 0% Epithelialization: None Tunneling: No Undermining: No Wound Description Classification: Grade 1 Wound Margin: Distinct, outline attached Exudate Amount: Small Exudate Type: Serosanguineous Exudate Color: red, brown Foul Odor After Cleansing: No Wound Bed Granulation Amount: Medium (34-66%) Exposed Structure Granulation Quality: Pink Fascia Exposed: No Bobb, Ismar D. ([id number]) Necrotic Amount: Small (1-33%) Fat Layer Exposed: No Necrotic Quality:  Adherent Slough Tendon Exposed: No Muscle Exposed: No Joint Exposed: No Bone Exposed: No Limited to Skin Breakdown Periwound Skin Texture Texture Color No Abnormalities Noted: No No Abnormalities Noted: No Callus: Yes Atrophie Blanche: No Crepitus: No Cyanosis: No Excoriation: No Ecchymosis: No Fluctuance: No Erythema: No Friable: No Hemosiderin Staining: No Induration: No Mottled: No Localized Edema: No Pallor: No Rash: No Rubor: No Scarring: No Temperature / Pain Moisture Temperature: No Abnormality No Abnormalities Noted: No Dry / Scaly: Yes Maceration: No Moist: Yes Wound Preparation Ulcer Cleansing: Rinsed/Irrigated with Saline Topical Anesthetic Applied: Other: lidocaine 4%, Electronic Signature(s) Signed: 12/28/2015 4:45:24 PM By: Elpidio Eric BSN, RN Entered By: Elpidio Eric on 12/28/2015 09:23:16 Cardona, [name] ([id number]) -------------------------------------------------------------------------------- Vitals Details Patient Name: Tyler Milo D. Date of Service: 12/28/2015 8:45 AM Medical Record Number: [id number] Patient Account Number: [id number] Date of Birth/Sex: January 21, 1956 (60 y.o. Male) Treating RN: Clover Mealy, RN, BSN, [place] Sink Primary Care Physician: Joen Laura Other Clinician: Referring Physician: Joen Laura Treating Physician/Extender: Altamese [place] in Treatment: 0 Vital Signs Time Taken: 09:09 Temperature (F): 98.6 Height (in): 67 Pulse (  bpm): 65 Source: Stated Respiratory Rate (breaths/min): 18 Weight (lbs): 195 Blood Pressure (mmHg): 145/61 Source: Stated Reference Range: 80 - 120 mg / dl Body Mass Index (BMI): 30.5 Electronic Signature(s) Signed: 12/28/2015 4:45:24 PM By: Elpidio Eric BSN, RN Entered By: Elpidio Eric on 12/28/2015 09:10:41

## 2016-01-01 LAB — WOUND CULTURE

## 2016-01-04 ENCOUNTER — Encounter: Payer: Medicare Other | Admitting: Internal Medicine

## 2016-01-04 DIAGNOSIS — E11621 Type 2 diabetes mellitus with foot ulcer: Secondary | ICD-10-CM | POA: Diagnosis not present

## 2016-01-05 NOTE — Progress Notes (Signed)
[name] ([id number]) Visit Report for 01/04/2016 Arrival Information Details Patient Name: [name], [name]. Date of Service: 01/04/2016 10:00 AM Medical Record Number: [id number] Patient Account Number: [id number] Date of Birth/Sex: 04/06/1956 (60 y.o. Male) Treating RN: Clover Mealy, RN, BSN, [place] Sink Primary Care Physician: Joen Laura Other Clinician: Referring Physician: Joen Laura Treating Physician/Extender: Altamese [place] in Treatment: 1 Visit Information History Since Last Visit Added or deleted any medications: No Patient Arrived: Wheel Chair Any new allergies or adverse reactions: No Arrival Time: 09:36 Had a fall or experienced change in No activities of daily living that may affect Accompanied By: self risk of falls: Transfer Assistance: None Signs or symptoms of abuse/neglect since last No Patient Identification Verified: Yes visito Secondary Verification Process Yes Hospitalized since last visit: No Completed: Has Dressing in Place as Prescribed: Yes Patient Requires Transmission-Based No Pain Present Now: No Precautions: Patient Has Alerts: No Electronic Signature(s) Signed: 01/04/2016 5:12:37 PM By: Elpidio Eric BSN, RN Entered By: Elpidio Eric on 01/04/2016 09:36:53 Luz, [name] ([id number]) -------------------------------------------------------------------------------- Encounter Discharge Information Details Patient Name: Tyler Milo D. Date of Service: 01/04/2016 10:00 AM Medical Record Number: [id number] Patient Account Number: [id number] Date of Birth/Sex: [birth year] (59 y.o. Male) Treating RN: Clover Mealy, RN, BSN, [place] Sink Primary Care Physician: Joen Laura Other Clinician: Referring Physician: Joen Laura Treating Physician/Extender: Altamese [place] in Treatment: 1 Encounter Discharge Information Items Discharge Pain Level: 0 Discharge Condition: Stable Ambulatory Status: Wheelchair Discharge Destination: Nursing Home Transportation:  Other Accompanied By: self Schedule Follow-up Appointment: No Medication Reconciliation completed and provided to Patient/Care No Teale Goodgame: Provided on Clinical Summary of Care: 01/04/2016 Form Type Recipient Paper Patient [facility] Electronic Signature(s) Signed: 01/04/2016 10:44:38 AM By: Francie Massing Entered By: Francie Massing on 01/04/2016 10:44:37 Pring, Suhan D. ([id number]) -------------------------------------------------------------------------------- Lower Extremity Assessment Details Patient Name: [name], [name] Date of Service: 01/04/2016 10:00 AM Medical Record Number: [id number] Patient Account Number: [id number] Date of Birth/Sex: 1956/04/02 (60 y.o. Male) Treating RN: Clover Mealy, RN, BSN, [place] Sink Primary Care Physician: Joen Laura Other Clinician: Referring Physician: Joen Laura Treating Physician/Extender: [name] Weeks in Treatment: 1 Edema Assessment Assessed: [Left: No] [Right: No] E[Left: dema] [Right: :] Calf Left: Right: Point of Measurement: 34 cm From Medial Instep cm cm Ankle Left: Right: Point of Measurement: 8 cm From Medial Instep cm cm Vascular Assessment Pulses: Posterior Tibial Dorsalis Pedis Palpable: [Left:Yes] Extremity colors, hair growth, and conditions: Extremity Color: [Left:Mottled] Hair Growth on Extremity: [Left:No] Temperature of Extremity: [Left:Hot] Capillary Refill: [Left:< 3 seconds] Toe Nail Assessment Left: Right: Thick: Yes Discolored: Yes Deformed: No Improper Length and Hygiene: No Electronic Signature(s) Signed: 01/04/2016 5:12:37 PM By: Elpidio Eric BSN, RN Entered By: Elpidio Eric on 01/04/2016 09:47:00 Georgiades, Future D. ([id number]) -------------------------------------------------------------------------------- Multi Wound Chart Details Patient Name: Tyler Milo D. Date of Service: 01/04/2016 10:00 AM Medical Record Number: [id number] Patient Account Number: [id number] Date of Birth/Sex: 1956-10-24 (60 y.o.  Male) Treating RN: Clover Mealy, RN, BSN, [place] Sink Primary Care Physician: Joen Laura Other Clinician: Referring Physician: Joen Laura Treating Physician/Extender: [name] Weeks in Treatment: 1 Vital Signs Height(in): 67 Pulse(bpm): 68 Weight(lbs): 195 Blood Pressure 125/57 (mmHg): Body Mass Index(BMI): 31 Temperature(F): 98.6 Respiratory Rate 20 (breaths/min): Photos: [1:No Photos] [2:No Photos] [N/A:N/A] Wound Location: [1:Left Lower Leg - Anterior] [2:Left Foot - Plantar] [N/A:N/A] Wounding Event: [1:Gradually Appeared] [2:Gradually Appeared] [N/A:N/A] Primary Etiology: [1:Diabetic Wound/Ulcer of the Lower Extremity] [2:Diabetic Wound/Ulcer of the Lower Extremity] [N/A:N/A] Comorbid History: [1:Anemia, Chronic Obstructive Pulmonary Disease (COPD), Arrhythmia, Congestive Heart  Failure, Coronary Artery Disease, Hypertension, Type II Diabetes, Neuropathy] [2:Anemia, Chronic Obstructive Pulmonary Disease (COPD), Arrhythmia,  Congestive Heart Failure, Coronary Artery Disease, Hypertension, Type II Diabetes, Neuropathy] [N/A:N/A] Date Acquired: [1:12/21/2014] [2:12/21/2014] [N/A:N/A] Weeks of Treatment: [1:1] [2:1] [N/A:N/A] Wound Status: [1:Open] [2:Open] [N/A:N/A] Measurements L x W x D 2x1.5x0.1 [2:1.2x1.5x0.3] [N/A:N/A] (cm) Area (cm) : [1:2.356] [2:1.414] [N/A:N/A] Volume (cm) : [1:0.236] [2:0.424] [N/A:N/A] % Reduction in Area: [1:98.20%] [2:-80.10%] [N/A:N/A] % Reduction in Volume: 98.20% [2:-79.70%] [N/A:N/A] Classification: [1:Grade 0] [2:Grade 1] [N/A:N/A] Exudate Amount: [1:Small] [2:Small] [N/A:N/A] Exudate Type: [1:Serosanguineous] [2:Serosanguineous] [N/A:N/A] Exudate Color: [1:red, brown] [2:red, brown] [N/A:N/A] Wound Margin: [1:Distinct, outline attached] [2:Distinct, outline attached] [N/A:N/A] Granulation Amount: [1:Medium (34-66%)] [2:Medium (34-66%)] [N/A:N/A] Granulation Quality: [1:Pink] [2:Pink] [N/A:N/A] Necrotic Amount: [1:Small (1-33%)] [2:Small  (1-33%)] [N/A:N/A] Exposed Structures: Fascia: No Fascia: No N/A Fat: No Fat: No Tendon: No Tendon: No Muscle: No Muscle: No Joint: No Joint: No Bone: No Bone: No Limited to Skin Limited to Skin Breakdown Breakdown Epithelialization: None None N/A Periwound Skin Texture: Edema: Yes Callus: Yes N/A Excoriation: No Edema: No Induration: No Excoriation: No Callus: No Induration: No Crepitus: No Crepitus: No Fluctuance: No Fluctuance: No Friable: No Friable: No Rash: No Rash: No Scarring: No Scarring: No Periwound Skin Moist: Yes Moist: Yes N/A Moisture: Maceration: No Dry/Scaly: Yes Dry/Scaly: No Maceration: No Periwound Skin Color: Atrophie Blanche: No Atrophie Blanche: No N/A Cyanosis: No Cyanosis: No Ecchymosis: No Ecchymosis: No Erythema: No Erythema: No Hemosiderin Staining: No Hemosiderin Staining: No Mottled: No Mottled: No Pallor: No Pallor: No Rubor: No Rubor: No Temperature: No Abnormality No Abnormality N/A Tenderness on No No N/A Palpation: Wound Preparation: Ulcer Cleansing: Ulcer Cleansing: N/A Rinsed/Irrigated with Rinsed/Irrigated with Saline Saline Topical Anesthetic Topical Anesthetic Applied: Other: lidocaine Applied: Other: lidocaine 4% 4% Treatment Notes Electronic Signature(s) Signed: 01/04/2016 5:12:37 PM By: Elpidio Eric BSN, RN Entered By: Elpidio Eric on 01/04/2016 09:49:52 Piltz, Ashawn Algis Downs ([id number]) -------------------------------------------------------------------------------- Multi-Disciplinary Care Plan Details Patient Name: Tyler Milo D. Date of Service: 01/04/2016 10:00 AM Medical Record Number: [id number] Patient Account Number: [id number] Date of Birth/Sex: 02-12-1956 (60 y.o. Male) Treating RN: Clover Mealy, RN, BSN, [place] Sink Primary Care Physician: Joen Laura Other Clinician: Referring Physician: Joen Laura Treating Physician/Extender: Altamese [place] in Treatment: 1 Active Inactive Abuse / Safety /  Falls / Self Care Management Nursing Diagnoses: Impaired home maintenance Impaired physical mobility Knowledge deficit related to: safety; personal, health (wound), emergency Potential for falls Self care deficit: actual or potential Goals: Patient will remain injury free Date Initiated: 12/28/2015 Goal Status: Active Patient/caregiver will verbalize understanding of skin care regimen Date Initiated: 12/28/2015 Goal Status: Active Patient/caregiver will verbalize/demonstrate measure taken to improve self care Date Initiated: 12/28/2015 Goal Status: Active Patient/caregiver will verbalize/demonstrate measures taken to improve the patient's personal safety Date Initiated: 12/28/2015 Goal Status: Active Patient/caregiver will verbalize/demonstrate measures taken to prevent injury and/or falls Date Initiated: 12/28/2015 Goal Status: Active Patient/caregiver will verbalize/demonstrate understanding of what to do in case of emergency Date Initiated: 12/28/2015 Goal Status: Active Interventions: Assess fall risk on admission and as needed Assess: immobility, friction, shearing, incontinence upon admission and as needed Assess impairment of mobility on admission and as needed per policy Assess self care needs on admission and as needed Provide education on basic hygiene Stary, AFFAN CALLOW. ([id number]) Provide education on fall prevention Provide education on personal and home safety Provide education on safe transfers Treatment Activities: Education provided on Basic Hygiene : 12/28/2015 Notes: Orientation to the Wound Care Program Nursing Diagnoses: Knowledge  deficit related to the wound healing center program Goals: Patient/caregiver will verbalize understanding of the Wound Healing Center Program Date Initiated: 12/28/2015 Goal Status: Active Interventions: Provide education on orientation to the wound center Notes: Venous Leg Ulcer Nursing Diagnoses: Knowledge deficit related to  disease process and management Potential for venous Insuffiency (use before diagnosis confirmed) Goals: Non-invasive venous studies are completed as ordered Date Initiated: 12/28/2015 Goal Status: Active Patient/caregiver will verbalize understanding of disease process and disease management Date Initiated: 12/28/2015 Goal Status: Active Verify adequate tissue perfusion prior to therapeutic compression application Date Initiated: 12/28/2015 Goal Status: Active Interventions: Assess peripheral edema status every visit. Provide education on venous insufficiency Notes: Swiss, Onofre D. ([id number]) Wound/Skin Impairment Nursing Diagnoses: Impaired tissue integrity Knowledge deficit related to smoking impact on wound healing Knowledge deficit related to ulceration/compromised skin integrity Goals: Patient/caregiver will verbalize understanding of skin care regimen Date Initiated: 12/28/2015 Goal Status: Active Ulcer/skin breakdown will have a volume reduction of 30% by week 4 Date Initiated: 12/28/2015 Goal Status: Active Ulcer/skin breakdown will have a volume reduction of 50% by week 8 Date Initiated: 12/28/2015 Goal Status: Active Ulcer/skin breakdown will have a volume reduction of 80% by week 12 Date Initiated: 12/28/2015 Goal Status: Active Ulcer/skin breakdown will heal within 14 weeks Date Initiated: 12/28/2015 Goal Status: Active Interventions: Assess patient/caregiver ability to obtain necessary supplies Assess patient/caregiver ability to perform ulcer/skin care regimen upon admission and as needed Assess ulceration(s) every visit Provide education on ulcer and skin care Notes: Electronic Signature(s) Signed: 01/04/2016 5:12:37 PM By: Elpidio Eric BSN, RN Entered By: Elpidio Eric on 01/04/2016 09:49:19 Espinoza, Neven D. ([id number]) -------------------------------------------------------------------------------- Pain Assessment Details Patient Name: Tyler Milo D. Date of  Service: 01/04/2016 10:00 AM Medical Record Number: [id number] Patient Account Number: [id number] Date of Birth/Sex: [birth year] (61 y.o. Male) Treating RN: Clover Mealy, RN, BSN, [place] Sink Primary Care Physician: Joen Laura Other Clinician: Referring Physician: Joen Laura Treating Physician/Extender: [name] Weeks in Treatment: 1 Active Problems Location of Pain Severity and Description of Pain Patient Has Paino No Site Locations Pain Management and Medication Current Pain Management: Electronic Signature(s) Signed: 01/04/2016 5:12:37 PM By: Elpidio Eric BSN, RN Entered By: Elpidio Eric on 01/04/2016 09:38:24 Helman, [name] ([id number]) -------------------------------------------------------------------------------- Patient/Caregiver Education Details Patient Name: Tyler Milo D. Date of Service: 01/04/2016 10:00 AM Medical Record Number: [id number] Patient Account Number: [id number] Date of Birth/Gender: August 21, 1956 (60 y.o. Male) Treating RN: Clover Mealy, RN, BSN, [place] Sink Primary Care Physician: Joen Laura Other Clinician: Referring Physician: Joen Laura Treating Physician/Extender: Altamese [place] in Treatment: 1 Education Assessment Education Provided To: Patient Education Topics Provided Basic Hygiene: Methods: Explain/Verbal Responses: State content correctly Safety: Methods: Explain/Verbal Responses: State content correctly Venous: Methods: Explain/Verbal Responses: State content correctly Welcome To The Wound Care Center: Methods: Explain/Verbal Responses: State content correctly Wound/Skin Impairment: Methods: Explain/Verbal Responses: State content correctly Electronic Signature(s) Signed: 01/04/2016 5:12:37 PM By: Elpidio Eric BSN, RN Entered By: Elpidio Eric on 01/04/2016 10:52:27 Corlett, Jakiah D. ([id number]) -------------------------------------------------------------------------------- Wound Assessment Details Patient Name: Miguez, Navdeep D. Date of  Service: 01/04/2016 10:00 AM Medical Record Number: [id number] Patient Account Number: [id number] Date of Birth/Sex: 11-16-1955 (60 y.o. Male) Treating RN: Clover Mealy, RN, BSN, [place] Sink Primary Care Physician: Joen Laura Other Clinician: Referring Physician: Joen Laura Treating Physician/Extender: [name] Weeks in Treatment: 1 Wound Status Wound Number: 1 Primary Diabetic Wound/Ulcer of the Lower Etiology: Extremity Wound Location: Left Lower Leg - Anterior Wound Open Wounding Event: Gradually Appeared Status: Date Acquired: 12/21/2014 Comorbid Anemia, Chronic  Obstructive Pulmonary Weeks Of Treatment: 1 History: Disease (COPD), Arrhythmia, Clustered Wound: No Congestive Heart Failure, Coronary Artery Disease, Hypertension, Type II Diabetes, Neuropathy Photos Photo Uploaded By: Elpidio Eric on 01/04/2016 17:17:45 Wound Measurements Length: (cm) 2 Width: (cm) 1.5 Depth: (cm) 0.1 Area: (cm) 2.356 Volume: (cm) 0.236 % Reduction in Area: 98.2% % Reduction in Volume: 98.2% Epithelialization: None Tunneling: No Undermining: No Wound Description Classification: Grade 0 Wound Margin: Distinct, outline attached Exudate Amount: Small Exudate Type: Serosanguineous Exudate Color: red, brown Foul Odor After Cleansing: No Wound Bed Granulation Amount: Medium (34-66%) Exposed Structure Granulation Quality: Pink Fascia Exposed: No Pawlicki, Jacquelyn D. ([id number]) Necrotic Amount: Small (1-33%) Fat Layer Exposed: No Necrotic Quality: Adherent Slough Tendon Exposed: No Muscle Exposed: No Joint Exposed: No Bone Exposed: No Limited to Skin Breakdown Periwound Skin Texture Texture Color No Abnormalities Noted: No No Abnormalities Noted: No Callus: No Atrophie Blanche: No Crepitus: No Cyanosis: No Excoriation: No Ecchymosis: No Fluctuance: No Erythema: No Friable: No Hemosiderin Staining: No Induration: No Mottled: No Localized Edema: Yes Pallor: No Rash: No Rubor:  No Scarring: No Temperature / Pain Moisture Temperature: No Abnormality No Abnormalities Noted: No Dry / Scaly: No Maceration: No Moist: Yes Wound Preparation Ulcer Cleansing: Rinsed/Irrigated with Saline Topical Anesthetic Applied: Other: lidocaine 4%, Treatment Notes Wound #1 (Left, Anterior Lower Leg) 1. Cleansed with: Clean wound with Normal Saline 4. Dressing Applied: Prisma Ag 5. Secondary Dressing Applied Gauze and Kerlix/Conform 6. Footwear/Offloading device applied Felt/Foam Other footwear/offloading device applied (specify in notes) 7. Secured with Tape Notes Darco with Peg Assist Electronic Signature(s) LIANDRO, THELIN ([id number]) Signed: 01/04/2016 5:12:37 PM By: Elpidio Eric BSN, RN Entered By: Elpidio Eric on 01/04/2016 09:48:38 Huntley, Tanor DMarland Kitchen ([id number]) -------------------------------------------------------------------------------- Wound Assessment Details Patient Name: Popoff, Malek D. Date of Service: 01/04/2016 10:00 AM Medical Record Number: [id number] Patient Account Number: [id number] Date of Birth/Sex: 28-Dec-1955 (60 y.o. Male) Treating RN: Clover Mealy, RN, BSN, [place] Sink Primary Care Physician: Joen Laura Other Clinician: Referring Physician: Joen Laura Treating Physician/Extender: [name] Weeks in Treatment: 1 Wound Status Wound Number: 2 Primary Diabetic Wound/Ulcer of the Lower Etiology: Extremity Wound Location: Left Foot - Plantar Wound Open Wounding Event: Gradually Appeared Status: Date Acquired: 12/21/2014 Comorbid Anemia, Chronic Obstructive Pulmonary Weeks Of Treatment: 1 History: Disease (COPD), Arrhythmia, Clustered Wound: No Congestive Heart Failure, Coronary Artery Disease, Hypertension, Type II Diabetes, Neuropathy Photos Photo Uploaded By: Elpidio Eric on 01/04/2016 17:17:45 Wound Measurements Length: (cm) 1.2 Width: (cm) 1.5 Depth: (cm) 0.3 Area: (cm) 1.414 Volume: (cm) 0.424 % Reduction in Area: -80.1% %  Reduction in Volume: -79.7% Epithelialization: None Tunneling: No Undermining: No Wound Description Classification: Grade 1 Wound Margin: Distinct, outline attached Exudate Amount: Small Exudate Type: Serosanguineous Exudate Color: red, brown Foul Odor After Cleansing: No Wound Bed Granulation Amount: Medium (34-66%) Exposed Structure Granulation Quality: Pink Fascia Exposed: No Eich, Deronte D. ([id number]) Necrotic Amount: Small (1-33%) Fat Layer Exposed: No Necrotic Quality: Adherent Slough Tendon Exposed: No Muscle Exposed: No Joint Exposed: No Bone Exposed: No Limited to Skin Breakdown Periwound Skin Texture Texture Color No Abnormalities Noted: No No Abnormalities Noted: No Callus: Yes Atrophie Blanche: No Crepitus: No Cyanosis: No Excoriation: No Ecchymosis: No Fluctuance: No Erythema: No Friable: No Hemosiderin Staining: No Induration: No Mottled: No Localized Edema: No Pallor: No Rash: No Rubor: No Scarring: No Temperature / Pain Moisture Temperature: No Abnormality No Abnormalities Noted: No Dry / Scaly: Yes Maceration: No Moist: Yes Wound Preparation Ulcer Cleansing: Rinsed/Irrigated with Saline Topical Anesthetic Applied:  Other: lidocaine 4%, Treatment Notes Wound #2 (Left, Plantar Foot) 1. Cleansed with: Clean wound with Normal Saline 4. Dressing Applied: Prisma Ag 5. Secondary Dressing Applied Gauze and Kerlix/Conform 6. Footwear/Offloading device applied Felt/Foam Other footwear/offloading device applied (specify in notes) 7. Secured with Tape Notes Darco with Peg Assist Electronic Signature(s) MARISA, HUFSTETLER ([id number]) Signed: 01/04/2016 5:12:37 PM By: Elpidio Eric BSN, RN Entered By: Elpidio Eric on 01/04/2016 09:49:08 Sterling, [name] ([id number]) -------------------------------------------------------------------------------- Vitals Details Patient Name: Tyler Milo D. Date of Service: 01/04/2016 10:00 AM Medical Record  Number: [id number] Patient Account Number: [id number] Date of Birth/Sex: 03-01-1956 (60 y.o. Male) Treating RN: Afful, RN, BSN, [place] Sink Primary Care Physician: Joen Laura Other Clinician: Referring Physician: Joen Laura Treating Physician/Extender: Altamese [place] in Treatment: 1 Vital Signs Time Taken: 09:38 Temperature (F): 98.6 Height (in): 67 Pulse (bpm): 68 Weight (lbs): 195 Respiratory Rate (breaths/min): 20 Body Mass Index (BMI): 30.5 Blood Pressure (mmHg): 125/57 Reference Range: 80 - 120 mg / dl Electronic Signature(s) Signed: 01/04/2016 5:12:37 PM By: Elpidio Eric BSN, RN Entered By: Elpidio Eric on 01/04/2016 09:39:58

## 2016-01-05 NOTE — Progress Notes (Signed)
AHAD, COLARUSSO ([id number]) Visit Report for 01/04/2016 Chief Complaint Document Details Patient Name: [name], [name]. Date of Service: 01/04/2016 10:00 AM Medical Record Patient Account Number: [id number] Number: Treating RN: Clover Mealy, RN, BSN, Rita 11/08/1956 [phone] y.o. Other Clinician: Date of Birth/Sex: Male) Treating ROBSON, Charvez Primary Care Physician: Joen Laura Physician/Extender: G Referring Physician: Tedra Senegal in Treatment: 1 Information Obtained from: Patient Chief Complaint Patient is here for review of a wound on his left first metatarsal head that is been there for the last 6 months Electronic Signature(s) Signed: 01/05/2016 8:05:15 AM By: Baltazar Najjar MD Entered By: Baltazar Najjar on 01/04/2016 10:59:03 [name], [name]. ([id number]) -------------------------------------------------------------------------------- Debridement Details Patient Name: Tyler Milo Frank. Date of Service: 01/04/2016 10:00 AM Medical Record Patient Account Number: [id number] Number: Treating RN: Clover Mealy, RN, BSN, Rita 1956-01-07 [phone] y.o. Other Clinician: Date of Birth/Sex: Male) Treating ROBSON, Nicasio Primary Care Physician: Joen Laura Physician/Extender: G Referring Physician: Tedra Senegal in Treatment: 1 Debridement Performed for Wound #2 Left,Plantar Foot Assessment: Performed By: Physician [name], MD Debridement: Debridement Pre-procedure Yes Verification/Time Out Taken: Start Time: 10:05 Pain Control: Lidocaine 4% Topical Solution Level: Skin/Subcutaneous Tissue Total Area Debrided (L x 1.2 (cm) x 1.5 (cm) = 1.8 (cm) W): Tissue and other Non-Viable, Callus, Fibrin/Slough, Subcutaneous material debrided: Instrument: Blade Bleeding: Minimum Hemostasis Achieved: Pressure End Time: 10:10 Procedural Pain: 0 Post Procedural Pain: 0 Response to Treatment: Procedure was tolerated well Post Debridement Measurements of Total  Wound Length: (cm) 1.2 Width: (cm) 1.5 Depth: (cm) 0.3 Volume: (cm) 0.424 Post Procedure Diagnosis Same as Pre-procedure Electronic Signature(s) Signed: 01/04/2016 5:12:37 PM By: Elpidio Eric BSN, RN Signed: 01/05/2016 8:05:15 AM By: Baltazar Najjar MD Entered By: Baltazar Najjar on 01/04/2016 10:58:52 [name], [name]. ([id number]) -------------------------------------------------------------------------------- HPI Details Patient Name: Tyler Milo Frank. Date of Service: 01/04/2016 10:00 AM Medical Record Patient Account Number: [id number] Number: Treating RN: Clover Mealy, RN, BSN, Rita [birth year] [phone] y.o. Other Clinician: Date of Birth/Sex: Male) Treating ROBSON, Juanjesus Primary Care Physician: Joen Laura Physician/Extender: G Referring Physician: Tedra Senegal in Treatment: 1 History of Present Illness HPI Description: 12/28/15. This patient is a type II diabetic on insulin. He had vascular interventions 2 weeks ago on 1/17. This included an angioplasty of the left superficial femoral and popliteal arteries as well as a percutaneous transluminal angioplasty of the left posterior tibial artery. Nevertheless he is here for a plantar wound that the patient says is been there for the last 6 months. He initially told us that he is applying Neosporin to this however he tells me also that he has been in a nursing home for the last year. I found that to be a somewhat on usual combination of fax. In any case the patient is a diabetic with diabetic PAD. 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 His ABI on the left side today was 0.62 Electronic Signature(s) Signed: 01/05/2016 8:05:15 AM By: Baltazar Najjar MD Entered By: Baltazar Najjar on 01/04/2016 10:59:31 [name], [name] ([id number]) -------------------------------------------------------------------------------- Physical Exam Details Patient Name: [name], [name]. Date of Service: 01/04/2016  10:00 AM Medical Record Patient Account Number: [id number] Number: Treating RN: Clover Mealy, RN, BSN, Rita 21-Sep-1956 [phone] y.o. Other Clinician: Date of Birth/Sex: Male) Treating ROBSON, Naser Primary Care Physician: Joen Laura Physician/Extender: G Referring Physician: Tedra Senegal in Treatment: 1 Constitutional Sitting or standing Blood Pressure is within target range for patient.. Pulse regular and within target  range for patient.Marland Kitchen Respirations regular, non-labored and within target range.. Cardiovascular Pedal pulses absent bilaterally.. Notes Wound exam; the area in question is still over his left first metatarsal head. This underwent an extensive debridement to remove circumferential callous nonviable subcutaneous tissue. There is no erythema around the wound. The surface of the wound also does not appear to be all that healthy with a fibrinous surface slough. I had difficulty getting through/getting to the bottom of this Electronic Signature(s) Signed: 01/05/2016 8:05:15 AM By: Baltazar Najjar MD Entered By: Baltazar Najjar on 01/04/2016 11:01:55 [name], [name] ([id number]) -------------------------------------------------------------------------------- Physician Orders Details Patient Name: Tyler Milo Frank. Date of Service: 01/04/2016 10:00 AM Medical Record Patient Account Number: [id number] Number: Treating RN: Clover Mealy, RN, BSN, Rita 01/19/1956 [phone] y.o. Other Clinician: Date of Birth/Sex: Male) Treating ROBSON, Isak Primary Care Physician: Joen Laura Physician/Extender: G Referring Physician: Tedra Senegal in Treatment: 1 Verbal / Phone Orders: Yes Clinician: Afful, RN, BSN, Rita Read Back and Verified: Yes Diagnosis Coding Wound Cleansing Wound #1 Left,Anterior Lower Leg o Clean wound with Normal Saline. Wound #2 Left,Plantar Foot o Clean wound with Normal Saline. Anesthetic Wound #1 Left,Anterior Lower Leg o Topical  Lidocaine 4% cream applied to wound bed prior to debridement Wound #2 Left,Plantar Foot o Topical Lidocaine 4% cream applied to wound bed prior to debridement Primary Wound Dressing Wound #1 Left,Anterior Lower Leg o Prisma Ag Wound #2 Left,Plantar Foot o Prisma Ag Secondary Dressing Wound #1 Left,Anterior Lower Leg o Gauze and Kerlix/Conform Wound #2 Left,Plantar Foot o Gauze and Kerlix/Conform o Foam Dressing Change Frequency Wound #1 Left,Anterior Lower Leg o Change dressing every other day. Wound #2 Left,Plantar Foot [name], [name]. ([id number]) o Change dressing every other day. Follow-up Appointments Wound #1 Left,Anterior Lower Leg o Return Appointment in 1 week. Wound #2 Left,Plantar Foot o Return Appointment in 1 week. Off-Loading Wound #2 Left,Plantar Foot o Open toe surgical shoe with peg assist. Additional Orders / Instructions Wound #1 Left,Anterior Lower Leg o Increase protein intake. o Activity as tolerated Wound #2 Left,Plantar Foot o Increase protein intake. o Activity as tolerated Medications-please add to medication list. Wound #1 Left,Anterior Lower Leg o P.O. Antibiotics - Doxycycline  BID x 7days Wound #2 Left,Plantar Foot o P.O. Antibiotics - Doxycycline  BID x 7days Radiology o X-ray, foot - AP lateral of left foot with focus on the plantar. Please fax results to [facility] at [phone]. Nursing Home to obtain xray. Electronic Signature(s) Signed: 01/04/2016 10:44:34 AM By: Elpidio Eric BSN, RN Signed: 01/05/2016 8:05:15 AM By: Baltazar Najjar MD Previous Signature: 01/04/2016 10:42:54 AM Version By: Elpidio Eric BSN, RN Entered By: Elpidio Eric on 01/04/2016 10:44:34 [name], [name] ([id number]) -------------------------------------------------------------------------------- Prescription 01/04/2016 Patient Name: Flora Lipps Physician: [name] MD Date of Birth: 01-May-1956 NPI#: [id number] Sex:  M DEA#: [id number] Phone #: [phone] License #: [id number] Patient Address: [facility] [address] [place], [place] [address] [facility] [address] [place], [place] [address] [phone] Allergies no known allergies Physician's Orders P.O. Antibiotics - Doxycycline  BID x 7days Signature(s): Date(s): SHAHEED, SCHMUCK ([id number]) Prescription 01/04/2016 Patient Name: Flora Lipps Physician: [name] MD Date of Birth: 01-18-1956 NPI#: [id number] Sex: Jenetta Downer: [id number] Phone #: [phone] License #: [id number] Patient Address: [facility] [address] [place], [place] [address] [facility] [address] [place], [place] [address] [phone] Allergies no known allergies  Physician's Orders o X-ray, foot - AP lateral of left foot with focus on the plantar. Please fax results to [facility] at [phone]. Nursing Home to obtain xray. Signature(s): Date(s): Psychologist, prison and probation services) Signed: 01/05/2016 8:05:15 AM By: Baltazar Najjar MD Entered By: Baltazar Najjar on 01/04/2016 11:06:17 [name], [name]. ([id number]) --------------------------------------------------------------------------------  Problem List Details Patient Name: Tyler Milo Frank. Date of Service: 01/04/2016 10:00 AM Medical Record Patient Account Number: [id number] Number: Treating RN: Clover Mealy, RN, BSN, Rita [birth year] [phone] y.o. 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 ([id number]) -------------------------------------------------------------------------------- Progress Note Details Patient Name: Tyler Milo Frank. Date of Service: 01/04/2016 10:00 AM Medical Record Patient Account Number: [id number] Number: Treating RN: Clover Mealy, RN, BSN, Rita 12-22-1955 [phone] y.o. Other Clinician: Date of Birth/Sex: Male) Treating ROBSON, Acel Primary Care Physician: Joen Laura Physician/Extender: G Referring Physician: Tedra Senegal in Treatment: 1 Subjective Chief Complaint Information obtained from Patient Patient is here for review of a wound on his left first metatarsal head that is been there for the last 6 months History of Present Illness (HPI) 12/28/15. This patient is a type II diabetic on insulin. He had vascular interventions 2 weeks ago on 1/17. This included an angioplasty of the left superficial femoral and popliteal arteries as well as a percutaneous transluminal angioplasty of the left posterior tibial artery. Nevertheless he is here for a plantar wound that the patient says is been there for the last 6 months. He initially told us that he is applying Neosporin to this however he tells me also that he has been in a nursing home for the last year. I found that to be a somewhat on usual combination of fax. In any case the patient is a diabetic with diabetic PAD. 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 His ABI on the left side today was 0.62 Objective Constitutional Sitting or standing Blood Pressure is within target range for patient.. Pulse regular and within target range for patient.Marland Kitchen Respirations regular, non-labored and within  target range.. Vitals Time Taken: 9:38 AM, Height: 67 in, Weight: 195 lbs, BMI: 30.5, Temperature: 98.6 F, Pulse: 68 bpm, Respiratory Rate: 20 breaths/min, Blood Pressure: 125/57 mmHg. Cardiovascular Pedal pulses absent bilaterally.. General Notes: Wound exam; the area in question is still over his left first metatarsal head. This underwent an extensive debridement to remove circumferential callous nonviable subcutaneous tissue. There is no erythema around the wound. The surface of the wound also does not appear to be all that healthy with a fibrinous surface slough. I had difficulty getting through/getting to the bottom of this [place], Nikolis Frank. ([id number]) Integumentary (Hair, Skin) Wound #1 status is Open. Original cause of wound was Gradually Appeared. The wound is located on the Left,Anterior Lower Leg. The wound measures 2cm length x 1.5cm width x 0.1cm depth; 2.356cm^2 area and 0.236cm^3 volume. The wound is limited to skin breakdown. There is no tunneling or undermining noted. There is a small amount  of serosanguineous drainage noted. The wound margin is distinct with the outline attached to the wound base. There is medium (34-66%) pink granulation within the wound bed. There is a small (1-33%) amount of necrotic tissue within the wound bed including Adherent Slough. The periwound skin appearance exhibited: Localized Edema, Moist. The periwound skin appearance did not exhibit: Callus, Crepitus, Excoriation, Fluctuance, Friable, Induration, Rash, Scarring, Dry/Scaly, Maceration, Atrophie Blanche, Cyanosis, Ecchymosis, Hemosiderin Staining, Mottled, Pallor, Rubor, Erythema. Periwound temperature was noted as No Abnormality. Wound #2 status is Open. Original cause of wound was Gradually Appeared. The wound is located on the Left,Plantar Foot. The wound measures 1.2cm length x 1.5cm width x 0.3cm depth; 1.414cm^2 area and 0.424cm^3 volume. The wound is limited to skin breakdown. There is  no tunneling or undermining noted. There is a small amount of serosanguineous drainage noted. The wound margin is distinct with the outline attached to the wound base. There is medium (34-66%) pink granulation within the wound bed. There is a small (1-33%) amount of necrotic tissue within the wound bed including Adherent Slough. The periwound skin appearance exhibited: Callus, Dry/Scaly, Moist. The periwound skin appearance did not exhibit: Crepitus, Excoriation, Fluctuance, Friable, Induration, Localized Edema, Rash, Scarring, Maceration, Atrophie Blanche, Cyanosis, Ecchymosis, Hemosiderin Staining, Mottled, Pallor, Rubor, Erythema. Periwound temperature was noted as No Abnormality. Assessment Active Problems ICD-10 L97.522 - Non-pressure chronic ulcer of other part of left foot with fat layer exposed E11.621 - Type 2 diabetes mellitus with foot ulcer E11.51 - Type 2 diabetes mellitus with diabetic peripheral angiopathy without gangrene Procedures Wound #2 Wound #2 is a Diabetic Wound/Ulcer of the Lower Extremity located on the Left,Plantar Foot . There was a Skin/Subcutaneous Tissue Debridement ([id number]) debridement with total area of 1.8 sq cm performed by [name], MD. with the following instrument(s): Blade to remove Non-Viable tissue/material including Fibrin/Slough, Callus, and Subcutaneous after achieving pain control using Lidocaine 4% Topical Solution. A time out was conducted prior to the start of the procedure. A Minimum amount of bleeding was controlled with Pressure. The procedure was tolerated well with a pain level of 0 [place], Quatavious Frank. ([id number]) throughout and a pain level of 0 following the procedure. Post Debridement Measurements: 1.2cm length x 1.5cm width x 0.3cm depth; 0.424cm^3 volume. Post procedure Diagnosis Wound #2: Same as Pre-Procedure Plan Wound Cleansing: Wound #1 Left,Anterior Lower Leg: Clean wound with Normal Saline. Wound #2 Left,Plantar  Foot: Clean wound with Normal Saline. Anesthetic: Wound #1 Left,Anterior Lower Leg: Topical Lidocaine 4% cream applied to wound bed prior to debridement Wound #2 Left,Plantar Foot: Topical Lidocaine 4% cream applied to wound bed prior to debridement Primary Wound Dressing: Wound #1 Left,Anterior Lower Leg: Prisma Ag Wound #2 Left,Plantar Foot: Prisma Ag Secondary Dressing: Wound #1 Left,Anterior Lower Leg: Gauze and Kerlix/Conform Wound #2 Left,Plantar Foot: Gauze and Kerlix/Conform Foam Dressing Change Frequency: Wound #1 Left,Anterior Lower Leg: Change dressing every other day. Wound #2 Left,Plantar Foot: Change dressing every other day. Follow-up Appointments: Wound #1 Left,Anterior Lower Leg: Return Appointment in 1 week. Wound #2 Left,Plantar Foot: Return Appointment in 1 week. Off-Loading: Wound #2 Left,Plantar Foot: Open toe surgical shoe with peg assist. Additional Orders / Instructions: Wound #1 Left,Anterior Lower Leg: Increase protein intake. Activity as tolerated Wound #2 Left,Plantar Foot: Bacus, Caidon Frank. ([id number]) Increase protein intake. Activity as tolerated Medications-please add to medication list.: Wound #1 Left,Anterior Lower Leg: P.O. Antibiotics - Doxycycline  BID x 7days Wound #2 Left,Plantar Foot: P.O. Antibiotics - Doxycycline  BID x 7days Radiology  ordered were: X-ray, foot - AP lateral of left foot with focus on the plantar. Please fax results to [facility] at [phone]. Nursing Home to obtain xray. #1 we changed him to Prisma from the Aquacel Ag to both the plantar wound over his first metatarsal head and the superficial wound on the left lower ankle. #2 culture from last week grew methicillin resistant staph aureus. We put him on doxycycline. #3 the x-ray of the foot with a we ordered at his nursing home facility was apparently not done we have reordered this stayed on all week #4 the patient is post arterial interventions even  before he came to this clinic, nevertheless I wonder how much blood flow he is getting down to the foot and specifically the wound on his first metatarsal head. #5 Peg assist shoe to offload the area on the metatarsal head. The patient is not a candidate for a total contact cast secondary to the amputation on the right, in my experience most people with prosthetics legs simply cannot handle with total contact cast in terms of walking. #480 orders for wound care were not done at the nursing home either in his dressing Electronic Signature(s) Signed: 01/05/2016 8:05:15 AM By: Baltazar Najjar MD Entered By: Baltazar Najjar on 01/04/2016 11:05:41 Em[place], Jahmad Frank. ([id number]) -------------------------------------------------------------------------------- SuperBill Details Patient Name: Tyler Milo Frank. Date of Service: 01/04/2016 Medical Record Patient Account Number: [id number] Number: Treating RN: Clover Mealy, RN, BSN, Rita [birth year] [phone] y.o. Other Clinician: Date of Birth/Sex: Male) Treating ROBSON, Norwin Primary Care Physician: Joen Laura Physician/Extender: G Referring Physician: Tedra Senegal in Treatment: 1 Diagnosis Coding ICD-10 Codes Code Description [phone] Non-pressure chronic ulcer of other part of left foot with fat layer exposed E11.621 Type 2 diabetes mellitus with foot ulcer E11.51 Type 2 diabetes mellitus with diabetic peripheral angiopathy without gangrene Facility Procedures CPT4 Code: [id number] Description: 11042 - DEB SUBQ TISSUE 20 SQ CM/< ICD-10 Description Diagnosis E11.621 Type 2 diabetes mellitus with foot ulcer Modifier: Quantity: 1 Physician Procedures CPT4 Code: [id number] Description: 11042 - WC PHYS SUBQ TISS 20 SQ CM ICD-10 Description Diagnosis E11.621 Type 2 diabetes mellitus with foot ulcer Modifier: Quantity: 1 Electronic Signature(s) Signed: 01/05/2016 8:05:15 AM By: Baltazar Najjar MD Entered By: Baltazar Najjar on 01/04/2016 11:06:06

## 2016-01-11 ENCOUNTER — Encounter: Payer: Medicare Other | Admitting: Internal Medicine

## 2016-01-11 DIAGNOSIS — E11621 Type 2 diabetes mellitus with foot ulcer: Secondary | ICD-10-CM | POA: Diagnosis not present

## 2016-01-12 NOTE — Progress Notes (Signed)
RICKE, KIMOTO ([id number]) Visit Report for 01/11/2016 Chief Complaint Document Details Patient Name: [name], [name]. Date of Service: 01/11/2016 10:00 AM Medical Record Patient Account Number: [id number] Number: Treating RN: Clover Mealy, RN, BSN, Rita 10/25/1956 [phone] y.o. Other Clinician: Date of Birth/Sex: Male) Treating ROBSON, Gareld Primary Care Physician: Joen Laura Physician/Extender: G Referring Physician: Tedra Senegal in Treatment: 2 Information Obtained from: Patient Chief Complaint Patient is here for review of a wound on his left first metatarsal head that is been there for the last 6 months Electronic Signature(s) Signed: 01/12/2016 8:09:37 AM By: Baltazar Najjar MD Entered By: Baltazar Najjar on 01/11/2016 10:23:57 Golliday, Toluwani D. ([id number]) -------------------------------------------------------------------------------- Debridement Details Patient Name: Tyler Milo D. Date of Service: 01/11/2016 10:00 AM Medical Record Patient Account Number: [id number] Number: Treating RN: Clover Mealy, RN, BSN, Rita 12-18-1955 [phone] y.o. Other Clinician: Date of Birth/Sex: Male) Treating ROBSON, Raider Primary Care Physician: Joen Laura Physician/Extender: G Referring Physician: Tedra Senegal in Treatment: 2 Debridement Performed for Wound #2 Left,Plantar Foot Assessment: Performed By: Physician [name], MD Debridement: Debridement Pre-procedure Yes Verification/Time Out Taken: Start Time: 10:10 Pain Control: Lidocaine 4% Topical Solution Level: Skin/Subcutaneous Tissue Total Area Debrided (L x 1.5 (cm) x 1.2 (cm) = 1.8 (cm) W): Tissue and other Non-Viable, Callus, Exudate, Fibrin/Slough, Subcutaneous material debrided: Instrument: Curette Bleeding: Minimum Hemostasis Achieved: Pressure End Time: 10:15 Procedural Pain: 0 Post Procedural Pain: 0 Response to Treatment: Procedure was tolerated well Post Debridement Measurements of  Total Wound Length: (cm) 1.5 Width: (cm) 1.2 Depth: (cm) 0.2 Volume: (cm) 0.283 Post Procedure Diagnosis Same as Pre-procedure Electronic Signature(s) Signed: 01/11/2016 4:37:25 PM By: Elpidio Eric BSN, RN Signed: 01/12/2016 8:09:37 AM By: Baltazar Najjar MD Entered By: Baltazar Najjar on 01/11/2016 10:23:46 Morejon, Castin D. ([id number]) -------------------------------------------------------------------------------- HPI Details Patient Name: Tyler Milo D. Date of Service: 01/11/2016 10:00 AM Medical Record Patient Account Number: [id number] Number: Treating RN: Clover Mealy, RN, BSN, Rita [birth year] [phone] y.o. Other Clinician: Date of Birth/Sex: Male) Treating ROBSON, Lamin Primary Care Physician: Joen Laura Physician/Extender: G Referring Physician: Tedra Sen[place] in Treatment: 2 History of Present Illness HPI Description: 12/28/15. This patient is a type II diabetic on insulin. He had vascular interventions 2 weeks ago on 1/17. This included an angioplasty of the left superficial femoral and popliteal arteries as well as a percutaneous transluminal angioplasty of the left posterior tibial artery. Nevertheless he is here for a plantar wound that the patient says is been there for the last 6 months. He initially told us that he is applying Neosporin to this however he tells me also that he has been in a nursing home for the last year. I found that to be a somewhat on usual combination of fax. In any case the patient is a diabetic with diabetic PAD. 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 His ABI on the left side today was 0.62 01/11/16; we have been using Prisma to the wound areas on these first metatarsal head on the left and the small wound on his left anterior ankle. He states that the area on his left metatarsal head was not changed all week, this might account for why there is so much maceration here. In the meantime he tells me he  has fallen and hurt his shoulder x-ray is negative. He is also complaining of shortness of breath Electronic Signature(s) Signed: 01/12/2016 8:09:37 AM By: Baltazar Najjar MD Entered By: Baltazar Najjar on 01/11/2016  10:25:41 Lamison, Vedanth D. ([id number]) -------------------------------------------------------------------------------- Physical Exam Details Patient Name: Kleinschmidt, [name] Date of Service: 01/11/2016 10:00 AM Medical Record Patient Account Number: [id number] Number: Treating RN: Clover Mealy, RN, BSN, Rita January 20, 1956 [phone] y.o. Other Clinician: Date of Birth/Sex: Male) Treating ROBSON, Shylo Primary Care Physician: Joen Laura Physician/Extender: G Referring Physician: Tedra Sen[place] in Treatment: 2 Constitutional Sitting or standing Blood Pressure is within target range for patient.. Pulse regular and within target range for patient.Marland Kitchen Respirations regular, non-labored and within target range.. Temperature is normal and within the target range for the patient.Marland Kitchen Respiratory Respiratory effort is easy and symmetric bilaterally. Rate is normal at rest and on room air.. Bilateral breath sounds are clear and equal in all lobes with no wheezes, rales or rhonchi.. Cardiovascular Heart rhythm and rate regular, without murmur or gallop. No evidence of CHF. Pedal pulses absent on the left. Previous right amputation no edema on the left. Musculoskeletal He cannot abduct his right shoulder in fact Move the shoulder at all. I also note he doesn't seem to have tension of his biceps tendon on flexion of the elbow.. Notes Wound exam; the area over his left first metatarsal head had macerated tissue around there was debridement. The surface of the wound also was divided of surface slough and nonviable subcutaneous tissue. There is no evidence of surrounding infection at this point. He has completed a course of doxycycline. Electronic Signature(s) Signed: 01/12/2016 8:09:37 AM By:  Baltazar Najjar MD Entered By: Baltazar Najjar on 01/11/2016 10:27:32 Gayler, [name] ([id number]) -------------------------------------------------------------------------------- Physician Orders Details Patient Name: Tyler Milo D. Date of Service: 01/11/2016 10:00 AM Medical Record Patient Account Number: [id number] Number: Treating RN: Clover Mealy, RN, BSN, Rita June 01, 1956 [phone] y.o. Other Clinician: Date of Birth/Sex: Male) Treating ROBSON, Garhett Primary Care Physician: Joen Laura Physician/Extender: G Referring Physician: Tedra Senegal in Treatment: 2 Verbal / Phone Orders: Yes Clinician: Afful, RN, BSN, Rita Read Back and Verified: Yes Diagnosis Coding Wound Cleansing Wound #1 Left,Anterior Lower Leg o Clean wound with Normal Saline. Wound #2 Left,Plantar Foot o Clean wound with Normal Saline. Anesthetic Wound #1 Left,Anterior Lower Leg o Topical Lidocaine 4% cream applied to wound bed prior to debridement Wound #2 Left,Plantar Foot o Topical Lidocaine 4% cream applied to wound bed prior to debridement Primary Wound Dressing Wound #1 Left,Anterior Lower Leg o Prisma Ag Wound #2 Left,Plantar Foot o Prisma Ag Secondary Dressing Wound #1 Left,Anterior Lower Leg o Gauze and Kerlix/Conform Wound #2 Left,Plantar Foot o Foam Dressing Change Frequency Wound #1 Left,Anterior Lower Leg o Change dressing every other day. Wound #2 Left,Plantar Foot o Change dressing every other day. CASIN, FEDERICI ([id number]) Follow-up Appointments Wound #1 Left,Anterior Lower Leg o Return Appointment in 1 week. Wound #2 Left,Plantar Foot o Return Appointment in 1 week. Off-Loading Wound #2 Left,Plantar Foot o Open toe surgical shoe with peg assist. Additional Orders / Instructions Wound #1 Left,Anterior Lower Leg o Increase protein intake. o Activity as tolerated Wound #2 Left,Plantar Foot o Increase protein intake. o Activity as  tolerated Electronic Signature(s) Signed: 01/11/2016 4:37:25 PM By: Elpidio Eric BSN, RN Signed: 01/12/2016 8:09:37 AM By: Baltazar Najjar MD Entered By: Elpidio Eric on 01/11/2016 10:22:12 Kram, [name] ([id number]) -------------------------------------------------------------------------------- Problem List Details Patient Name: Reffner, Rodell D. Date of Service: 01/11/2016 10:00 AM Medical Record Patient Account Number: [id number] Number: Treating RN: Clover Mealy, RN, BSN, Rita 1956/08/14 [phone] y.o. 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 ([id number]) -------------------------------------------------------------------------------- Progress Note Details Patient Name: Tyler Milo D. Date of Service: 01/11/2016 10:00 AM Medical Record Patient Account Number: [id number] Number: Treating RN: Clover Mealy, RN, BSN, Rita May 21, 1956 [phone] y.o. Other Clinician: Date of Birth/Sex: Male) Treating ROBSON, Onis Primary Care Physician: Joen Laura Physician/Extender: G Referring Physician: Tedra [place] in Treatment: 2 Subjective Chief Complaint Information obtained from Patient Patient is here for review of a wound on his left first metatarsal head that is been there for the last 6 months History of Present Illness (HPI) 12/28/15.  This patient is a type II diabetic on insulin. He had vascular interventions 2 weeks ago on 1/17. This included an angioplasty of the left superficial femoral and popliteal arteries as well as a percutaneous transluminal angioplasty of the left posterior tibial artery. Nevertheless he is here for a plantar wound that the patient says is been there for the last 6 months. He initially told us that he is applying Neosporin to this however he tells me also that he has been in a nursing home for the last year. I found that to be a somewhat on usual combination of fax. In any case the patient is a diabetic with diabetic PAD. 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 His ABI on the left side today was 0.62 01/11/16; we have been using Prisma to the wound areas on these first metatarsal head on the left and the small wound on his left anterior ankle. He states that the area on his left metatarsal head was not changed all week, this might account for why there is so much maceration here. In the meantime he tells me he has fallen and hurt his shoulder x-ray is negative. He is also complaining of shortness of breath Objective Constitutional Sitting or standing Blood Pressure is within target range for patient.. Pulse regular and within target range for patient.Marland Kitchen Respirations regular, non-labored and within target range.. Temperature is normal and within the target range for the patient.. Vitals Time Taken: 9:57 AM, Height: 67 in, Weight: 195 lbs, BMI: 30.5, Temperature: 97.6 F, Pulse: 70 bpm, Respiratory Rate: 17 breaths/min, Blood Pressure: 121/58 mmHg. Respiratory Respiratory effort is easy and symmetric bilaterally. Rate is normal at rest and on room air.. Bilateral breath sounds are clear and equal in all lobes with no wheezes, rales or rhonchi.Flora Lipps ([id number]) Cardiovascular Heart rhythm and rate regular, without murmur or gallop. No evidence of CHF.  Pedal pulses absent on the left. Previous right amputation no edema on the left. Musculoskeletal He cannot abduct his right shoulder in fact Move the shoulder at all. I also note he doesn't seem to have tension of his biceps tendon on flexion of the elbow.. General Notes: Wound exam; the area over his left first metatarsal head had macerated tissue around there was debridement. The surface of the wound also was divided of surface slough and nonviable subcutaneous tissue. There is no evidence of surrounding infection at this point. He has  completed a course of doxycycline. Integumentary (Hair, Skin) Wound #1 status is Open. Original cause of wound was Gradually Appeared. The wound is located on the Left,Anterior Lower Leg. The wound measures 0.5cm length x 0.4cm width x 0.1cm depth; 0.157cm^2 area and 0.016cm^3 volume. The wound is limited to skin breakdown. There is no tunneling or undermining noted. There is a small amount of sanguinous drainage noted. The wound margin is distinct with the outline attached to the wound base. There is no granulation within the wound bed. There is no necrotic tissue within the wound bed. The periwound skin appearance exhibited: Moist. The periwound skin appearance did not exhibit: Callus, Crepitus, Excoriation, Fluctuance, Friable, Induration, Localized Edema, Rash, Scarring, Dry/Scaly, Maceration, Atrophie Blanche, Cyanosis, Ecchymosis, Hemosiderin Staining, Mottled, Pallor, Rubor, Erythema. Periwound temperature was noted as No Abnormality. Wound #2 status is Open. Original cause of wound was Gradually Appeared. The wound is located on the Left,Plantar Foot. The wound measures 1.5cm length x 1.2cm width x 0.2cm depth; 1.414cm^2 area and 0.283cm^3 volume. The wound is limited to skin breakdown. There is no tunneling or undermining noted. There is a small amount of serosanguineous drainage noted. The wound margin is distinct with the outline attached to the  wound base. There is medium (34-66%) pink granulation within the wound bed. There is a small (1-33%) amount of necrotic tissue within the wound bed including Adherent Slough. The periwound skin appearance exhibited: Callus, Maceration, Moist. The periwound skin appearance did not exhibit: Crepitus, Excoriation, Fluctuance, Friable, Induration, Localized Edema, Rash, Scarring, Dry/Scaly, Atrophie Blanche, Cyanosis, Ecchymosis, Hemosiderin Staining, Mottled, Pallor, Rubor, Erythema. Periwound temperature was noted as No Abnormality. Assessment Active Problems ICD-10 L97.522 - Non-pressure chronic ulcer of other part of left foot with fat layer exposed E11.621 - Type 2 diabetes mellitus with foot ulcer E11.51 - Type 2 diabetes mellitus with diabetic peripheral angiopathy without gangrene Losurdo, Timohty D. ([id number]) Procedures Wound #2 Wound #2 is a Diabetic Wound/Ulcer of the Lower Extremity located on the Left,Plantar Foot . There was a Skin/Subcutaneous Tissue Debridement ([id number]) debridement with total area of 1.8 sq cm performed by [name], MD. with the following instrument(s): Curette to remove Non-Viable tissue/material including Exudate, Fibrin/Slough, Callus, and Subcutaneous after achieving pain control using Lidocaine 4% Topical Solution. A time out was conducted prior to the start of the procedure. A Minimum amount of bleeding was controlled with Pressure. The procedure was tolerated well with a pain level of 0 throughout and a pain level of 0 following the procedure. Post Debridement Measurements: 1.5cm length x 1.2cm width x 0.2cm depth; 0.283cm^3 volume. Post procedure Diagnosis Wound #2: Same as Pre-Procedure Plan Wound Cleansing: Wound #1 Left,Anterior Lower Leg: Clean wound with Normal Saline. Wound #2 Left,Plantar Foot: Clean wound with Normal Saline. Anesthetic: Wound #1 Left,Anterior Lower Leg: Topical Lidocaine 4% cream applied to wound bed prior to  debridement Wound #2 Left,Plantar Foot: Topical Lidocaine 4% cream applied to wound bed prior to debridement Primary Wound Dressing: Wound #1 Left,Anterior Lower Leg: Prisma Ag Wound #2 Left,Plantar Foot: Prisma Ag Secondary Dressing: Wound #1 Left,Anterior Lower Leg: Gauze and Kerlix/Conform Wound #2 Left,Plantar Foot: Foam Dressing Change Frequency: Wound #1 Left,Anterior Lower Leg: Change dressing every other day. Wound #2 Left,Plantar Foot: Change dressing every other day. Follow-up Appointments: Wound #1 Left,Anterior Lower Leg: Levings, Ruston D. ([id number]) Return Appointment in 1 week. Wound #2 Left,Plantar Foot: Return Appointment in 1 week. Off-Loading: Wound #2 Left,Plantar Foot: Open toe surgical shoe with peg assist. Additional Orders /  Instructions: Wound #1 Left,Anterior Lower Leg: Increase protein intake. Activity as tolerated Wound #2 Left,Plantar Foot: Increase protein intake. Activity as tolerated #1 we will continue with the Silver collagen/Prisma/foam dressings to the left first metatarsal head and the wound on his anterior left leg. #2 we still do not have the x-ray of the foot that was done in the facility #3 likely significant PAD although he is already status post interventions even before we saw him the first time [please see HPI] #4 I wonder if he has had a torn rotator cuff and/or biceps tendon in the brief period of time I looked at his right shoulder. I written a note for him to see orthopedics ASAP. I understand that the x-ray of the shoulder was negative from the patient. Electronic Signature(s) Signed: 01/12/2016 8:09:37 AM By: Baltazar Najjar MD Entered By: Baltazar Najjar on 01/11/2016 10:29:43 Nowaczyk, [name] ([id number]) -------------------------------------------------------------------------------- SuperBill Details Patient Name: Tyler Milo D. Date of Service: 01/11/2016 Medical Record Patient Account Number:  [id number] Number: Treating RN: Clover Mealy, RN, BSN, Rita 05-15-1956 [phone] y.o. Other Clinician: Date of Birth/Sex: Male) Treating ROBSON, Eann Primary Care Physician: Joen Laura Physician/Extender: G Referring Physician: Tedra [place] in Treatment: 2 Diagnosis Coding ICD-10 Codes Code Description [phone] Non-pressure chronic ulcer of other part of left foot with fat layer exposed E11.621 Type 2 diabetes mellitus with foot ulcer E11.51 Type 2 diabetes mellitus with diabetic peripheral angiopathy without gangrene Facility Procedures CPT4 Code: [id number] Description: 11042 - DEB SUBQ TISSUE 20 SQ CM/< ICD-10 Description Diagnosis E11.621 Type 2 diabetes mellitus with foot ulcer Modifier: Quantity: 1 Physician Procedures CPT4 Code: [id number] Description: 11042 - WC PHYS SUBQ TISS 20 SQ CM ICD-10 Description Diagnosis E11.621 Type 2 diabetes mellitus with foot ulcer Modifier: Quantity: 1 Electronic Signature(s) Signed: 01/12/2016 8:09:37 AM By: Baltazar Najjar MD Entered By: Baltazar Najjar on 01/11/2016 10:30:08

## 2016-01-12 NOTE — Progress Notes (Signed)
ANSELMO, REIHL ([id number]) Visit Report for 01/11/2016 Arrival Information Details Patient Name: HARUKI, ARNOLD. Date of Service: 01/11/2016 10:00 AM Medical Record Number: [id number] Patient Account Number: [id number] Date of Birth/Sex: [birth year] (60 y.o. Male) Treating RN: Clover Mealy, RN, BSN, [place] Sink Primary Care Physician: Joen Laura Other Clinician: Referring Physician: Joen Laura Treating Physician/Extender: Altamese [place] in Treatment: 2 Visit Information History Since Last Visit Added or deleted any medications: No Patient Arrived: Wheel Chair Any new allergies or adverse reactions: No Arrival Time: 09:55 Had a fall or experienced change in No activities of daily living that may affect Accompanied By: self risk of falls: Transfer Assistance: None Signs or symptoms of abuse/neglect since last No Patient Identification Verified: Yes visito Secondary Verification Process Yes Hospitalized since last visit: No Completed: Has Dressing in Place as Prescribed: Yes Patient Requires Transmission-Based No Pain Present Now: No Precautions: Patient Has Alerts: No Electronic Signature(s) Signed: 01/11/2016 4:37:25 PM By: Elpidio Eric BSN, RN Entered By: Elpidio Eric on 01/11/2016 09:56:51 Gaal, Fallou Algis Downs ([id number]) -------------------------------------------------------------------------------- Encounter Discharge Information Details Patient Name: [name] Date of Service: 01/11/2016 10:00 AM Medical Record Number: [id number] Patient Account Number: [id number] Date of Birth/Sex: [birth year] (60 y.o. Male) Treating RN: Clover Mealy, RN, BSN, [place] Sink Primary Care Physician: Joen Laura Other Clinician: Referring Physician: Joen Laura Treating Physician/Extender: Altamese [place] in Treatment: 2 Encounter Discharge Information Items Discharge Pain Level: 0 Discharge Condition: Stable Ambulatory Status: Wheelchair Discharge Destination: Nursing Home Transportation:  Other Accompanied By: self Schedule Follow-up Appointment: No Medication Reconciliation completed and provided to Patient/Care No Rhyanna Sorce: Provided on Clinical Summary of Care: 01/11/2016 Form Type Recipient Paper Patient [facility] Electronic Signature(s) Signed: 01/11/2016 4:37:25 PM By: Elpidio Eric BSN, RN Previous Signature: 01/11/2016 10:25:11 AM Version By: Gwenlyn Perking Entered By: Elpidio Eric on 01/11/2016 10:26:57 Cassady, Rommie D. ([id number]) -------------------------------------------------------------------------------- Lower Extremity Assessment Details Patient Name: Scheu, Karlin D. Date of Service: 01/11/2016 10:00 AM Medical Record Number: [id number] Patient Account Number: [id number] Date of Birth/Sex: October 12, 1956 (60 y.o. Male) Treating RN: Clover Mealy, RN, BSN, [place] Sink Primary Care Physician: Joen Laura Other Clinician: Referring Physician: Joen Laura Treating Physician/Extender: [name] Weeks in Treatment: 2 Edema Assessment Assessed: [Left: No] [Right: No] E[Left: dema] [Right: :] Calf Left: Right: Point of Measurement: 34 cm From Medial Instep cm cm Ankle Left: Right: Point of Measurement: 8 cm From Medial Instep cm cm Vascular Assessment Pulses: Posterior Tibial Dorsalis Pedis Palpable: [Left:Yes] Extremity colors, hair growth, and conditions: Extremity Color: [Left:Mottled] Hair Growth on Extremity: [Left:Yes] Capillary Refill: [Left:< 3 seconds] Toe Nail Assessment Left: Right: Thick: Yes Discolored: Yes Deformed: No Improper Length and Hygiene: No Electronic Signature(s) Signed: 01/11/2016 4:37:25 PM By: Elpidio Eric BSN, RN Entered By: Elpidio Eric on 01/11/2016 09:57:53 Adney, Frandy D. ([id number]) -------------------------------------------------------------------------------- Multi Wound Chart Details Patient Name: [name] Date of Service: 01/11/2016 10:00 AM Medical Record Number: [id number] Patient Account Number: [id number] Date of  Birth/Sex: Apr 23, 1956 (60 y.o. Male) Treating RN: Clover Mealy, RN, BSN, [place] Sink Primary Care Physician: Joen Laura Other Clinician: Referring Physician: Joen Laura Treating Physician/Extender: [name] Weeks in Treatment: 2 Vital Signs Height(in): 67 Pulse(bpm): 70 Weight(lbs): 195 Blood Pressure 121/58 (mmHg): Body Mass Index(BMI): 31 Temperature(F): 97.6 Respiratory Rate 17 (breaths/min): Photos: [1:No Photos] [2:No Photos] [N/A:N/A] Wound Location: [1:Left Lower Leg - Anterior] [2:Left Foot - Plantar] [N/A:N/A] Wounding Event: [1:Gradually Appeared] [2:Gradually Appeared] [N/A:N/A] Primary Etiology: [1:Diabetic Wound/Ulcer of the Lower Extremity] [2:Diabetic Wound/Ulcer of the Lower Extremity] [N/A:N/A] Comorbid History: [1:Anemia, Chronic  Obstructive Pulmonary Disease (COPD), Arrhythmia, Congestive Heart Failure, Coronary Artery Disease, Hypertension, Type II Diabetes, Neuropathy] [2:Anemia, Chronic Obstructive Pulmonary Disease (COPD), Arrhythmia,  Congestive Heart Failure, Coronary Artery Disease, Hypertension, Type II Diabetes, Neuropathy] [N/A:N/A] Date Acquired: [1:12/21/2014] [2:12/21/2014] [N/A:N/A] Weeks of Treatment: [1:2] [2:2] [N/A:N/A] Wound Status: [1:Healed - Epithelialized] [2:Open] [N/A:N/A] Measurements L x W x D 0x0x0 [2:1.5x1.2x0.2] [N/A:N/A] (cm) Area (cm) : [1:0] [2:1.414] [N/A:N/A] Volume (cm) : [1:0] [2:0.283] [N/A:N/A] % Reduction in Area: [1:100.00%] [2:-80.10%] [N/A:N/A] % Reduction in Volume: 100.00% [2:-19.90%] [N/A:N/A] Classification: [1:Grade 0] [2:Grade 1] [N/A:N/A] Exudate Amount: [1:None Present] [2:Small] [N/A:N/A] Exudate Type: [1:N/A] [2:Serosanguineous] [N/A:N/A] Exudate Color: [1:N/A] [2:red, brown] [N/A:N/A] Wound Margin: [1:Distinct, outline attached] [2:Distinct, outline attached] [N/A:N/A] Granulation Amount: [1:None Present (0%)] [2:Medium (34-66%)] [N/A:N/A] Granulation Quality: [1:N/A] [2:Pink] [N/A:N/A] Necrotic Amount:  [1:None Present (0%)] [2:Small (1-33%)] [N/A:N/A] Exposed Structures: Fascia: No Fascia: No N/A Fat: No Fat: No Tendon: No Tendon: No Muscle: No Muscle: No Joint: No Joint: No Bone: No Bone: No Limited to Skin Limited to Skin Breakdown Breakdown Epithelialization: Large (67-100%) None N/A Periwound Skin Texture: Edema: No Callus: Yes N/A Excoriation: No Edema: No Induration: No Excoriation: No Callus: No Induration: No Crepitus: No Crepitus: No Fluctuance: No Fluctuance: No Friable: No Friable: No Rash: No Rash: No Scarring: No Scarring: No Periwound Skin Dry/Scaly: Yes Maceration: Yes N/A Moisture: Maceration: No Moist: Yes Moist: No Dry/Scaly: No Periwound Skin Color: Atrophie Blanche: No Atrophie Blanche: No N/A Cyanosis: No Cyanosis: No Ecchymosis: No Ecchymosis: No Erythema: No Erythema: No Hemosiderin Staining: No Hemosiderin Staining: No Mottled: No Mottled: No Pallor: No Pallor: No Rubor: No Rubor: No Temperature: No Abnormality No Abnormality N/A Tenderness on No No N/A Palpation: Wound Preparation: Ulcer Cleansing: Ulcer Cleansing: N/A Rinsed/Irrigated with Rinsed/Irrigated with Saline Saline Topical Anesthetic Topical Anesthetic Applied: None Applied: Other: lidocaine 4% Treatment Notes Electronic Signature(s) Signed: 01/11/2016 4:37:25 PM By: Elpidio Eric BSN, RN Entered By: Elpidio Eric on 01/11/2016 10:05:26 Allshouse, [name] ([id number]) -------------------------------------------------------------------------------- Multi-Disciplinary Care Plan Details Patient Name: [name] Date of Service: 01/11/2016 10:00 AM Medical Record Number: [id number] Patient Account Number: [id number] Date of Birth/Sex: 12-23-1955 (60 y.o. Male) Treating RN: Clover Mealy, RN, BSN, [place] Sink Primary Care Physician: Joen Laura Other Clinician: Referring Physician: Joen Laura Treating Physician/Extender: Altamese [place] in Treatment: 2 Active  Inactive Abuse / Safety / Falls / Self Care Management Nursing Diagnoses: Impaired home maintenance Impaired physical mobility Knowledge deficit related to: safety; personal, health (wound), emergency Potential for falls Self care deficit: actual or potential Goals: Patient will remain injury free Date Initiated: 12/28/2015 Goal Status: Active Patient/caregiver will verbalize understanding of skin care regimen Date Initiated: 12/28/2015 Goal Status: Active Patient/caregiver will verbalize/demonstrate measure taken to improve self care Date Initiated: 12/28/2015 Goal Status: Active Patient/caregiver will verbalize/demonstrate measures taken to improve the patient's personal safety Date Initiated: 12/28/2015 Goal Status: Active Patient/caregiver will verbalize/demonstrate measures taken to prevent injury and/or falls Date Initiated: 12/28/2015 Goal Status: Active Patient/caregiver will verbalize/demonstrate understanding of what to do in case of emergency Date Initiated: 12/28/2015 Goal Status: Active Interventions: Assess fall risk on admission and as needed Assess: immobility, friction, shearing, incontinence upon admission and as needed Assess impairment of mobility on admission and as needed per policy Assess self care needs on admission and as needed Provide education on basic hygiene Matusek, TARVIS BLOSSOM. ([id number]) Provide education on fall prevention Provide education on personal and home safety Provide education on safe transfers Treatment Activities: Education provided on Basic Hygiene : 12/28/2015  Notes: Orientation to the Wound Care Program Nursing Diagnoses: Knowledge deficit related to the wound healing center program Goals: Patient/caregiver will verbalize understanding of the Wound Healing Center Program Date Initiated: 12/28/2015 Goal Status: Active Interventions: Provide education on orientation to the wound center Notes: Venous Leg Ulcer Nursing  Diagnoses: Knowledge deficit related to disease process and management Potential for venous Insuffiency (use before diagnosis confirmed) Goals: Non-invasive venous studies are completed as ordered Date Initiated: 12/28/2015 Goal Status: Active Patient/caregiver will verbalize understanding of disease process and disease management Date Initiated: 12/28/2015 Goal Status: Active Verify adequate tissue perfusion prior to therapeutic compression application Date Initiated: 12/28/2015 Goal Status: Active Interventions: Assess peripheral edema status every visit. Provide education on venous insufficiency Notes: Manfredi, Moises D. ([id number]) Wound/Skin Impairment Nursing Diagnoses: Impaired tissue integrity Knowledge deficit related to smoking impact on wound healing Knowledge deficit related to ulceration/compromised skin integrity Goals: Patient/caregiver will verbalize understanding of skin care regimen Date Initiated: 12/28/2015 Goal Status: Active Ulcer/skin breakdown will have a volume reduction of 30% by week 4 Date Initiated: 12/28/2015 Goal Status: Active Ulcer/skin breakdown will have a volume reduction of 50% by week 8 Date Initiated: 12/28/2015 Goal Status: Active Ulcer/skin breakdown will have a volume reduction of 80% by week 12 Date Initiated: 12/28/2015 Goal Status: Active Ulcer/skin breakdown will heal within 14 weeks Date Initiated: 12/28/2015 Goal Status: Active Interventions: Assess patient/caregiver ability to obtain necessary supplies Assess patient/caregiver ability to perform ulcer/skin care regimen upon admission and as needed Assess ulceration(s) every visit Provide education on ulcer and skin care Notes: Electronic Signature(s) Signed: 01/11/2016 4:37:25 PM By: Elpidio Eric BSN, RN Entered By: Elpidio Eric on 01/11/2016 10:05:17 Flippen, Jakayden D. ([id number]) -------------------------------------------------------------------------------- Pain Assessment  Details Patient Name: [name] Date of Service: 01/11/2016 10:00 AM Medical Record Number: [id number] Patient Account Number: [id number] Date of Birth/Sex: 01-13-1956 (60 y.o. Male) Treating RN: Clover Mealy, RN, BSN, [place] Sink Primary Care Physician: Joen Laura Other Clinician: Referring Physician: Joen Laura Treating Physician/Extender: [name] Weeks in Treatment: 2 Active Problems Location of Pain Severity and Description of Pain Patient Has Paino No Site Locations Pain Management and Medication Current Pain Management: Electronic Signature(s) Signed: 01/11/2016 4:37:25 PM By: Elpidio Eric BSN, RN Entered By: Elpidio Eric on 01/11/2016 09:56:57 Mctavish, [name] ([id number]) -------------------------------------------------------------------------------- Patient/Caregiver Education Details Patient Name: [name] Date of Service: 01/11/2016 10:00 AM Medical Record Number: [id number] Patient Account Number: [id number] Date of Birth/Gender: 11/19/1955 (60 y.o. Male) Treating RN: Clover Mealy, RN, BSN, [place] Sink Primary Care Physician: Joen Laura Other Clinician: Referring Physician: Joen Laura Treating Physician/Extender: Altamese [place] in Treatment: 2 Education Assessment Education Provided To: Patient Education Topics Provided Basic Hygiene: Methods: Explain/Verbal Responses: State content correctly Safety: Methods: Explain/Verbal Responses: State content correctly Venous: Methods: Explain/Verbal Responses: State content correctly Welcome To The Wound Care Center: Methods: Explain/Verbal Responses: State content correctly Wound/Skin Impairment: Methods: Explain/Verbal Responses: State content correctly Electronic Signature(s) Signed: 01/11/2016 4:37:25 PM By: Elpidio Eric BSN, RN Entered By: Elpidio Eric on 01/11/2016 10:27:19 Hoerner, Ireland D. ([id number]) -------------------------------------------------------------------------------- Wound Assessment  Details Patient Name: Betsill, Jim D. Date of Service: 01/11/2016 10:00 AM Medical Record Number: [id number] Patient Account Number: [id number] Date of Birth/Sex: [birth year] (60 y.o. Male) Treating RN: Clover Mealy, RN, BSN,  Sink Primary Care Physician: Joen Laura Other Clinician: Referring Physician: Joen Laura Treating Physician/Extender: [name] Weeks in Treatment: 2 Wound Status Wound Number: 1 Primary Diabetic Wound/Ulcer of the Lower Etiology: Extremity Wound Location: Left Lower Leg - Anterior Wound Open Wounding  Event: Gradually Appeared Status: Date Acquired: 12/21/2014 Comorbid Anemia, Chronic Obstructive Pulmonary Weeks Of Treatment: 2 History: Disease (COPD), Arrhythmia, Clustered Wound: No Congestive Heart Failure, Coronary Artery Disease, Hypertension, Type II Diabetes, Neuropathy Photos Photo Uploaded By: Elpidio Eric on 01/11/2016 16:33:25 Wound Measurements Length: (cm) 0.5 Width: (cm) 0.4 Depth: (cm) 0.1 Area: (cm) 0.157 Volume: (cm) 0.016 % Reduction in Area: 99.9% % Reduction in Volume: 99.9% Epithelialization: Large (67-100%) Tunneling: No Undermining: No Wound Description Classification: Grade 0 Wound Margin: Distinct, outline attached Exudate Amount: Small Exudate Type: Sanguinous Exudate Color: red Foul Odor After Cleansing: No Wound Bed Granulation Amount: None Present (0%) Exposed Structure Necrotic Amount: None Present (0%) Fascia Exposed: No Malter, Jasiel D. ([id number]) Fat Layer Exposed: No Tendon Exposed: No Muscle Exposed: No Joint Exposed: No Bone Exposed: No Limited to Skin Breakdown Periwound Skin Texture Texture Color No Abnormalities Noted: No No Abnormalities Noted: No Callus: No Atrophie Blanche: No Crepitus: No Cyanosis: No Excoriation: No Ecchymosis: No Fluctuance: No Erythema: No Friable: No Hemosiderin Staining: No Induration: No Mottled: No Localized Edema: No Pallor: No Rash: No Rubor:  No Scarring: No Temperature / Pain Moisture Temperature: No Abnormality No Abnormalities Noted: No Dry / Scaly: No Maceration: No Moist: Yes Wound Preparation Ulcer Cleansing: Rinsed/Irrigated with Saline Topical Anesthetic Applied: None Treatment Notes Wound #1 (Left, Anterior Lower Leg) 1. Cleansed with: Clean wound with Normal Saline 4. Dressing Applied: Prisma Ag 5. Secondary Dressing Applied Gauze and Kerlix/Conform 6. Footwear/Offloading device applied Felt/Foam Other footwear/offloading device applied (specify in notes) 7. Secured with Tape Notes Darco with Peg Assist Electronic Signature(s) ABASS, MISENER ([id number]) Signed: 01/11/2016 4:37:25 PM By: Elpidio Eric BSN, RN Entered By: Elpidio Eric on 01/11/2016 10:16:57 Swab, Leonid DMarland Kitchen ([id number]) -------------------------------------------------------------------------------- Wound Assessment Details Patient Name: Olvera, Nuri D. Date of Service: 01/11/2016 10:00 AM Medical Record Number: [id number] Patient Account Number: [id number] Date of Birth/Sex: [birth year] (60 y.o. Male) Treating RN: Clover Mealy, RN, BSN, [place] Sink Primary Care Physician: Joen Laura Other Clinician: Referring Physician: Joen Laura Treating Physician/Extender: [name] Weeks in Treatment: 2 Wound Status Wound Number: 2 Primary Diabetic Wound/Ulcer of the Lower Etiology: Extremity Wound Location: Left Foot - Plantar Wound Open Wounding Event: Gradually Appeared Status: Date Acquired: 12/21/2014 Comorbid Anemia, Chronic Obstructive Pulmonary Weeks Of Treatment: 2 History: Disease (COPD), Arrhythmia, Clustered Wound: No Congestive Heart Failure, Coronary Artery Disease, Hypertension, Type II Diabetes, Neuropathy Photos Photo Uploaded By: Elpidio Eric on 01/11/2016 16:33:25 Wound Measurements Length: (cm) 1.5 Width: (cm) 1.2 Depth: (cm) 0.2 Area: (cm) 1.414 Volume: (cm) 0.283 % Reduction in Area: -80.1% % Reduction in Volume:  -19.9% Epithelialization: None Tunneling: No Undermining: No Wound Description Classification: Grade 1 Wound Margin: Distinct, outline attached Timothy, Azeem D. ([id number]) Foul Odor After Cleansing: No Exudate Amount: Small Exudate Type: Serosanguineous Exudate Color: red, brown Wound Bed Granulation Amount: Medium (34-66%) Exposed Structure Granulation Quality: Pink Fascia Exposed: No Necrotic Amount: Small (1-33%) Fat Layer Exposed: No Necrotic Quality: Adherent Slough Tendon Exposed: No Muscle Exposed: No Joint Exposed: No Bone Exposed: No Limited to Skin Breakdown Periwound Skin Texture Texture Color No Abnormalities Noted: No No Abnormalities Noted: No Callus: Yes Atrophie Blanche: No Crepitus: No Cyanosis: No Excoriation: No Ecchymosis: No Fluctuance: No Erythema: No Friable: No Hemosiderin Staining: No Induration: No Mottled: No Localized Edema: No Pallor: No Rash: No Rubor: No Scarring: No Temperature / Pain Moisture Temperature: No Abnormality No Abnormalities Noted: No Dry / Scaly: No Maceration: Yes Moist: Yes Wound Preparation Ulcer Cleansing: Rinsed/Irrigated with Saline  Topical Anesthetic Applied: Other: lidocaine 4%, Treatment Notes Wound #2 (Left, Plantar Foot) 1. Cleansed with: Clean wound with Normal Saline 4. Dressing Applied: Prisma Ag 5. Secondary Dressing Applied Gauze and Kerlix/Conform 6. Footwear/Offloading device applied Felt/Foam Ledbetter, Krish D. ([id number]) Other footwear/offloading device applied (specify in notes) 7. Secured with Tape Notes Darco with Higher education careers adviser) Signed: 01/11/2016 4:37:25 PM By: Elpidio Eric BSN, RN Entered By: Elpidio Eric on 01/11/2016 10:05:04 Savastano, Kross DMarland Kitchen ([id number]) -------------------------------------------------------------------------------- Vitals Details Patient Name: [name] Date of Service: 01/11/2016 10:00 AM Medical Record Number: [id number] Patient  Account Number: [id number] Date of Birth/Sex: [birth year] (60 y.o. Male) Treating RN: Afful, RN, BSN, [place] Sink Primary Care Physician: Joen Laura Other Clinician: Referring Physician: Joen Laura Treating Physician/Extender: Altamese [place] in Treatment: 2 Vital Signs Time Taken: 09:57 Temperature (F): 97.6 Height (in): 67 Pulse (bpm): 70 Weight (lbs): 195 Respiratory Rate (breaths/min): 17 Body Mass Index (BMI): 30.5 Blood Pressure (mmHg): 121/58 Reference Range: 80 - 120 mg / dl Electronic Signature(s) Signed: 01/11/2016 4:37:25 PM By: Elpidio Eric BSN, RN Entered By: Elpidio Eric on 01/11/2016 09:57:18

## 2016-01-18 ENCOUNTER — Encounter: Payer: Medicare Other | Attending: Internal Medicine | Admitting: Internal Medicine

## 2016-01-18 DIAGNOSIS — D649 Anemia, unspecified: Secondary | ICD-10-CM | POA: Diagnosis not present

## 2016-01-18 DIAGNOSIS — I110 Hypertensive heart disease with heart failure: Secondary | ICD-10-CM | POA: Diagnosis not present

## 2016-01-18 DIAGNOSIS — E11621 Type 2 diabetes mellitus with foot ulcer: Secondary | ICD-10-CM | POA: Insufficient documentation

## 2016-01-18 DIAGNOSIS — I2510 Atherosclerotic heart disease of native coronary artery without angina pectoris: Secondary | ICD-10-CM | POA: Insufficient documentation

## 2016-01-18 DIAGNOSIS — L97522 Non-pressure chronic ulcer of other part of left foot with fat layer exposed: Secondary | ICD-10-CM | POA: Insufficient documentation

## 2016-01-18 DIAGNOSIS — Z794 Long term (current) use of insulin: Secondary | ICD-10-CM | POA: Diagnosis not present

## 2016-01-18 DIAGNOSIS — E1151 Type 2 diabetes mellitus with diabetic peripheral angiopathy without gangrene: Secondary | ICD-10-CM | POA: Insufficient documentation

## 2016-01-18 DIAGNOSIS — J449 Chronic obstructive pulmonary disease, unspecified: Secondary | ICD-10-CM | POA: Diagnosis not present

## 2016-01-18 DIAGNOSIS — E1140 Type 2 diabetes mellitus with diabetic neuropathy, unspecified: Secondary | ICD-10-CM | POA: Insufficient documentation

## 2016-01-18 DIAGNOSIS — I509 Heart failure, unspecified: Secondary | ICD-10-CM | POA: Diagnosis not present

## 2016-01-19 NOTE — Progress Notes (Addendum)
Tyler, Frank ([id number]) Visit Report for 01/18/2016 Arrival Information Details Patient Name: [name], [name]. Date of Service: 01/18/2016 1:30 PM Medical Record Number: [id number] Patient Account Number: [id number] Date of Birth/Sex: Nov 30, 1955 (60 y.o. Male) Treating RN: Clover Mealy, RN, BSN, [place] Sink Primary Care Physician: Joen Laura Other Clinician: Referring Physician: Joen Laura Treating Physician/Extender: Altamese [place] in Treatment: 3 Visit Information History Since Last Visit Added or deleted any medications: No Patient Arrived: Wheel Chair Any new allergies or adverse reactions: No Arrival Time: 13:35 Had a fall or experienced change in No activities of daily living that may affect Accompanied By: self risk of falls: Transfer Assistance: None Signs or symptoms of abuse/neglect since last No Patient Identification Verified: Yes visito Secondary Verification Process Yes Hospitalized since last visit: No Completed: Has Dressing in Place as Prescribed: Yes Patient Requires Transmission-Based No Pain Present Now: No Precautions: Patient Has Alerts: No Electronic Signature(s) Signed: 01/18/2016 1:36:03 PM By: Elpidio Eric BSN, RN Entered By: Elpidio Eric on 01/18/2016 13:36:03 Boxell, Raymund Algis Downs ([id number]) -------------------------------------------------------------------------------- Encounter Discharge Information Details Patient Name: Tyler Milo D. Date of Service: 01/18/2016 1:30 PM Medical Record Number: [id number] Patient Account Number: [id number] Date of Birth/Sex: August 20, 1956 (60 y.o. Male) Treating RN: Clover Mealy, RN, BSN, [place] Sink Primary Care Physician: Joen Laura Other Clinician: Referring Physician: Joen Laura Treating Physician/Extender: Altamese [place] in Treatment: 3 Encounter Discharge Information Items Discharge Pain Level: 0 Discharge Condition: Stable Ambulatory Status: Wheelchair Discharge Destination: Home Transportation: Private  Auto Accompanied By: self Schedule Follow-up Appointment: No Medication Reconciliation completed No and provided to Patient/Care Jaquesha Boroff: Provided on Clinical Summary of Care: 01/18/2016 Form Type Recipient Paper Patient [facility] Electronic Signature(s) Signed: 01/18/2016 4:45:50 PM By: Elpidio Eric BSN, RN Previous Signature: 01/18/2016 2:02:16 PM Version By: Gwenlyn Perking Entered By: Elpidio Eric on 01/18/2016 14:04:03 Dunkel, Joedy D. ([id number]) -------------------------------------------------------------------------------- Lower Extremity Assessment Details Patient Name: Tyler, [name] Date of Service: 01/18/2016 1:30 PM Medical Record Number: [id number] Patient Account Number: [id number] Date of Birth/Sex: February 20, 1956 (60 y.o. Male) Treating RN: Clover Mealy, RN, BSN, [place] Sink Primary Care Physician: Joen Laura Other Clinician: Referring Physician: Joen Laura Treating Physician/Extender: [name] Weeks in Treatment: 3 Edema Assessment Assessed: [Left: No] [Right: No] E[Left: dema] [Right: :] Calf Left: Right: Point of Measurement: 34 cm From Medial Instep cm cm Ankle Left: Right: Point of Measurement: 8 cm From Medial Instep cm cm Vascular Assessment Pulses: Posterior Tibial Dorsalis Pedis Palpable: [Left:Yes] Extremity colors, hair growth, and conditions: Extremity Color: [Left:Mottled] Hair Growth on Extremity: [Left:No] Temperature of Extremity: [Left:Warm] Capillary Refill: [Left:< 3 seconds] Toe Nail Assessment Left: Right: Thick: Yes Discolored: Yes Deformed: No Improper Length and Hygiene: No Electronic Signature(s) Signed: 01/18/2016 1:36:49 PM By: Elpidio Eric BSN, RN Entered By: Elpidio Eric on 01/18/2016 13:36:49 Birkel, Lon D. ([id number]) -------------------------------------------------------------------------------- Multi Wound Chart Details Patient Name: Frank, [name] Date of Service: 01/18/2016 1:30 PM Medical Record Number: [id number] Patient Account  Number: [id number] Date of Birth/Sex: 05-Nov-1956 (60 y.o. Male) Treating RN: Clover Mealy, RN, BSN, [place] Sink Primary Care Physician: Joen Laura Other Clinician: Referring Physician: Joen Laura Treating Physician/Extender: [name] Weeks in Treatment: 3 Vital Signs Height(in): 67 Pulse(bpm): 71 Weight(lbs): 195 Blood Pressure 90/70 (mmHg): Body Mass Index(BMI): 31 Temperature(F): 98.3 Respiratory Rate 17 (breaths/min): Photos: [1:No Photos] [2:No Photos] [N/A:N/A] Wound Location: [1:Left Lower Leg - Anterior] [2:Left Foot - Plantar] [N/A:N/A] Wounding Event: [1:Gradually Appeared] [2:Gradually Appeared] [N/A:N/A] Primary Etiology: [1:Diabetic Wound/Ulcer of the Lower Extremity] [2:Diabetic Wound/Ulcer of the Lower Extremity] [N/A:N/A]  Comorbid History: [1:Anemia, Chronic Obstructive Pulmonary Disease (COPD), Arrhythmia, Congestive Heart Failure, Coronary Artery Disease, Hypertension, Type II Diabetes, Neuropathy] [2:Anemia, Chronic Obstructive Pulmonary Disease (COPD), Arrhythmia,  Congestive Heart Failure, Coronary Artery Disease, Hypertension, Type II Diabetes, Neuropathy] [N/A:N/A] Date Acquired: [1:12/21/2014] [2:12/21/2014] [N/A:N/A] Weeks of Treatment: [1:3] [2:3] [N/A:N/A] Wound Status: [1:Healed - Epithelialized] [2:Open] [N/A:N/A] Measurements L x W x D 0x0x0 [2:1.3x1x0.2] [N/A:N/A] (cm) Area (cm) : [1:0] [2:1.021] [N/A:N/A] Volume (cm) : [1:0] [2:0.204] [N/A:N/A] % Reduction in Area: [1:100.00%] [2:-30.10%] [N/A:N/A] % Reduction in Volume: 100.00% [2:13.60%] [N/A:N/A] Classification: [1:Grade 0] [2:Grade 1] [N/A:N/A] Exudate Amount: [1:None Present] [2:Small] [N/A:N/A] Exudate Type: [1:N/A] [2:Serosanguineous] [N/A:N/A] Exudate Color: [1:N/A] [2:red, brown] [N/A:N/A] Wound Margin: [1:Distinct, outline attached] [2:Distinct, outline attached] [N/A:N/A] Granulation Amount: [1:None Present (0%)] [2:Medium (34-66%)] [N/A:N/A] Granulation Quality: [1:N/A] [2:Pink]  [N/A:N/A] Necrotic Amount: [1:None Present (0%)] [2:Small (1-33%)] [N/A:N/A] Exposed Structures: Fascia: No Fascia: No N/A Fat: No Fat: No Tendon: No Tendon: No Muscle: No Muscle: No Joint: No Joint: No Bone: No Bone: No Limited to Skin Limited to Skin Breakdown Breakdown Epithelialization: None None N/A Periwound Skin Texture: Edema: No Callus: Yes N/A Excoriation: No Edema: No Induration: No Excoriation: No Callus: No Induration: No Crepitus: No Crepitus: No Fluctuance: No Fluctuance: No Friable: No Friable: No Rash: No Rash: No Scarring: No Scarring: No Periwound Skin Maceration: No Maceration: Yes N/A Moisture: Moist: No Moist: Yes Dry/Scaly: No Dry/Scaly: No Periwound Skin Color: Atrophie Blanche: No Atrophie Blanche: No N/A Cyanosis: No Cyanosis: No Ecchymosis: No Ecchymosis: No Erythema: No Erythema: No Hemosiderin Staining: No Hemosiderin Staining: No Mottled: No Mottled: No Pallor: No Pallor: No Rubor: No Rubor: No Temperature: No Abnormality No Abnormality N/A Tenderness on No No N/A Palpation: Wound Preparation: Ulcer Cleansing: Ulcer Cleansing: N/A Rinsed/Irrigated with Rinsed/Irrigated with Saline Saline Topical Anesthetic Topical Anesthetic Applied: None Applied: Other: lidocaine 4% Treatment Notes Electronic Signature(s) Signed: 01/18/2016 4:45:50 PM By: Elpidio Eric BSN, RN Entered By: Elpidio Eric on 01/18/2016 13:46:28 Bink, [name] ([id number]) -------------------------------------------------------------------------------- Multi-Disciplinary Care Plan Details Patient Name: Tyler Milo D. Date of Service: 01/18/2016 1:30 PM Medical Record Number: [id number] Patient Account Number: [id number] Date of Birth/Sex: 12/28/1955 (60 y.o. Male) Treating RN: Clover Mealy, RN, BSN, [place] Sink Primary Care Physician: Joen Laura Other Clinician: Referring Physician: Joen Laura Treating Physician/Extender: Altamese [place] in  Treatment: 3 Active Inactive Abuse / Safety / Falls / Self Care Management Nursing Diagnoses: Impaired home maintenance Impaired physical mobility Knowledge deficit related to: safety; personal, health (wound), emergency Potential for falls Self care deficit: actual or potential Goals: Patient will remain injury free Date Initiated: 12/28/2015 Goal Status: Active Patient/caregiver will verbalize understanding of skin care regimen Date Initiated: 12/28/2015 Goal Status: Active Patient/caregiver will verbalize/demonstrate measure taken to improve self care Date Initiated: 12/28/2015 Goal Status: Active Patient/caregiver will verbalize/demonstrate measures taken to improve the patient's personal safety Date Initiated: 12/28/2015 Goal Status: Active Patient/caregiver will verbalize/demonstrate measures taken to prevent injury and/or falls Date Initiated: 12/28/2015 Goal Status: Active Patient/caregiver will verbalize/demonstrate understanding of what to do in case of emergency Date Initiated: 12/28/2015 Goal Status: Active Interventions: Assess fall risk on admission and as needed Assess: immobility, friction, shearing, incontinence upon admission and as needed Assess impairment of mobility on admission and as needed per policy Assess self care needs on admission and as needed Provide education on basic hygiene Revard, JAYVION STEFANSKI ([id number]) Provide education on fall prevention Provide education on personal and home safety Provide education on safe transfers Treatment Activities: Education provided on Basic  Hygiene : 12/28/2015 Notes: Orientation to the Wound Care Program Nursing Diagnoses: Knowledge deficit related to the wound healing center program Goals: Patient/caregiver will verbalize understanding of the Wound Healing Center Program Date Initiated: 12/28/2015 Goal Status: Active Interventions: Provide education on orientation to the wound center Notes: Venous Leg  Ulcer Nursing Diagnoses: Knowledge deficit related to disease process and management Potential for venous Insuffiency (use before diagnosis confirmed) Goals: Non-invasive venous studies are completed as ordered Date Initiated: 12/28/2015 Goal Status: Active Patient/caregiver will verbalize understanding of disease process and disease management Date Initiated: 12/28/2015 Goal Status: Active Verify adequate tissue perfusion prior to therapeutic compression application Date Initiated: 12/28/2015 Goal Status: Active Interventions: Assess peripheral edema status every visit. Provide education on venous insufficiency Notes: Minjares, Kailin D. ([id number]) Wound/Skin Impairment Nursing Diagnoses: Impaired tissue integrity Knowledge deficit related to smoking impact on wound healing Knowledge deficit related to ulceration/compromised skin integrity Goals: Patient/caregiver will verbalize understanding of skin care regimen Date Initiated: 12/28/2015 Goal Status: Active Ulcer/skin breakdown will have a volume reduction of 30% by week 4 Date Initiated: 12/28/2015 Goal Status: Active Ulcer/skin breakdown will have a volume reduction of 50% by week 8 Date Initiated: 12/28/2015 Goal Status: Active Ulcer/skin breakdown will have a volume reduction of 80% by week 12 Date Initiated: 12/28/2015 Goal Status: Active Ulcer/skin breakdown will heal within 14 weeks Date Initiated: 12/28/2015 Goal Status: Active Interventions: Assess patient/caregiver ability to obtain necessary supplies Assess patient/caregiver ability to perform ulcer/skin care regimen upon admission and as needed Assess ulceration(s) every visit Provide education on ulcer and skin care Notes: Electronic Signature(s) Signed: 01/18/2016 4:45:50 PM By: Elpidio EricAfful, Rita BSN, RN Entered By: Elpidio EricAfful, Rita on 01/18/2016 13:46:20 Furnas, Odai D. ([id number]) -------------------------------------------------------------------------------- Pain  Assessment Details Patient Name: Tyler MiloHAM, Kier D. Date of Service: 01/18/2016 1:30 PM Medical Record Number: [id number] Patient Account Number: [id number] Date of Birth/Sex: 12-10-1955 [age] y.o. Male) Treating RN: Clover MealyAfful, RN, BSN, [place] Sinkita Primary Care Physician: Joen LauraBLISS, LAURA Other Clinician: Referring Physician: Joen LauraBLISS, LAURA Treating Physician/Extender: Maxwell CaulOBSON, Aubra G Weeks in Treatment: 3 Active Problems Location of Pain Severity and Description of Pain Patient Has Paino No Site Locations Pain Management and Medication Current Pain Management: Electronic Signature(s) Signed: 01/18/2016 1:36:12 PM By: Elpidio EricAfful, Rita BSN, RN Entered By: Elpidio EricAfful, Rita on 01/18/2016 13:36:11 Monjaraz, Veverly FellsMICHAEL D. ([id number]) -------------------------------------------------------------------------------- Patient/Caregiver Education Details Patient Name: Tyler MiloHAM, Anthonny D. Date of Service: 01/18/2016 1:30 PM Medical Record Number: [id number] Patient Account Number: [id number] Date of Birth/Gender: 12-10-1955 [age] y.o. Male) Treating RN: Clover MealyAfful, RN, BSN, [place] Sinkita Primary Care Physician: Joen LauraBLISS, LAURA Other Clinician: Referring Physician: Joen LauraBLISS, LAURA Treating Physician/Extender: Altamese CarolinaOBSON, Pablo G Weeks in Treatment: 3 Education Assessment Education Provided To: Patient Education Topics Provided Basic Hygiene: Methods: Explain/Verbal Responses: State content correctly Safety: Methods: Explain/Verbal Responses: State content correctly Venous: Methods: Explain/Verbal Responses: State content correctly Welcome To The Wound Care Center: Methods: Explain/Verbal Responses: State content correctly Wound/Skin Impairment: Methods: Explain/Verbal Responses: State content correctly Electronic Signature(s) Signed: 01/18/2016 4:45:50 PM By: Elpidio EricAfful, Rita BSN, RN Entered By: Elpidio EricAfful, Rita on 01/18/2016 14:04:26 Mazzola, Emmaus D. ([id number]) -------------------------------------------------------------------------------- Wound Assessment  Details Patient Name: Bartolucci, Alison D. Date of Service: 01/18/2016 1:30 PM Medical Record Number: [id number] Patient Account Number: [id number] Date of Birth/Sex: 12-10-1955 [age] y.o. Male) Treating RN: Clover MealyAfful, RN, BSN, [place] Sinkita Primary Care Physician: Joen LauraBLISS, LAURA Other Clinician: Referring Physician: Joen LauraBLISS, LAURA Treating Physician/Extender: Maxwell CaulOBSON, Delonta G Weeks in Treatment: 3 Wound Status Wound Number: 1 Primary Diabetic Wound/Ulcer of the Lower Etiology: Extremity Wound Location: Left Lower Leg - Anterior  Wound Healed - Epithelialized Wounding Event: Gradually Appeared Status: Date Acquired: 12/21/2014 Comorbid Anemia, Chronic Obstructive Pulmonary Weeks Of Treatment: 3 History: Disease (COPD), Arrhythmia, Clustered Wound: No Congestive Heart Failure, Coronary Artery Disease, Hypertension, Type II Diabetes, Neuropathy Wound Measurements Length: (cm) 0 % Reduct Width: (cm) 0 % Reduct Depth: (cm) 0 Epitheli Area: (cm) 0 Tunneli Volume: (cm) 0 Undermi ion in Area: 100% ion in Volume: 100% alization: None ng: No ning: No Wound Description Classification: Grade 0 Wound Margin: Distinct, outline attached Exudate Amount: None Present Foul Odor After Cleansing: No Wound Bed Granulation Amount: None Present (0%) Exposed Structure Necrotic Amount: None Present (0%) Fascia Exposed: No Fat Layer Exposed: No Tendon Exposed: No Muscle Exposed: No Joint Exposed: No Bone Exposed: No Limited to Skin Breakdown Periwound Skin Texture Texture Color No Abnormalities Noted: No No Abnormalities Noted: No Callus: No Atrophie Blanche: No Crepitus: No Cyanosis: No Excoriation: No Ecchymosis: No Karlen, Alpha D. ([id number]) Fluctuance: No Erythema: No Friable: No Hemosiderin Staining: No Induration: No Mottled: No Localized Edema: No Pallor: No Rash: No Rubor: No Scarring: No Temperature / Pain Moisture Temperature: No Abnormality No Abnormalities Noted: No Dry / Scaly:  No Maceration: No Moist: No Wound Preparation Ulcer Cleansing: Rinsed/Irrigated with Saline Topical Anesthetic Applied: None Electronic Signature(s) Signed: 01/18/2016 4:45:50 PM By: Elpidio Eric BSN, RN Entered By: Elpidio Eric on 01/18/2016 13:46:00 Holsopple, Gerold D. ([id number]) -------------------------------------------------------------------------------- Wound Assessment Details Patient Name: Lewis, Harold D. Date of Service: 01/18/2016 1:30 PM Medical Record Number: [id number] Patient Account Number: [id number] Date of Birth/Sex: [birth year] (60 y.o. Male) Treating RN: Afful, RN, BSN, [place] Sink Primary Care Physician: Joen Laura Other Clinician: Referring Physician: Joen Laura Treating Physician/Extender: [name] Weeks in Treatment: 3 Wound Status Wound Number: 2 Primary Diabetic Wound/Ulcer of the Lower Etiology: Extremity Wound Location: Left Foot - Plantar Wound Open Wounding Event: Gradually Appeared Status: Date Acquired: 12/21/2014 Comorbid Anemia, Chronic Obstructive Pulmonary Weeks Of Treatment: 3 History: Disease (COPD), Arrhythmia, Clustered Wound: No Congestive Heart Failure, Coronary Artery Disease, Hypertension, Type II Diabetes, Neuropathy Wound Measurements Length: (cm) 1.3 Width: (cm) 1 Depth: (cm) 0.2 Area: (cm) 1.021 Volume: (cm) 0.204 % Reduction in Area: -30.1% % Reduction in Volume: 13.6% Epithelialization: None Tunneling: No Undermining: No Wound Description Classification: Grade 1 Wound Margin: Distinct, outline attached Exudate Amount: Small Exudate Type: Serosanguineous Exudate Color: red, brown Foul Odor After Cleansing: No Wound Bed Granulation Amount: Medium (34-66%) Exposed Structure Granulation Quality: Pink Fascia Exposed: No Necrotic Amount: Small (1-33%) Fat Layer Exposed: No Necrotic Quality: Adherent Slough Tendon Exposed: No Muscle Exposed: No Joint Exposed: No Bone Exposed: No Limited to Skin  Breakdown Periwound Skin Texture Texture Color No Abnormalities Noted: No No Abnormalities Noted: No Callus: Yes Atrophie Blanche: No Frary, Kayo D. ([id number]) Crepitus: No Cyanosis: No Excoriation: No Ecchymosis: No Fluctuance: No Erythema: No Friable: No Hemosiderin Staining: No Induration: No Mottled: No Localized Edema: No Pallor: No Rash: No Rubor: No Scarring: No Temperature / Pain Moisture Temperature: No Abnormality No Abnormalities Noted: No Dry / Scaly: No Maceration: Yes Moist: Yes Wound Preparation Ulcer Cleansing: Rinsed/Irrigated with Saline Topical Anesthetic Applied: Other: lidocaine 4%, Treatment Notes Wound #2 (Left, Plantar Foot) 1. Cleansed with: Clean wound with Normal Saline 4. Dressing Applied: Prisma Ag 5. Secondary Dressing Applied Bordered Foam Dressing 6. Footwear/Offloading device applied Other footwear/offloading device applied (specify in notes) Notes [place] with Peg Assist Electronic Signature(s) Signed: 01/18/2016 4:45:50 PM By: Elpidio Eric BSN, RN Entered By: Elpidio Eric on 01/18/2016 13:46:12  ALDEN, BENSINGER ([id number]) -------------------------------------------------------------------------------- Vitals Details Patient Name: Panas, Kaysan D. Date of Service: 01/18/2016 1:30 PM Medical Record Number: [id number] Patient Account Number: [id number] Date of Birth/Sex: November 20, 1955 (60 y.o. Male) Treating RN: Afful, RN, BSN, [place] Sink Primary Care Physician: Joen Laura Other Clinician: Referring Physician: Joen Laura Treating Physician/Extender: Altamese  in Treatment: 3 Vital Signs Time Taken: 13:40 Temperature (F): 98.3 Height (in): 67 Pulse (bpm): 71 Weight (lbs): 195 Respiratory Rate (breaths/min): 17 Body Mass Index (BMI): 30.5 Blood Pressure (mmHg): 90/70 Reference Range: 80 - 120 mg / dl Electronic Signature(s) Signed: 01/18/2016 4:45:50 PM By: Elpidio Eric BSN, RN Entered By: Elpidio Eric on 01/18/2016  13:41:38

## 2016-01-20 NOTE — Progress Notes (Signed)
ARTUR, WINNINGHAM ([id number]) Visit Report for 01/18/2016 Chief Complaint Document Details Patient Name: Tyler [name] [name]. Date of Service: 01/18/2016 1:30 PM Medical Record Patient Account Number: [id number] Number: Treating RN: Clover Mealy, RN, BSN, Rita April 19, 1956 [phone] y.o. Other Clinician: Date of Birth/Sex: Male) Treating [name] Primary Care Physician: Joen Laura Physician/Extender: G Referring Physician: Tedra Senegal in Treatment: 3 Information Obtained from: Patient Chief Complaint Patient is here for review of a wound on his left first metatarsal head that is been there for the last 6 months Electronic Signature(s) Signed: 01/19/2016 4:53:46 PM By: Baltazar Najjar MD Entered By: Baltazar Najjar on 01/18/2016 13:58:12 [name] D. ([id number]) -------------------------------------------------------------------------------- Debridement Details Patient Name: [name] D. Date of Service: 01/18/2016 1:30 PM Medical Record Patient Account Number: [id number] Number: Treating RN: Clover Mealy, RN, BSN, Rita [birth year] [phone] y.o. Other Clinician: Date of Birth/Sex: Male) Treating [name] Primary Care Physician: Joen Laura Physician/Extender: G Referring Physician: Tedra Senegal in Treatment: 3 Debridement Performed for Wound #2 Left,Plantar Foot Assessment: Performed By: Physician [name], MD Debridement: Debridement Pre-procedure Yes Verification/Time Out Taken: Start Time: 13:50 Pain Control: Lidocaine 4% Topical Solution Level: Skin/Subcutaneous Tissue Total Area Debrided (L x 1.3 (cm) x 1 (cm) = 1.3 (cm) W): Tissue and other Non-Viable, Exudate, Fibrin/Slough, Subcutaneous material debrided: Instrument: Curette Bleeding: Minimum Hemostasis Achieved: Pressure End Time: 13:55 Procedural Pain: 0 Post Procedural Pain: 0 Response to Treatment: Procedure was tolerated well Post Debridement Measurements of Total  Wound Length: (cm) 1.3 Width: (cm) 1 Depth: (cm) 0.2 Volume: (cm) 0.204 Post Procedure Diagnosis Same as Pre-procedure Electronic Signature(s) Signed: 01/18/2016 4:45:50 PM By: Elpidio Eric BSN, RN Signed: 01/19/2016 4:53:46 PM By: Baltazar Najjar MD Entered By: Baltazar Najjar on 01/18/2016 13:57:35 [name], [name] ([id number]) -------------------------------------------------------------------------------- HPI Details Patient Name: [name] D. Date of Service: 01/18/2016 1:30 PM Medical Record Patient Account Number: [id number] Number: Treating RN: Clover Mealy, RN, BSN, Rita [birth year] [phone] y.o. Other Clinician: Date of Birth/Sex: Male) Treating ROBSON, [name] Primary Care Physician: Joen Laura Physician/Extender: G Referring Physician: Tedra Sen[place] in Treatment: 3 History of Present Illness HPI Description: 12/28/15. This patient is a type II diabetic on insulin. He had vascular interventions 2 weeks ago on 1/17. This included an angioplasty of the left superficial femoral and popliteal arteries as well as a percutaneous transluminal angioplasty of the left posterior tibial artery. Nevertheless he is here for a plantar wound that the patient says is been there for the last 6 months. He initially told us that he is applying Neosporin to this however he tells me also that he has been in a nursing home for the last year. I found that to be a somewhat on usual combination of fax. In any case the patient is a diabetic with diabetic PAD. 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 His ABI on the left side today was 0.62 01/11/16; we have been using Prisma to the wound areas on these first metatarsal head on the left and the small wound on his left anterior ankle. He states that the area on his left metatarsal head was not changed all week, this might account for why there is so much maceration here. In the meantime he tells me he has fallen  and hurt his shoulder x-ray is negative. He is also complaining of shortness of breath 01/18/16; the area on his left anterior lower leg/ankle is resolved. The area over his first metatarsal  head looks better. There is less maceration tissue here. Surgical debridement to remove surface slough and nonviable subcutaneous tissue however this generally appears better. Electronic Signature(s) Signed: 01/19/2016 4:53:46 PM By: Baltazar Najjar MD Entered By: Baltazar Najjar on 01/18/2016 13:59:59 Tyler [name] [name] ([id number]) -------------------------------------------------------------------------------- Physical Exam Details Patient Name: [name] D. Date of Service: 01/18/2016 1:30 PM Medical Record Patient Account Number: [id number] Number: Treating RN: Clover Mealy, RN, BSN, Rita [birth year] [phone] y.o. Other Clinician: Date of Birth/Sex: Male) Treating ROBSON, [name] Primary Care Physician: Joen Laura Physician/Extender: G Referring Physician: Tedra Sen[place] in Treatment: 3 Musculoskeletal The patient had a lot of pain in his right shoulder last week after a fall. He appears to have torn his bicep tendon. There is bruising in the anterior arm. I had asked him to get an orthopedics consult last week. Notes Wound exam; the area over the left first metatarsal head as less macerated tissue. The surface of the wound was debrided as described. Wound appears better. He has completed a course of doxycycline last week, I saw no evidence of infection and no need for further antibiotics. Electronic Signature(s) Signed: 01/19/2016 4:53:46 PM By: Baltazar Najjar MD Entered By: Baltazar Najjar on 01/18/2016 14:02:16 [name], [name] ([id number]) -------------------------------------------------------------------------------- Physician Orders Details Patient Name: [name] D. Date of Service: 01/18/2016 1:30 PM Medical Record Patient Account Number: [id number] Number: Treating RN:  Clover Mealy, RN, BSN, Rita [birth year] [phone] y.o. Other Clinician: Date of Birth/Sex: Male) Treating ROBSON, [name] Primary Care Physician: Joen Laura Physician/Extender: G Referring Physician: Tedra Sen[place] in Treatment: 3 Verbal / Phone Orders: Yes Clinician: Afful, RN, BSN, Rita Read Back and Verified: Yes Diagnosis Coding Wound Cleansing Wound #2 Left,Plantar Foot o Clean wound with Normal Saline. Anesthetic Wound #2 Left,Plantar Foot o Topical Lidocaine 4% cream applied to wound bed prior to debridement Primary Wound Dressing Wound #2 Left,Plantar Foot o Prisma Ag - double fold Secondary Dressing Wound #2 Left,Plantar Foot o Foam Dressing Change Frequency Wound #2 Left,Plantar Foot o Change dressing every other day. Follow-up Appointments Wound #2 Left,Plantar Foot o Return Appointment in 1 week. Off-Loading Wound #2 Left,Plantar Foot o Open toe surgical shoe with peg assist. Additional Orders / Instructions Wound #2 Left,Plantar Foot o Increase protein intake. o Activity as tolerated [name] HOWAT ([id number]) Electronic Signature(s) Signed: 01/18/2016 4:45:50 PM By: Elpidio Eric BSN, RN Signed: 01/19/2016 4:53:46 PM By: Baltazar Najjar MD Entered By: Elpidio Eric on 01/18/2016 13:55:15 [name] DMarland Kitchen ([id number]) -------------------------------------------------------------------------------- Problem List Details Patient Name: [name] D. Date of Service: 01/18/2016 1:30 PM Medical Record Patient Account Number: [id number] Number: Treating RN: Clover Mealy, RN, BSN, Rita [birth year] [phone] y.o. 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 ([id number]) -------------------------------------------------------------------------------- Progress Note Details Patient Name: [name] D. Date of Service: 01/18/2016 1:30 PM Medical Record Patient Account Number: [id number] Number: Treating RN: Clover Mealy, RN, BSN, Rita 11-Jan-1956 [phone] y.o. Other Clinician: Date of Birth/Sex: Male) Treating ROBSON, Kiley Primary Care Physician: BLISS,  LAURA Physician/Extender: G Referring Physician: Tedra SenegalBLISS, LAURA Weeks in Treatment: 3 Subjective Chief Complaint Information obtained from Patient Patient is here for review of a wound on his left first metatarsal head that is been there for the last 6 months History of Present Illness (HPI) 12/28/15. This patient is a type II diabetic on insulin. He had vascular interventions 2 weeks ago on 1/17. This included an angioplasty of the left superficial femoral and popliteal arteries as well as a percutaneous transluminal angioplasty of the left posterior tibial artery. Nevertheless he is here for a plantar wound that the patient says is been there for the last 6 months. He initially told us that he is applying Neosporin to this however he tells me also that he has been in a nursing home for the last year. I found that to be a somewhat on usual combination of fax. In any case the patient is a diabetic with diabetic PAD. 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 His ABI on the left side today was 0.62 01/11/16; we have been using Prisma to the wound areas on these first metatarsal head on the left and the small wound on  his left anterior ankle. He states that the area on his left metatarsal head was not changed all week, this might account for why there is so much maceration here. In the meantime he tells me he has fallen and hurt his shoulder x-ray is negative. He is also complaining of shortness of breath 01/18/16; the area on his left anterior lower leg/ankle is resolved. The area over his first metatarsal head looks better. There is less maceration tissue here. Surgical debridement to remove surface slough and nonviable subcutaneous tissue however this generally appears better. Objective Constitutional Vitals Time Taken: 1:40 PM, Height: 67 in, Weight: 195 lbs, BMI: 30.5, Temperature: 98.3 F, Pulse: 71 bpm, Respiratory Rate: 17 breaths/min, Blood Pressure: 90/70 mmHg. Musculoskeletal The patient had a lot of pain in his right shoulder last week after a fall. He appears to have torn his bicep tendon. There is bruising in the anterior arm. I had asked him to get an orthopedics consult last week. [name] D. ([id number]) General Notes: Wound exam; the area over the left first metatarsal head as less macerated tissue. The surface of the wound was debrided as described. Wound appears better. He has completed a course of doxycycline last week, I saw no evidence of infection and no need for further antibiotics. Integumentary (Hair, Skin) Wound #1 status is Healed - Epithelialized. Original cause of wound was Gradually Appeared. The wound is located on the Left,Anterior Lower Leg. The wound measures 0cm length x 0cm width x 0cm depth; 0cm^2 area and 0cm^3 volume. The wound is limited to skin breakdown. There is no tunneling or undermining noted. There is a none present amount of drainage noted. The wound margin is distinct with the outline attached to the wound base. There is no granulation within the wound bed. There is no necrotic tissue within the wound bed. The periwound skin appearance did not exhibit:  Callus, Crepitus, Excoriation, Fluctuance, Friable, Induration, Localized Edema, Rash, Scarring, Dry/Scaly, Maceration, Moist, Atrophie Blanche, Cyanosis, Ecchymosis, Hemosiderin Staining, Mottled, Pallor, Rubor, Erythema. Periwound temperature was noted as No Abnormality. Wound #2 status is Open. Original cause of wound was Gradually Appeared. The wound is located on the Left,Plantar Foot. The wound measures 1.3cm length x 1cm width x 0.2cm depth; 1.021cm^2 area and 0.204cm^3 volume. The wound  is limited to skin breakdown. There is no tunneling or undermining noted. There is a small amount of serosanguineous drainage noted. The wound margin is distinct with the outline attached to the wound base. There is medium (34-66%) pink granulation within the wound bed. There is a small (1-33%) amount of necrotic tissue within the wound bed including Adherent Slough. The periwound skin appearance exhibited: Callus, Maceration, Moist. The periwound skin appearance did not exhibit: Crepitus, Excoriation, Fluctuance, Friable, Induration, Localized Edema, Rash, Scarring, Dry/Scaly, Atrophie Blanche, Cyanosis, Ecchymosis, Hemosiderin Staining, Mottled, Pallor, Rubor, Erythema. Periwound temperature was noted as No Abnormality. Assessment Active Problems ICD-10 L97.522 - Non-pressure chronic ulcer of other part of left foot with fat layer exposed E11.621 - Type 2 diabetes mellitus with foot ulcer E11.51 - Type 2 diabetes mellitus with diabetic peripheral angiopathy without gangrene Procedures Wound #2 Wound #2 is a Diabetic Wound/Ulcer of the Lower Extremity located on the Left,Plantar Foot . There was a Skin/Subcutaneous Tissue Debridement ([id number]) debridement with total area of 1.3 sq cm performed by [name], MD. with the following instrument(s): Curette to remove Non-Viable [name] D. ([id number]) tissue/material including Exudate, Fibrin/Slough, and Subcutaneous after achieving pain  control using Lidocaine 4% Topical Solution. A time out was conducted prior to the start of the procedure. A Minimum amount of bleeding was controlled with Pressure. The procedure was tolerated well with a pain level of 0 throughout and a pain level of 0 following the procedure. Post Debridement Measurements: 1.3cm length x 1cm width x 0.2cm depth; 0.204cm^3 volume. Post procedure Diagnosis Wound #2: Same as Pre-Procedure Plan Wound Cleansing: Wound #2 Left,Plantar Foot: Clean wound with Normal Saline. Anesthetic: Wound #2 Left,Plantar Foot: Topical Lidocaine 4% cream applied to wound bed prior to debridement Primary Wound Dressing: Wound #2 Left,Plantar Foot: Prisma Ag - double fold Secondary Dressing: Wound #2 Left,Plantar Foot: Foam Dressing Change Frequency: Wound #2 Left,Plantar Foot: Change dressing every other day. Follow-up Appointments: Wound #2 Left,Plantar Foot: Return Appointment in 1 week. Off-Loading: Wound #2 Left,Plantar Foot: Open toe surgical shoe with peg assist. Additional Orders / Instructions: Wound #2 Left,Plantar Foot: Increase protein intake. Activity as tolerated #1 continue the Prisma to the left first metatarsal head, foam cover Electronic Signature(s) Tyler [name] [name] ([id number]) Signed: 01/19/2016 4:53:46 PM By: Baltazar Najjar MD Entered By: Baltazar Najjar on 01/18/2016 14:03:15 [name] D. ([id number]) -------------------------------------------------------------------------------- SuperBill Details Patient Name: [name] D. Date of Service: 01/18/2016 Medical Record Patient Account Number: [id number] Number: Treating RN: Clover Mealy, RN, BSN, Rita [birth year] [phone] y.o. Other Clinician: Date of Birth/Sex: Male) Treating ROBSON, Vishruth Primary Care Physician: Joen Laura Physician/Extender: G Referring Physician: Tedra [place] in Treatment: 3 Diagnosis Coding ICD-10 Codes Code Description [phone] Non-pressure chronic  ulcer of other part of left foot with fat layer exposed E11.621 Type 2 diabetes mellitus with foot ulcer E11.51 Type 2 diabetes mellitus with diabetic peripheral angiopathy without gangrene Facility Procedures CPT4 Code: [id number] Description: 11042 - DEB SUBQ TISSUE 20 SQ CM/< ICD-10 Description Diagnosis E11.621 Type 2 diabetes mellitus with foot ulcer Modifier: Quantity: 1 Physician Procedures CPT4 Code: [id number] Description: 11042 - WC PHYS SUBQ TISS 20 SQ CM ICD-10 Description Diagnosis E11.621 Type 2 diabetes mellitus with foot ulcer Modifier: Quantity: 1 Electronic Signature(s) Signed: 01/19/2016 4:53:46 PM By: Baltazar Najjar MD Entered By: Baltazar Najjar on 01/18/2016 14:04:12

## 2016-01-25 ENCOUNTER — Encounter: Payer: Medicare Other | Admitting: Internal Medicine

## 2016-01-25 DIAGNOSIS — E11621 Type 2 diabetes mellitus with foot ulcer: Secondary | ICD-10-CM | POA: Diagnosis not present

## 2016-01-26 NOTE — Progress Notes (Signed)
AVROHOM, MCKELVIN ([id number]) Visit Report for 01/25/2016 Arrival Information Details Patient Name: [name], [name]. Date of Service: 01/25/2016 10:45 AM Medical Record Number: [id number] Patient Account Number: [id number] Date of Birth/Sex: 03-09-1956 (60 y.o. Male) Treating RN: Clover Mealy, RN, BSN, [place] Sink Primary Care Physician: Joen Laura Other Clinician: Referring Physician: Joen Laura Treating Physician/Extender: Altamese [place] in Treatment: 4 Visit Information History Since Last Visit Added or deleted any medications: No Patient Arrived: Wheel Chair Any new allergies or adverse reactions: No Arrival Time: 10:33 Had a fall or experienced change in No activities of daily living that may affect Accompanied By: self risk of falls: Transfer Assistance: None Signs or symptoms of abuse/neglect since last No Patient Identification Verified: Yes visito Secondary Verification Process Yes Hospitalized since last visit: No Completed: Has Dressing in Place as Prescribed: Yes Patient Requires Transmission-Based No Pain Present Now: No Precautions: Patient Has Alerts: No Electronic Signature(s) Signed: 01/25/2016 3:54:41 PM By: Elpidio Eric BSN, RN Entered By: Elpidio Eric on 01/25/2016 10:36:00 [name], Tyler Algis [name] ([id number]) -------------------------------------------------------------------------------- Clinic Level of Care Assessment Details Patient Name: Tyler Milo D. Date of Service: 01/25/2016 10:45 AM Medical Record Number: [id number] Patient Account Number: [id number] Date of Birth/Sex: 12/19/1955 (60 y.o. Male) Treating RN: Afful, RN, BSN, [place] Sink Primary Care Physician: Joen Laura Other Clinician: Referring Physician: Joen Laura Treating Physician/Extender: Altamese [place] in Treatment: 4 Clinic Level of Care Assessment Items TOOL 4 Quantity Score []  - Use when only an EandM is performed on FOLLOW-UP visit 0 ASSESSMENTS - Nursing Assessment / Reassessment X -  Reassessment of Co-morbidities (includes updates in patient status) 1 10 X - Reassessment of Adherence to Treatment Plan 1 5 ASSESSMENTS - Wound and Skin Assessment / Reassessment X - Simple Wound Assessment / Reassessment - one wound 1 5 []  - Complex Wound Assessment / Reassessment - multiple wounds 0 []  - Dermatologic / Skin Assessment (not related to wound area) 0 ASSESSMENTS - Focused Assessment []  - Circumferential Edema Measurements - multi extremities 0 []  - Nutritional Assessment / Counseling / Intervention 0 []  - Lower Extremity Assessment (monofilament, tuning fork, pulses) 0 []  - Peripheral Arterial Disease Assessment (using hand held doppler) 0 ASSESSMENTS - Ostomy and/or Continence Assessment and Care []  - Incontinence Assessment and Management 0 []  - Ostomy Care Assessment and Management (repouching, etc.) 0 PROCESS - Coordination of Care X - Simple Patient / Family Education for ongoing care 1 15 []  - Complex (extensive) Patient / Family Education for ongoing care 0 []  - Staff obtains Chiropractor, Records, Test Results / Process Orders 0 []  - Staff telephones HHA, Nursing Homes / Clarify orders / etc 0 []  - Routine Transfer to another Facility (non-emergent condition) 0 [name], [name] ([id number]) []  - Routine Hospital Admission (non-emergent condition) 0 []  - New Admissions / Manufacturing engineer / Ordering NPWT, Apligraf, etc. 0 []  - Emergency Hospital Admission (emergent condition) 0 []  - Simple Discharge Coordination 0 []  - Complex (extensive) Discharge Coordination 0 PROCESS - Special Needs []  - Pediatric / Minor Patient Management 0 []  - Isolation Patient Management 0 []  - Hearing / Language / Visual special needs 0 []  - Assessment of Community assistance (transportation, D/C planning, etc.) 0 []  - Additional assistance / Altered mentation 0 []  - Support Surface(s) Assessment (bed, cushion, seat, etc.) 0 INTERVENTIONS - Wound Cleansing / Measurement []  - Simple  Wound Cleansing - one wound 0 X - Complex Wound Cleansing - multiple wounds 3 5 X - Wound Imaging (photographs - any  number of wounds) 1 5 []  - Wound Tracing (instead of photographs) 0 []  - Simple Wound Measurement - one wound 0 X - Complex Wound Measurement - multiple wounds 3 5 INTERVENTIONS - Wound Dressings X - Small Wound Dressing one or multiple wounds 3 10 []  - Medium Wound Dressing one or multiple wounds 0 []  - Large Wound Dressing one or multiple wounds 0 []  - Application of Medications - topical 0 []  - Application of Medications - injection 0 INTERVENTIONS - Miscellaneous []  - External ear exam 0 [name], [name] ([id number]) []  - Specimen Collection (cultures, biopsies, blood, body fluids, etc.) 0 []  - Specimen(s) / Culture(s) sent or taken to Lab for analysis 0 []  - Patient Transfer (multiple staff / Michiel SitesHoyer Lift / Similar devices) 0 []  - Simple Staple / Suture removal (25 or less) 0 []  - Complex Staple / Suture removal (26 or more) 0 []  - Hypo / Hyperglycemic Management (close monitor of Blood Glucose) 0 []  - Ankle / Brachial Index (ABI) - do not check if billed separately 0 X - Vital Signs 1 5 Has the patient been seen at the hospital within the last three years: Yes Total Score: 105 Level Of Care: New/Established - Level 3 Electronic Signature(s) Signed: 01/25/2016 11:32:11 AM By: Elpidio EricAfful, Rita BSN, RN Entered By: Elpidio EricAfful, Rita on 01/25/2016 11:32:10 [name], [name] ([id number]) -------------------------------------------------------------------------------- Encounter Discharge Information Details Patient Name: Tyler MiloHAM, [name] Date of Service: 01/25/2016 10:45 AM Medical Record Number: [id number] Patient Account Number: [id number] Date of Birth/Sex: 04/03/1956 [age] y.o. Male) Treating RN: Clover MealyAfful, RN, BSN, [place] Sinkita Primary Care Physician: Joen LauraBLISS, LAURA Other Clinician: Referring Physician: Joen LauraBLISS, LAURA Treating Physician/Extender: Altamese CarolinaOBSON, Ralf G Weeks in Treatment: 4 Encounter  Discharge Information Items Discharge Pain Level: 0 Discharge Condition: Stable Ambulatory Status: Wheelchair Discharge Destination: Home Transportation: Other Accompanied By: self Schedule Follow-up Appointment: No Medication Reconciliation completed and No provided to Patient/Care Kania Regnier: Clinical Summary of Care: Electronic Signature(s) Signed: 01/25/2016 11:34:29 AM By: Elpidio EricAfful, Rita BSN, RN Entered By: Elpidio EricAfful, Rita on 01/25/2016 11:34:29 [name], Tyler Marland Kitchen. ([id number]) -------------------------------------------------------------------------------- Lower Extremity Assessment Details Patient Name: [name], [name] Date of Service: 01/25/2016 10:45 AM Medical Record Number: [id number] Patient Account Number: [id number] Date of Birth/Sex: 04/03/1956 [age] y.o. Male) Treating RN: Clover MealyAfful, RN, BSN, [place] Sinkita Primary Care Physician: Joen LauraBLISS, LAURA Other Clinician: Referring Physician: Joen LauraBLISS, LAURA Treating Physician/Extender: Maxwell CaulOBSON, Justyn G Weeks in Treatment: 4 Edema Assessment Assessed: [Left: No] [Right: No] E[Left: dema] [Right: :] Calf Left: Right: Point of Measurement: 34 cm From Medial Instep cm cm Ankle Left: Right: Point of Measurement: 8 cm From Medial Instep cm cm Vascular Assessment Pulses: Posterior Tibial Dorsalis Pedis Palpable: [Left:Yes] Extremity colors, hair growth, and conditions: Extremity Color: [Left:Mottled] Hair Growth on Extremity: [Left:No] Temperature of Extremity: [Left:Warm] Capillary Refill: [Left:< 3 seconds] Toe Nail Assessment Left: Right: Thick: Yes Discolored: Yes Deformed: No Improper Length and Hygiene: Yes Electronic Signature(s) Signed: 01/25/2016 3:54:41 PM By: Elpidio EricAfful, Rita BSN, RN Entered By: Elpidio EricAfful, Rita on 01/25/2016 10:36:57 [name], [name] ([id number]) -------------------------------------------------------------------------------- Multi Wound Chart Details Patient Name: Tyler MiloHAM, Nile D. Date of Service: 01/25/2016 10:45 AM Medical  Record Number: [id number] Patient Account Number: [id number] Date of Birth/Sex: 04/03/1956 [age] y.o. Male) Treating RN: Clover MealyAfful, RN, BSN, [place] Sinkita Primary Care Physician: Joen LauraBLISS, LAURA Other Clinician: Referring Physician: Joen LauraBLISS, LAURA Treating Physician/Extender: Maxwell CaulOBSON, Nivek G Weeks in Treatment: 4 Vital Signs Height(in): 67 Pulse(bpm): 84 Weight(lbs): 195 Blood Pressure 112/79 (mmHg): Body Mass Index(BMI): 31 Temperature(F): 98.4 Respiratory Rate 18 (breaths/min): Photos: [2:No Photos] [  3:No Photos] [4:No Photos] Wound Location: [2:Left Foot - Plantar] [3:Left Lower Leg - Proximal] [4:Left Lower Leg - Medial] Wounding Event: [2:Gradually Appeared] [3:Gradually Appeared] [4:Gradually Appeared] Primary Etiology: [2:Diabetic Wound/Ulcer of the Lower Extremity] [3:Diabetic Wound/Ulcer of the Lower Extremity] [4:Diabetic Wound/Ulcer of the Lower Extremity] Comorbid History: [2:Anemia, Chronic Obstructive Pulmonary Disease (COPD), Arrhythmia, Congestive Heart Failure, Coronary Artery Disease, Hypertension, Type II Diabetes, Neuropathy] [3:Anemia, Chronic Obstructive Pulmonary Disease (COPD), Arrhythmia,  Congestive Heart Failure, Coronary Artery Disease, Hypertension, Type II Diabetes, Neuropathy] [4:Anemia, Chronic Obstructive Pulmonary Disease (COPD), Arrhythmia, Congestive Heart Failure, Coronary Artery Disease, Hypertension, Type II Diabetes,  Neuropathy] Date Acquired: [2:12/21/2014] [3:01/20/2016] [4:01/20/2016] Weeks of Treatment: [2:4] [3:0] [4:0] Wound Status: [2:Open] [3:Open] [4:Open] Measurements L x W x D 1.2x1x0.1 [3:0.4x0.4x0.2] [4:1x0.5x0.2] (cm) Area (cm) : [2:0.942] [3:0.126] [4:0.393] Volume (cm) : [2:0.094] [3:0.025] [4:0.079] % Reduction in Area: [2:-20.00%] [3:0.00%] [4:0.00%] % Reduction in Volume: 60.20% [3:0.00%] [4:0.00%] Classification: [2:Grade 1] [3:Grade 1] [4:Grade 1] Exudate Amount: [2:Small] [3:Small] [4:Small] Exudate Type: [2:Serosanguineous]  [3:Serosanguineous] [4:Serous] Exudate Color: [2:red, brown] [3:red, brown] [4:amber] Wound Margin: [2:Distinct, outline attached] [3:Distinct, outline attached] [4:Distinct, outline attached] Granulation Amount: [2:Medium (34-66%)] [3:Large (67-100%)] [4:Large (67-100%)] Granulation Quality: [2:Pink] [3:Red, Pink] [4:Red] Necrotic Amount: [2:None Present (0%)] [3:N/A] [4:None Present (0%)] Exposed Structures: Fascia: No Fascia: No Fascia: No Fat: No Fat: No Fat: No Tendon: No Tendon: No Tendon: No Muscle: No Muscle: No Muscle: No Joint: No Joint: No Joint: No Bone: No Bone: No Bone: No Limited to Skin Limited to Skin Limited to Skin Breakdown Breakdown Breakdown Epithelialization: Small (1-33%) None None Periwound Skin Texture: Callus: Yes Edema: No Edema: No Edema: No Excoriation: No Excoriation: No Excoriation: No Induration: No Induration: No Induration: No Callus: No Callus: No Crepitus: No Crepitus: No Crepitus: No Fluctuance: No Fluctuance: No Fluctuance: No Friable: No Friable: No Friable: No Rash: No Rash: No Rash: No Scarring: No Scarring: No Scarring: No Periwound Skin Maceration: Yes Moist: Yes Moist: Yes Moisture: Moist: Yes Maceration: No Maceration: No Dry/Scaly: No Dry/Scaly: No Dry/Scaly: No Periwound Skin Color: Atrophie Blanche: No Atrophie Blanche: No Atrophie Blanche: No Cyanosis: No Cyanosis: No Cyanosis: No Ecchymosis: No Ecchymosis: No Ecchymosis: No Erythema: No Erythema: No Erythema: No Hemosiderin Staining: No Hemosiderin Staining: No Hemosiderin Staining: No Mottled: No Mottled: No Mottled: No Pallor: No Pallor: No Pallor: No Rubor: No Rubor: No Rubor: No Temperature: No Abnormality No Abnormality No Abnormality Tenderness on No No No Palpation: Wound Preparation: Ulcer Cleansing: Ulcer Cleansing: Ulcer Cleansing: Rinsed/Irrigated with Rinsed/Irrigated with Rinsed/Irrigated with Saline Saline  Saline Topical Anesthetic Topical Anesthetic Topical Anesthetic Applied: Other: lidocaine Applied: None Applied: None 4% Treatment Notes Electronic Signature(s) Signed: 01/25/2016 3:54:41 PM By: Elpidio Eric BSN, RN Entered By: Elpidio Eric on 01/25/2016 10:46:54 [name], [name] ([id number]) -------------------------------------------------------------------------------- Multi-Disciplinary Care Plan Details Patient Name: Tyler Milo D. Date of Service: 01/25/2016 10:45 AM Medical Record Number: [id number] Patient Account Number: [id number] Date of Birth/Sex: May 10, 1956 (60 y.o. Male) Treating RN: Clover Mealy, RN, BSN, [place] Sink Primary Care Physician: Joen Laura Other Clinician: Referring Physician: Joen Laura Treating Physician/Extender: Altamese [place] in Treatment: 4 Active Inactive Abuse / Safety / Falls / Self Care Management Nursing Diagnoses: Impaired home maintenance Impaired physical mobility Knowledge deficit related to: safety; personal, health (wound), emergency Potential for falls Self care deficit: actual or potential Goals: Patient will remain injury free Date Initiated: 12/28/2015 Goal Status: Active Patient/caregiver will verbalize understanding of skin care regimen Date Initiated: 12/28/2015 Goal Status: Active Patient/caregiver will verbalize/demonstrate measure  taken to improve self care Date Initiated: 12/28/2015 Goal Status: Active Patient/caregiver will verbalize/demonstrate measures taken to improve the patient's personal safety Date Initiated: 12/28/2015 Goal Status: Active Patient/caregiver will verbalize/demonstrate measures taken to prevent injury and/or falls Date Initiated: 12/28/2015 Goal Status: Active Patient/caregiver will verbalize/demonstrate understanding of what to do in case of emergency Date Initiated: 12/28/2015 Goal Status: Active Interventions: Assess fall risk on admission and as needed Assess: immobility, friction, shearing,  incontinence upon admission and as needed Assess impairment of mobility on admission and as needed per policy Assess self care needs on admission and as needed Provide education on basic hygiene Mago, JOANNA HALL ([id number]) Provide education on fall prevention Provide education on personal and home safety Provide education on safe transfers Treatment Activities: Education provided on Basic Hygiene : 12/28/2015 Notes: Orientation to the Wound Care Program Nursing Diagnoses: Knowledge deficit related to the wound healing center program Goals: Patient/caregiver will verbalize understanding of the Wound Healing Center Program Date Initiated: 12/28/2015 Goal Status: Active Interventions: Provide education on orientation to the wound center Notes: Venous Leg Ulcer Nursing Diagnoses: Knowledge deficit related to disease process and management Potential for venous Insuffiency (use before diagnosis confirmed) Goals: Non-invasive venous studies are completed as ordered Date Initiated: 12/28/2015 Goal Status: Active Patient/caregiver will verbalize understanding of disease process and disease management Date Initiated: 12/28/2015 Goal Status: Active Verify adequate tissue perfusion prior to therapeutic compression application Date Initiated: 12/28/2015 Goal Status: Active Interventions: Assess peripheral edema status every visit. Provide education on venous insufficiency Notes: Paschal, Keymani D. ([id number]) Wound/Skin Impairment Nursing Diagnoses: Impaired tissue integrity Knowledge deficit related to smoking impact on wound healing Knowledge deficit related to ulceration/compromised skin integrity Goals: Patient/caregiver will verbalize understanding of skin care regimen Date Initiated: 12/28/2015 Goal Status: Active Ulcer/skin breakdown will have a volume reduction of 30% by week 4 Date Initiated: 12/28/2015 Goal Status: Active Ulcer/skin breakdown will have a volume reduction of 50%  by week 8 Date Initiated: 12/28/2015 Goal Status: Active Ulcer/skin breakdown will have a volume reduction of 80% by week 12 Date Initiated: 12/28/2015 Goal Status: Active Ulcer/skin breakdown will heal within 14 weeks Date Initiated: 12/28/2015 Goal Status: Active Interventions: Assess patient/caregiver ability to obtain necessary supplies Assess patient/caregiver ability to perform ulcer/skin care regimen upon admission and as needed Assess ulceration(s) every visit Provide education on ulcer and skin care Notes: Electronic Signature(s) Signed: 01/25/2016 3:54:41 PM By: Elpidio Eric BSN, RN Entered By: Elpidio Eric on 01/25/2016 10:46:46 Brame, Yashar D. ([id number]) -------------------------------------------------------------------------------- Pain Assessment Details Patient Name: Tyler Milo D. Date of Service: 01/25/2016 10:45 AM Medical Record Number: [id number] Patient Account Number: [id number] Date of Birth/Sex: [birth year] (60 y.o. Male) Treating RN: Clover Mealy, RN, BSN, [place] Sink Primary Care Physician: Joen Laura Other Clinician: Referring Physician: Joen Laura Treating Physician/Extender: [name] Weeks in Treatment: 4 Active Problems Location of Pain Severity and Description of Pain Patient Has Paino No Site Locations Pain Management and Medication Current Pain Management: Electronic Signature(s) Signed: 01/25/2016 3:54:41 PM By: Elpidio Eric BSN, RN Entered By: Elpidio Eric on 01/25/2016 10:36:15 Lyster, [name] ([id number]) -------------------------------------------------------------------------------- Patient/Caregiver Education Details Patient Name: Tyler Milo D. Date of Service: 01/25/2016 10:45 AM Medical Record Number: [id number] Patient Account Number: [id number] Date of Birth/Gender: [birth year] (60 y.o. Male) Treating RN: Clover Mealy, RN, BSN, [place] Sink Primary Care Physician: Joen Laura Other Clinician: Referring Physician: Joen Laura Treating  Physician/Extender: Altamese [place] in Treatment: 4 Education Assessment Education Provided To: Patient Education Topics Provided Basic Hygiene: Methods: Explain/Verbal Responses: State content  correctly Safety: Methods: Explain/Verbal Responses: State content correctly Venous: Methods: Explain/Verbal Responses: State content correctly Welcome To The W[facility] Center: Methods: Explain/Verbal Responses: State content correctly Wound/Skin Impairment: Methods: Explain/Verbal Responses: State content correctly Electronic Signature(s) Signed: 01/25/2016 11:35:05 AM By: Elpidio Eric BSN, RN Entered By: Elpidio Eric on 01/25/2016 11:35:05 [name], Adeoluwa D. ([id number]) -------------------------------------------------------------------------------- Wound Assessment Details Patient Name: Pritts, Hartley D. Date of Service: 01/25/2016 10:45 AM Medical Record Number: [id number] Patient Account Number: [id number] Date of Birth/Sex: 02-07-1956 (60 y.o. Male) Treating RN: Afful, RN, BSN, [place] Sink Primary Care Physician: Joen Laura Other Clinician: Referring Physician: Joen Laura Treating Physician/Extender: [name] Weeks in Treatment: 4 Wound Status Wound Number: 2 Primary Diabetic Wound/Ulcer of the Lower Etiology: Extremity Wound Location: Left Foot - Plantar Wound Open Wounding Event: Gradually Appeared Status: Date Acquired: 12/21/2014 Comorbid Anemia, Chronic Obstructive Pulmonary Weeks Of Treatment: 4 History: Disease (COPD), Arrhythmia, Clustered Wound: No Congestive Heart Failure, Coronary Artery Disease, Hypertension, Type II Diabetes, Neuropathy Wound Measurements Length: (cm) 1.2 Width: (cm) 1 Depth: (cm) 0.1 Area: (cm) 0.942 Volume: (cm) 0.094 % Reduction in Area: -20% % Reduction in Volume: 60.2% Epithelialization: Small (1-33%) Tunneling: No Undermining: No Wound Description Classification: Grade 1 Wound Margin: Distinct, outline  attached Exudate Amount: Small Exudate Type: Serosanguineous Exudate Color: red, brown Foul Odor After Cleansing: No Wound Bed Granulation Amount: Medium (34-66%) Exposed Structure Granulation Quality: Pink Fascia Exposed: No Necrotic Amount: None Present (0%) Fat Layer Exposed: No Tendon Exposed: No Muscle Exposed: No Joint Exposed: No Bone Exposed: No Limited to Skin Breakdown Periwound Skin Texture Texture Color No Abnormalities Noted: No No Abnormalities Noted: No Callus: Yes Atrophie Blanche: No Keasling, Jaiyden D. ([id number]) Crepitus: No Cyanosis: No Excoriation: No Ecchymosis: No Fluctuance: No Erythema: No Friable: No Hemosiderin Staining: No Induration: No Mottled: No Localized Edema: No Pallor: No Rash: No Rubor: No Scarring: No Temperature / Pain Moisture Temperature: No Abnormality No Abnormalities Noted: No Dry / Scaly: No Maceration: Yes Moist: Yes Wound Preparation Ulcer Cleansing: Rinsed/Irrigated with Saline Topical Anesthetic Applied: Other: lidocaine 4%, Treatment Notes Wound #2 (Left, Plantar Foot) 1. Cleansed with: Clean wound with Normal Saline 4. Dressing Applied: Prisma Ag 5. Secondary Dressing Applied Bordered Foam Dressing 7. Secured with Other (specify in notes) Notes coban lightly Electronic Signature(s) Signed: 01/25/2016 3:54:41 PM By: Elpidio Eric BSN, RN Entered By: Elpidio Eric on 01/25/2016 10:40:30 Biss, Robinson D. ([id number]) -------------------------------------------------------------------------------- Wound Assessment Details Patient Name: Mccallum, Dennies D. Date of Service: 01/25/2016 10:45 AM Medical Record Number: [id number] Patient Account Number: [id number] Date of Birth/Sex: 1956/02/15 (60 y.o. Male) Treating RN: Afful, RN, BSN, [place] Sink Primary Care Physician: Joen Laura Other Clinician: Referring Physician: Joen Laura Treating Physician/Extender: [name] Weeks in Treatment: 4 Wound Status Wound  Number: 3 Primary Diabetic Wound/Ulcer of the Lower Etiology: Extremity Wound Location: Left Lower Leg - Proximal Wound Open Wounding Event: Gradually Appeared Status: Date Acquired: 01/20/2016 Comorbid Anemia, Chronic Obstructive Pulmonary Weeks Of Treatment: 0 History: Disease (COPD), Arrhythmia, Clustered Wound: No Congestive Heart Failure, Coronary Artery Disease, Hypertension, Type II Diabetes, Neuropathy Wound Measurements Length: (cm) 0.4 Width: (cm) 0.4 Depth: (cm) 0.2 Area: (cm) 0.126 Volume: (cm) 0.025 % Reduction in Area: 0% % Reduction in Volume: 0% Epithelialization: None Tunneling: No Undermining: No Wound Description Classification: Grade 1 Wound Margin: Distinct, outline attached Exudate Amount: Small Exudate Type: Serosanguineous Exudate Color: red, brown Foul Odor After Cleansing: No Wound Bed Granulation Amount: Large (67-100%) Exposed Structure Granulation Quality: Red, Pink Fascia Exposed: No  Fat Layer Exposed: No Tendon Exposed: No Muscle Exposed: No Joint Exposed: No Bone Exposed: No Limited to Skin Breakdown Periwound Skin Texture Texture Color No Abnormalities Noted: No No Abnormalities Noted: No Callus: No Atrophie Blanche: No Quiocho, Andreus D. ([id number]) Crepitus: No Cyanosis: No Excoriation: No Ecchymosis: No Fluctuance: No Erythema: No Friable: No Hemosiderin Staining: No Induration: No Mottled: No Localized Edema: No Pallor: No Rash: No Rubor: No Scarring: No Temperature / Pain Moisture Temperature: No Abnormality No Abnormalities Noted: No Dry / Scaly: No Maceration: No Moist: Yes Wound Preparation Ulcer Cleansing: Rinsed/Irrigated with Saline Topical Anesthetic Applied: None Treatment Notes Wound #3 (Left, Proximal Lower Leg) 1. Cleansed with: Clean wound with Normal Saline 4. Dressing Applied: Prisma Ag 5. Secondary Dressing Applied Bordered Foam Dressing 7. Secured with Other (specify in  notes) Notes coban lightly Electronic Signature(s) Signed: 01/25/2016 3:54:41 PM By: Elpidio Eric BSN, RN Entered By: Elpidio Eric on 01/25/2016 10:46:38 Froh, Landry D. ([id number]) -------------------------------------------------------------------------------- Wound Assessment Details Patient Name: Balster, Landen D. Date of Service: 01/25/2016 10:45 AM Medical Record Number: [id number] Patient Account Number: [id number] Date of Birth/Sex: 1955-12-30 (60 y.o. Male) Treating RN: Afful, RN, BSN, [place] Sink Primary Care Physician: Joen Laura Other Clinician: Referring Physician: Joen Laura Treating Physician/Extender: [name] Weeks in Treatment: 4 Wound Status Wound Number: 4 Primary Diabetic Wound/Ulcer of the Lower Etiology: Extremity Wound Location: Left Lower Leg - Medial Wound Open Wounding Event: Gradually Appeared Status: Date Acquired: 01/20/2016 Comorbid Anemia, Chronic Obstructive Pulmonary Weeks Of Treatment: 0 History: Disease (COPD), Arrhythmia, Clustered Wound: No Congestive Heart Failure, Coronary Artery Disease, Hypertension, Type II Diabetes, Neuropathy Wound Measurements Length: (cm) 1 Width: (cm) 0.5 Depth: (cm) 0.2 Area: (cm) 0.393 Volume: (cm) 0.079 % Reduction in Area: 0% % Reduction in Volume: 0% Epithelialization: None Tunneling: No Undermining: No Wound Description Classification: Grade 1 Wound Margin: Distinct, outline attached Exudate Amount: Small Exudate Type: Serous Exudate Color: amber Foul Odor After Cleansing: No Wound Bed Granulation Amount: Large (67-100%) Exposed Structure Granulation Quality: Red Fascia Exposed: No Necrotic Amount: None Present (0%) Fat Layer Exposed: No Tendon Exposed: No Muscle Exposed: No Joint Exposed: No Bone Exposed: No Limited to Skin Breakdown Periwound Skin Texture Texture Color No Abnormalities Noted: No No Abnormalities Noted: No Callus: No Atrophie Blanche: No Feuerborn, Gardy D.  ([id number]) Crepitus: No Cyanosis: No Excoriation: No Ecchymosis: No Fluctuance: No Erythema: No Friable: No Hemosiderin Staining: No Induration: No Mottled: No Localized Edema: No Pallor: No Rash: No Rubor: No Scarring: No Temperature / Pain Moisture Temperature: No Abnormality No Abnormalities Noted: No Dry / Scaly: No Maceration: No Moist: Yes Wound Preparation Ulcer Cleansing: Rinsed/Irrigated with Saline Topical Anesthetic Applied: None Treatment Notes Wound #4 (Left, Medial Lower Leg) 1. Cleansed with: Clean wound with Normal Saline 4. Dressing Applied: Prisma Ag 5. Secondary Dressing Applied Bordered Foam Dressing 7. Secured with Other (specify in notes) Notes coban lightly Electronic Signature(s) Signed: 01/25/2016 3:54:41 PM By: Elpidio Eric BSN, RN Entered By: Elpidio Eric on 01/25/2016 10:45:28 Hammond, [name] ([id number]) -------------------------------------------------------------------------------- Vitals Details Patient Name: Tyler Milo D. Date of Service: 01/25/2016 10:45 AM Medical Record Number: [id number] Patient Account Number: [id number] Date of Birth/Sex: [birth year] (60 y.o. Male) Treating RN: Clover Mealy, RN, BSN, Rita Primary Care Physician: Joen Laura Other Clinician: Referring Physician: Joen Laura Treating Physician/Extender: Altamese [place] in Treatment: 4 Vital Signs Time Taken: 10:36 Temperature (F): 98.4 Height (in): 67 Pulse (bpm): 84 Weight (lbs): 195 Respiratory Rate (breaths/min): 18 Body Mass Index (BMI): 30.5  Blood Pressure (mmHg): 112/79 Reference Range: 80 - 120 mg / dl Electronic Signature(s) Signed: 01/25/2016 3:54:41 PM By: Elpidio Eric BSN, RN Entered By: Elpidio Eric on 01/25/2016 10:36:36

## 2016-01-26 NOTE — Progress Notes (Signed)
ODIN, MARIANI ([id number]) Visit Report for 01/25/2016 Chief Complaint Document Details Patient Name: [name], [name]. Date of Service: 01/25/2016 10:45 AM Medical Record Patient Account Number: [id number] Number: Treating RN: Tyler Mealy, RN, BSN, [name] 1956/08/08 [phone] y.o. Other Clinician: Date of Birth/Sex: Male) Treating [name], [name] Primary Care Physician: [name] Physician/Extender: G Referring Physician: Tedra [name] in Treatment: 4 Information Obtained from: Patient Chief Complaint Patient is here for review of a wound on his left first metatarsal head that is been there for the last 6 months Electronic Signature(s) Signed: 01/26/2016 8:13:25 AM By: Tyler Najjar MD Entered By: [name] on 01/25/2016 10:59:33 [name], [name] ([id number]) -------------------------------------------------------------------------------- HPI Details Patient Name: [name] D. Date of Service: 01/25/2016 10:45 AM Medical Record Patient Account Number: [id number] Number: Treating RN: Tyler Mealy, RN, BSN, [name] 1955-12-03 [phone] y.o. Other Clinician: Date of Birth/Sex: Male) Treating [name], [name] Primary Care Physician: [name] Physician/Extender: G Referring Physician: Tedra [name] in Treatment: 4 History of Present Illness HPI Description: 12/28/15. This patient is a type II diabetic on insulin. He had vascular interventions 2 weeks ago on 1/17. This included an angioplasty of the left superficial femoral and popliteal arteries as well as a percutaneous transluminal angioplasty of the left posterior tibial artery. Nevertheless he is here for a plantar wound that the patient says is been there for the last 6 months. He initially told us that he is applying Neosporin to this however he tells me also that he has been in a nursing home for the last year. I found that to be a somewhat on usual combination of fax. In any case the patient is a diabetic with  diabetic PAD. 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 His ABI on the left side today was 0.62 01/11/16; we have been using Prisma to the wound areas on these first metatarsal head on the left and the small wound on his left anterior ankle. He states that the area on his left metatarsal head was not changed all week, this might account for why there is so much maceration here. In the meantime he tells me he has fallen and hurt his shoulder x-ray is negative. He is also complaining of shortness of breath 01/18/16; the area on his left anterior lower leg/ankle is resolved. The area over his first metatarsal head looks better. There is less maceration tissue here. Surgical debridement to remove surface slough and nonviable subcutaneous tissue however this generally appears better. 01/25/16; patient arrives today with several areas which are small and superficial on the left leg. This may be from scratching which he admittedly does. The area over the left first plantar metatarsal head looks improved. There is no maceration surrounding skin looks normal. There is some epithelialization attempting to get over this wound. Electronic Signature(s) Signed: 01/26/2016 8:13:25 AM By: Tyler Najjar MD Entered By: [name] on 01/25/2016 11:01:05 [name], [name] -------------------------------------------------------------------------------- Physical Exam Details Patient Name: [name], [name] Date of Service: 01/25/2016 10:45 AM Medical Record Patient Account Number: [id number] Number: Treating RN: Tyler Mealy, RN, BSN, [name] 1956/02/23 [phone] y.o. Other Clinician: Date of Birth/Sex: Male) Treating [name], [name] Primary Care Physician: [name] Physician/Extender: G Referring Physician: Tedra [name] in Treatment: 4 Constitutional Sitting or standing Blood Pressure is within target range for patient.. . Pulse regular and within target  range for patient.Marland Kitchen Respirations regular, non-labored and within target range.. Looks somewhat short of breath. Respiratory Respiratory  effort is easy and symmetric bilaterally. Rate is normal at rest and on room air.. Bilateral breath sounds are clear and equal in all lobes with no wheezes, rales or rhonchi.. Cardiovascular Heart rhythm and rate regular, without murmur or gallop.Marland Kitchen. Has a palpable dorsalis pedis pulse. Gastrointestinal (GI) Abdomen is soft and non-distended without masses or tenderness. Bowel sounds active in all quadrants.. No liver or spleen enlargement or tenderness.. Notes Wound exam; the area over the left first metatarsal head appears stable. There is a rim of epithelialization noted debridement required. No infection. He has several superficial excoriations on his leg that he says are related to scratching, it doesn't look as though they've been specifically addressed at the facility Electronic Signature(s) Signed: 01/26/2016 8:13:25 AM By: Tyler Najjarobson, Ceylon MD Entered By: Tyler Najjarobson, [name] on 01/25/2016 11:03:18 [name], [name] ([id number]) -------------------------------------------------------------------------------- Physician Orders Details Patient Name: [name], [name] Date of Service: 01/25/2016 10:45 AM Medical Record Patient Account Number: [id number] Number: Treating RN: Tyler MealyAfful, RN, BSN, [name] 24-Apr-1956 [phone](59 y.o. Other Clinician: Date of Birth/Sex: Male) Treating [name], [name] Primary Care Physician: Tyler LauraBLISS, [name] Physician/Extender: G Referring Physician: Tedra SenegalBLISS, [name] Weeks in Treatment: 4 Verbal / Phone Orders: Yes Clinician: Afful, RN, BSN, [name] Read Back and Verified: Yes Diagnosis Coding Wound Cleansing Wound #2 Left,Plantar Foot o Clean wound with Normal Saline. Wound #3 Left,Proximal Lower Leg o Clean wound with Normal Saline. Wound #4 Left,Medial Lower Leg o Clean wound with Normal Saline. Anesthetic Wound #2 Left,Plantar  Foot o Topical Lidocaine 4% cream applied to wound bed prior to debridement Wound #3 Left,Proximal Lower Leg o Topical Lidocaine 4% cream applied to wound bed prior to debridement Wound #4 Left,Medial Lower Leg o Topical Lidocaine 4% cream applied to wound bed prior to debridement Skin Barriers/Peri-Wound Care Wound #2 Left,Plantar Foot o Skin Prep Wound #3 Left,Proximal Lower Leg o Skin Prep Wound #4 Left,Medial Lower Leg o Skin Prep Primary Wound Dressing Wound #2 Left,Plantar Foot o Prisma Ag Wound #3 Left,Proximal Lower Leg [name], [name] ([id number]) o Prisma Ag Wound #4 Left,Medial Lower Leg o Prisma Ag Secondary Dressing Wound #2 Left,Plantar Foot o Boardered Foam Dressing Wound #3 Left,Proximal Lower Leg o Boardered Foam Dressing Wound #4 Left,Medial Lower Leg o Boardered Foam Dressing Dressing Change Frequency Wound #2 Left,Plantar Foot o Change dressing every other day. Wound #3 Left,Proximal Lower Leg o Change dressing every other day. Wound #4 Left,Medial Lower Leg o Change dressing every other day. Follow-up Appointments Wound #2 Left,Plantar Foot o Return Appointment in 1 week. Wound #3 Left,Proximal Lower Leg o Return Appointment in 1 week. Wound #4 Left,Medial Lower Leg o Return Appointment in 1 week. Off-Loading Wound #2 Left,Plantar Foot o Open toe surgical shoe with peg assist. Wound #3 Left,Proximal Lower Leg o Open toe surgical shoe with peg assist. Wound #4 Left,Medial Lower Leg o Open toe surgical shoe with peg assist. Additional Orders / Instructions Wound #2 Left,Plantar Foot Jindra, Tevin D. ([id number]) o Increase protein intake. o Activity as tolerated Wound #3 Left,Proximal Lower Leg o Increase protein intake. o Activity as tolerated Wound #4 Left,Medial Lower Leg o Increase protein intake. o Activity as tolerated Electronic Signature(s) Signed: 01/25/2016 3:54:41 PM By: Elpidio EricAfful,  [name] BSN, RN Signed: 01/26/2016 8:13:25 AM By: Tyler Najjarobson, Shamarion MD Entered By: Elpidio EricAfful, [name] on 01/25/2016 10:55:17 Spiewak, [name] ([id number]) -------------------------------------------------------------------------------- Problem List Details Patient Name: [name], [name] Date of Service: 01/25/2016 10:45 AM Medical Record Patient Account Number: [id number] Number: Treating RN: Afful, RN, BSN, Islandia Sinkita  06/03/1956 (59 y.o. 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 ([id number]) -------------------------------------------------------------------------------- Progress Note Details Patient Name: [name] D. Date of Service: 01/25/2016 10:45 AM Medical Record Patient Account Number: [id number] Number: Treating RN: Tyler Mealy, RN, BSN, [name] September 29, 1956 [phone] y.o. Other Clinician: Date of Birth/Sex: Male) Treating Wlliam Grosso, Harace Primary Care Physician: [name] Physician/Extender: G Referring Physician: Tedra [name] in Treatment: 4 Subjective Chief Complaint Information obtained from Patient Patient is here for review of a wound on his left first metatarsal head that is been there for the last 6 months History of Present Illness (HPI) 12/28/15. This patient is a type II diabetic on insulin. He had vascular  interventions 2 weeks ago on 1/17. This included an angioplasty of the left superficial femoral and popliteal arteries as well as a percutaneous transluminal angioplasty of the left posterior tibial artery. Nevertheless he is here for a plantar wound that the patient says is been there for the last 6 months. He initially told us that he is applying Neosporin to this however he tells me also that he has been in a nursing home for the last year. I found that to be a somewhat on usual combination of fax. In any case the patient is a diabetic with diabetic PAD. 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 His ABI on the left side today was 0.62 01/11/16; we have been using Prisma to the wound areas on these first metatarsal head on the left and the small wound on his left anterior ankle. He states that the area on his left metatarsal head was not changed all week, this might account for why there is so much maceration here. In the meantime he tells me he has fallen and hurt his shoulder x-ray is negative. He is also complaining of shortness of breath 01/18/16; the area on his left anterior lower leg/ankle is resolved. The area over his first metatarsal head looks better. There is less maceration tissue here. Surgical debridement to remove surface slough and nonviable subcutaneous tissue however this generally appears better. 01/25/16; patient arrives today with several areas which are small and superficial on the left leg. This may be from scratching which he admittedly does. The area over the left first plantar metatarsal head looks improved. There is no maceration surrounding skin looks normal. There is some epithelialization attempting to get over this wound. Objective Constitutional Sitting or standing Blood Pressure is within target range for patient.. Pulse regular and within target range for patient.Marland Kitchen Respirations regular, non-labored and within target range..  Looks somewhat short of breath. Vitals Time Taken: 10:36 AM, Height: 67 in, Weight: 195 lbs, BMI: 30.5, Temperature: 98.4 F, Pulse: 84 Blomquist, Estell D. ([id number]) bpm, Respiratory Rate: 18 breaths/min, Blood Pressure: 112/79 mmHg. Respiratory Respiratory effort is easy and symmetric bilaterally. Rate is normal at rest and on room air.. Bilateral breath sounds are clear and equal in all lobes with no wheezes, rales or  rhonchi.. Cardiovascular Heart rhythm and rate regular, without murmur or gallop.Marland Kitchen Has a palpable dorsalis pedis pulse. Gastrointestinal (GI) Abdomen is soft and non-distended without masses or tenderness. Bowel sounds active in all quadrants.. No liver or spleen enlargement or tenderness.. General Notes: Wound exam; the area over the left first metatarsal head appears stable. There is a rim of epithelialization noted debridement required. No infection. He has several superficial excoriations on his leg that he says are related to scratching, it doesn't look as though they've been specifically addressed at the facility Integumentary (Hair, Skin) Wound #2 status is Open. Original cause of wound was Gradually Appeared. The wound is located on the Left,Plantar Foot. The wound measures 1.2cm length x 1cm width x 0.1cm depth; 0.942cm^2 area and 0.094cm^3 volume. The wound is limited to skin breakdown. There is no tunneling or undermining noted. There is a small amount of serosanguineous drainage noted. The wound margin is distinct with the outline attached to the wound base. There is medium (34-66%) pink granulation within the wound bed. There is no necrotic tissue within the wound bed. The periwound skin appearance exhibited: Callus, Maceration, Moist. The periwound skin appearance did not exhibit: Crepitus, Excoriation, Fluctuance, Friable, Induration, Localized Edema, Rash, Scarring, Dry/Scaly, Atrophie Blanche, Cyanosis, Ecchymosis, Hemosiderin Staining, Mottled, Pallor, Rubor,  Erythema. Periwound temperature was noted as No Abnormality. Wound #3 status is Open. Original cause of wound was Gradually Appeared. The wound is located on the Left,Proximal Lower Leg. The wound measures 0.4cm length x 0.4cm width x 0.2cm depth; 0.126cm^2 area and 0.025cm^3 volume. The wound is limited to skin breakdown. There is no tunneling or undermining noted. There is a small amount of serosanguineous drainage noted. The wound margin is distinct with the outline attached to the wound base. There is large (67-100%) red, pink granulation within the wound bed. The periwound skin appearance exhibited: Moist. The periwound skin appearance did not exhibit: Callus, Crepitus, Excoriation, Fluctuance, Friable, Induration, Localized Edema, Rash, Scarring, Dry/Scaly, Maceration, Atrophie Blanche, Cyanosis, Ecchymosis, Hemosiderin Staining, Mottled, Pallor, Rubor, Erythema. Periwound temperature was noted as No Abnormality. Wound #4 status is Open. Original cause of wound was Gradually Appeared. The wound is located on the Left,Medial Lower Leg. The wound measures 1cm length x 0.5cm width x 0.2cm depth; 0.393cm^2 area and 0.079cm^3 volume. The wound is limited to skin breakdown. There is no tunneling or undermining noted. There is a small amount of serous drainage noted. The wound margin is distinct with the outline attached to the wound base. There is large (67-100%) red granulation within the wound bed. There is no necrotic tissue within the wound bed. The periwound skin appearance exhibited: Moist. The periwound skin appearance did not exhibit: Callus, Crepitus, Excoriation, Fluctuance, Friable, Induration, Localized Edema, Rash, Scarring, Dry/Scaly, Maceration, Atrophie Blanche, Cyanosis, Ecchymosis, Hemosiderin Staining, Mottled, Pallor, Rubor, Erythema. Periwound temperature was noted as No Abnormality. [name], [name] ([id number]) Assessment Active Problems ICD-10 [phone] - Non-pressure  chronic ulcer of other part of left foot with fat layer exposed E11.621 - Type 2 diabetes mellitus with foot ulcer E11.51 - Type 2 diabetes mellitus with diabetic peripheral angiopathy without gangrene Plan Wound Cleansing: Wound #2 Left,Plantar Foot: Clean wound with Normal Saline. Wound #3 Left,Proximal Lower Leg: Clean wound with Normal Saline. Wound #4 Left,Medial Lower Leg: Clean wound with Normal Saline. Anesthetic: Wound #2 Left,Plantar Foot: Topical Lidocaine 4% cream applied to wound bed prior to debridement Wound #3 Left,Proximal Lower Leg: Topical Lidocaine 4% cream applied to wound bed prior to debridement Wound #  4 Left,Medial Lower Leg: Topical Lidocaine 4% cream applied to wound bed prior to debridement Skin Barriers/Peri-Wound Care: Wound #2 Left,Plantar Foot: Skin Prep Wound #3 Left,Proximal Lower Leg: Skin Prep Wound #4 Left,Medial Lower Leg: Skin Prep Primary Wound Dressing: Wound #2 Left,Plantar Foot: Prisma Ag Wound #3 Left,Proximal Lower Leg: Prisma Ag Wound #4 Left,Medial Lower Leg: Prisma Ag Secondary Dressing: Wound #2 Left,Plantar Foot: Boardered Foam Dressing Wound #3 Left,Proximal Lower Leg: [name], [name] ([id number]) Boardered Foam Dressing Wound #4 Left,Medial Lower Leg: Boardered Foam Dressing Dressing Change Frequency: Wound #2 Left,Plantar Foot: Change dressing every other day. Wound #3 Left,Proximal Lower Leg: Change dressing every other day. Wound #4 Left,Medial Lower Leg: Change dressing every other day. Follow-up Appointments: Wound #2 Left,Plantar Foot: Return Appointment in 1 week. Wound #3 Left,Proximal Lower Leg: Return Appointment in 1 week. Wound #4 Left,Medial Lower Leg: Return Appointment in 1 week. Off-Loading: Wound #2 Left,Plantar Foot: Open toe surgical shoe with peg assist. Wound #3 Left,Proximal Lower Leg: Open toe surgical shoe with peg assist. Wound #4 Left,Medial Lower Leg: Open toe surgical shoe with  peg assist. Additional Orders / Instructions: Wound #2 Left,Plantar Foot: Increase protein intake. Activity as tolerated Wound #3 Left,Proximal Lower Leg: Increase protein intake. Activity as tolerated Wound #4 Left,Medial Lower Leg: Increase protein intake. Activity as tolerated Silver collagen to all wound areas, foam, kerlix and the light Coban wrap. The latter hopefully to prevent scratching. The patient presented with shortness of breath however examination of the heart and lungs were both normal. His O2 sat was 95% respirations 20 and unlabored. There was no evidence of heart failure Pletz, COURY GRIEGER ([id number]) Electronic Signature(s) Signed: 01/26/2016 8:13:25 AM By: Tyler Najjar MD Entered By: [name] on 01/25/2016 11:05:36 Saidi, Drury D. ([id number]) -------------------------------------------------------------------------------- SuperBill Details Patient Name: [name] D. Date of Service: 01/25/2016 Medical Record Patient Account Number: [id number] Number: Treating RN: Tyler Mealy, RN, BSN, [name] 1956-05-03 [phone] y.o. Other Clinician: Date of Birth/Sex: Male) Treating Brendaly Townsel, Akili Primary Care Physician: [name] Physician/Extender: G Referring Physician: Tedra [name] in Treatment: 4 Diagnosis Coding ICD-10 Codes Code Description [phone] Non-pressure chronic ulcer of other part of left foot with fat layer exposed E11.621 Type 2 diabetes mellitus with foot ulcer E11.51 Type 2 diabetes mellitus with diabetic peripheral angiopathy without gangrene Facility Procedures CPT4 Code: [id number] Description: 99213 - WOUND CARE VISIT-LEV 3 EST PT Modifier: Quantity: 1 Physician Procedures CPT4 Code Description: [id number] 99213 - WC PHYS LEVEL 3 - EST PT ICD-10 Description Diagnosis L97.522 Non-pressure chronic ulcer of other part of left foo Modifier: t with fat laye Quantity: 1 r exposed Electronic Signature(s) Signed: 01/26/2016 8:10:18 AM By:  Elpidio Eric BSN, RN Signed: 01/26/2016 8:13:25 AM By: Tyler Najjar MD Entered By: Elpidio Eric on 01/26/2016 08:10:18

## 2016-02-01 ENCOUNTER — Encounter: Payer: Medicare Other | Admitting: Internal Medicine

## 2016-02-01 DIAGNOSIS — E11621 Type 2 diabetes mellitus with foot ulcer: Secondary | ICD-10-CM | POA: Diagnosis not present

## 2016-02-04 NOTE — Progress Notes (Signed)
ALPHONZA, TRAMELL ([id number]) Visit Report for 02/01/2016 Arrival Information Details Patient Name: [name], [name]. Date of Service: 02/01/2016 10:45 AM Medical Record Number: [id number] Patient Account Number: [id number] Date of Birth/Sex: 25-Feb-1956 (60 y.o. Male) Treating RN: Clover Mealy, RN, BSN, [place] Sink Primary Care Physician: Joen Laura Other Clinician: Referring Physician: Joen Laura Treating Physician/Extender: Altamese [place] in Treatment: 5 Visit Information History Since Last Visit Added or deleted any medications: No Patient Arrived: Wheel Chair Any new allergies or adverse reactions: No Arrival Time: 10:56 Had a fall or experienced change in No activities of daily living that may affect Accompanied By: self risk of falls: Transfer Assistance: None Signs or symptoms of abuse/neglect since last No Patient Identification Verified: Yes visito Secondary Verification Process Yes Hospitalized since last visit: No Completed: Has Dressing in Place as Prescribed: Yes Patient Requires Transmission-Based No Pain Present Now: No Precautions: Patient Has Alerts: No Electronic Signature(s) Signed: 02/01/2016 5:19:34 PM By: Elpidio Eric BSN, RN Entered By: Elpidio Eric on 02/01/2016 10:57:50 [name], [name]. ([id number]) -------------------------------------------------------------------------------- Clinic Level of Care Assessment Details Patient Name: [name]. Date of Service: 02/01/2016 10:45 AM Medical Record Number: [id number] Patient Account Number: [id number] Date of Birth/Sex: 08-05-1956 (60 y.o. Male) Treating RN: Clover Mealy, RN, BSN, [place] Sink Primary Care Physician: Joen Laura Other Clinician: Referring Physician: Joen Laura Treating Physician/Extender: Altamese [employer] in Treatment: 5 Clinic Level of Care Assessment Items TOOL 4 Quantity Score []  - Use when only an EandM is performed on FOLLOW-UP visit 0 ASSESSMENTS - Nursing Assessment / Reassessment X -  Reassessment of Co-morbidities (includes updates in patient status) 1 10 X - Reassessment of Adherence to Treatment Plan 1 5 ASSESSMENTS - Wound and Skin Assessment / Reassessment X - Simple Wound Assessment / Reassessment - one wound 1 5 []  - Complex Wound Assessment / Reassessment - multiple wounds 0 []  - Dermatologic / Skin Assessment (not related to wound area) 0 ASSESSMENTS - Focused Assessment []  - Circumferential Edema Measurements - multi extremities 0 []  - Nutritional Assessment / Counseling / Intervention 0 X - Lower Extremity Assessment (monofilament, tuning fork, pulses) 1 5 []  - Peripheral Arterial Disease Assessment (using hand held doppler) 0 ASSESSMENTS - Ostomy and/or Continence Assessment and Care []  - Incontinence Assessment and Management 0 []  - Ostomy Care Assessment and Management (repouching, etc.) 0 PROCESS - Coordination of Care X - Simple Patient / Family Education for ongoing care 1 15 []  - Complex (extensive) Patient / Family Education for ongoing care 0 []  - Staff obtains Chiropractor, Records, Test Results / Process Orders 0 []  - Staff telephones HHA, Nursing Homes / Clarify orders / etc 0 []  - Routine Transfer to another Facility (non-emergent condition) 0 Isbell, [name]. ([id number]) []  - Routine Hospital Admission (non-emergent condition) 0 []  - New Admissions / Manufacturing engineer / Ordering NPWT, Apligraf, etc. 0 []  - Emergency Hospital Admission (emergent condition) 0 []  - Simple Discharge Coordination 0 []  - Complex (extensive) Discharge Coordination 0 PROCESS - Special Needs []  - Pediatric / Minor Patient Management 0 []  - Isolation Patient Management 0 []  - Hearing / Language / Visual special needs 0 []  - Assessment of Community assistance (transportation, Frank/C planning, etc.) 0 []  - Additional assistance / Altered mentation 0 []  - Support Surface(s) Assessment (bed, cushion, seat, etc.) 0 INTERVENTIONS - Wound Cleansing / Measurement X - Simple  Wound Cleansing - one wound 1 5 []  - Complex Wound Cleansing - multiple wounds 0 []  - Wound Imaging (photographs -  any number of wounds) 0 []  - Wound Tracing (instead of photographs) 0 []  - Simple Wound Measurement - one wound 0 []  - Complex Wound Measurement - multiple wounds 0 INTERVENTIONS - Wound Dressings X - Small Wound Dressing one or multiple wounds 1 10 []  - Medium Wound Dressing one or multiple wounds 0 []  - Large Wound Dressing one or multiple wounds 0 []  - Application of Medications - topical 0 []  - Application of Medications - injection 0 INTERVENTIONS - Miscellaneous []  - External ear exam 0 [name], [name]. ([id number]) []  - Specimen Collection (cultures, biopsies, blood, body fluids, etc.) 0 []  - Specimen(s) / Culture(s) sent or taken to Lab for analysis 0 []  - Patient Transfer (multiple staff / Michiel SitesHoyer Lift / Similar devices) 0 []  - Simple Staple / Suture removal (25 or less) 0 []  - Complex Staple / Suture removal (26 or more) 0 []  - Hypo / Hyperglycemic Management (close monitor of Blood Glucose) 0 []  - Ankle / Brachial Index (ABI) - do not check if billed separately 0 X - Vital Signs 1 5 Has the patient been seen at the hospital within the last three years: Yes Total Score: 60 Level Of Care: New/Established - Level 2 Electronic Signature(s) Signed: 02/01/2016 5:19:34 PM By: Elpidio EricAfful, Rita BSN, RN Entered By: Elpidio EricAfful, Rita on 02/01/2016 11:27:28 [name], Tyler FellsMICHAEL Frank. ([id number]) -------------------------------------------------------------------------------- Encounter Discharge Information Details Patient Name: [name]. Date of Service: 02/01/2016 10:45 AM Medical Record Number: [id number] Patient Account Number: [id number] Date of Birth/Sex: [birth year] [age] y.o. Male) Treating RN: Clover MealyAfful, RN, BSN, [place] Sinkita Primary Care Physician: Joen LauraBLISS, LAURA Other Clinician: Referring Physician: Joen LauraBLISS, LAURA Treating Physician/Extender: Altamese CarolinaOBSON, Antwon G Weeks in Treatment: 5 Encounter  Discharge Information Items Discharge Pain Level: 0 Discharge Condition: Stable Ambulatory Status: Wheelchair Discharge Destination: Nursing Home Transportation: Other Accompanied By: self Schedule Follow-up Appointment: No Medication Reconciliation completed and provided to Patient/Care No Mirabel Ahlgren: Provided on Clinical Summary of Care: 02/01/2016 Form Type [facility]ip[facility] Paper [facility] Signed: 02/01/2016 11:37:16 AM By: Gwenlyn PerkingMoore, Shelia Entered By: Gwenlyn PerkingMoore, Shelia on 02/01/2016 11:37:16 [place], Jaedyn Frank. ([id number]) -------------------------------------------------------------------------------- Lower Extremity Assessment Details Patient Name: [name], [name]. Date of Service: 02/01/2016 10:45 AM Medical Record Number: [id number] Patient Account Number: [id number] Date of Birth/Sex: [birth year] 11(59 y.o. Male) Treating RN: Clover MealyAfful, RN, BSN, [place] Sinkita Primary Care Physician: Joen LauraBLISS, LAURA Other Clinician: Referring Physician: Joen LauraBLISS, LAURA Treating Physician/Extender: Maxwell CaulOBSON, Zeth G Weeks in Treatment: 5 Edema Assessment Assessed: [Left: No] [Right: No] E[Left: dema] [Right: :] Calf Left: Right: Point of Measurement: 34 cm From Medial Instep cm cm Ankle Left: Right: Point of Measurement: 8 cm From Medial Instep cm cm Vascular Assessment Pulses: Posterior Tibial Dorsalis Pedis Palpable: [Left:Yes] Extremity colors, hair growth, and conditions: Extremity Color: [Left:Mottled] Hair Growth on Extremity: [Left:No] Temperature of Extremity: [Left:Warm] Capillary Refill: [Left:< 3 seconds] Toe Nail Assessment Left: Right: Thick: Yes Discolored: Yes Deformed: No Improper Length and Hygiene: Yes Electronic Signature(s) Signed: 02/01/2016 5:19:34 PM By: Elpidio EricAfful, Rita BSN, RN Entered By: Elpidio EricAfful, Rita on 02/01/2016 11:00:17 [name], [name]. ([id number]) -------------------------------------------------------------------------------- Multi Wound Chart Details Patient  Name: Tyler MiloHAM, Nas Frank. Date of Service: 02/01/2016 10:45 AM Medical Record Number: [id number] Patient Account Number: [id number] Date of Birth/Sex: [birth year](59 y.o. Male) Treating RN: Clover MealyAfful, RN, BSN, [place] Sinkita Primary Care Physician: Joen LauraBLISS, LAURA Other Clinician: Referring Physician: Joen LauraBLISS, LAURA Treating Physician/Extender: Maxwell CaulOBSON, Alexy G Weeks in Treatment: 5 Vital Signs Height(in): 67 Pulse(bpm): 72 Weight(lbs): 195 Blood Pressure 134/70 (mmHg): Body Mass Index(BMI): 31 Temperature(F): 98  Respiratory Rate 18 (breaths/Tyler): Photos: [2:No Photos] [3:No Photos] [4:No Photos] Wound Location: [2:Left Foot - Plantar] [3:Left Lower Leg - Proximal] [4:Left Lower Leg - Medial] Wounding Event: [2:Gradually Appeared] [3:Gradually Appeared] [4:Gradually Appeared] Primary Etiology: [2:Diabetic Wound/Ulcer of the Lower Extremity] [3:Diabetic Wound/Ulcer of the Lower Extremity] [4:Diabetic Wound/Ulcer of the Lower Extremity] Comorbid History: [2:Anemia, Chronic Obstructive Pulmonary Disease (COPD), Arrhythmia, Congestive Heart Failure, Coronary Artery Disease, Hypertension, Type II Diabetes, Neuropathy] [3:Anemia, Chronic Obstructive Pulmonary Disease (COPD), Arrhythmia,  Congestive Heart Failure, Coronary Artery Disease, Hypertension, Type II Diabetes, Neuropathy] [4:Anemia, Chronic Obstructive Pulmonary Disease (COPD), Arrhythmia, Congestive Heart Failure, Coronary Artery Disease, Hypertension, Type II Diabetes,  Neuropathy] Date Acquired: [2:12/21/2014] [3:01/20/2016] [4:01/20/2016] Weeks of Treatment: [2:5] [3:1] [4:1] Wound Status: [2:Open] [3:Healed - Epithelialized] [4:Healed - Epithelialized] Measurements L x W x Frank 1x1x0.1 [3:0x0x0] [4:0x0x0] (cm) Area (cm) : [2:0.785] [3:0] [4:0] Volume (cm) : [2:0.079] [3:0] [4:0] % Reduction in Area: [2:0.00%] [3:100.00%] [4:100.00%] % Reduction in Volume: 66.50% [3:100.00%] [4:100.00%] Classification: [2:Grade 1] [3:Grade 1] [4:Grade 1] Exudate  Amount: [2:Small] [3:None Present] [4:None Present] Exudate Type: [2:Serosanguineous] [3:N/A] [4:N/A] Exudate Color: [2:red, brown] [3:N/A] [4:N/A] Wound Margin: [2:Distinct, outline attached] [3:Distinct, outline attached] [4:Distinct, outline attached] Granulation Amount: [2:Medium (34-66%)] [3:None Present (0%)] [4:None Present (0%)] Granulation Quality: [2:Pink] [3:N/A] [4:N/A] Necrotic Amount: [2:None Present (0%)] [3:None Present (0%)] [4:None Present (0%)] Exposed Structures: Fascia: No Fascia: No Fascia: No Fat: No Fat: No Fat: No Tendon: No Tendon: No Tendon: No Muscle: No Muscle: No Muscle: No Joint: No Joint: No Joint: No Bone: No Bone: No Bone: No Limited to Skin Limited to Skin Limited to Skin Breakdown Breakdown Breakdown Epithelialization: Medium (34-66%) Large (67-100%) Large (67-100%) Periwound Skin Texture: Callus: Yes Edema: No Edema: No Edema: No Excoriation: No Excoriation: No Excoriation: No Induration: No Induration: No Induration: No Callus: No Callus: No Crepitus: No Crepitus: No Crepitus: No Fluctuance: No Fluctuance: No Fluctuance: No Friable: No Friable: No Friable: No Rash: No Rash: No Rash: No Scarring: No Scarring: No Scarring: No Periwound Skin Maceration: Yes Dry/Scaly: Yes Dry/Scaly: Yes Moisture: Moist: Yes Maceration: No Maceration: No Dry/Scaly: No Moist: No Moist: No Periwound Skin Color: Atrophie Blanche: No Atrophie Blanche: No Atrophie Blanche: No Cyanosis: No Cyanosis: No Cyanosis: No Ecchymosis: No Ecchymosis: No Ecchymosis: No Erythema: No Erythema: No Erythema: No Hemosiderin Staining: No Hemosiderin Staining: No Hemosiderin Staining: No Mottled: No Mottled: No Mottled: No Pallor: No Pallor: No Pallor: No Rubor: No Rubor: No Rubor: No Temperature: No Abnormality No Abnormality No Abnormality Tenderness on No No No Palpation: Wound Preparation: Ulcer Cleansing: Ulcer Cleansing: Ulcer  Cleansing: Rinsed/Irrigated with Rinsed/Irrigated with Rinsed/Irrigated with Saline Saline Saline Topical Anesthetic Topical Anesthetic Topical Anesthetic Applied: Other: lidocaine Applied: None Applied: None 4% Treatment Notes Electronic Signature(s) Signed: 02/01/2016 5:19:34 PM By: Elpidio Eric BSN, RN Entered By: Elpidio Eric on 02/01/2016 11:26:25 [name], [name] ([id number]) -------------------------------------------------------------------------------- Multi-Disciplinary Care Plan Details Patient Name: [name]. Date of Service: 02/01/2016 10:45 AM Medical Record Number: [id number] Patient Account Number: [id number] Date of Birth/Sex: 1956/01/01 (60 y.o. Male) Treating RN: Clover Mealy, RN, BSN, [place] Sink Primary Care Physician: Joen Laura Other Clinician: Referring Physician: Joen Laura Treating Physician/Extender: Altamese [place] in Treatment: 5 Active Inactive Abuse / Safety / Falls / Self Care Management Nursing Diagnoses: Impaired home maintenance Impaired physical mobility Knowledge deficit related to: safety; personal, health (wound), emergency Potential for falls Self care deficit: actual or potential Goals: Patient will remain injury free Date Initiated: 12/28/2015 Goal Status: Active Patient/caregiver  will verbalize understanding of skin care regimen Date Initiated: 12/28/2015 Goal Status: Active Patient/caregiver will verbalize/demonstrate measure taken to improve self care Date Initiated: 12/28/2015 Goal Status: Active Patient/caregiver will verbalize/demonstrate measures taken to improve the patient's personal safety Date Initiated: 12/28/2015 Goal Status: Active Patient/caregiver will verbalize/demonstrate measures taken to prevent injury and/or falls Date Initiated: 12/28/2015 Goal Status: Active Patient/caregiver will verbalize/demonstrate understanding of what to do in case of emergency Date Initiated: 12/28/2015 Goal Status:  Active Interventions: Assess fall risk on admission and as needed Assess: immobility, friction, shearing, incontinence upon admission and as needed Assess impairment of mobility on admission and as needed per policy Assess self care needs on admission and as needed Provide education on basic hygiene Reigle, HENRICK MCGUE ([id number]) Provide education on fall prevention Provide education on personal and home safety Provide education on safe transfers Treatment Activities: Education provided on Basic Hygiene : 12/28/2015 Notes: Orientation to the Wound Care Program Nursing Diagnoses: Knowledge deficit related to the wound healing center program Goals: Patient/caregiver will verbalize understanding of the Wound Healing Center Program Date Initiated: 12/28/2015 Goal Status: Active Interventions: Provide education on orientation to the wound center Notes: Venous Leg Ulcer Nursing Diagnoses: Knowledge deficit related to disease process and management Potential for venous Insuffiency (use before diagnosis confirmed) Goals: Non-invasive venous studies are completed as ordered Date Initiated: 12/28/2015 Goal Status: Active Patient/caregiver will verbalize understanding of disease process and disease management Date Initiated: 12/28/2015 Goal Status: Active Verify adequate tissue perfusion prior to therapeutic compression application Date Initiated: 12/28/2015 Goal Status: Active Interventions: Assess peripheral edema status every visit. Provide education on venous insufficiency Notes: [name], [name]. ([id number]) Wound/Skin Impairment Nursing Diagnoses: Impaired tissue integrity Knowledge deficit related to smoking impact on wound healing Knowledge deficit related to ulceration/compromised skin integrity Goals: Patient/caregiver will verbalize understanding of skin care regimen Date Initiated: 12/28/2015 Goal Status: Active Ulcer/skin breakdown will have a volume reduction of 30% by  week 4 Date Initiated: 12/28/2015 Goal Status: Active Ulcer/skin breakdown will have a volume reduction of 50% by week 8 Date Initiated: 12/28/2015 Goal Status: Active Ulcer/skin breakdown will have a volume reduction of 80% by week 12 Date Initiated: 12/28/2015 Goal Status: Active Ulcer/skin breakdown will heal within 14 weeks Date Initiated: 12/28/2015 Goal Status: Active Interventions: Assess patient/caregiver ability to obtain necessary supplies Assess patient/caregiver ability to perform ulcer/skin care regimen upon admission and as needed Assess ulceration(s) every visit Provide education on ulcer and skin care Notes: Electronic Signature(s) Signed: 02/01/2016 5:19:34 PM By: Elpidio Eric BSN, RN Entered By: Elpidio Eric on 02/01/2016 11:26:14 [name], [name]. ([id number]) -------------------------------------------------------------------------------- Pain Assessment Details Patient Name: [name]. Date of Service: 02/01/2016 10:45 AM Medical Record Number: [id number] Patient Account Number: [id number] Date of Birth/Sex: Nov 19, 1955 (60 y.o. Male) Treating RN: Clover Mealy, RN, BSN, [place] Sink Primary Care Physician: Joen Laura Other Clinician: Referring Physician: Joen Laura Treating Physician/Extender: [name] Weeks in Treatment: 5 Active Problems Location of Pain Severity and Description of Pain Patient Has Paino No Site Locations Pain Management and Medication Current Pain Management: Electronic Signature(s) Signed: 02/01/2016 5:19:34 PM By: Elpidio Eric BSN, RN Entered By: Elpidio Eric on 02/01/2016 10:57:59 Veltri, [name] ([id number]) -------------------------------------------------------------------------------- Patient/Caregiver Education Details Patient Name: [name]. Date of Service: 02/01/2016 10:45 AM Medical Record Number: [id number] Patient Account Number: [id number] Date of Birth/Gender: December 09, 1955 (60 y.o. Male) Treating RN: Clover Mealy, RN, BSN,  [place] Sink Primary Care Physician: Joen Laura Other Clinician: Referring Physician: Joen Laura Treating Physician/Extender: [name] Weeks in Treatment:  5 Education Assessment Education Provided To: Patient Education Topics Provided Basic Hygiene: Methods: Explain/Verbal Responses: State content correctly Safety: Methods: Explain/Verbal Responses: State content correctly Venous: Methods: Explain/Verbal Responses: State content correctly Welcome To The Wound Care Center: Methods: Explain/Verbal Responses: State content correctly Wound/Skin Impairment: Methods: Explain/Verbal Responses: State content correctly Electronic Signature(s) Signed: 02/01/2016 5:19:34 PM By: Elpidio Eric BSN, RN Entered By: Elpidio Eric on 02/01/2016 11:28:59 Dowda, Devion Frank. ([id number]) -------------------------------------------------------------------------------- Wound Assessment Details Patient Name: Mehlhaff, Volney Frank. Date of Service: 02/01/2016 10:45 AM Medical Record Number: [id number] Patient Account Number: [id number] Date of Birth/Sex: [birth year] (60 y.o. Male) Treating RN: Clover Mealy, RN, BSN, [place] Sink Primary Care Physician: Joen Laura Other Clinician: Referring Physician: Joen Laura Treating Physician/Extender: [name] Weeks in Treatment: 5 Wound Status Wound Number: 2 Primary Diabetic Wound/Ulcer of the Lower Etiology: Extremity Wound Location: Left Foot - Plantar Wound Open Wounding Event: Gradually Appeared Status: Date Acquired: 12/21/2014 Comorbid Anemia, Chronic Obstructive Pulmonary Weeks Of Treatment: 5 History: Disease (COPD), Arrhythmia, Clustered Wound: No Congestive Heart Failure, Coronary Artery Disease, Hypertension, Type II Diabetes, Neuropathy Photos Photo Uploaded By: Elpidio Eric on 02/01/2016 17:16:56 Wound Measurements Length: (cm) 1 Width: (cm) 1 Depth: (cm) 0.1 Area: (cm) 0.785 Volume: (cm) 0.079 % Reduction in Area: 0% % Reduction in  Volume: 66.5% Epithelialization: Medium (34-66%) Tunneling: No Undermining: No Wound Description Classification: Grade 1 Wound Margin: Distinct, outline attached Exudate Amount: Small Exudate Type: Serosanguineous Exudate Color: red, brown Foul Odor After Cleansing: No Wound Bed Granulation Amount: Medium (34-66%) Exposed Structure Granulation Quality: Pink Fascia Exposed: No [name], [name]. ([id number]) Necrotic Amount: None Present (0%) Fat Layer Exposed: No Tendon Exposed: No Muscle Exposed: No Joint Exposed: No Bone Exposed: No Limited to Skin Breakdown Periwound Skin Texture Texture Color No Abnormalities Noted: No No Abnormalities Noted: No Callus: Yes Atrophie Blanche: No Crepitus: No Cyanosis: No Excoriation: No Ecchymosis: No Fluctuance: No Erythema: No Friable: No Hemosiderin Staining: No Induration: No Mottled: No Localized Edema: No Pallor: No Rash: No Rubor: No Scarring: No Temperature / Pain Moisture Temperature: No Abnormality No Abnormalities Noted: No Dry / Scaly: No Maceration: Yes Moist: Yes Wound Preparation Ulcer Cleansing: Rinsed/Irrigated with Saline Topical Anesthetic Applied: Other: lidocaine 4%, Treatment Notes Wound #2 (Left, Plantar Foot) 1. Cleansed with: Clean wound with Normal Saline 4. Dressing Applied: Aquacel Ag 5. Secondary Dressing Applied Bordered Foam Dressing 6. Footwear/Offloading device applied Other footwear/offloading device applied (specify in notes) Notes [place] Electronic Signature(s) Signed: 02/01/2016 5:19:34 PM By: Elpidio Eric BSN, RN Entered By: Elpidio Eric on 02/01/2016 11:05:16 Tomaso, Owen Frank. ([id number]) Siegel, Delman Frank. ([id number]) -------------------------------------------------------------------------------- Wound Assessment Details Patient Name: Verdone, Kaige Frank. Date of Service: 02/01/2016 10:45 AM Medical Record Number: [id number] Patient Account Number: [id number] Date of Birth/Sex:  [birth year] (60 y.o. Male) Treating RN: Clover Mealy, RN, BSN,  Sink Primary Care Physician: Joen Laura Other Clinician: Referring Physician: Joen Laura Treating Physician/Extender: [name] Weeks in Treatment: 5 Wound Status Wound Number: 3 Primary Diabetic Wound/Ulcer of the Lower Etiology: Extremity Wound Location: Left Lower Leg - Proximal Wound Healed - Epithelialized Wounding Event: Gradually Appeared Status: Date Acquired: 01/20/2016 Comorbid Anemia, Chronic Obstructive Pulmonary Weeks Of Treatment: 1 History: Disease (COPD), Arrhythmia, Clustered Wound: No Congestive Heart Failure, Coronary Artery Disease, Hypertension, Type II Diabetes, Neuropathy Photos Photo Uploaded By: Elpidio Eric on 02/01/2016 17:16:56 Wound Measurements Length: (cm) 0 % Reducti Width: (cm) 0 % Reducti Depth: (cm) 0 Epithelia Area: (cm) 0 Tunnelin Volume: (cm) 0 Undermin on in Area: 100% on  in Volume: 100% lization: Large (67-100%) g: No ing: No Wound Description Classification: Grade 1 Wound Margin: Distinct, outline attached Exudate Amount: None Present Foul Odor After Cleansing: No Wound Bed Granulation Amount: None Present (0%) Exposed Structure Necrotic Amount: None Present (0%) Fascia Exposed: No Fat Layer Exposed: No Tendon Exposed: No Ting, Lieutenant Frank. ([id number]) Muscle Exposed: No Joint Exposed: No Bone Exposed: No Limited to Skin Breakdown Periwound Skin Texture Texture Color No Abnormalities Noted: No No Abnormalities Noted: No Callus: No Atrophie Blanche: No Crepitus: No Cyanosis: No Excoriation: No Ecchymosis: No Fluctuance: No Erythema: No Friable: No Hemosiderin Staining: No Induration: No Mottled: No Localized Edema: No Pallor: No Rash: No Rubor: No Scarring: No Temperature / Pain Moisture Temperature: No Abnormality No Abnormalities Noted: No Dry / Scaly: Yes Maceration: No Moist: No Wound Preparation Ulcer Cleansing: Rinsed/Irrigated  with Saline Topical Anesthetic Applied: None Electronic Signature(s) Signed: 02/01/2016 5:19:34 PM By: Elpidio Eric BSN, RN Entered By: Elpidio Eric on 02/01/2016 11:25:08 Lampton, Jymir Frank. ([id number]) -------------------------------------------------------------------------------- Wound Assessment Details Patient Name: [name], [name]. Date of Service: 02/01/2016 10:45 AM Medical Record Number: [id number] Patient Account Number: [id number] Date of Birth/Sex: 1956/08/27 (60 y.o. Male) Treating RN: Clover Mealy, RN, BSN, [place] Sink Primary Care Physician: Joen Laura Other Clinician: Referring Physician: Joen Laura Treating Physician/Extender: [name] Weeks in Treatment: 5 Wound Status Wound Number: 4 Primary Diabetic Wound/Ulcer of the Lower Etiology: Extremity Wound Location: Left Lower Leg - Medial Wound Healed - Epithelialized Wounding Event: Gradually Appeared Status: Date Acquired: 01/20/2016 Comorbid Anemia, Chronic Obstructive Pulmonary Weeks Of Treatment: 1 History: Disease (COPD), Arrhythmia, Clustered Wound: No Congestive Heart Failure, Coronary Artery Disease, Hypertension, Type II Diabetes, Neuropathy Photos Photo Uploaded By: Elpidio Eric on 02/01/2016 17:16:57 Wound Measurements Length: (cm) 0 % Reducti Width: (cm) 0 % Reducti Depth: (cm) 0 Epithelia Area: (cm) 0 Tunnelin Volume: (cm) 0 on in Area: 100% on in Volume: 100% lization: Large (67-100%) g: No Wound Description Classification: Grade 1 Wound Margin: Distinct, outline attached Exudate Amount: None Present Foul Odor After Cleansing: No Wound Bed Granulation Amount: None Present (0%) Exposed Structure Necrotic Amount: None Present (0%) Fascia Exposed: No Fat Layer Exposed: No Tendon Exposed: No Akerson, Jartavious Frank. ([id number]) Muscle Exposed: No Joint Exposed: No Bone Exposed: No Limited to Skin Breakdown Periwound Skin Texture Texture Color No Abnormalities Noted: No No Abnormalities Noted:  No Callus: No Atrophie Blanche: No Crepitus: No Cyanosis: No Excoriation: No Ecchymosis: No Fluctuance: No Erythema: No Friable: No Hemosiderin Staining: No Induration: No Mottled: No Localized Edema: No Pallor: No Rash: No Rubor: No Scarring: No Temperature / Pain Moisture Temperature: No Abnormality No Abnormalities Noted: No Dry / Scaly: Yes Maceration: No Moist: No Wound Preparation Ulcer Cleansing: Rinsed/Irrigated with Saline Topical Anesthetic Applied: None Electronic Signature(s) Signed: 02/01/2016 5:19:34 PM By: Elpidio Eric BSN, RN Entered By: Elpidio Eric on 02/01/2016 11:25:28 Derderian, [name] ([id number]) -------------------------------------------------------------------------------- Vitals Details Patient Name: [name]. Date of Service: 02/01/2016 10:45 AM Medical Record Number: [id number] Patient Account Number: [id number] Date of Birth/Sex: 1955/12/01 (60 y.o. Male) Treating RN: Afful, RN, BSN, [place] Primary Care Physician: Joen Laura Other Clinician: Referring Physician: Joen Laura Treating Physician/Extender: Altamese Scribner in Treatment: 5 Vital Signs Time Taken: 10:58 Temperature (F): 98 Height (in): 67 Pulse (bpm): 72 Weight (lbs): 195 Respiratory Rate (breaths/Tyler): 18 Body Mass Index (BMI): 30.5 Blood Pressure (mmHg): 134/70 Reference Range: 80 - 120 mg / dl Electronic Signature(s) Signed: 02/01/2016 5:19:34 PM By: Elpidio Eric BSN, RN  Entered By: Elpidio Eric on 02/01/2016 10:58:26

## 2016-02-04 NOTE — Progress Notes (Signed)
Tyler, Frank ([id number]) Visit Report for 02/01/2016 Chief Complaint Document Details Patient Name: [name], [name]. Date of Service: 02/01/2016 10:45 AM Medical Record Patient Account Number: [id number] Number: Treating RN: Clover Mealy, RN, BSN, Rita 1956/02/14 [phone] y.o. Other Clinician: Date of Birth/Sex: Male) Treating ROBSON, Marcellas Primary Care Physician: Joen Laura Physician/Extender: G Referring Physician: Tedra Senegal in Treatment: 5 Information Obtained from: Patient Chief Complaint Patient is here for review of a wound on his left first metatarsal head that is been there for the last 6 months Electronic Signature(s) Signed: 02/02/2016 4:33:06 PM By: Baltazar Najjar MD Entered By: Baltazar Najjar on 02/01/2016 12:07:40 Yutzy, Zabdiel D. ([id number]) -------------------------------------------------------------------------------- HPI Details Patient Name: Tyler Milo D. Date of Service: 02/01/2016 10:45 AM Medical Record Patient Account Number: [id number] Number: Treating RN: Clover Mealy, RN, BSN, Rita 08/19/1956 [phone] y.o. Other Clinician: Date of Birth/Sex: Male) Treating ROBSON, Prateek Primary Care Physician: Joen Laura Physician/Extender: G Referring Physician: Tedra [place] in Treatment: 5 History of Present Illness HPI Description: 12/28/15. This patient is a type II diabetic on insulin. He had vascular interventions 2 weeks ago on 1/17. This included an angioplasty of the left superficial femoral and popliteal arteries as well as a percutaneous transluminal angioplasty of the left posterior tibial artery. Nevertheless he is here for a plantar wound that the patient says is been there for the last 6 months. He initially told us that he is applying Neosporin to this however he tells me also that he has been in a nursing home for the last year. I found that to be a somewhat on usual combination of fax. In any case the patient is a diabetic with  diabetic PAD. 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 His ABI on the left side today was 0.62 01/11/16; we have been using Prisma to the wound areas on these first metatarsal head on the left and the small wound on his left anterior ankle. He states that the area on his left metatarsal head was not changed all week, this might account for why there is so much maceration here. In the meantime he tells me he has fallen and hurt his shoulder x-ray is negative. He is also complaining of shortness of breath 01/18/16; the area on his left anterior lower leg/ankle is resolved. The area over his first metatarsal head looks better. There is less maceration tissue here. Surgical debridement to remove surface slough and nonviable subcutaneous tissue however this generally appears better. 01/25/16; patient arrives today with several areas which are small and superficial on the left leg. This may be from scratching which he admittedly does. The area over the left first plantar metatarsal head looks improved. There is no maceration surrounding skin looks normal. There is some epithelialization attempting to get over this wound. 02/01/16 the areas on the left leg have closed down. The area over the first plantar metatarsal head on the left looks stable to improved although there is some maceration around this. Switch to silver alginate/Aquacel Ag Electronic Signature(s) Signed: 02/02/2016 4:33:06 PM By: Baltazar Najjar MD Entered By: Baltazar Najjar on 02/01/2016 12:08:23 Flora Lipps ([id number]) -------------------------------------------------------------------------------- Physical Exam Details Patient Name: [name], [name] Date of Service: 02/01/2016 10:45 AM Medical Record Patient Account Number: [id number] Number: Treating RN: Clover Mealy, RN, BSN, Rita Feb 08, 1956 [phone] y.o. Other Clinician: Date of Birth/Sex: Male) Treating ROBSON, Osten Primary Care  Physician: Joen Laura Physician/Extender: G Referring Physician: Tedra Sen[place] in  Treatment: 5 Constitutional Sitting or standing Blood Pressure is within target range for patient.. Supine Blood Pressure is within target range for patient.. Temperature is normal and within the target range for the patient.. Patient's appearance is neat and clean. Appears in no acute distress. Well nourished and well developed.Marland Kitchen Respiratory Respiratory effort is easy and symmetric bilaterally. Rate is normal at rest and on room air.. Bilateral breath sounds are clear and equal in all lobes with no wheezes, rales or rhonchi.. Cardiovascular Heart rhythm and rate regular, without murmur or gallop.. Pedal pulses absent bilaterally.. The patient has some degree of venous insufficiency and inflammation on the left leg he has known PAD.Marland Kitchen Lymphatic None palpable in the popliteal or inguinal areas.. Notes Wound exam; the area over the left plantar first metatarsal head appears to be improving with granulation and epithelialization progressing. He does have some maceration around the wound and apparently there has been some drainage. All wounds on the left lower leg which were probably from scratching under the wrap are healed Electronic Signature(s) Signed: 02/02/2016 4:33:06 PM By: Baltazar Najjar MD Entered By: Baltazar Najjar on 02/01/2016 12:18:57 [name] ([id number]) -------------------------------------------------------------------------------- Physician Orders Details Patient Name: Tyler Milo D. Date of Service: 02/01/2016 10:45 AM Medical Record Patient Account Number: [id number] Number: Treating RN: Clover Mealy, RN, BSN, Rita 08-02-1956 [phone] y.o. Other Clinician: Date of Birth/Sex: Male) Treating ROBSON, Fallou Primary Care Physician: Joen Laura Physician/Extender: G Referring Physician: Tedra Sen[place] in Treatment: 5 Verbal / Phone Orders: Yes Clinician: Afful, RN,  BSN, Rita Read Back and Verified: Yes Diagnosis Coding Wound Cleansing Wound #2 Left,Plantar Foot o Clean wound with Normal Saline. Anesthetic Wound #2 Left,Plantar Foot o Topical Lidocaine 4% cream applied to wound bed prior to debridement Skin Barriers/Peri-Wound Care Wound #2 Left,Plantar Foot o Skin Prep Primary Wound Dressing Wound #2 Left,Plantar Foot o Aquacel Ag Secondary Dressing Wound #2 Left,Plantar Foot o Boardered Foam Dressing Dressing Change Frequency Wound #2 Left,Plantar Foot o Change dressing every other day. Follow-up Appointments Wound #2 Left,Plantar Foot o Return Appointment in 1 week. Off-Loading Wound #2 Left,Plantar Foot o Open toe surgical shoe with peg assist. Marchese, Si D. ([id number]) Additional Orders / Instructions Wound #2 Left,Plantar Foot o Increase protein intake. o Activity as tolerated Electronic Signature(s) Signed: 02/01/2016 5:19:34 PM By: Elpidio Eric BSN, RN Signed: 02/02/2016 4:33:06 PM By: Baltazar Najjar MD Entered By: Elpidio Eric on 02/01/2016 11:27:02 Pryce, Avi DMarland Kitchen ([id number]) -------------------------------------------------------------------------------- Problem List Details Patient Name: Malkin, Chaze D. Date of Service: 02/01/2016 10:45 AM Medical Record Patient Account Number: [id number] Number: Treating RN: Clover Mealy, RN, BSN, Rita 04/27/1956 [phone] y.o. 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 Date of Service: 02/01/2016 10:45 AM Medical Record Patient Account Number: [id number] Number: Treating RN: Clover Mealy, RN, BSN, Rita August 12, 1956 [phone] y.o. Other Clinician: Date of Birth/Sex: Male) Treating ROBSON, Lecil Primary Care Physician: Joen Laura Physician/Extender: G Referring Physician: Quillian Quince,  LAURA Weeks in Treatment: 5 Subjective Chief Complaint Information obtained from Patient Patient is here for review of a wound on his left first metatarsal head that is been there for the last 6 months History of Present Illness (HPI) 12/28/15. This patient is a type II diabetic on insulin. He had vascular interventions 2 weeks ago on 1/17. This included an angioplasty of the left superficial femoral and popliteal arteries as well as a percutaneous transluminal angioplasty of the left posterior tibial artery. Nevertheless he is here for a plantar wound that the patient says is been there for the last 6 months. He initially told us that he is applying Neosporin to this however he tells me also that he has been in a nursing home for the last year. I found that to be a somewhat on usual combination of fax. In any case the patient is a diabetic with diabetic PAD. 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 His ABI on the left side today was 0.62 01/11/16; we have been using Prisma to the wound areas on these first metatarsal head on the left and the small wound on his left anterior ankle. He states that the area on his left metatarsal head was not changed all week, this might account for why there is so much maceration here. In the meantime  he tells me he has fallen and hurt his shoulder x-ray is negative. He is also complaining of shortness of breath 01/18/16; the area on his left anterior lower leg/ankle is resolved. The area over his first metatarsal head looks better. There is less maceration tissue here. Surgical debridement to remove surface slough and nonviable subcutaneous tissue however this generally appears better. 01/25/16; patient arrives today with several areas which are small and superficial on the left leg. This may be from scratching which he admittedly does. The area over the left first plantar metatarsal head looks improved. There is no maceration surrounding skin looks normal. There is some epithelialization attempting to get over this wound. 02/01/16 the areas on the left leg have closed down. The area over the first plantar metatarsal head on the left looks stable to improved although there is some maceration around this. Switch to silver alginate/Aquacel Ag Objective Constitutional Sitting or standing Blood Pressure is within target range for patient.. Supine Blood Pressure is within target Raske, Tedric D. ([id number]) range for patient.. Temperature is normal and within the target range for the patient.. Patient's appearance is neat and clean. Appears in no acute distress. Well nourished and well developed.. Vitals Time Taken: 10:58 AM, Height: 67 in, Weight: 195 lbs, BMI: 30.5, Temperature: 98 F, Pulse: 72 bpm, Respiratory Rate: 18 breaths/min, Blood Pressure: 134/70 mmHg. Respiratory Respiratory effort is easy and symmetric bilaterally. Rate is normal at rest and on room air.. Bilateral breath sounds are clear and equal in all lobes with no wheezes, rales or rhonchi.. Cardiovascular Heart rhythm and rate regular, without murmur or gallop.. Pedal pulses absent bilaterally.. The patient has some degree of venous insufficiency and inflammation on the left leg he has known PAD.Marland Kitchen Lymphatic None palpable in  the popliteal or inguinal areas.. General Notes: Wound exam; the area over the left plantar first metatarsal head appears to be improving with granulation and epithelialization progressing. He does have some maceration around the wound and apparently there has been some drainage. All wounds on the left lower leg which were probably from scratching under the wrap are healed Integumentary (Hair,  Skin) Wound #2 status is Open. Original cause of wound was Gradually Appeared. The wound is located on the Left,Plantar Foot. The wound measures 1cm length x 1cm width x 0.1cm depth; 0.785cm^2 area and 0.079cm^3 volume. The wound is limited to skin breakdown. There is no tunneling or undermining noted. There is a small amount of serosanguineous drainage noted. The wound margin is distinct with the outline attached to the wound base. There is medium (34-66%) pink granulation within the wound bed. There is no necrotic tissue within the wound bed. The periwound skin appearance exhibited: Callus, Maceration, Moist. The periwound skin appearance did not exhibit: Crepitus, Excoriation, Fluctuance, Friable, Induration, Localized Edema, Rash, Scarring, Dry/Scaly, Atrophie Blanche, Cyanosis, Ecchymosis, Hemosiderin Staining, Mottled, Pallor, Rubor, Erythema. Periwound temperature was noted as No Abnormality. Wound #3 status is Healed - Epithelialized. Original cause of wound was Gradually Appeared. The wound is located on the Left,Proximal Lower Leg. The wound measures 0cm length x 0cm width x 0cm depth; 0cm^2 area and 0cm^3 volume. The wound is limited to skin breakdown. There is no tunneling or undermining noted. There is a none present amount of drainage noted. The wound margin is distinct with the outline attached to the wound base. There is no granulation within the wound bed. There is no necrotic tissue within the wound bed. The periwound skin appearance exhibited: Dry/Scaly. The periwound skin appearance did  not exhibit: Callus, Crepitus, Excoriation, Fluctuance, Friable, Induration, Localized Edema, Rash, Scarring, Maceration, Moist, Atrophie Blanche, Cyanosis, Ecchymosis, Hemosiderin Staining, Mottled, Pallor, Rubor, Erythema. Periwound temperature was noted as No Abnormality. Wound #4 status is Healed - Epithelialized. Original cause of wound was Gradually Appeared. The wound is located on the Left,Medial Lower Leg. The wound measures 0cm length x 0cm width x 0cm depth; 0cm^2 area and 0cm^3 volume. The wound is limited to skin breakdown. There is no tunneling noted. There is a none present amount of drainage noted. The wound margin is distinct with the outline attached to the wound base. There is no granulation within the wound bed. There is no necrotic tissue within the wound bed. The periwound skin appearance exhibited: Dry/Scaly. The periwound skin appearance did not exhibit: Coke, Linwood D. ([id number]) Callus, Crepitus, Excoriation, Fluctuance, Friable, Induration, Localized Edema, Rash, Scarring, Maceration, Moist, Atrophie Blanche, Cyanosis, Ecchymosis, Hemosiderin Staining, Mottled, Pallor, Rubor, Erythema. Periwound temperature was noted as No Abnormality. Assessment Active Problems ICD-10 L97.522 - Non-pressure chronic ulcer of other part of left foot with fat layer exposed E11.621 - Type 2 diabetes mellitus with foot ulcer E11.51 - Type 2 diabetes mellitus with diabetic peripheral angiopathy without gangrene Plan Wound Cleansing: Wound #2 Left,Plantar Foot: Clean wound with Normal Saline. Anesthetic: Wound #2 Left,Plantar Foot: Topical Lidocaine 4% cream applied to wound bed prior to debridement Skin Barriers/Peri-Wound Care: Wound #2 Left,Plantar Foot: Skin Prep Primary Wound Dressing: Wound #2 Left,Plantar Foot: Aquacel Ag Secondary Dressing: Wound #2 Left,Plantar Foot: Boardered Foam Dressing Dressing Change Frequency: Wound #2 Left,Plantar Foot: Change dressing every  other day. Follow-up Appointments: Wound #2 Left,Plantar Foot: Return Appointment in 1 week. Off-Loading: Wound #2 Left,Plantar Foot: Open toe surgical shoe with peg assist. Additional Orders / Instructions: Wound #2 Left,Plantar Foot: Increase protein intake. EBUBECHUKWU, JEDLICKA ([id number]) Activity as tolerated 1 I changed the dressing to the plantar left metatarsal head wound to silver alginate to help with the drainage and maceration Foam-based dressing Electronic Signature(s) Signed: 02/02/2016 4:33:06 PM By: Baltazar Najjar MD Entered By: Baltazar Najjar on 02/01/2016 12:25:23 Salberg, Panfilo D. ([id number]) --------------------------------------------------------------------------------  SuperBill Details Patient Name: Flora LippsHAM, Gavyn D. Date of Service: 02/01/2016 Medical Record Patient Account Number: [id number] Number: Treating RN: Clover MealyAfful, RN, BSN, Rita [birth year] [phone](59 y.o. Other Clinician: Date of Birth/Sex: Male) Treating ROBSON, [place] Primary Care Physician: Joen LauraBLISS, LAURA Physician/Extender: G Referring Physician: Tedra SenegalBLISS, LAURA Weeks in Treatment: 5 Diagnosis Coding ICD-10 Codes Code Description [phone] [id number] Non-pressure chronic ulcer of other part of left foot with fat layer exposed E11.621 Type 2 diabetes mellitus with foot ulcer E11.51 Type 2 diabetes mellitus with diabetic peripheral angiopathy without gangrene Facility Procedures CPT4 Code: [id number] Description: [id number] - WOUND CARE VISIT-LEV 2 EST PT Modifier: Quantity: 1 Physician Procedures CPT4 Code: [id number] Description: 99213 - WC PHYS LEVEL 3 - EST PT ICD-10 Description Diagnosis E11.621 Type 2 diabetes mellitus with foot ulcer Modifier: Quantity: 1 Electronic Signature(s) Signed: 02/02/2016 4:33:06 PM By: Baltazar Najjarobson, Rabon MD Entered By: Baltazar Najjarobson, Montae on 02/01/2016 12:26:08

## 2016-02-08 ENCOUNTER — Encounter: Payer: Medicare Other | Admitting: Internal Medicine

## 2016-02-08 DIAGNOSIS — E11621 Type 2 diabetes mellitus with foot ulcer: Secondary | ICD-10-CM | POA: Diagnosis not present

## 2016-02-09 NOTE — Progress Notes (Signed)
DASAN, HARDMAN ([id number]) Visit Report for 02/08/2016 Chief Complaint Document Details Patient Name: [name], [name]. Date of Service: 02/08/2016 10:45 AM Medical Record Patient Account Number: [id number] Number: Treating RN: Clover Mealy, RN, BSN, Rita 15-Nov-1955 [phone] y.o. Other Clinician: Date of Birth/Sex: Male) Treating Reita Shindler, Tad Primary Care Physician: Joen Laura Physician/Extender: G Referring Physician: Tedra Sen[place] in Treatment: 6 Information Obtained from: Patient Chief Complaint Patient is here for review of a wound on his left first metatarsal head that is been there for the last 6 months Electronic Signature(s) Signed: 02/08/2016 6:33:23 PM By: Baltazar Najjar MD Entered By: Baltazar Najjar on 02/08/2016 10:58:49 Munn, [place] D. ([id number]) -------------------------------------------------------------------------------- HPI Details Patient Name: Tyler Milo D. Date of Service: 02/08/2016 10:45 AM Medical Record Patient Account Number: [id number] Number: Treating RN: Clover Mealy, RN, BSN, Rita 1956/07/05 [phone] y.o. Other Clinician: Date of Birth/Sex: Male) Treating Kerri Kovacik, Eddison Primary Care Physician: Joen Laura Physician/Extender: G Referring Physician: Tedra Sen[place] in Treatment: 6 History of Present Illness HPI Description: 12/28/15. This patient is a type II diabetic on insulin. He had vascular interventions 2 weeks ago on 1/17. This included an angioplasty of the left superficial femoral and popliteal arteries as well as a percutaneous transluminal angioplasty of the left posterior tibial artery. Nevertheless he is here for a plantar wound that the patient says is been there for the last 6 months. He initially told us that he is applying Neosporin to this however he tells me also that he has been in a nursing home for the last year. I found that to be a somewhat on usual combination of fax. In any case the patient is a diabetic with  diabetic PAD. 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 His ABI on the left side today was 0.62 01/11/16; we have been using Prisma to the wound areas on these first metatarsal head on the left and the small wound on his left anterior ankle. He states that the area on his left metatarsal head was not changed all week, this might account for why there is so much maceration here. In the meantime he tells me he has fallen and hurt his shoulder x-ray is negative. He is also complaining of shortness of breath 01/18/16; the area on his left anterior lower leg/ankle is resolved. The area over his first metatarsal head looks better. There is less maceration tissue here. Surgical debridement to remove surface slough and nonviable subcutaneous tissue however this generally appears better. 01/25/16; patient arrives today with several areas which are small and superficial on the left leg. This may be from scratching which he admittedly does. The area over the left first plantar metatarsal head looks improved. There is no maceration surrounding skin looks normal. There is some epithelialization attempting to get over this wound. 02/01/16 the areas on the left leg have closed down. The area over the first plantar metatarsal head on the left looks stable to improved although there is some maceration around this. Switch to silver alginate/Aquacel Ag 02/08/16; the areas on his legs remain closed down. The area over the first plantar metatarsal head on the left has advancing epithelialization with only a small open area remaining. Patient always look short of breath when he comes to our clinic but he states his wheezing is improved. We switched to silver alginate last week due to maceration around the wound Electronic Signature(s) Signed: 02/08/2016 6:33:23 PM By: Baltazar Najjar MD Entered By: Baltazar Najjar  on 02/08/2016 10:59:49 Rudman, Sylus D.  ([id number]) -------------------------------------------------------------------------------- Physical Exam Details Patient Name: [name], [name] Date of Service: 02/08/2016 10:45 AM Medical Record Patient Account Number: [id number] Number: Treating RN: Clover Mealy, RN, BSN, Rita 24-Apr-1956 [phone] y.o. Other Clinician: Date of Birth/Sex: Male) Treating Gerald Honea, Gavan Primary Care Physician: Joen Laura Physician/Extender: G Referring Physician: Tedra Sen[place] in Treatment: 6 Constitutional Sitting or standing Blood Pressure is within target range for patient.. Pulse regular and within target range for patient.Marland Kitchen Respirations regular, non-labored and within target range.. Temperature is normal and within the target range for the patient.. Always look short of breath. Respiratory Patient looks short of breath but his lungs are completely clear there is no wheezing or crackles.. Bilateral breath sounds are clear and equal in all lobes with no wheezes, rales or rhonchi.. Cardiovascular Heart rhythm and rate regular, without murmur or gallop.. Not palpable on the left. Not palpable on the left. No edema. Markedly prolonged Capillary refill. Gastrointestinal (GI) Abdomen is soft and non-distended without masses or tenderness. Bowel sounds active in all quadrants.. No liver or spleen enlargement or tenderness.. Lymphatic Nonpalpable in the popliteal and inguinal area. Musculoskeletal Not tested however the patient tells me that he has a prosthesis for his right BKA. Psychiatric Patient appears somewhat depressed but this is not really changed week to week. Notes 02/08/16 wound exam the area over the left plantar first metatarsal head continues to have epithelialization all open area remains. There was no macerated skin this week around the wound and no debridement was required there is no evidence of infection. There is a very poor perfusion however and he is at high risk  of recurrence Electronic Signature(s) Signed: 02/08/2016 6:33:23 PM By: Baltazar Najjar MD Entered By: Baltazar Najjar on 02/08/2016 11:04:49 [place], [name] ([id number]) -------------------------------------------------------------------------------- Physician Orders Details Patient Name: Tyler Milo D. Date of Service: 02/08/2016 10:45 AM Medical Record Patient Account Number: [id number] Number: Treating RN: Clover Mealy, RN, BSN, Rita Jan 03, 1956 [phone] y.o. Other Clinician: Date of Birth/Sex: Male) Treating Travious Vanover, Fuller Primary Care Physician: Joen Laura Physician/Extender: G Referring Physician: Tedra Senegal in Treatment: 6 Verbal / Phone Orders: Yes Clinician: Afful, RN, BSN, Rita Read Back and Verified: Yes Diagnosis Coding Wound Cleansing Wound #2 Left,Plantar Foot o Clean wound with Normal Saline. Anesthetic Wound #2 Left,Plantar Foot o Topical Lidocaine 4% cream applied to wound bed prior to debridement Skin Barriers/Peri-Wound Care Wound #2 Left,Plantar Foot o Skin Prep Primary Wound Dressing Wound #2 Left,Plantar Foot o Aquacel Ag Secondary Dressing Wound #2 Left,Plantar Foot o Boardered Foam Dressing Dressing Change Frequency Wound #2 Left,Plantar Foot o Change dressing every other day. Follow-up Appointments Wound #2 Left,Plantar Foot o Return Appointment in 1 week. Off-Loading Wound #2 Left,Plantar Foot o Open toe surgical shoe with peg assist. Delude, Johnny D. ([id number]) Additional Orders / Instructions Wound #2 Left,Plantar Foot o Increase protein intake. o Activity as tolerated Electronic Signature(s) Signed: 02/08/2016 5:36:13 PM By: Elpidio Eric BSN, RN Signed: 02/08/2016 6:33:23 PM By: Baltazar Najjar MD Entered By: Elpidio Eric on 02/08/2016 10:52:53 Ketcham, [name] ([id number]) -------------------------------------------------------------------------------- Problem List Details Patient Name: Lia, Norah D. Date  of Service: 02/08/2016 10:45 AM Medical Record Patient Account Number: [id number] Number: Treating RN: Clover Mealy, RN, BSN, Rita Apr 08, 1956 [phone] y.o. 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 ([id number]) -------------------------------------------------------------------------------- Progress Note Details Patient Name: Tyler MiloHAM, Jesson D. Date of Service: 02/08/2016 10:45 AM Medical Record Patient Account Number: [id number] Number: Treating RN: Clover MealyAfful, RN, BSN, Rita 1956-03-26 [phone](59 y.o. Other Clinician: Date of Birth/Sex: Male) Treating Briceyda Abdullah, Odin Primary Care Physician: Joen LauraBLISS, LAURA Physician/Extender: G Referring Physician: Tedra SenegalBLISS, LAURA Weeks in Treatment: 6 Subjective Chief Complaint Information obtained from Patient Patient is here for review of a wound on his left first metatarsal head that is been there for the last 6 months History of Present Illness (HPI) 12/28/15. This patient is a type II diabetic on insulin. He had vascular interventions 2 weeks ago on 1/17. This included an angioplasty of the left superficial femoral and popliteal arteries as well as a percutaneous transluminal angioplasty of the left posterior tibial artery. Nevertheless he is here for a plantar wound that the patient says is been there for  the last 6 months. He initially told us that he is applying Neosporin to this however he tells me also that he has been in a nursing home for the last year. I found that to be a somewhat on usual combination of fax. In any case the patient is a diabetic with diabetic PAD. 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 His ABI on the left side today was 0.62 01/11/16; we have been using Prisma to the wound areas on these first metatarsal head on the left and the small wound on his left anterior ankle. He states that the area on his left metatarsal head was not changed all week, this might account for why there is so much maceration here. In the meantime he tells me he has fallen and hurt his shoulder x-ray is negative. He is also complaining of shortness of breath 01/18/16; the area on his left anterior lower leg/ankle is resolved. The area over his first metatarsal head looks better. There is less maceration tissue here. Surgical debridement to remove surface slough and nonviable subcutaneous tissue however this generally appears better. 01/25/16; patient arrives today with several areas which are small and superficial on the left leg. This may be from scratching which he admittedly does. The area over the left first plantar metatarsal head looks improved. There is no maceration surrounding skin looks normal. There is some epithelialization attempting to get over this wound. 02/01/16 the areas on the left leg have closed down. The area over the first plantar metatarsal head on the left looks stable to improved although there is some maceration around this. Switch to silver alginate/Aquacel Ag 02/08/16; the areas on his legs remain closed down. The area over the first plantar metatarsal head on the left has advancing epithelialization with only a small open area remaining. Patient always look short of breath when he comes to our clinic but he states his wheezing is  improved. We switched to silver alginate last week due to maceration around the wound Padget, Mylik D. ([id number]) Objective Constitutional Sitting or standing Blood Pressure is within target range for patient.. Pulse regular and within target range for patient.Marland Kitchen. Respirations regular, non-labored and within target range.. Temperature is normal and within the  target range for the patient.. Always look short of breath. Vitals Time Taken: 10:33 AM, Height: 67 in, Weight: 195 lbs, BMI: 30.5, Temperature: 98.4 F, Pulse: 73 bpm, Respiratory Rate: 18 breaths/min, Blood Pressure: 119/68 mmHg. Respiratory Patient looks short of breath but his lungs are completely clear there is no wheezing or crackles.. Bilateral breath sounds are clear and equal in all lobes with no wheezes, rales or rhonchi.. Cardiovascular Heart rhythm and rate regular, without murmur or gallop.. Not palpable on the left. Not palpable on the left. No edema. Markedly prolonged Capillary refill. Gastrointestinal (GI) Abdomen is soft and non-distended without masses or tenderness. Bowel sounds active in all quadrants.. No liver or spleen enlargement or tenderness.. Lymphatic Nonpalpable in the popliteal and inguinal area. Musculoskeletal Not tested however the patient tells me that he has a prosthesis for his right BKA. Psychiatric Patient appears somewhat depressed but this is not really changed week to week. General Notes: 02/08/16 wound exam the area over the left plantar first metatarsal head continues to have epithelialization all open area remains. There was no macerated skin this week around the wound and no debridement was required there is no evidence of infection. There is a very poor perfusion however and he is at high risk of recurrence Integumentary (Hair, Skin) Wound #2 status is Open. Original cause of wound was Gradually Appeared. The wound is located on the Left,Plantar Foot. The wound measures 0.5cm length x  0.5cm width x 0.1cm depth; 0.196cm^2 area and 0.02cm^3 volume. The wound is limited to skin breakdown. There is no tunneling or undermining noted. There is a small amount of serosanguineous drainage noted. The wound margin is distinct with the outline attached to the wound base. There is medium (34-66%) pink granulation within the wound bed. There is no necrotic tissue within the wound bed. The periwound skin appearance exhibited: Callus, Maceration, Moist. The periwound skin appearance did not exhibit: Crepitus, Excoriation, Fluctuance, Friable, Induration, Localized Edema, Rash, Scarring, Dry/Scaly, Atrophie Blanche, Cyanosis, Ecchymosis, Hemosiderin Staining, Mottled, Pallor, Rubor, Erythema. Periwound temperature was noted as No Abnormality. NATHANIE, OTTLEY ([id number]) Assessment Active Problems ICD-10 [phone] - Non-pressure chronic ulcer of other part of left foot with fat layer exposed E11.621 - Type 2 diabetes mellitus with foot ulcer E11.51 - Type 2 diabetes mellitus with diabetic peripheral angiopathy without gangrene Plan Wound Cleansing: Wound #2 Left,Plantar Foot: Clean wound with Normal Saline. Anesthetic: Wound #2 Left,Plantar Foot: Topical Lidocaine 4% cream applied to wound bed prior to debridement Skin Barriers/Peri-Wound Care: Wound #2 Left,Plantar Foot: Skin Prep Primary Wound Dressing: Wound #2 Left,Plantar Foot: Aquacel Ag Secondary Dressing: Wound #2 Left,Plantar Foot: Boardered Foam Dressing Dressing Change Frequency: Wound #2 Left,Plantar Foot: Change dressing every other day. Follow-up Appointments: Wound #2 Left,Plantar Foot: Return Appointment in 1 week. Off-Loading: Wound #2 Left,Plantar Foot: Open toe surgical shoe with peg assist. Additional Orders / Instructions: Wound #2 Left,Plantar Foot: Increase protein intake. Activity as tolerated Depass, Sherril D. ([id number]) 1 we will continue with the Aquacel Ag with border foam which seems to control  the maceration around the wound 2 the patient came to [place] I think from the vascular surgeons he has severe PAD 3 if he is going to attempt to ambulate with her prosthesis he will need custom shoes and a gait analysis. I have written a note to his skilled facility about this. I'm not really certain how realistic it is that he will actually ambulate 4Date of his appearance of COPD /shortness of breath his lungs  actually sound completely clear. R Electronic Signature(s) Signed: 02/08/2016 6:33:23 PM By: Baltazar Najjar MD Entered By: Baltazar Najjar on 02/08/2016 11:07:47 [place], [name] ([id number]) -------------------------------------------------------------------------------- SuperBill Details Patient Name: Tyler Milo D. Date of Service: 02/08/2016 Medical Record Patient Account Number: [id number] Number: Treating RN: Clover Mealy, RN, BSN, Rita [birth year] [phone] y.o. Other Clinician: Date of Birth/Sex: Male) Treating Koree Schopf, Davonte Primary Care Physician: Joen Laura Physician/Extender: G Referring Physician: Tedra Sen[place] in Treatment: 6 Diagnosis Coding ICD-10 Codes Code Description [phone] Non-pressure chronic ulcer of other part of left foot with fat layer exposed E11.621 Type 2 diabetes mellitus with foot ulcer E11.51 Type 2 diabetes mellitus with diabetic peripheral angiopathy without gangrene Facility Procedures CPT4 Code: [id number] Description: [phone] - WOUND CARE VISIT-LEV 2 EST PT Modifier: Quantity: 1 Physician Procedures CPT4 Code: [id number] Description: 99214 - WC PHYS LEVEL 4 - EST PT ICD-10 Description Diagnosis E11.621 Type 2 diabetes mellitus with foot ulcer Modifier: Quantity: 1 Electronic Signature(s) Signed: 02/08/2016 6:33:23 PM By: Baltazar Najjar MD Entered By: Baltazar Najjar on 02/08/2016 11:07:05

## 2016-02-09 NOTE — Progress Notes (Signed)
[name], [name] ([id number]) Visit Report for 02/08/2016 Arrival Information Details Patient Name: [name], [name]. Date of Service: 02/08/2016 10:45 AM Medical Record Number: [id number] Patient Account Number: [id number] Date of Birth/Sex: 1956/02/03 (60 y.o. Male) Treating RN: Clover Mealy, RN, BSN, [place] Sink Primary Care Physician: Joen Laura Other Clinician: Referring Physician: Joen Laura Treating Physician/Extender: Altamese [place] in Treatment: 6 Visit Information History Since Last Visit Added or deleted any medications: No Patient Arrived: Wheel Chair Any new allergies or adverse reactions: No Arrival Time: 10:30 Had a fall or experienced change in No activities of daily living that may affect Accompanied By: sef risk of falls: Transfer Assistance: None Signs or symptoms of abuse/neglect since last No Patient Identification Verified: Yes visito Secondary Verification Process Yes Hospitalized since last visit: No Completed: Has Dressing in Place as Prescribed: Yes Patient Requires Transmission-Based No Pain Present Now: No Precautions: Patient Has Alerts: No Electronic Signature(s) Signed: 02/08/2016 5:36:13 PM By: Elpidio Eric BSN, RN Entered By: Elpidio Eric on 02/08/2016 10:30:59 Creer, Tyler DMarland Kitchen ([id number]) -------------------------------------------------------------------------------- Clinic Level of Care Assessment Details Patient Name: Tyler Milo Frank. Date of Service: 02/08/2016 10:45 AM Medical Record Number: [id number] Patient Account Number: [id number] Date of Birth/Sex: 10/31/1956 (60 y.o. Male) Treating RN: Clover Mealy, RN, BSN, [place] Sink Primary Care Physician: Joen Laura Other Clinician: Referring Physician: Joen Laura Treating Physician/Extender: Altamese  in Treatment: 6 Clinic Level of Care Assessment Items TOOL 4 Quantity Score  - Use when only an EandM is performed on FOLLOW-UP visit 0 ASSESSMENTS - Nursing Assessment / Reassessment X -  Reassessment of Co-morbidities (includes updates in patient status) 1 10 X - Reassessment of Adherence to Treatment Plan 1 5 ASSESSMENTS - Wound and Skin Assessment / Reassessment X - Simple Wound Assessment / Reassessment - one wound 1 5  - Complex Wound Assessment / Reassessment - multiple wounds 0  - Dermatologic / Skin Assessment (not related to wound area) 0 ASSESSMENTS - Focused Assessment  - Circumferential Edema Measurements - multi extremities 0  - Nutritional Assessment / Counseling / Intervention 0  - Lower Extremity Assessment (monofilament, tuning fork, pulses) 0  - Peripheral Arterial Disease Assessment (using hand held doppler) 0 ASSESSMENTS - Ostomy and/or Continence Assessment and Care  - Incontinence Assessment and Management 0  - Ostomy Care Assessment and Management (repouching, etc.) 0 PROCESS - Coordination of Care X - Simple Patient / Family Education for ongoing care 1 15  - Complex (extensive) Patient / Family Education for ongoing care 0  - Staff obtains Chiropractor, Records, Test Results / Process Orders 0  - Staff telephones HHA, Nursing Homes / Clarify orders / etc 0  - Routine Transfer to another Facility (non-emergent condition) 0 Fugate, [name]. ([id number])  - Routine Hospital Admission (non-emergent condition) 0  - New Admissions / Manufacturing engineer / Ordering NPWT, Apligraf, etc. 0  - Emergency Hospital Admission (emergent condition) 0  - Simple Discharge Coordination 0  - Complex (extensive) Discharge Coordination 0 PROCESS - Special Needs  - Pediatric / Minor Patient Management 0  - Isolation Patient Management 0  - Hearing / Language / Visual special needs 0  - Assessment of Community assistance (transportation, Frank/C planning, etc.) 0  - Additional assistance / Altered mentation 0  - Support Surface(s) Assessment (bed, cushion, seat, etc.) 0 INTERVENTIONS - Wound Cleansing / Measurement X - Simple  Wound Cleansing - one wound 1 5  - Complex Wound Cleansing - multiple wounds 0 X - Wound Imaging (photographs - any  number of wounds) 1 5 []  - Wound Tracing (instead of photographs) 0 X - Simple Wound Measurement - one wound 1 5 []  - Complex Wound Measurement - multiple wounds 0 INTERVENTIONS - Wound Dressings X - Small Wound Dressing one or multiple wounds 1 10 []  - Medium Wound Dressing one or multiple wounds 0 []  - Large Wound Dressing one or multiple wounds 0 []  - Application of Medications - topical 0 []  - Application of Medications - injection 0 INTERVENTIONS - Miscellaneous []  - External ear exam 0 Kosch, [name]. ([id number]) []  - Specimen Collection (cultures, biopsies, blood, body fluids, etc.) 0 []  - Specimen(s) / Culture(s) sent or taken to Lab for analysis 0 []  - Patient Transfer (multiple staff / Michiel SitesHoyer Lift / Similar devices) 0 []  - Simple Staple / Suture removal (25 or less) 0 []  - Complex Staple / Suture removal (26 or more) 0 []  - Hypo / Hyperglycemic Management (close monitor of Blood Glucose) 0 []  - Ankle / Brachial Index (ABI) - do not check if billed separately 0 X - Vital Signs 1 5 Has the patient been seen at the hospital within the last three years: Yes Total Score: 65 Level Of Care: New/Established - Level 2 Electronic Signature(s) Signed: 02/08/2016 5:36:13 PM By: Elpidio EricAfful, Rita BSN, RN Entered By: Elpidio EricAfful, Rita on 02/08/2016 10:53:49 Cooperman, Catarino Algis Downs. ([id number]) -------------------------------------------------------------------------------- Encounter Discharge Information Details Patient Name: Tyler MiloHAM, [name]. Date of Service: 02/08/2016 10:45 AM Medical Record Number: [id number] Patient Account Number: [id number] Date of Birth/Sex: 11-09-1956 [age] y.o. Male) Treating RN: Clover MealyAfful, RN, BSN, [place] Sinkita Primary Care Physician: Joen LauraBLISS, LAURA Other Clinician: Referring Physician: Joen LauraBLISS, LAURA Treating Physician/Extender: Altamese CarolinaOBSON, Jekhi G Weeks in Treatment: 6 Encounter  Discharge Information Items Discharge Pain Level: 0 Discharge Condition: Stable Ambulatory Status: Wheelchair Discharge Destination: Nursing Home Transportation: Other Accompanied By: self Schedule Follow-up Appointment: No Medication Reconciliation completed and provided to Patient/Care No Gerrald Basu: Provided on Clinical Summary of Care: 02/08/2016 Form Type Recipient Paper Patient [facility] Electronic Signature(s) Signed: 02/08/2016 11:01:35 AM By: Gwenlyn PerkingMoore, Shelia Entered By: Gwenlyn PerkingMoore, Shelia on 02/08/2016 11:01:35 Edman, Kvon Frank. ([id number]) -------------------------------------------------------------------------------- Lower Extremity Assessment Details Patient Name: Renteria, Danzel Frank. Date of Service: 02/08/2016 10:45 AM Medical Record Number: [id number] Patient Account Number: [id number] Date of Birth/Sex: 11-09-1956 [age] y.o. Male) Treating RN: Clover MealyAfful, RN, BSN, [place] Sinkita Primary Care Physician: Joen LauraBLISS, LAURA Other Clinician: Referring Physician: Joen LauraBLISS, LAURA Treating Physician/Extender: Maxwell CaulOBSON, Telford G Weeks in Treatment: 6 Edema Assessment Assessed: [Left: No] [Right: No] E[Left: dema] [Right: :] Calf Left: Right: Point of Measurement: 34 cm From Medial Instep cm cm Ankle Left: Right: Point of Measurement: 8 cm From Medial Instep cm cm Vascular Assessment Pulses: Posterior Tibial Dorsalis Pedis Palpable: [Left:Yes] Extremity colors, hair growth, and conditions: Extremity Color: [Left:Mottled] Hair Growth on Extremity: [Left:No] Temperature of Extremity: [Left:Warm] Capillary Refill: [Left:< 3 seconds] Toe Nail Assessment Left: Right: Thick: Yes Discolored: Yes Deformed: No Improper Length and Hygiene: Yes Electronic Signature(s) Signed: 02/08/2016 5:36:13 PM By: Elpidio EricAfful, Rita BSN, RN Entered By: Elpidio EricAfful, Rita on 02/08/2016 10:51:50 Dissinger, [name]. ([id number]) -------------------------------------------------------------------------------- Multi Wound Chart Details Patient  Name: Tyler MiloHAM, [name]. Date of Service: 02/08/2016 10:45 AM Medical Record Number: [id number] Patient Account Number: [id number] Date of Birth/Sex: 11-09-1956 [age] y.o. Male) Treating RN: Clover MealyAfful, RN, BSN, [place] Sinkita Primary Care Physician: Joen LauraBLISS, LAURA Other Clinician: Referring Physician: Joen LauraBLISS, LAURA Treating Physician/Extender: Maxwell CaulOBSON, Farhan G Weeks in Treatment: 6 Vital Signs Height(in): 67 Pulse(bpm): 73 Weight(lbs): 195 Blood Pressure 119/68 (mmHg): Body Mass Index(BMI): 31 Temperature(F):  98.4 Respiratory Rate 18 (breaths/min): Photos: [2:No Photos] [N/A:N/A] Wound Location: [2:Left Foot - Plantar] [N/A:N/A] Wounding Event: [2:Gradually Appeared] [N/A:N/A] Primary Etiology: [2:Diabetic Wound/Ulcer of the Lower Extremity] [N/A:N/A] Comorbid History: [2:Anemia, Chronic Obstructive Pulmonary Disease (COPD), Arrhythmia, Congestive Heart Failure, Coronary Artery Disease, Hypertension, Type II Diabetes, Neuropathy] [N/A:N/A] Date Acquired: [2:12/21/2014] [N/A:N/A] Weeks of Treatment: [2:6] [N/A:N/A] Wound Status: [2:Open] [N/A:N/A] Measurements L x W x Frank 0.5x0.5x0.1 [N/A:N/A] (cm) Area (cm) : [2:0.196] [N/A:N/A] Volume (cm) : [2:0.02] [N/A:N/A] % Reduction in Area: [2:75.00%] [N/A:N/A] % Reduction in Volume: 91.50% [N/A:N/A] Classification: [2:Grade 1] [N/A:N/A] Exudate Amount: [2:Small] [N/A:N/A] Exudate Type: [2:Serosanguineous] [N/A:N/A] Exudate Color: [2:red, brown] [N/A:N/A] Wound Margin: [2:Distinct, outline attached] [N/A:N/A] Granulation Amount: [2:Medium (34-66%)] [N/A:N/A] Granulation Quality: [2:Pink] [N/A:N/A] Necrotic Amount: [2:None Present (0%)] [N/A:N/A] Exposed Structures: Fascia: No N/A N/A Fat: No Tendon: No Muscle: No Joint: No Bone: No Limited to Skin Breakdown Epithelialization: Large (67-100%) N/A N/A Periwound Skin Texture: Callus: Yes N/A N/A Edema: No Excoriation: No Induration: No Crepitus: No Fluctuance: No Friable: No Rash:  No Scarring: No Periwound Skin Maceration: Yes N/A N/A Moisture: Moist: Yes Dry/Scaly: No Periwound Skin Color: Atrophie Blanche: No N/A N/A Cyanosis: No Ecchymosis: No Erythema: No Hemosiderin Staining: No Mottled: No Pallor: No Rubor: No Temperature: No Abnormality N/A N/A Tenderness on No N/A N/A Palpation: Wound Preparation: Ulcer Cleansing: N/A N/A Rinsed/Irrigated with Saline Topical Anesthetic Applied: Other: lidocaine 4% Treatment Notes Electronic Signature(s) Signed: 02/08/2016 10:43:28 AM By: Elpidio Eric BSN, RN Entered By: Elpidio Eric on 02/08/2016 10:43:28 Satcher, [name] ([id number]) -------------------------------------------------------------------------------- Multi-Disciplinary Care Plan Details Patient Name: Tyler Milo Frank. Date of Service: 02/08/2016 10:45 AM Medical Record Number: [id number] Patient Account Number: [id number] Date of Birth/Sex: 06-Aug-1956 (60 y.o. Male) Treating RN: Clover Mealy, RN, BSN, [place] Sink Primary Care Physician: Joen Laura Other Clinician: Referring Physician: Joen Laura Treating Physician/Extender: Altamese Raymond in Treatment: 6 Active Inactive Abuse / Safety / Falls / Self Care Management Nursing Diagnoses: Impaired home maintenance Impaired physical mobility Knowledge deficit related to: safety; personal, health (wound), emergency Potential for falls Self care deficit: actual or potential Goals: Patient will remain injury free Date Initiated: 12/28/2015 Goal Status: Active Patient/caregiver will verbalize understanding of skin care regimen Date Initiated: 12/28/2015 Goal Status: Active Patient/caregiver will verbalize/demonstrate measure taken to improve self care Date Initiated: 12/28/2015 Goal Status: Active Patient/caregiver will verbalize/demonstrate measures taken to improve the patient's personal safety Date Initiated: 12/28/2015 Goal Status: Active Patient/caregiver will verbalize/demonstrate measures taken  to prevent injury and/or falls Date Initiated: 12/28/2015 Goal Status: Active Patient/caregiver will verbalize/demonstrate understanding of what to do in case of emergency Date Initiated: 12/28/2015 Goal Status: Active Interventions: Assess fall risk on admission and as needed Assess: immobility, friction, shearing, incontinence upon admission and as needed Assess impairment of mobility on admission and as needed per policy Assess self care needs on admission and as needed Provide education on basic hygiene [name], [name] ([id number]) Provide education on fall prevention Provide education on personal and home safety Provide education on safe transfers Treatment Activities: Education provided on Basic Hygiene : 12/28/2015 Notes: Orientation to the Wound Care Program Nursing Diagnoses: Knowledge deficit related to the wound healing center program Goals: Patient/caregiver will verbalize understanding of the Wound Healing Center Program Date Initiated: 12/28/2015 Goal Status: Active Interventions: Provide education on orientation to the wound center Notes: Venous Leg Ulcer Nursing Diagnoses: Knowledge deficit related to disease process and management Potential for venous Insuffiency (use before diagnosis confirmed) Goals: Non-invasive venous studies are completed as  ordered Date Initiated: 12/28/2015 Goal Status: Active Patient/caregiver will verbalize understanding of disease process and disease management Date Initiated: 12/28/2015 Goal Status: Active Verify adequate tissue perfusion prior to therapeutic compression application Date Initiated: 12/28/2015 Goal Status: Active Interventions: Assess peripheral edema status every visit. Provide education on venous insufficiency Notes: Unangst, [name]. ([id number]) Wound/Skin Impairment Nursing Diagnoses: Impaired tissue integrity Knowledge deficit related to smoking impact on wound healing Knowledge deficit related to  ulceration/compromised skin integrity Goals: Patient/caregiver will verbalize understanding of skin care regimen Date Initiated: 12/28/2015 Goal Status: Active Ulcer/skin breakdown will have a volume reduction of 30% by week 4 Date Initiated: 12/28/2015 Goal Status: Active Ulcer/skin breakdown will have a volume reduction of 50% by week 8 Date Initiated: 12/28/2015 Goal Status: Active Ulcer/skin breakdown will have a volume reduction of 80% by week 12 Date Initiated: 12/28/2015 Goal Status: Active Ulcer/skin breakdown will heal within 14 weeks Date Initiated: 12/28/2015 Goal Status: Active Interventions: Assess patient/caregiver ability to obtain necessary supplies Assess patient/caregiver ability to perform ulcer/skin care regimen upon admission and as needed Assess ulceration(s) every visit Provide education on ulcer and skin care Notes: Electronic Signature(s) Signed: 02/08/2016 10:43:14 AM By: Elpidio Eric BSN, RN Entered By: Elpidio Eric on 02/08/2016 10:43:13 Pownall, [name]. ([id number]) -------------------------------------------------------------------------------- Pain Assessment Details Patient Name: Tyler Milo Frank. Date of Service: 02/08/2016 10:45 AM Medical Record Number: [id number] Patient Account Number: [id number] Date of Birth/Sex: Jun 04, 1956 (60 y.o. Male) Treating RN: Clover Mealy, RN, BSN, [place] Sink Primary Care Physician: Joen Laura Other Clinician: Referring Physician: Joen Laura Treating Physician/Extender: [name] Weeks in Treatment: 6 Active Problems Location of Pain Severity and Description of Pain Patient Has Paino No Site Locations Pain Management and Medication Current Pain Management: Electronic Signature(s) Signed: 02/08/2016 5:36:13 PM By: Elpidio Eric BSN, RN Entered By: Elpidio Eric on 02/08/2016 10:31:09 Lemberger, [name] ([id number]) -------------------------------------------------------------------------------- Patient/Caregiver Education  Details Patient Name: Tyler Milo Frank. Date of Service: 02/08/2016 10:45 AM Medical Record Number: [id number] Patient Account Number: [id number] Date of Birth/Gender: Sep 15, 1956 (60 y.o. Male) Treating RN: Clover Mealy, RN, BSN, [place] Sink Primary Care Physician: Joen Laura Other Clinician: Referring Physician: Joen Laura Treating Physician/Extender: Altamese [place] in Treatment: 6 Education Assessment Education Provided To: Patient Education Topics Provided Basic Hygiene: Methods: Explain/Verbal Responses: State content correctly Safety: Methods: Explain/Verbal Responses: State content correctly Venous: Methods: Explain/Verbal Responses: State content correctly Welcome To The Wound Care Center: Methods: Explain/Verbal Responses: State content correctly Wound/Skin Impairment: Methods: Explain/Verbal Responses: State content correctly Electronic Signature(s) Signed: 02/08/2016 5:36:13 PM By: Elpidio Eric BSN, RN Entered By: Elpidio Eric on 02/08/2016 10:55:05 [place], Jailyn Frank. ([id number]) -------------------------------------------------------------------------------- Wound Assessment Details Patient Name: Boivin, [name]. Date of Service: 02/08/2016 10:45 AM Medical Record Number: [id number] Patient Account Number: [id number] Date of Birth/Sex: [birth year] (60 y.o. Male) Treating RN: Clover Mealy, RN, BSN, [place] Sink Primary Care Physician: Joen Laura Other Clinician: Referring Physician: Joen Laura Treating Physician/Extender: [name] Weeks in Treatment: 6 Wound Status Wound Number: 2 Primary Diabetic Wound/Ulcer of the Lower Etiology: Extremity Wound Location: Left Foot - Plantar Wound Open Wounding Event: Gradually Appeared Status: Date Acquired: 12/21/2014 Comorbid Anemia, Chronic Obstructive Pulmonary Weeks Of Treatment: 6 History: Disease (COPD), Arrhythmia, Clustered Wound: No Congestive Heart Failure, Coronary Artery Disease, Hypertension, Type II Diabetes,  Neuropathy Photos Photo Uploaded By: Elpidio Eric on 02/08/2016 17:28:53 Wound Measurements Length: (cm) 0.5 Width: (cm) 0.5 Depth: (cm) 0.1 Area: (cm) 0.196 Volume: (cm) 0.02 % Reduction in Area: 75% % Reduction in Volume: 91.5% Epithelialization: Large (67-100%) Tunneling:  No Undermining: No Wound Description Classification: Grade 1 Wound Margin: Distinct, outline attached Exudate Amount: Small Exudate Type: Serosanguineous Exudate Color: red, brown Foul Odor After Cleansing: No Wound Bed Granulation Amount: Medium (34-66%) Exposed Structure Granulation Quality: Pink Fascia Exposed: No Purdom, [name]. ([id number]) Necrotic Amount: None Present (0%) Fat Layer Exposed: No Tendon Exposed: No Muscle Exposed: No Joint Exposed: No Bone Exposed: No Limited to Skin Breakdown Periwound Skin Texture Texture Color No Abnormalities Noted: No No Abnormalities Noted: No Callus: Yes Atrophie Blanche: No Crepitus: No Cyanosis: No Excoriation: No Ecchymosis: No Fluctuance: No Erythema: No Friable: No Hemosiderin Staining: No Induration: No Mottled: No Localized Edema: No Pallor: No Rash: No Rubor: No Scarring: No Temperature / Pain Moisture Temperature: No Abnormality No Abnormalities Noted: No Dry / Scaly: No Maceration: Yes Moist: Yes Wound Preparation Ulcer Cleansing: Rinsed/Irrigated with Saline Topical Anesthetic Applied: Other: lidocaine 4%, Treatment Notes Wound #2 (Left, Plantar Foot) 1. Cleansed with: Clean wound with Normal Saline 4. Dressing Applied: Aquacel Ag 5. Secondary Dressing Applied Bordered Foam Dressing Notes [place] Electronic Signature(s) Signed: 02/08/2016 10:42:59 AM By: Elpidio Eric BSN, RN Entered By: Elpidio Eric on 02/08/2016 10:42:58 Estrin, Tyler DMarland Kitchen ([id number]) -------------------------------------------------------------------------------- Vitals Details Patient Name: Tyler Milo Frank. Date of Service: 02/08/2016 10:45  AM Medical Record Number: [id number] Patient Account Number: [id number] Date of Birth/Sex: 02-01-1956 (60 y.o. Male) Treating RN: Afful, RN, BSN, [place] Sink Primary Care Physician: Joen Laura Other Clinician: Referring Physician: Joen Laura Treating Physician/Extender: Altamese  in Treatment: 6 Vital Signs Time Taken: 10:33 Temperature (F): 98.4 Height (in): 67 Pulse (bpm): 73 Weight (lbs): 195 Respiratory Rate (breaths/min): 18 Body Mass Index (BMI): 30.5 Blood Pressure (mmHg): 119/68 Reference Range: 80 - 120 mg / dl Electronic Signature(s) Signed: 02/08/2016 5:36:13 PM By: Elpidio Eric BSN, RN Entered By: Elpidio Eric on 02/08/2016 10:36:07

## 2016-02-15 ENCOUNTER — Encounter: Payer: Medicare Other | Attending: Internal Medicine | Admitting: Internal Medicine

## 2016-02-15 DIAGNOSIS — Z89511 Acquired absence of right leg below knee: Secondary | ICD-10-CM | POA: Insufficient documentation

## 2016-02-15 DIAGNOSIS — I509 Heart failure, unspecified: Secondary | ICD-10-CM | POA: Diagnosis not present

## 2016-02-15 DIAGNOSIS — I110 Hypertensive heart disease with heart failure: Secondary | ICD-10-CM | POA: Diagnosis not present

## 2016-02-15 DIAGNOSIS — Z794 Long term (current) use of insulin: Secondary | ICD-10-CM | POA: Diagnosis not present

## 2016-02-15 DIAGNOSIS — D649 Anemia, unspecified: Secondary | ICD-10-CM | POA: Diagnosis not present

## 2016-02-15 DIAGNOSIS — J449 Chronic obstructive pulmonary disease, unspecified: Secondary | ICD-10-CM | POA: Diagnosis not present

## 2016-02-15 DIAGNOSIS — L97522 Non-pressure chronic ulcer of other part of left foot with fat layer exposed: Secondary | ICD-10-CM | POA: Diagnosis not present

## 2016-02-15 DIAGNOSIS — E1140 Type 2 diabetes mellitus with diabetic neuropathy, unspecified: Secondary | ICD-10-CM | POA: Diagnosis not present

## 2016-02-15 DIAGNOSIS — E11621 Type 2 diabetes mellitus with foot ulcer: Secondary | ICD-10-CM | POA: Insufficient documentation

## 2016-02-15 DIAGNOSIS — E1151 Type 2 diabetes mellitus with diabetic peripheral angiopathy without gangrene: Secondary | ICD-10-CM | POA: Insufficient documentation

## 2016-02-15 DIAGNOSIS — I2510 Atherosclerotic heart disease of native coronary artery without angina pectoris: Secondary | ICD-10-CM | POA: Diagnosis not present

## 2016-02-16 NOTE — Progress Notes (Signed)
Tyler, Frank ([id number]) Visit Report for 02/15/2016 Chief Complaint Document Details Patient Name: [name], [name]. Date of Service: 02/15/2016 1:15 PM Medical Record Patient Account Number: [id number] Number: Treating RN: Tyler Mealy, RN, BSN, [name] 09/03/1956 [phone] y.o. Other Clinician: Date of Birth/Sex: Male) Treating [name], Frank Primary Care Physician: [name] Physician/Extender: G Referring Physician: Tedra Frank in Treatment: 7 Information Obtained from: Patient Chief Complaint Patient is here for review of a wound on his left first metatarsal head that is been there for the last 6 months Electronic Signature(s) Signed: 02/16/2016 7:58:31 AM By: Tyler Najjar MD Entered By: [name] on 02/15/2016 13:43:28 [name], [name] ([id number]) -------------------------------------------------------------------------------- HPI Details Patient Name: Tyler Milo D. Date of Service: 02/15/2016 1:15 PM Medical Record Patient Account Number: [id number] Number: Treating RN: Tyler Mealy, RN, BSN, [name] September 07, 1956 [phone] y.o. Other Clinician: Date of Birth/Sex: Male) Treating [name], [name] Primary Care Physician: [name] Physician/Extender: G Referring Physician: Tedra Frank in Treatment: 7 History of Present Illness HPI Description: 12/28/15. This patient is a type II diabetic on insulin. He had vascular interventions 2 weeks ago on 1/17. This included an angioplasty of the left superficial femoral and popliteal arteries as well as a percutaneous transluminal angioplasty of the left posterior tibial artery. Nevertheless he is here for a plantar wound that the patient says is been there for the last 6 months. He initially told us that he is applying Neosporin to this however he tells me also that he has been in a nursing home for the last year. I found that to be a somewhat on usual combination of fax. In any case the patient is a diabetic with diabetic  PAD. 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 His ABI on the left side today was 0.62 01/11/16; we have been using Prisma to the wound areas on these first metatarsal head on the left and the small wound on his left anterior ankle. He states that the area on his left metatarsal head was not changed all week, this might account for why there is so much maceration here. In the meantime he tells me he has fallen and hurt his shoulder x-ray is negative. He is also complaining of shortness of breath 01/18/16; the area on his left anterior lower leg/ankle is resolved. The area over his first metatarsal head looks better. There is less maceration tissue here. Surgical debridement to remove surface slough and nonviable subcutaneous tissue however this generally appears better. 01/25/16; patient arrives today with several areas which are small and superficial on the left leg. This may be from scratching which he admittedly does. The area over the left first plantar metatarsal head looks improved. There is no maceration surrounding skin looks normal. There is some epithelialization attempting to get over this wound. 02/01/16 the areas on the left leg have closed down. The area over the first plantar metatarsal head on the left looks stable to improved although there is some maceration around this. Switch to silver alginate/Aquacel Ag 02/08/16; the areas on his legs remain closed down. The area over the first plantar metatarsal head on the left has advancing epithelialization with only a small open area remaining. Patient always look short of breath when he comes to our clinic but he states his wheezing is improved. We switched to silver alginate last week due to maceration around the wound /4/17; the area on his leg remains closed. The area over the left first  plantar metatarsal head is fully epithelialized. Patient tells me he has new shoes coming for both areas prosthetic  leg on the right than the draining fluid on the left, this wound is certainly not ready for that type of force as of yet Electronic Signature(s) Signed: 02/16/2016 7:58:31 AM By: Tyler Najjar MD Entered By: [name] on 02/15/2016 13:44:44 [name], [name] ([id number]) -------------------------------------------------------------------------------- Physical Exam Details Patient Name: [name], [name] Date of Service: 02/15/2016 1:15 PM Medical Record Patient Account Number: [id number] Number: Treating RN: Tyler Mealy, RN, BSN, [name] 1955/12/08 [phone] y.o. Other Clinician: Date of Birth/Sex: Male) Treating Lysha Schrade, Javares Primary Care Physician: [name] Physician/Extender: G Referring Physician: Tedra Frank in Treatment: 7 Constitutional Patient is hypotensive.. Pulse regular and within target range for patient.Marland Kitchen Respirations regular, non-labored and within target range.. Temperature is normal and within the target range for the patient.. Patient's appearance is neat and clean. Appears in no acute distress. Well nourished and well developed.Marland Kitchen Respiratory Above normal respiratory effort noted. Respiratory rate elveaated. Cardiovascular Heart rhythm and rate regular, without murmur or gallop.. Pedal pulses absent bilaterally.. No edema there is no remaining wounds on the leg. Lymphatic None palpable in the popliteal or inguinal areas. Integumentary (Hair, Skin) Area over the left first metatarsal head is fully epithelialized . However the tissue remains fragile. Notes Wound exam; the area over the left first plantar metatarsal head is fully epithelialized Electronic Signature(s) Signed: 02/16/2016 7:58:31 AM By: Tyler Najjar MD Entered By: [name] on 02/15/2016 13:46:47 [name], [name] ([id number]) -------------------------------------------------------------------------------- Physician Orders Details Patient Name: Tyler Milo D. Date of Service: 02/15/2016  1:15 PM Medical Record Patient Account Number: [id number] Number: Treating RN: Tyler Mealy, RN, BSN, [name] February 10, 1956 [phone] y.o. Other Clinician: Date of Birth/Sex: Male) Treating Jaymin Waln, Zandyr Primary Care Physician: [name] Physician/Extender: G Referring Physician: Tedra Frank in Treatment: 7 Verbal / Phone Orders: Yes Clinician: Afful, RN, BSN, [name] Read Back and Verified: Yes Diagnosis Coding Wound Cleansing Wound #2 Left,Plantar Foot o Clean wound with Normal Saline. Anesthetic Wound #2 Left,Plantar Foot o Topical Lidocaine 4% cream applied to wound bed prior to debridement Skin Barriers/Peri-Wound Care Wound #2 Left,Plantar Foot o Skin Prep Primary Wound Dressing Wound #2 Left,Plantar Foot o Aquacel Ag Secondary Dressing Wound #2 Left,Plantar Foot o Boardered Foam Dressing Dressing Change Frequency Wound #2 Left,Plantar Foot o Change dressing every other day. Follow-up Appointments Wound #2 Left,Plantar Foot o Return Appointment in 1 week. Off-Loading Wound #2 Left,Plantar Foot o Open toe surgical shoe with peg assist. Dudek, [name] ([id number]) Additional Orders / Instructions Wound #2 Left,Plantar Foot o Increase protein intake. o Activity as tolerated Electronic Signature(s) Signed: 02/15/2016 4:43:54 PM By: Elpidio Eric BSN, RN Signed: 02/16/2016 7:58:31 AM By: Tyler Najjar MD Entered By: Elpidio Eric on 02/15/2016 13:36:16 [name], [name] ([id number]) -------------------------------------------------------------------------------- Problem List Details Patient Name: [name], [name] Date of Service: 02/15/2016 1:15 PM Medical Record Patient Account Number: [id number] Number: Treating RN: Tyler Mealy, RN, BSN, [name] May 25, 1956 [phone] y.o. 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 ([id number]) -------------------------------------------------------------------------------- Progress Note Details Patient Name: Tyler MiloHAM, Manases D. Date of Service: 02/15/2016 1:15 PM Medical Record Patient Account Number: [id number] Number: Treating RN: Tyler MealyAfful, RN, BSN, [name] [phone](59 y.o. Other Clinician: Date of Birth/Sex: Male) Treating Jerilynn Feldmeier, Malikhi Primary Care Physician: Tyler LauraBLISS, Frank Physician/Extender: G Referring Physician: Tedra SenegalBLISS, Frank Weeks in Treatment: 7 Subjective Chief Complaint Information obtained from Patient Patient is here for review of a wound on his left first metatarsal head that is been there for the last 6 months History of Present Illness (HPI) 12/28/15. This patient is a type II diabetic on insulin. He had vascular interventions 2 weeks ago on 1/17. This included an angioplasty of the left superficial femoral and popliteal arteries as well as a percutaneous transluminal angioplasty of the left posterior tibial artery. Nevertheless he is here for a plantar wound that the patient says is been there for the last 6 months. He initially told us that he is applying Neosporin to this however he tells me also that he has been in a nursing home for the last year. I found that to be a somewhat on usual combination of fax. In any case the patient is a diabetic with diabetic PAD. He is status post a right BKA in 1983 secondary to osteomyelitis in  his foot. His ABI on the left side today was 0.62 01/11/16; we have been using Prisma to the wound areas on these first metatarsal head on the left and the small wound on his left anterior ankle. He states that the area on his left metatarsal head was not changed all week, this might account for why there is so much maceration here. In the meantime he tells me he has fallen and hurt his shoulder x-ray is negative. He is also complaining of shortness of breath 01/18/16; the area on his left anterior lower leg/ankle is resolved. The area over his first metatarsal head looks better. There is less maceration tissue here. Surgical debridement to remove surface slough and nonviable subcutaneous tissue however this generally appears better. 01/25/16; patient arrives today with several areas which are small and superficial on the left leg. This may be from scratching which he admittedly does. The area over the left first plantar metatarsal head looks improved. There is no maceration surrounding skin looks normal. There is some epithelialization attempting to get over this wound. 02/01/16 the areas on the left leg have closed down. The area over the first plantar metatarsal head on the left looks stable to improved although there is some maceration around this. Switch to silver alginate/Aquacel Ag 02/08/16; the areas on his legs remain closed down. The area over the first plantar metatarsal head on the left has advancing epithelialization with only a small open area remaining. Patient always look short of breath when he comes to our clinic but he states his wheezing is improved. We switched to silver alginate last week due to maceration around the wound /4/17; the area on his leg remains closed. The area over the left first plantar metatarsal head is fully epithelialized. Patient tells me he has new shoes coming for both areas prosthetic leg on the right than the draining fluid on the left, this wound is  certainly not ready for that type of force as of yet [name], [name] ([id number]) Objective Constitutional Patient is hypotensive.. Pulse regular and within target range for patient.Marland Kitchen. Respirations regular, non-labored and within target range.. Temperature  is normal and within the target range for the patient.. Patient's appearance is neat and clean. Appears in no acute distress. Well nourished and well developed.. Vitals Time Taken: 1:27 PM, Height: 67 in, Weight: 195 lbs, BMI: 30.5, Temperature: 99.3 F, Pulse: 62 bpm, Respiratory Rate: 18 breaths/min, Blood Pressure: 94/70 mmHg. Respiratory Above normal respiratory effort noted. Respiratory rate elveaated. Cardiovascular Heart rhythm and rate regular, without murmur or gallop.. Pedal pulses absent bilaterally.. No edema there is no remaining wounds on the leg. Lymphatic None palpable in the popliteal or inguinal areas. General Notes: Wound exam; the area over the left first plantar metatarsal head is fully epithelialized Integumentary (Hair, Skin) Area over the left first metatarsal head is fully epithelialized . However the tissue remains fragile. Wound #2 status is Open. Original cause of wound was Gradually Appeared. The wound is located on the Left,Plantar Foot. The wound measures 0.5cm length x 0.5cm width x 0.1cm depth; 0.196cm^2 area and 0.02cm^3 volume. The wound is limited to skin breakdown. There is no tunneling or undermining noted. There is a small amount of serosanguineous drainage noted. The wound margin is distinct with the outline attached to the wound base. There is medium (34-66%) pink granulation within the wound bed. There is no necrotic tissue within the wound bed. The periwound skin appearance exhibited: Callus, Moist. The periwound skin appearance did not exhibit: Crepitus, Excoriation, Fluctuance, Friable, Induration, Localized Edema, Rash, Scarring, Dry/Scaly, Maceration, Atrophie Blanche, Cyanosis, Ecchymosis,  Hemosiderin Staining, Mottled, Pallor, Rubor, Erythema. Periwound temperature was noted as No Abnormality. Assessment Active Problems ICD-10 L97.522 - Non-pressure chronic ulcer of other part of left foot with fat layer exposed E11.621 - Type 2 diabetes mellitus with foot ulcer [name], [name] ([id number]) E11.51 - Type 2 diabetes mellitus with diabetic peripheral angiopathy without gangrene Plan Wound Cleansing: Wound #2 Left,Plantar Foot: Clean wound with Normal Saline. Anesthetic: Wound #2 Left,Plantar Foot: Topical Lidocaine 4% cream applied to wound bed prior to debridement Skin Barriers/Peri-Wound Care: Wound #2 Left,Plantar Foot: Skin Prep Primary Wound Dressing: Wound #2 Left,Plantar Foot: Aquacel Ag Secondary Dressing: Wound #2 Left,Plantar Foot: Boardered Foam Dressing Dressing Change Frequency: Wound #2 Left,Plantar Foot: Change dressing every other day. Follow-up Appointments: Wound #2 Left,Plantar Foot: Return Appointment in 1 week. Off-Loading: Wound #2 Left,Plantar Foot: Open toe surgical shoe with peg assist. Additional Orders / Instructions: Wound #2 Left,Plantar Foot: Increase protein intake. Activity as tolerated #1 we will continue with the same silver alginate and foam which can be changed every 2 days. #2 I have written a note that the patient is not yet ready to ambulate. Hopefully with a week of current therapy the surface will toughen up enough once his shoes arrived for the remaining left foot and the prosthetic right foot. [name], LOH ([id number]) Electronic Signature(s) Signed: 02/16/2016 7:58:31 AM By: Tyler Najjar MD Entered By: [name] on 02/15/2016 13:47:45 [name], [name] ([id number]) -------------------------------------------------------------------------------- SuperBill Details Patient Name: Tyler Milo D. Date of Service: 02/15/2016 Medical Record Patient Account Number: [id number] Number: Treating RN:  Tyler Mealy, RN, BSN, [name] July 31, 1956 [phone] y.o. Other Clinician: Date of Birth/Sex: Male) Treating Giovoni Bunch, Shivaan Primary Care Physician: [name] Physician/Extender: G Referring Physician: Tedra Frank in Treatment: 7 Diagnosis Coding ICD-10 Codes Code Description [id number] Non-pressure chronic ulcer of other part of left foot with fat layer exposed E11.621 Type 2 diabetes mellitus with foot ulcer E11.51 Type 2 diabetes mellitus with diabetic peripheral angiopathy without gangrene Facility Procedures CPT4 Code: [id number] Description: [id number] - WOUND CARE  VISIT-LEV 2 EST PT Modifier: Quantity: 1 Physician Procedures CPT4 Code: [id number] Description: 99213 - WC PHYS LEVEL 3 - EST PT ICD-10 Description Diagnosis E11.621 Type 2 diabetes mellitus with foot ulcer Modifier: Quantity: 1 Electronic Signature(s) Signed: 02/16/2016 7:58:31 AM By: Tyler Najjar MD Entered By: [name] on 02/15/2016 13:48:24

## 2016-02-16 NOTE — Progress Notes (Signed)
CARROLL, LINGELBACH ([id number]) Visit Report for 02/15/2016 Arrival Information Details Patient Name: [name], [name]. Date of Service: 02/15/2016 1:15 PM Medical Record Number: [id number] Patient Account Number: [id number] Date of Birth/Sex: [birth year] (60 y.o. Male) Treating RN: Clover Mealy, RN, BSN, [place] Sink Primary Care Physician: Joen Laura Other Clinician: Referring Physician: Joen Laura Treating Physician/Extender: Altamese [place] in Treatment: 7 Visit Information History Since Last Visit Added or deleted any medications: No Patient Arrived: Wheel Chair Any new allergies or adverse reactions: No Arrival Time: 13:24 Had a fall or experienced change in No activities of daily living that may affect Accompanied By: self risk of falls: Transfer Assistance: None Signs or symptoms of abuse/neglect since last No Patient Identification Verified: Yes visito Secondary Verification Process Yes Hospitalized since last visit: No Completed: Has Dressing in Place as Prescribed: Yes Patient Requires Transmission-Based No Pain Present Now: No Precautions: Patient Has Alerts: No Electronic Signature(s) Signed: 02/15/2016 4:43:54 PM By: Elpidio Eric BSN, RN Entered By: Elpidio Eric on 02/15/2016 13:25:34 [place], Ogden DMarland Kitchen ([id number]) -------------------------------------------------------------------------------- Clinic Level of Care Assessment Details Patient Name: Tyler [name] Date of Service: 02/15/2016 1:15 PM Medical Record Number: [id number] Patient Account Number: [id number] Date of Birth/Sex: [birth year] (60 y.o. Male) Treating RN: Clover Mealy, RN, BSN, [place] Sink Primary Care Physician: Joen Laura Other Clinician: Referring Physician: Joen Laura Treating Physician/Extender: Altamese  in Treatment: 7 Clinic Level of Care Assessment Items TOOL 4 Quantity Score  - Use when only an EandM is performed on FOLLOW-UP visit 0 ASSESSMENTS - Nursing Assessment / Reassessment  -  Reassessment of Co-morbidities (includes updates in patient status) 0  - Reassessment of Adherence to Treatment Plan 0 ASSESSMENTS - Wound and Skin Assessment / Reassessment X - Simple Wound Assessment / Reassessment - one wound 1 5  - Complex Wound Assessment / Reassessment - multiple wounds 0  - Dermatologic / Skin Assessment (not related to wound area) 0 ASSESSMENTS - Focused Assessment  - Circumferential Edema Measurements - multi extremities 0  - Nutritional Assessment / Counseling / Intervention 0 X - Lower Extremity Assessment (monofilament, tuning fork, pulses) 1 5  - Peripheral Arterial Disease Assessment (using hand held doppler) 0 ASSESSMENTS - Ostomy and/or Continence Assessment and Care  - Incontinence Assessment and Management 0  - Ostomy Care Assessment and Management (repouching, etc.) 0 PROCESS - Coordination of Care X - Simple Patient / Family Education for ongoing care 1 15  - Complex (extensive) Patient / Family Education for ongoing care 0  - Staff obtains Chiropractor, Records, Test Results / Process Orders 0 X - Staff telephones HHA, Nursing Homes / Clarify orders / etc 1 10  - Routine Transfer to another Facility (non-emergent condition) 0 Thibault, Machi D. ([id number])  - Routine Hospital Admission (non-emergent condition) 0  - New Admissions / Manufacturing engineer / Ordering NPWT, Apligraf, etc. 0  - Emergency Hospital Admission (emergent condition) 0  - Simple Discharge Coordination 0  - Complex (extensive) Discharge Coordination 0 PROCESS - Special Needs  - Pediatric / Minor Patient Management 0  - Isolation Patient Management 0  - Hearing / Language / Visual special needs 0  - Assessment of Community assistance (transportation, D/C planning, etc.) 0  - Additional assistance / Altered mentation 0  - Support Surface(s) Assessment (bed, cushion, seat, etc.) 0 INTERVENTIONS - Wound Cleansing / Measurement X - Simple  Wound Cleansing - one wound 1 5  - Complex Wound Cleansing - multiple wounds 0 X - Wound Imaging (photographs - any  number of wounds) 1 5 []  - Wound Tracing (instead of photographs) 0 X - Simple Wound Measurement - one wound 1 5 []  - Complex Wound Measurement - multiple wounds 0 INTERVENTIONS - Wound Dressings X - Small Wound Dressing one or multiple wounds 1 10 []  - Medium Wound Dressing one or multiple wounds 0 []  - Large Wound Dressing one or multiple wounds 0 []  - Application of Medications - topical 0 []  - Application of Medications - injection 0 INTERVENTIONS - Miscellaneous []  - External ear exam 0 Dewald, Samael D. ([id number]) []  - Specimen Collection (cultures, biopsies, blood, body fluids, etc.) 0 []  - Specimen(s) / Culture(s) sent or taken to Lab for analysis 0 []  - Patient Transfer (multiple staff / Michiel Sites Lift / Similar devices) 0 []  - Simple Staple / Suture removal (25 or less) 0 []  - Complex Staple / Suture removal (26 or more) 0 []  - Hypo / Hyperglycemic Management (close monitor of Blood Glucose) 0 []  - Ankle / Brachial Index (ABI) - do not check if billed separately 0 X - Vital Signs 1 5 Has the patient been seen at the hospital within the last three years: Yes Total Score: 65 Level Of Care: New/Established - Level 2 Electronic Signature(s) Signed: 02/15/2016 4:43:54 PM By: Elpidio Eric BSN, RN Entered By: Elpidio Eric on 02/15/2016 13:43:25 Schwarting, Haydn Algis Downs ([id number]) -------------------------------------------------------------------------------- Encounter Discharge Information Details Patient Name: Tyler [name] Date of Service: 02/15/2016 1:15 PM Medical Record Number: [id number] Patient Account Number: [id number] Date of Birth/Sex: [birth year] (60 y.o. Male) Treating RN: Clover Mealy, RN, BSN, [place] Sink Primary Care Physician: Joen Laura Other Clinician: Referring Physician: Joen Laura Treating Physician/Extender: Altamese Wentworth in Treatment: 7 Encounter  Discharge Information Items Discharge Pain Level: 0 Discharge Condition: Stable Ambulatory Status: Wheelchair Discharge Destination: Nursing Home Transportation: Other Accompanied By: self Schedule Follow-up Appointment: No Medication Reconciliation completed No and provided to Patient/Care Dewell Monnier: Provided on Clinical Summary of Care: 02/15/2016 Form Type Recipient Paper Patient [facility] Electronic Signature(s) Signed: 02/15/2016 4:43:54 PM By: Elpidio Eric BSN, RN Previous Signature: 02/15/2016 1:41:19 PM Version By: Gwenlyn Perking Entered By: Elpidio Eric on 02/15/2016 13:44:18 Renderos, Tamarius D. ([id number]) -------------------------------------------------------------------------------- Lower Extremity Assessment Details Patient Name: Golberg, Darlene D. Date of Service: 02/15/2016 1:15 PM Medical Record Number: [id number] Patient Account Number: [id number] Date of Birth/Sex: October 11, 1956 (60 y.o. Male) Treating RN: Clover Mealy, RN, BSN, [place] Sink Primary Care Physician: Joen Laura Other Clinician: Referring Physician: Joen Laura Treating Physician/Extender: [name] Weeks in Treatment: 7 Edema Assessment Assessed: [Left: No] [Right: No] E[Left: dema] [Right: :] Calf Left: Right: Point of Measurement: 34 cm From Medial Instep cm cm Ankle Left: Right: Point of Measurement: 8 cm From Medial Instep cm cm Vascular Assessment Pulses: Posterior Tibial Dorsalis Pedis Palpable: [Left:Yes] Extremity colors, hair growth, and conditions: Extremity Color: [Left:Mottled] Hair Growth on Extremity: [Left:No] Temperature of Extremity: [Left:Warm] Capillary Refill: [Left:< 3 seconds] Toe Nail Assessment Left: Right: Thick: Yes Discolored: Yes Deformed: No Improper Length and Hygiene: Yes Electronic Signature(s) Signed: 02/15/2016 4:43:54 PM By: Elpidio Eric BSN, RN Entered By: Elpidio Eric on 02/15/2016 13:28:29 Greiner, Oluwanifemi D.  ([id number]) -------------------------------------------------------------------------------- Multi Wound Chart Details Patient Name: Tyler [name] Date of Service: 02/15/2016 1:15 PM Medical Record Number: [id number] Patient Account Number: [id number] Date of Birth/Sex: [birth year] (60 y.o. Male) Treating RN: Clover Mealy, RN, BSN, [place] Sink Primary Care Physician: Joen Laura Other Clinician: Referring Physician: Joen Laura Treating Physician/Extender: [name] Weeks in Treatment: 7 Vital Signs Height(in): 67 Pulse(bpm): 62  Weight(lbs): 195 Blood Pressure 94/70 (mmHg): Body Mass Index(BMI): 31 Temperature(F): 99.3 Respiratory Rate 18 (breaths/min): Photos: [2:No Photos] [N/A:N/A] Wound Location: [2:Left Foot - Plantar] [N/A:N/A] Wounding Event: [2:Gradually Appeared] [N/A:N/A] Primary Etiology: [2:Diabetic Wound/Ulcer of the Lower Extremity] [N/A:N/A] Comorbid History: [2:Anemia, Chronic Obstructive Pulmonary Disease (COPD), Arrhythmia, Congestive Heart Failure, Coronary Artery Disease, Hypertension, Type II Diabetes, Neuropathy] [N/A:N/A] Date Acquired: [2:12/21/2014] [N/A:N/A] Weeks of Treatment: [2:7] [N/A:N/A] Wound Status: [2:Open] [N/A:N/A] Measurements L x W x D 0.5x0.5x0.1 [N/A:N/A] (cm) Area (cm) : [2:0.196] [N/A:N/A] Volume (cm) : [2:0.02] [N/A:N/A] % Reduction in Area: [2:75.00%] [N/A:N/A] % Reduction in Volume: 91.50% [N/A:N/A] Classification: [2:Grade 1] [N/A:N/A] Exudate Amount: [2:Small] [N/A:N/A] Exudate Type: [2:Serosanguineous] [N/A:N/A] Exudate Color: [2:red, brown] [N/A:N/A] Wound Margin: [2:Distinct, outline attached] [N/A:N/A] Granulation Amount: [2:Medium (34-66%)] [N/A:N/A] Granulation Quality: [2:Pink] [N/A:N/A] Necrotic Amount: [2:None Present (0%)] [N/A:N/A] Exposed Structures: Fascia: No N/A N/A Fat: No Tendon: No Muscle: No Joint: No Bone: No Limited to Skin Breakdown Epithelialization: Large (67-100%) N/A N/A Periwound Skin  Texture: Callus: Yes N/A N/A Edema: No Excoriation: No Induration: No Crepitus: No Fluctuance: No Friable: No Rash: No Scarring: No Periwound Skin Moist: Yes N/A N/A Moisture: Maceration: No Dry/Scaly: No Periwound Skin Color: Atrophie Blanche: No N/A N/A Cyanosis: No Ecchymosis: No Erythema: No Hemosiderin Staining: No Mottled: No Pallor: No Rubor: No Temperature: No Abnormality N/A N/A Tenderness on No N/A N/A Palpation: Wound Preparation: Ulcer Cleansing: N/A N/A Rinsed/Irrigated with Saline Topical Anesthetic Applied: Other: lidocaine 4% Treatment Notes Electronic Signature(s) Signed: 02/15/2016 1:31:21 PM By: Elpidio Eric BSN, RN Entered By: Elpidio Eric on 02/15/2016 13:31:20 Sorci, [name] ([id number]) -------------------------------------------------------------------------------- Multi-Disciplinary Care Plan Details Patient Name: Tyler [name] Date of Service: 02/15/2016 1:15 PM Medical Record Number: [id number] Patient Account Number: [id number] Date of Birth/Sex: Apr 10, 1956 (60 y.o. Male) Treating RN: Clover Mealy, RN, BSN, [place] Sink Primary Care Physician: Joen Laura Other Clinician: Referring Physician: Joen Laura Treating Physician/Extender: Altamese Harrell in Treatment: 7 Active Inactive Abuse / Safety / Falls / Self Care Management Nursing Diagnoses: Impaired home maintenance Impaired physical mobility Knowledge deficit related to: safety; personal, health (wound), emergency Potential for falls Self care deficit: actual or potential Goals: Patient will remain injury free Date Initiated: 12/28/2015 Goal Status: Active Patient/caregiver will verbalize understanding of skin care regimen Date Initiated: 12/28/2015 Goal Status: Active Patient/caregiver will verbalize/demonstrate measure taken to improve self care Date Initiated: 12/28/2015 Goal Status: Active Patient/caregiver will verbalize/demonstrate measures taken to improve the patient's  personal safety Date Initiated: 12/28/2015 Goal Status: Active Patient/caregiver will verbalize/demonstrate measures taken to prevent injury and/or falls Date Initiated: 12/28/2015 Goal Status: Active Patient/caregiver will verbalize/demonstrate understanding of what to do in case of emergency Date Initiated: 12/28/2015 Goal Status: Active Interventions: Assess fall risk on admission and as needed Assess: immobility, friction, shearing, incontinence upon admission and as needed Assess impairment of mobility on admission and as needed per policy Assess self care needs on admission and as needed Provide education on basic hygiene MENNO, VANBERGEN ([id number]) Provide education on fall prevention Provide education on personal and home safety Provide education on safe transfers Treatment Activities: Education provided on Basic Hygiene : 12/28/2015 Notes: Orientation to the Wound Care Program Nursing Diagnoses: Knowledge deficit related to the wound healing center program Goals: Patient/caregiver will verbalize understanding of the Wound Healing Center Program Date Initiated: 12/28/2015 Goal Status: Active Interventions: Provide education on orientation to the wound center Notes: Venous Leg Ulcer Nursing Diagnoses: Knowledge deficit related to disease process and management Potential for venous Insuffiency (  use before diagnosis confirmed) Goals: Non-invasive venous studies are completed as ordered Date Initiated: 12/28/2015 Goal Status: Active Patient/caregiver will verbalize understanding of disease process and disease management Date Initiated: 12/28/2015 Goal Status: Active Verify adequate tissue perfusion prior to therapeutic compression application Date Initiated: 12/28/2015 Goal Status: Active Interventions: Assess peripheral edema status every visit. Provide education on venous insufficiency Notes: Nations, Favor D. ([id number]) Wound/Skin Impairment Nursing  Diagnoses: Impaired tissue integrity Knowledge deficit related to smoking impact on wound healing Knowledge deficit related to ulceration/compromised skin integrity Goals: Patient/caregiver will verbalize understanding of skin care regimen Date Initiated: 12/28/2015 Goal Status: Active Ulcer/skin breakdown will have a volume reduction of 30% by week 4 Date Initiated: 12/28/2015 Goal Status: Active Ulcer/skin breakdown will have a volume reduction of 50% by week 8 Date Initiated: 12/28/2015 Goal Status: Active Ulcer/skin breakdown will have a volume reduction of 80% by week 12 Date Initiated: 12/28/2015 Goal Status: Active Ulcer/skin breakdown will heal within 14 weeks Date Initiated: 12/28/2015 Goal Status: Active Interventions: Assess patient/caregiver ability to obtain necessary supplies Assess patient/caregiver ability to perform ulcer/skin care regimen upon admission and as needed Assess ulceration(s) every visit Provide education on ulcer and skin care Notes: Electronic Signature(s) Signed: 02/15/2016 1:31:12 PM By: Elpidio EricAfful, Rita BSN, RN Entered By: Elpidio EricAfful, Rita on 02/15/2016 13:31:12 [place]combe, Kainalu D. ([id number]) -------------------------------------------------------------------------------- Pain Assessment Details Patient Name: Tyler MiloHAM, Darnelle D. Date of Service: 02/15/2016 1:15 PM Medical Record Number: [id number] Patient Account Number: [id number] Date of Birth/Sex: Nov 13, 1956 [age] y.o. Male) Treating RN: Clover MealyAfful, RN, BSN, [place] Sinkita Primary Care Physician: Joen LauraBLISS, LAURA Other Clinician: Referring Physician: Joen LauraBLISS, LAURA Treating Physician/Extender: Maxwell CaulOBSON, Devon G Weeks in Treatment: 7 Active Problems Location of Pain Severity and Description of Pain Patient Has Paino No Site Locations Pain Management and Medication Current Pain Management: Electronic Signature(s) Signed: 02/15/2016 4:43:54 PM By: Elpidio EricAfful, Rita BSN, RN Entered By: Elpidio EricAfful, Rita on 02/15/2016 13:27:39 Campanaro, Veverly FellsMICHAEL D.  ([id number]) -------------------------------------------------------------------------------- Patient/Caregiver Education Details Patient Name: Tyler MiloHAM, Nathen D. Date of Service: 02/15/2016 1:15 PM Medical Record Number: [id number] Patient Account Number: [id number] Date of Birth/Gender: Nov 13, 1956 [age] y.o. Male) Treating RN: Clover MealyAfful, RN, BSN, [place] Sinkita Primary Care Physician: Joen LauraBLISS, LAURA Other Clinician: Referring Physician: Joen LauraBLISS, LAURA Treating Physician/Extender: Altamese CarolinaOBSON, Syris G Weeks in Treatment: 7 Education Assessment Education Provided To: Patient Education Topics Provided Basic Hygiene: Methods: Explain/Verbal Responses: State content correctly Safety: Methods: Explain/Verbal Responses: State content correctly Venous: Methods: Explain/Verbal Responses: State content correctly Welcome To The Wound Care Center: Methods: Explain/Verbal Responses: State content correctly Wound/Skin Impairment: Methods: Explain/Verbal Responses: State content correctly Electronic Signature(s) Signed: 02/15/2016 4:43:54 PM By: Elpidio EricAfful, Rita BSN, RN Entered By: Elpidio EricAfful, Rita on 02/15/2016 13:44:38 Mittman, Raequon D. ([id number]) -------------------------------------------------------------------------------- Wound Assessment Details Patient Name: Brander, Harinder D. Date of Service: 02/15/2016 1:15 PM Medical Record Number: [id number] Patient Account Number: [id number] Date of Birth/Sex: Nov 13, 1956 [age] y.o. Male) Treating RN: Clover MealyAfful, RN, BSN, [place] Sinkita Primary Care Physician: Joen LauraBLISS, LAURA Other Clinician: Referring Physician: Joen LauraBLISS, LAURA Treating Physician/Extender: Maxwell CaulOBSON, Jaterrius G Weeks in Treatment: 7 Wound Status Wound Number: 2 Primary Diabetic Wound/Ulcer of the Lower Etiology: Extremity Wound Location: Left Foot - Plantar Wound Open Wounding Event: Gradually Appeared Status: Date Acquired: 12/21/2014 Comorbid Anemia, Chronic Obstructive Pulmonary Weeks Of Treatment: 7 History: Disease (COPD),  Arrhythmia, Clustered Wound: No Congestive Heart Failure, Coronary Artery Disease, Hypertension, Type II Diabetes, Neuropathy Photos Photo Uploaded By: Elpidio EricAfful, Rita on 02/15/2016 16:49:58 Wound Measurements Length: (cm) 0.5 Width: (cm) 0.5 Depth: (cm) 0.1 Area: (cm) 0.196 Volume: (cm) 0.02 % Reduction in  Area: 75% % Reduction in Volume: 91.5% Epithelialization: Large (67-100%) Tunneling: No Undermining: No Wound Description Classification: Grade 1 Wound Margin: Distinct, outline attached [place], Rashidi D. ([id number]) Foul Odor After Cleansing: No Exudate Amount: Small Exudate Type: Serosanguineous Exudate Color: red, brown Wound Bed Granulation Amount: Medium (34-66%) Exposed Structure Granulation Quality: Pink Fascia Exposed: No Necrotic Amount: None Present (0%) Fat Layer Exposed: No Tendon Exposed: No Muscle Exposed: No Joint Exposed: No Bone Exposed: No Limited to Skin Breakdown Periwound Skin Texture Texture Color No Abnormalities Noted: No No Abnormalities Noted: No Callus: Yes Atrophie Blanche: No Crepitus: No Cyanosis: No Excoriation: No Ecchymosis: No Fluctuance: No Erythema: No Friable: No Hemosiderin Staining: No Induration: No Mottled: No Localized Edema: No Pallor: No Rash: No Rubor: No Scarring: No Temperature / Pain Moisture Temperature: No Abnormality No Abnormalities Noted: No Dry / Scaly: No Maceration: No Moist: Yes Wound Preparation Ulcer Cleansing: Rinsed/Irrigated with Saline Topical Anesthetic Applied: Other: lidocaine 4%, Treatment Notes Wound #2 (Left, Plantar Foot) 1. Cleansed with: Clean wound with Normal Saline 4. Dressing Applied: Aquacel Ag 5. Secondary Dressing Applied Bordered Foam Dressing 7. Secured with Other (specify in notes) Staver, BERMAN GRAINGER. ([id number]) Notes [place] Electronic Signature(s) Signed: 02/15/2016 4:43:54 PM By: Elpidio Eric BSN, RN Entered By: Elpidio Eric on 02/15/2016 13:29:31 Beaudoin,  Dequann DMarland Kitchen ([id number]) -------------------------------------------------------------------------------- Vitals Details Patient Name: Tyler [name] Date of Service: 02/15/2016 1:15 PM Medical Record Number: [id number] Patient Account Number: [id number] Date of Birth/Sex: 04/19/1956 (60 y.o. Male) Treating RN: Afful, RN, BSN, [place] Sink Primary Care Physician: Joen Laura Other Clinician: Referring Physician: Joen Laura Treating Physician/Extender: Altamese Hitchcock in Treatment: 7 Vital Signs Time Taken: 13:27 Temperature (F): 99.3 Height (in): 67 Pulse (bpm): 62 Weight (lbs): 195 Respiratory Rate (breaths/min): 18 Body Mass Index (BMI): 30.5 Blood Pressure (mmHg): 94/70 Reference Range: 80 - 120 mg / dl Electronic Signature(s) Signed: 02/15/2016 4:43:54 PM By: Elpidio Eric BSN, RN Entered By: Elpidio Eric on 02/15/2016 13:27:59

## 2016-02-22 ENCOUNTER — Encounter: Payer: Medicare Other | Admitting: Internal Medicine

## 2016-02-22 DIAGNOSIS — E11621 Type 2 diabetes mellitus with foot ulcer: Secondary | ICD-10-CM | POA: Diagnosis not present

## 2016-02-22 NOTE — Progress Notes (Signed)
JANELLE, CULTON ([id number]) Visit Report for 02/22/2016 Chief Complaint Document Details Patient Name: [name], [name]. Date of Service: 02/22/2016 10:45 AM Medical Record Patient Account Number: [id number] Number: Treating RN: Clover Mealy, RN, BSN, Rita 12/23/1955 [phone] y.o. Other Clinician: Date of Birth/Sex: Male) Treating Cashius Grandstaff, Cinch Primary Care Physician: Joen Laura Physician/Extender: G Referring Physician: Tedra Senegal in Treatment: 8 Information Obtained from: Patient Chief Complaint Patient is here for review of a wound on his left first metatarsal head that is been there for the last 6 months Electronic Signature(s) Signed: 02/22/2016 3:36:31 PM By: Baltazar Najjar MD Entered By: Baltazar Najjar on 02/22/2016 11:43:27 Sellers, Giovanne Frank. ([id number]) -------------------------------------------------------------------------------- Debridement Details Patient Name: Tyler Milo Frank. Date of Service: 02/22/2016 10:45 AM Medical Record Patient Account Number: [id number] Number: Treating RN: Clover Mealy, RN, BSN, Rita 08-05-1956 [phone] y.o. Other Clinician: Date of Birth/Sex: Male) Treating Deijah Spikes, [place] Primary Care Physician: Joen Laura Physician/Extender: G Referring Physician: Tedra Senegal in Treatment: 8 Debridement Performed for Wound #2 Left,Plantar Foot Assessment: Performed By: Physician [name], MD Debridement: Debridement Start Time: 11:00 Total Area Debrided (L x 2 (cm) x 2 (cm) = 4 (cm) W): Tissue and other Non-Viable, Callus, Fibrin/Slough, Subcutaneous material debrided: Instrument: Blade, Forceps Bleeding: Minimum Hemostasis Achieved: Pressure End Time: 11:05 Procedural Pain: 0 Post Procedural Pain: 0 Response to Treatment: Procedure was tolerated well Post Debridement Measurements of Total Wound Length: (cm) 2 Width: (cm) 2 Depth: (cm) 0.2 Volume: (cm) 0.628 Post Procedure Diagnosis Same as  Pre-procedure Electronic Signature(s) Signed: 02/22/2016 3:36:31 PM By: Baltazar Najjar MD Signed: 02/22/2016 4:07:00 PM By: Elpidio Eric BSN, RN Entered By: Baltazar Najjar on 02/22/2016 11:43:08 [name], [name]. ([id number]) -------------------------------------------------------------------------------- HPI Details Patient Name: Tyler Milo Frank. Date of Service: 02/22/2016 10:45 AM Medical Record Patient Account Number: [id number] Number: Treating RN: Clover Mealy, RN, BSN, Rita September 24, 1956 [phone] y.o. Other Clinician: Date of Birth/Sex: Male) Treating Kito Cuffe, Josep Primary Care Physician: Joen Laura Physician/Extender: G Referring Physician: Tedra Sen[place] in Treatment: 8 History of Present Illness HPI Description: 12/28/15. This patient is a type II diabetic on insulin. He had vascular interventions 2 weeks ago on 1/17. This included an angioplasty of the left superficial femoral and popliteal arteries as well as a percutaneous transluminal angioplasty of the left posterior tibial artery. Nevertheless he is here for a plantar wound that the patient says is been there for the last 6 months. He initially told us that he is applying Neosporin to this however he tells me also that he has been in a nursing home for the last year. I found that to be a somewhat on usual combination of fax. In any case the patient is a diabetic with diabetic PAD. 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 His ABI on the left side today was 0.62 01/11/16; we have been using Prisma to the wound areas on these first metatarsal head on the left and the small wound on his left anterior ankle. He states that the area on his left metatarsal head was not changed all week, this might account for why there is so much maceration here. In the meantime he tells me he has fallen and hurt his shoulder x-ray is negative. He is also complaining of shortness of breath 01/18/16; the area on  his left anterior lower leg/ankle is resolved. The area over his first metatarsal head looks better. There is less maceration tissue here. Surgical debridement to remove  surface slough and nonviable subcutaneous tissue however this generally appears better. 01/25/16; patient arrives today with several areas which are small and superficial on the left leg. This may be from scratching which he admittedly does. The area over the left first plantar metatarsal head looks improved. There is no maceration surrounding skin looks normal. There is some epithelialization attempting to get over this wound. 02/01/16 the areas on the left leg have closed down. The area over the first plantar metatarsal head on the left looks stable to improved although there is some maceration around this. Switch to silver alginate/Aquacel Ag 02/08/16; the areas on his legs remain closed down. The area over the first plantar metatarsal head on the left has advancing epithelialization with only a small open area remaining. Patient always look short of breath when he comes to our clinic but he states his wheezing is improved. We switched to silver alginate last week due to maceration around the wound 4/4the area on his leg remains closed. The area over the left first plantar metatarsal head is fully epithelialized. Patient tells me he has new shoes coming for both areas prosthetic leg on the right than the draining fluid on the left, this wound is certainly not ready for that type of force as of yet 02/22/16; the left first plantar metatarsal head has again split wide open which is exceptionally disappointing. Macerated surrounding skin debridement with subcutaneous tissue. Patient states he was exercising on an exercise bike, this friction over this area may of reopened this. Electronic Signature(s) Signed: 02/22/2016 3:36:31 PM By: Baltazar Najjarobson, Floyd MD Entered By: Baltazar Najjarobson, Izaiha on 02/22/2016 11:44:55 [name], Tyler FellsMICHAEL Frank.  ([id number]) [name], Tyler FellsMICHAEL Frank. ([id number]) -------------------------------------------------------------------------------- Physical Exam Details Patient Name: Trigo, Tammy Frank. Date of Service: 02/22/2016 10:45 AM Medical Record Patient Account Number: [id number] Number: Treating RN: Clover MealyAfful, RN, BSN, Rita [birth year] [phone](59 y.o. Other Clinician: Date of Birth/Sex: Male) Treating Charmon Thorson, Sajan Primary Care Physician: Joen LauraBLISS, LAURA Physician/Extender: G Referring Physician: Tedra SenegalBLISS, LAURA Weeks in Treatment: 8 Notes The area over the first metatarsal head as widely reopened although the back the base of this does not look that unhealthy there is surrounding macerated skin and subcutaneous tissue Electronic Signature(s) Signed: 02/22/2016 3:36:31 PM By: Baltazar Najjarobson, Rishawn MD Entered By: Baltazar Najjarobson, Cassie on 02/22/2016 12:00:21 Dimarzo, Tyler FellsMICHAEL Frank. ([id number]) -------------------------------------------------------------------------------- Physician Orders Details Patient Name: [name], [name]. Date of Service: 02/22/2016 10:45 AM Medical Record Patient Account Number: [id number] Number: Treating RN: Clover MealyAfful, RN, BSN, Rita [birth year] [phone](59 y.o. Other Clinician: Date of Birth/Sex: Male) Treating Jolynn Bajorek, Loney Primary Care Physician: Joen LauraBLISS, LAURA Physician/Extender: G Referring Physician: Tedra SenegalBLISS, LAURA Weeks in Treatment: 8 Verbal / Phone Orders: Yes Clinician: Afful, RN, BSN, Rita Read Back and Verified: Yes Diagnosis Coding Wound Cleansing Wound #2 Left,Plantar Foot o Clean wound with Normal Saline. Anesthetic Wound #2 Left,Plantar Foot o Topical Lidocaine 4% cream applied to wound bed prior to debridement Skin Barriers/Peri-Wound Care Wound #2 Left,Plantar Foot o Skin Prep Primary Wound Dressing Wound #2 Left,Plantar Foot o Aquacel Ag Secondary Dressing Wound #2 Left,Plantar Foot o Boardered Foam Dressing Dressing Change Frequency Wound #2 Left,Plantar Foot o  Change dressing every other day. Follow-up Appointments Wound #2 Left,Plantar Foot o Return Appointment in 1 week. Off-Loading Wound #2 Left,Plantar Foot o Open toe surgical shoe with peg assist. Petkus, Daoud Frank. ([id number]) Additional Orders / Instructions Wound #2 Left,Plantar Foot o Increase protein intake. o Activity as tolerated Electronic Signature(s) Signed: 02/22/2016 3:36:31 PM By: Baltazar Najjarobson, Braxtyn MD Signed: 02/22/2016 4:07:00 PM  By: Elpidio Eric BSN, RN Entered By: Elpidio Eric on 02/22/2016 11:06:28 Flora Lipps ([id number]) -------------------------------------------------------------------------------- Problem List Details Patient Name: Tyler Milo Frank. Date of Service: 02/22/2016 10:45 AM Medical Record Patient Account Number: [id number] Number: Treating RN: Clover Mealy, RN, BSN, Rita January 06, 1956 [phone] y.o. 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 ([id number]) -------------------------------------------------------------------------------- Progress Note Details Patient Name: Tyler Milo Frank. Date of Service: 02/22/2016 10:45 AM Medical Record Patient Account Number: [id number] Number: Treating RN: Clover Mealy, RN, BSN, Rita Sep 30, 1956 [phone] y.o. Other Clinician: Date of Birth/Sex: Male) Treating Gusta Marksberry,  Brenda Primary Care Physician: Joen Laura Physician/Extender: G Referring Physician: Tedra Sen[place] in Treatment: 8 Subjective Chief Complaint Information obtained from Patient Patient is here for review of a wound on his left first metatarsal head that is been there for the last 6 months History of Present Illness (HPI) 12/28/15. This patient is a type II diabetic on insulin. He had vascular interventions 2 weeks ago on 1/17. This included an angioplasty of the left superficial femoral and popliteal arteries as well as a percutaneous transluminal angioplasty of the left posterior tibial artery. Nevertheless he is here for a plantar wound that the patient says is been there for the last 6 months. He initially told us that he is applying Neosporin to this however he tells me also that he has been in a nursing home for the last year. I found that to be a somewhat on usual combination of fax. In any case the patient is a diabetic with diabetic PAD. 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 His ABI on the left side today was 0.62 01/11/16; we have been using Prisma to the wound areas on these first metatarsal head on the left and the small wound on his left anterior ankle. He states that the area on his left metatarsal head was not changed all week, this might account for why there is so much maceration here. In the meantime he tells me he has fallen and hurt his shoulder x-ray is negative. He is also complaining of shortness of breath 01/18/16; the area on his left anterior lower leg/ankle is resolved. The area over his first metatarsal head looks better. There is less maceration tissue here. Surgical debridement to remove surface slough and nonviable subcutaneous tissue however this generally appears better. 01/25/16; patient arrives today with several areas which are small and superficial on the left leg. This may be from scratching which he admittedly  does. The area over the left first plantar metatarsal head looks improved. There is no maceration surrounding skin looks normal. There is some epithelialization attempting to get over this wound. 02/01/16 the areas on the left leg have closed down. The area over the first plantar metatarsal head on the left looks stable to improved although there is some maceration around this. Switch to silver alginate/Aquacel Ag 02/08/16; the areas on his legs remain closed down. The area over the first  plantar metatarsal head on the left has advancing epithelialization with only a small open area remaining. Patient always look short of breath when he comes to our clinic but he states his wheezing is improved. We switched to silver alginate last week due to maceration around the wound 4/4the area on his leg remains closed. The area over the left first plantar metatarsal head is fully epithelialized. Patient tells me he has new shoes coming for both areas prosthetic leg on the right than the draining fluid on the left, this wound is certainly not ready for that type of force as of yet 02/22/16; the left first plantar metatarsal head has again split wide open which is exceptionally disappointing. Macerated surrounding skin debridement with subcutaneous tissue. Patient states he was exercising on an exercise bike, this friction over this area may of reopened this. [name], [name]. ([id number]) Objective Constitutional Vitals Time Taken: 10:59 AM, Height: 67 in, Weight: 195 lbs, BMI: 30.5, Temperature: 97.4 F, Pulse: 66 bpm, Respiratory Rate: 18 breaths/min, Blood Pressure: 125/71 mmHg. Integumentary (Hair, Skin) Wound #2 status is Open. Original cause of wound was Gradually Appeared. The wound is located on the Left,Plantar Foot. The wound measures 2cm length x 2cm width x 0.2cm depth; 3.142cm^2 area and 0.628cm^3 volume. The wound is limited to skin breakdown. There is no tunneling noted. There is a medium  amount of serosanguineous drainage noted. The wound margin is distinct with the outline attached to the wound base. There is medium (34-66%) pink granulation within the wound bed. There is no necrotic tissue within the wound bed. The periwound skin appearance exhibited: Callus, Maceration, Moist. The periwound skin appearance did not exhibit: Crepitus, Excoriation, Fluctuance, Friable, Induration, Localized Edema, Rash, Scarring, Dry/Scaly, Atrophie Blanche, Cyanosis, Ecchymosis, Hemosiderin Staining, Mottled, Pallor, Rubor, Erythema. Periwound temperature was noted as No Abnormality. Assessment Active Problems ICD-10 L97.522 - Non-pressure chronic ulcer of other part of left foot with fat layer exposed E11.621 - Type 2 diabetes mellitus with foot ulcer E11.51 - Type 2 diabetes mellitus with diabetic peripheral angiopathy without gangrene Procedures Wound #2 Wound #2 is a Diabetic Wound/Ulcer of the Lower Extremity located on the Left,Plantar Foot . There was a Debridement ([id number]) debridement with total area of 4 sq cm performed by [name], MD. with the following instrument(s): Blade and Forceps to remove Non-Viable tissue/material including Fibrin/Slough, Callus, and Subcutaneous. A Minimum amount of bleeding was controlled with Pressure. The procedure was tolerated well with a pain level of 0 throughout and a pain level of 0 following the [name], [name]. ([id number]) procedure. Post Debridement Measurements: 2cm length x 2cm width x 0.2cm depth; 0.628cm^3 volume. Post procedure Diagnosis Wound #2: Same as Pre-Procedure Plan Wound Cleansing: Wound #2 Left,Plantar Foot: Clean wound with Normal Saline. Anesthetic: Wound #2 Left,Plantar Foot: Topical Lidocaine 4% cream applied to wound bed prior to debridement Skin Barriers/Peri-Wound Care: Wound #2 Left,Plantar Foot: Skin Prep Primary Wound Dressing: Wound #2 Left,Plantar Foot: Aquacel Ag Secondary Dressing: Wound #2  Left,Plantar Foot: Boardered Foam Dressing Dressing Change Frequency: Wound #2 Left,Plantar Foot: Change dressing every other day. Follow-up Appointments: Wound #2 Left,Plantar Foot: Return Appointment in 1 week. Off-Loading: Wound #2 Left,Plantar Foot: Open toe surgical shoe with peg assist. Additional Orders / Instructions: Wound #2 Left,Plantar Foot: Increase protein intake. Activity as tolerated Aquacel AG,border foam Electronic Signature(s) Signed: 02/22/2016 3:36:31 PM By: Baltazar Najjar MD Warwick, PHELAN SCHADT ([id number]) Entered By: Baltazar Najjar on 02/22/2016 12:01:59 Torpey, Tyler Fells ([id number]) -------------------------------------------------------------------------------- SuperBill Details Patient Name:  [name], [name]. Date of Service: 02/22/2016 Medical Record Patient Account Number: [id number] Number: Treating RN: Clover Mealy, RN, BSN, Rita 12/14/1955 [phone] y.o. Other Clinician: Date of Birth/Sex: Male) Treating Fread Kottke, Donato Primary Care Physician: Joen Laura Physician/Extender: G Referring Physician: Tedra Sen[place] in Treatment: 8 Diagnosis Coding ICD-10 Codes Code Description [phone] Non-pressure chronic ulcer of other part of left foot with fat layer exposed E11.621 Type 2 diabetes mellitus with foot ulcer E11.51 Type 2 diabetes mellitus with diabetic peripheral angiopathy without gangrene Facility Procedures CPT4 Code: [id number] Description: 11042 - DEB SUBQ TISSUE 20 SQ CM/< ICD-10 Description Diagnosis E11.621 Type 2 diabetes mellitus with foot ulcer Modifier: Quantity: 1 Physician Procedures CPT4 Code: [id number] Description: 11042 - WC PHYS SUBQ TISS 20 SQ CM ICD-10 Description Diagnosis E11.621 Type 2 diabetes mellitus with foot ulcer Modifier: Quantity: 1 Electronic Signature(s) Signed: 02/22/2016 3:36:31 PM By: Baltazar Najjar MD Entered By: Baltazar Najjar on 02/22/2016 12:05:39

## 2016-02-22 NOTE — Progress Notes (Signed)
[name], [name] ([id number]) Visit Report for 02/22/2016 Arrival Information Details Patient Name: [name], [name] Date of Service: 02/22/2016 10:45 AM Medical Record Number: [id number] Patient Account Number: [id number] Date of Birth/Sex: 03/27/1956 [age] y.o. Male) Treating RN: Clover MealyAfful, RN, BSN, [place] Sinkita Primary Care Physician: Joen LauraBLISS, LAURA Other Clinician: Referring Physician: Joen LauraBLISS, LAURA Treating Physician/Extender: Altamese CarolinaOBSON, Hassell G Weeks in Treatment: 8 Visit Information History Since Last Visit Added or deleted any medications: No Patient Arrived: Wheel Chair Any new allergies or adverse reactions: No Arrival Time: 10:54 Had a fall or experienced change in No activities of daily living that may affect Accompanied By: self risk of falls: Transfer Assistance: None Signs or symptoms of abuse/neglect since last No Patient Identification Verified: Yes visito Secondary Verification Process Yes Has Dressing in Place as Prescribed: Yes Completed: Pain Present Now: No Patient Requires Transmission-Based No Precautions: Patient Has Alerts: No Electronic Signature(s) Signed: 02/22/2016 4:07:00 PM By: Elpidio EricAfful, Rita BSN, RN Entered By: Elpidio EricAfful, Rita on 02/22/2016 10:55:01 [name], [name] ([id number]) -------------------------------------------------------------------------------- Encounter Discharge Information Details Patient Name: [name], [name] Date of Service: 02/22/2016 10:45 AM Medical Record Number: [id number] Patient Account Number: [id number] Date of Birth/Sex: 03/27/1956 [age] y.o. Male) Treating RN: Clover MealyAfful, RN, BSN, [place] Sinkita Primary Care Physician: Joen LauraBLISS, LAURA Other Clinician: Referring Physician: Joen LauraBLISS, LAURA Treating Physician/Extender: Altamese CarolinaOBSON, Demitrus G Weeks in Treatment: 8 Encounter Discharge Information Items Schedule Follow-up Appointment: No Medication Reconciliation completed No and provided to Patient/Care Keyarah Mcroy: Provided on Clinical Summary of Care: 02/22/2016 Form  Type [facility]ipient Paper [facility] Electronic Signature(s) Signed: 02/22/2016 11:11:25 AM By: Gwenlyn PerkingMoore, Shelia Entered By: Gwenlyn PerkingMoore, Shelia on 02/22/2016 11:11:25 [name], [name] ([id number]) -------------------------------------------------------------------------------- Lower Extremity Assessment Details Patient Name: Mcbreen, Jalil D. Date of Service: 02/22/2016 10:45 AM Medical Record Number: [id number] Patient Account Number: [id number] Date of Birth/Sex: 03/27/1956 [age] y.o. Male) Treating RN: Clover MealyAfful, RN, BSN, [place] Sinkita Primary Care Physician: Joen LauraBLISS, LAURA Other Clinician: Referring Physician: Joen LauraBLISS, LAURA Treating Physician/Extender: Maxwell CaulOBSON, Gad G Weeks in Treatment: 8 Edema Assessment Assessed: [Left: No] [Right: No] Edema: [Left: N] [Right: o] Calf Left: Right: Point of Measurement: 34 cm From Medial Instep cm cm Ankle Left: Right: Point of Measurement: 8 cm From Medial Instep cm cm Vascular Assessment Pulses: Posterior Tibial Dorsalis Pedis Palpable: [Left:Yes] Extremity colors, hair growth, and conditions: Extremity Color: [Left:Mottled] Hair Growth on Extremity: [Left:No] Temperature of Extremity: [Left:Warm] Capillary Refill: [Left:< 3 seconds] Toe Nail Assessment Left: Right: Thick: Yes Discolored: Yes Deformed: No Improper Length and Hygiene: Yes Electronic Signature(s) Signed: 02/22/2016 4:07:00 PM By: Elpidio EricAfful, Rita BSN, RN Entered By: Elpidio EricAfful, Rita on 02/22/2016 10:55:34 [name], [name] ([id number]) -------------------------------------------------------------------------------- Multi Wound Chart Details Patient Name: [name], [name] Date of Service: 02/22/2016 10:45 AM Medical Record Number: [id number] Patient Account Number: [id number] Date of Birth/Sex: 03/27/1956 [age] y.o. Male) Treating RN: Clover MealyAfful, RN, BSN, [place] Sinkita Primary Care Physician: Joen LauraBLISS, LAURA Other Clinician: Referring Physician: Joen LauraBLISS, LAURA Treating Physician/Extender: Maxwell CaulOBSON, Treyvion G Weeks in Treatment:  8 Vital Signs Height(in): 67 Pulse(bpm): 66 Weight(lbs): 195 Blood Pressure (mmHg): Body Mass Index(BMI): 31 Temperature(F): 97.4 Respiratory Rate 18 (breaths/min): Photos: [2:No Photos] [N/A:N/A] Wound Location: [2:Left Foot - Plantar] [N/A:N/A] Wounding Event: [2:Gradually Appeared] [N/A:N/A] Primary Etiology: [2:Diabetic Wound/Ulcer of the Lower Extremity] [N/A:N/A] Comorbid History: [2:Anemia, Chronic Obstructive Pulmonary Disease (COPD), Arrhythmia, Congestive Heart Failure, Coronary Artery Disease, Hypertension, Type II Diabetes, Neuropathy] [N/A:N/A] Date Acquired: [2:12/21/2014] [N/A:N/A] Weeks of Treatment: [2:8] [N/A:N/A] Wound Status: [2:Open] [N/A:N/A] Measurements L x W x D 2x2x0.2 [N/A:N/A] (cm) Area (cm) : [2:3.142] [N/A:N/A] Volume (cm) : [  2:0.628] [N/A:N/A] % Reduction in Area: [2:-300.30%] [N/A:N/A] % Reduction in Volume: -166.10% [N/A:N/A] Classification: [2:Grade 1] [N/A:N/A] Exudate Amount: [2:Medium] [N/A:N/A] Exudate Type: [2:Serosanguineous] [N/A:N/A] Exudate Color: [2:red, brown] [N/A:N/A] Wound Margin: [2:Distinct, outline attached] [N/A:N/A] Granulation Amount: [2:Medium (34-66%)] [N/A:N/A] Granulation Quality: [2:Pink] [N/A:N/A] Necrotic Amount: [2:None Present (0%)] [N/A:N/A] Exposed Structures: Fascia: No N/A N/A Fat: No Tendon: No Muscle: No Joint: No Bone: No Limited to Skin Breakdown Epithelialization: Medium (34-66%) N/A N/A Periwound Skin Texture: Callus: Yes N/A N/A Edema: No Excoriation: No Induration: No Crepitus: No Fluctuance: No Friable: No Rash: No Scarring: No Periwound Skin Maceration: Yes N/A N/A Moisture: Moist: Yes Dry/Scaly: No Periwound Skin Color: Atrophie Blanche: No N/A N/A Cyanosis: No Ecchymosis: No Erythema: No Hemosiderin Staining: No Mottled: No Pallor: No Rubor: No Temperature: No Abnormality N/A N/A Tenderness on No N/A N/A Palpation: Wound Preparation: Ulcer Cleansing: N/A  N/A Rinsed/Irrigated with Saline Topical Anesthetic Applied: Other: lidocaine 4% Treatment Notes Electronic Signature(s) Signed: 02/22/2016 4:07:00 PM By: Elpidio Eric BSN, RN Entered By: Elpidio Eric on 02/22/2016 10:59:28 Emmerich, Tyler Fells ([id number]) -------------------------------------------------------------------------------- Multi-Disciplinary Care Plan Details Patient Name: Tyler Milo D. Date of Service: 02/22/2016 10:45 AM Medical Record Number: [id number] Patient Account Number: [id number] Date of Birth/Sex: 1956-03-12 (60 y.o. Male) Treating RN: Clover Mealy, RN, BSN, [place] Sink Primary Care Physician: Joen Laura Other Clinician: Referring Physician: Joen Laura Treating Physician/Extender: Altamese  in Treatment: 8 Active Inactive Abuse / Safety / Falls / Self Care Management Nursing Diagnoses: Impaired home maintenance Impaired physical mobility Knowledge deficit related to: safety; personal, health (wound), emergency Potential for falls Self care deficit: actual or potential Goals: Patient will remain injury free Date Initiated: 12/28/2015 Goal Status: Active Patient/caregiver will verbalize understanding of skin care regimen Date Initiated: 12/28/2015 Goal Status: Active Patient/caregiver will verbalize/demonstrate measure taken to improve self care Date Initiated: 12/28/2015 Goal Status: Active Patient/caregiver will verbalize/demonstrate measures taken to improve the patient's personal safety Date Initiated: 12/28/2015 Goal Status: Active Patient/caregiver will verbalize/demonstrate measures taken to prevent injury and/or falls Date Initiated: 12/28/2015 Goal Status: Active Patient/caregiver will verbalize/demonstrate understanding of what to do in case of emergency Date Initiated: 12/28/2015 Goal Status: Active Interventions: Assess fall risk on admission and as needed Assess: immobility, friction, shearing, incontinence upon admission and as  needed Assess impairment of mobility on admission and as needed per policy Assess self care needs on admission and as needed Provide education on basic hygiene FREDDRICK, GLADSON ([id number]) Provide education on fall prevention Provide education on personal and home safety Provide education on safe transfers Treatment Activities: Education provided on Basic Hygiene : 12/28/2015 Notes: Orientation to the Wound Care Program Nursing Diagnoses: Knowledge deficit related to the wound healing center program Goals: Patient/caregiver will verbalize understanding of the Wound Healing Center Program Date Initiated: 12/28/2015 Goal Status: Active Interventions: Provide education on orientation to the wound center Notes: Venous Leg Ulcer Nursing Diagnoses: Knowledge deficit related to disease process and management Potential for venous Insuffiency (use before diagnosis confirmed) Goals: Non-invasive venous studies are completed as ordered Date Initiated: 12/28/2015 Goal Status: Active Patient/caregiver will verbalize understanding of disease process and disease management Date Initiated: 12/28/2015 Goal Status: Active Verify adequate tissue perfusion prior to therapeutic compression application Date Initiated: 12/28/2015 Goal Status: Active Interventions: Assess peripheral edema status every visit. Provide education on venous insufficiency Notes: Bianchini, Ahron D. ([id number]) Wound/Skin Impairment Nursing Diagnoses: Impaired tissue integrity Knowledge deficit related to smoking impact on wound healing Knowledge deficit related to ulceration/compromised skin integrity Goals: Patient/caregiver will  verbalize understanding of skin care regimen Date Initiated: 12/28/2015 Goal Status: Active Ulcer/skin breakdown will have a volume reduction of 30% by week 4 Date Initiated: 12/28/2015 Goal Status: Active Ulcer/skin breakdown will have a volume reduction of 50% by week 8 Date Initiated:  12/28/2015 Goal Status: Active Ulcer/skin breakdown will have a volume reduction of 80% by week 12 Date Initiated: 12/28/2015 Goal Status: Active Ulcer/skin breakdown will heal within 14 weeks Date Initiated: 12/28/2015 Goal Status: Active Interventions: Assess patient/caregiver ability to obtain necessary supplies Assess patient/caregiver ability to perform ulcer/skin care regimen upon admission and as needed Assess ulceration(s) every visit Provide education on ulcer and skin care Notes: Electronic Signature(s) Signed: 02/22/2016 4:07:00 PM By: Elpidio Eric BSN, RN Entered By: Elpidio Eric on 02/22/2016 10:59:19 Faraone, Hondo D. ([id number]) -------------------------------------------------------------------------------- Pain Assessment Details Patient Name: Tyler Milo D. Date of Service: 02/22/2016 10:45 AM Medical Record Number: [id number] Patient Account Number: [id number] Date of Birth/Sex: Nov 28, 1955 (60 y.o. Male) Treating RN: Clover Mealy, RN, BSN, [place] Sink Primary Care Physician: Joen Laura Other Clinician: Referring Physician: Joen Laura Treating Physician/Extender: [name] Weeks in Treatment: 8 Active Problems Location of Pain Severity and Description of Pain Patient Has Paino No Site Locations Pain Management and Medication Current Pain Management: Electronic Signature(s) Signed: 02/22/2016 4:07:00 PM By: Elpidio Eric BSN, RN Entered By: Elpidio Eric on 02/22/2016 10:55:06 Rippeon, Tyler Fells ([id number]) -------------------------------------------------------------------------------- Patient/Caregiver Education Details Patient Name: Tyler Milo D. Date of Service: 02/22/2016 10:45 AM Medical Record Number: [id number] Patient Account Number: [id number] Date of Birth/Gender: 13-Oct-1956 (60 y.o. Male) Treating RN: Clover Mealy, RN, BSN, [place] Sink Primary Care Physician: Joen Laura Other Clinician: Referring Physician: Joen Laura Treating Physician/Extender: Altamese [place] in Treatment: 8 Education Assessment Education Provided To: Patient Education Topics Provided Basic Hygiene: Methods: Explain/Verbal Responses: State content correctly Safety: Methods: Explain/Verbal Responses: State content correctly Venous: Methods: Explain/Verbal Responses: State content correctly Welcome To The Wound Care Center: Methods: Explain/Verbal Responses: State content correctly Wound/Skin Impairment: Methods: Explain/Verbal Responses: State content correctly Electronic Signature(s) Signed: 02/22/2016 4:07:00 PM By: Elpidio Eric BSN, RN Entered By: Elpidio Eric on 02/22/2016 11:00:07 Antwi, [place] D. ([id number]) -------------------------------------------------------------------------------- Wound Assessment Details Patient Name: [name], [name] Date of Service: 02/22/2016 10:45 AM Medical Record Number: [id number] Patient Account Number: [id number] Date of Birth/Sex: 1956-06-28 (60 y.o. Male) Treating RN: Afful, RN, BSN, [place] Sink Primary Care Physician: Joen Laura Other Clinician: Referring Physician: Joen Laura Treating Physician/Extender: [name] Weeks in Treatment: 8 Wound Status Wound Number: 2 Primary Diabetic Wound/Ulcer of the Lower Etiology: Extremity Wound Location: Left Foot - Plantar Wound Open Wounding Event: Gradually Appeared Status: Date Acquired: 12/21/2014 Comorbid Anemia, Chronic Obstructive Pulmonary Weeks Of Treatment: 8 History: Disease (COPD), Arrhythmia, Clustered Wound: No Congestive Heart Failure, Coronary Artery Disease, Hypertension, Type II Diabetes, Neuropathy Photos Photo Uploaded By: Elpidio Eric on 02/22/2016 15:39:00 Wound Measurements Length: (cm) 2 Width: (cm) 2 Depth: (cm) 0.2 Area: (cm) 3.142 Volume: (cm) 0.628 % Reduction in Area: -300.3% % Reduction in Volume: -166.1% Epithelialization: Medium (34-66%) Tunneling: No Wound Description Classification: Grade 1 Wound Margin: Distinct,  outline attached [name], [name] ([id number]) Foul Odor After Cleansing: No Exudate Amount: Medium Exudate Type: Serosanguineous Exudate Color: red, brown Wound Bed Granulation Amount: Medium (34-66%) Exposed Structure Granulation Quality: Pink Fascia Exposed: No Necrotic Amount: None Present (0%) Fat Layer Exposed: No Tendon Exposed: No Muscle Exposed: No Joint Exposed: No Bone Exposed: No Limited to Skin Breakdown Periwound Skin Texture Texture Color No Abnormalities Noted: No No Abnormalities Noted:  No Callus: Yes Atrophie Blanche: No Crepitus: No Cyanosis: No Excoriation: No Ecchymosis: No Fluctuance: No Erythema: No Friable: No Hemosiderin Staining: No Induration: No Mottled: No Localized Edema: No Pallor: No Rash: No Rubor: No Scarring: No Temperature / Pain Moisture Temperature: No Abnormality No Abnormalities Noted: No Dry / Scaly: No Maceration: Yes Moist: Yes Wound Preparation Ulcer Cleansing: Rinsed/Irrigated with Saline Topical Anesthetic Applied: Other: lidocaine 4%, Electronic Signature(s) Signed: 02/22/2016 4:07:00 PM By: Elpidio Eric BSN, RN Entered By: Elpidio Eric on 02/22/2016 10:57:51 [name], [name] ([id number]) -------------------------------------------------------------------------------- Vitals Details Patient Name: Tyler Milo D. Date of Service: 02/22/2016 10:45 AM Medical Record Number: [id number] Patient Account Number: [id number] Date of Birth/Sex: 09-Mar-1956 (60 y.o. Male) Treating RN: Afful, RN, BSN, [place] Sink Primary Care Physician: Joen Laura Other Clinician: Referring Physician: Joen Laura Treating Physician/Extender: Altamese [place] in Treatment: 8 Vital Signs Time Taken: 10:59 Temperature (F): 97.4 Height (in): 67 Pulse (bpm): 66 Weight (lbs): 195 Respiratory Rate (breaths/min): 18 Body Mass Index (BMI): 30.5 Blood Pressure (mmHg): 125/71 Reference Range: 80 - 120 mg / dl Electronic Signature(s) Signed:  02/22/2016 4:07:00 PM By: Elpidio Eric BSN, RN Entered By: Elpidio Eric on 02/22/2016 11:00:28

## 2016-02-29 ENCOUNTER — Encounter: Payer: Medicare Other | Admitting: Internal Medicine

## 2016-02-29 DIAGNOSIS — E11621 Type 2 diabetes mellitus with foot ulcer: Secondary | ICD-10-CM | POA: Diagnosis not present

## 2016-03-02 NOTE — Progress Notes (Signed)
[name], [name] ([id number]) Visit Report for 02/29/2016 Chief Complaint Document Details Patient Name: [name], [name] Date of Service: 02/29/2016 10:45 AM Medical Record Patient Account Number: [id number] Number: Treating RN: Clover MealyAfful, RN, BSN, Rita 05-23-1956 [phone](60 y.o. Other Clinician: Date of Birth/Sex: Male) Treating [name] Primary Care Physician: Joen LauraBLISS, LAURA Physician/Extender: G Referring Physician: Tedra SenegalBLISS, LAURA Weeks in Treatment: 9 Information Obtained from: Patient Chief Complaint Patient is here for review of a wound on his left first metatarsal head that is been there for the last 6 months Electronic Signature(s) Signed: 03/01/2016 5:01:27 PM By: Baltazar Najjarobson, Terreon MD Entered By: Baltazar Najjarobson, Gurnie on 02/29/2016 10:56:35 [name], [name] ([id number]) -------------------------------------------------------------------------------- Debridement Details Patient Name: [name], [name] Date of Service: 02/29/2016 10:45 AM Medical Record Patient Account Number: [id number] Number: Treating RN: Clover MealyAfful, RN, BSN, Rita 05-23-1956 [phone](60 y.o. Other Clinician: Date of Birth/Sex: Male) Treating [name] Primary Care Physician: Joen LauraBLISS, LAURA Physician/Extender: G Referring Physician: Tedra SenegalBLISS, LAURA Weeks in Treatment: 9 Debridement Performed for Wound #2 Left,Plantar Foot Assessment: Performed By: Physician Maxwell CaulOBSON, Faysal G, MD Debridement: Debridement Pre-procedure Yes Verification/Time Out Taken: Start Time: 10:46 Pain Control: Lidocaine 4% Topical Solution Level: Skin/Subcutaneous Tissue Total Area Debrided (L x 1 (cm) x 1 (cm) = 1 (cm) W): Tissue and other Non-Viable, Fibrin/Slough, Subcutaneous material debrided: Instrument: Curette Bleeding: Minimum Hemostasis Achieved: Pressure End Time: 10:50 Procedural Pain: 0 Post Procedural Pain: 0 Response to Treatment: Procedure was tolerated well Post Debridement Measurements of Total Wound Length: (cm)  1 Width: (cm) 1 Depth: (cm) 0.1 Volume: (cm) 0.079 Post Procedure Diagnosis Same as Pre-procedure Electronic Signature(s) Signed: 03/01/2016 3:02:50 PM By: Elpidio EricAfful, Rita BSN, RN Signed: 03/01/2016 5:01:27 PM By: Baltazar Najjarobson, Nas MD Entered By: Baltazar Najjarobson, Breckan on 02/29/2016 10:56:17 [name], [name] ([id number]) -------------------------------------------------------------------------------- HPI Details Patient Name: [name], [name] Date of Service: 02/29/2016 10:45 AM Medical Record Patient Account Number: [id number] Number: Treating RN: Clover MealyAfful, RN, BSN, Rita 05-23-1956 [phone](60 y.o. Other Clinician: Date of Birth/Sex: Male) Treating [name] Primary Care Physician: Joen LauraBLISS, LAURA Physician/Extender: G Referring Physician: Tedra SenegalBLISS, LAURA Weeks in Treatment: 9 History of Present Illness HPI Description: 12/28/15. This patient is a type II diabetic on insulin. He had vascular interventions 2 weeks ago on 1/17. This included an angioplasty of the left superficial femoral and popliteal arteries as well as a percutaneous transluminal angioplasty of the left posterior tibial artery. Nevertheless he is here for a plantar wound that the patient says is been there for the last 6 months. He initially told us that he is applying Neosporin to this however he tells me also that he has been in a nursing home for the last year. I found that to be a somewhat on usual combination of fax. In any case the patient is a diabetic with diabetic PAD. 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 His ABI on the left side today was 0.62 01/11/16; we have been using Prisma to the wound areas on these first metatarsal head on the left and the small wound on his left anterior ankle. He states that the area on his left metatarsal head was not changed all week, this might account for why there is so much maceration here. In the meantime he tells me he has fallen and hurt his  shoulder x-ray is negative. He is also complaining of shortness of breath 01/18/16; the area on his left anterior lower leg/ankle is resolved. The area over his first metatarsal head  looks better. There is less maceration tissue here. Surgical debridement to remove surface slough and nonviable subcutaneous tissue however this generally appears better. 01/25/16; patient arrives today with several areas which are small and superficial on the left leg. This may be from scratching which he admittedly does. The area over the left first plantar metatarsal head looks improved. There is no maceration surrounding skin looks normal. There is some epithelialization attempting to get over this wound. 02/01/16 the areas on the left leg have closed down. The area over the first plantar metatarsal head on the left looks stable to improved although there is some maceration around this. Switch to silver alginate/Aquacel Ag 02/08/16; the areas on his legs remain closed down. The area over the first plantar metatarsal head on the left has advancing epithelialization with only a small open area remaining. Patient always look short of breath when he comes to our clinic but he states his wheezing is improved. We switched to silver alginate last week due to maceration around the wound 4/4the area on his leg remains closed. The area over the left first plantar metatarsal head is fully epithelialized. Patient tells me he has new shoes coming for both areas prosthetic leg on the right than the draining fluid on the left, this wound is certainly not ready for that type of force as of yet 02/22/16; the left first plantar metatarsal head has again split wide open which is exceptionally disappointing. Macerated surrounding skin debridement with subcutaneous tissue. Patient states he was exercising on an exercise bike, this friction over this area may of reopened this. 02/29/16 left first metatarsal head wound is improved. Requires  debridement, I removed some macerated surrounding skin nonviable surface slough. It appears better Electronic Signature(s) SAYAN, ALDAVA ([id number]) Signed: 03/01/2016 5:01:27 PM By: Baltazar Najjar MD Entered By: Baltazar Najjar on 02/29/2016 10:57:18 Buley, [name] ([id number]) -------------------------------------------------------------------------------- Physical Exam Details Patient Name: [name], [name] Date of Service: 02/29/2016 10:45 AM Medical Record Patient Account Number: [id number] Number: Treating RN: Clover Mealy, RN, BSN, Rita 09-16-1956 [phone] y.o. Other Clinician: Date of Birth/Sex: Male) Treating Shalise Rosado, Joachim Primary Care Physician: Joen Laura Physician/Extender: G Referring Physician: Tedra Senegal in Treatment: 9 Notes Wound exam; the area over the first metatarsal head is debridement. Now about the size of a dime but with healthy granulation. There is surrounding skin also appears to be adequate. Electronic Signature(s) Signed: 03/01/2016 5:01:27 PM By: Baltazar Najjar MD Entered By: Baltazar Najjar on 02/29/2016 10:57:54 Diloreto, [name] ([id number]) -------------------------------------------------------------------------------- Physician Orders Details Patient Name: Tyler Milo D. Date of Service: 02/29/2016 10:45 AM Medical Record Patient Account Number: [id number] Number: Treating RN: Clover Mealy, RN, BSN, Rita [birth year] [phone] y.o. Other Clinician: Date of Birth/Sex: Male) Treating Allan Bacigalupi, Stiles Primary Care Physician: Joen Laura Physician/Extender: G Referring Physician: Tedra Senegal in Treatment: 9 Verbal / Phone Orders: Yes Clinician: Afful, RN, BSN, Rita Read Back and Verified: Yes Diagnosis Coding Wound Cleansing Wound #2 Left,Plantar Foot o Clean wound with Normal Saline. Anesthetic Wound #2 Left,Plantar Foot o Topical Lidocaine 4% cream applied to wound bed prior to debridement Skin Barriers/Peri-Wound  Care Wound #2 Left,Plantar Foot o Skin Prep Primary Wound Dressing Wound #2 Left,Plantar Foot o Aquacel Ag Secondary Dressing Wound #2 Left,Plantar Foot o Boardered Foam Dressing Dressing Change Frequency Wound #2 Left,Plantar Foot o Change dressing every other day. Follow-up Appointments Wound #2 Left,Plantar Foot o Return Appointment in 1 week. Off-Loading Wound #2 Left,Plantar Foot o Open toe surgical shoe with peg  assist. Malacara, Bradie D. ([id number]) Additional Orders / Instructions Wound #2 Left,Plantar Foot o Increase protein intake. o Activity as tolerated Electronic Signature(s) Signed: 03/01/2016 3:02:50 PM By: Elpidio Eric BSN, RN Signed: 03/01/2016 5:01:27 PM By: Baltazar Najjar MD Entered By: Elpidio Eric on 02/29/2016 10:51:32 Yandow, [name] ([id number]) -------------------------------------------------------------------------------- Problem List Details Patient Name: [name], [name] Date of Service: 02/29/2016 10:45 AM Medical Record Patient Account Number: [id number] Number: Treating RN: Clover Mealy, RN, BSN, Rita 1956-06-08 [phone] y.o. 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 ([id number]) -------------------------------------------------------------------------------- Progress Note  Details Patient Name: Tyler Milo D. Date of Service: 02/29/2016 10:45 AM Medical Record Patient Account Number: [id number] Number: Treating RN: Clover Mealy, RN, BSN, Rita Mar 31, 1956 [phone] y.o. Other Clinician: Date of Birth/Sex: Male) Treating Lizette Pazos, Jigar Primary Care Physician: Joen Laura Physician/Extender: G Referring Physician: Tedra Senegal in Treatment: 9 Subjective Chief Complaint Information obtained from Patient Patient is here for review of a wound on his left first metatarsal head that is been there for the last 6 months History of Present Illness (HPI) 12/28/15. This patient is a type II diabetic on insulin. He had vascular interventions 2 weeks ago on 1/17. This included an angioplasty of the left superficial femoral and popliteal arteries as well as a percutaneous transluminal angioplasty of the left posterior tibial artery. Nevertheless he is here for a plantar wound that the patient says is been there for the last 6 months. He initially told us that he is applying Neosporin to this however he tells me also that he has been in a nursing home for the last year. I found that to be a somewhat on usual combination of fax. In any case the patient is a diabetic with diabetic PAD. 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 His ABI on the left side today was 0.62 01/11/16; we have been using Prisma to the wound areas on these first metatarsal head on the left and the small wound on his left anterior ankle. He states that the area on his left metatarsal head was not changed all week, this might account for why there is so much maceration here. In the meantime he tells me he has fallen and hurt his shoulder x-ray is negative. He is also complaining of shortness of breath 01/18/16; the area on his left anterior lower leg/ankle is resolved. The area over his first metatarsal head looks better. There is less maceration tissue here. Surgical  debridement to remove surface slough and nonviable subcutaneous tissue however this generally appears better. 01/25/16; patient arrives today with several areas which are small and superficial on the left leg. This may be from scratching which he admittedly does. The area over the left first plantar metatarsal head looks improved. There is no maceration surrounding skin looks normal. There is some epithelialization attempting to get over this wound. 02/01/16 the areas on the left leg have closed down. The area over the first plantar  metatarsal head on the left looks stable to improved although there is some maceration around this. Switch to silver alginate/Aquacel Ag 02/08/16; the areas on his legs remain closed down. The area over the first plantar metatarsal head on the left has advancing epithelialization with only a small open area remaining. Patient always look short of breath when he comes to our clinic but he states his wheezing is improved. We switched to silver alginate last week due to maceration around the wound 4/4the area on his leg remains closed. The area over the left first plantar metatarsal head is fully epithelialized. Patient tells me he has new shoes coming for both areas prosthetic leg on the right than the draining fluid on the left, this wound is certainly not ready for that type of force as of yet 02/22/16; the left first plantar metatarsal head has again split wide open which is exceptionally disappointing. Macerated surrounding skin debridement with subcutaneous tissue. Patient states he was exercising on an exercise bike, this friction over this area may of reopened this. [name], [name] ([id number]) 02/29/16 left first metatarsal head wound is improved. Requires debridement, I removed some macerated surrounding skin nonviable surface slough. It appears better Objective Constitutional Vitals Time Taken: 10:42 AM, Height: 67 in, Weight: 195 lbs, BMI: 30.5, Temperature:  98.2 F, Pulse: 64 bpm, Respiratory Rate: 18 breaths/min, Blood Pressure: 129/94 mmHg. Integumentary (Hair, Skin) Wound #2 status is Open. Original cause of wound was Gradually Appeared. The wound is located on the Left,Plantar Foot. The wound measures 1cm length x 1cm width x 0.1cm depth; 0.785cm^2 area and 0.079cm^3 volume. The wound is limited to skin breakdown. There is no tunneling or undermining noted. There is a medium amount of serosanguineous drainage noted. The wound margin is distinct with the outline attached to the wound base. There is medium (34-66%) pink granulation within the wound bed. There is no necrotic tissue within the wound bed. The periwound skin appearance exhibited: Callus, Maceration, Moist. The periwound skin appearance did not exhibit: Crepitus, Excoriation, Fluctuance, Friable, Induration, Localized Edema, Rash, Scarring, Dry/Scaly, Atrophie Blanche, Cyanosis, Ecchymosis, Hemosiderin Staining, Mottled, Pallor, Rubor, Erythema. Periwound temperature was noted as No Abnormality. Assessment Active Problems ICD-10 L97.522 - Non-pressure chronic ulcer of other part of left foot with fat layer exposed E11.621 - Type 2 diabetes mellitus with foot ulcer E11.51 - Type 2 diabetes mellitus with diabetic peripheral angiopathy without gangrene Procedures Wound #2 Wound #2 is a Diabetic Wound/Ulcer of the Lower Extremity located on the Left,Plantar Foot . There was a Skin/Subcutaneous Tissue Debridement ([id number]) debridement with total area of 1 sq cm performed [name], [name] ([id number]) by [name], MD. with the following instrument(s): Curette to remove Non-Viable tissue/material including Fibrin/Slough and Subcutaneous after achieving pain control using Lidocaine 4% Topical Solution. A time out was conducted prior to the start of the procedure. A Minimum amount of bleeding was controlled with Pressure. The procedure was tolerated well with a pain level of  0 throughout and a pain level of 0 following the procedure. Post Debridement Measurements: 1cm length x 1cm width x 0.1cm depth; 0.079cm^3 volume. Post procedure Diagnosis Wound #2: Same as Pre-Procedure Plan Wound Cleansing: Wound #2 Left,Plantar Foot: Clean wound with Normal Saline. Anesthetic: Wound #2 Left,Plantar Foot: Topical Lidocaine 4% cream applied to wound bed prior to debridement Skin Barriers/Peri-Wound Care: Wound #2 Left,Plantar Foot: Skin Prep Primary Wound Dressing: Wound #2 Left,Plantar Foot: Aquacel Ag Secondary Dressing: Wound #2 Left,Plantar Foot: Boardered Foam Dressing Dressing Change Frequency:  Wound #2 Left,Plantar Foot: Change dressing every other day. Follow-up Appointments: Wound #2 Left,Plantar Foot: Return Appointment in 1 week. Off-Loading: Wound #2 Left,Plantar Foot: Open toe surgical shoe with peg assist. Additional Orders / Instructions: Wound #2 Left,Plantar Foot: Increase protein intake. Activity as tolerated [name], [name] ([id number]) #1 deterioration last week was no doubt secondary to using the exercise bike daily. #2 wound actually looks better today tissue is healthy. Macerated tissue removed fibrinous slough to reveal a healthy wound base. #3 still the same dressing Aquacel Ag and border foam change every second day #4 no evidence of infection Electronic Signature(s) Signed: 03/01/2016 5:01:27 PM By: Baltazar Najjar MD Entered By: Baltazar Najjar on 02/29/2016 10:58:49 [name], [name] ([id number]) -------------------------------------------------------------------------------- SuperBill Details Patient Name: Tyler Milo D. Date of Service: 02/29/2016 Medical Record Patient Account Number: [id number] Number: Treating RN: Clover Mealy, RN, BSN, Rita 1956/05/07 [phone] y.o. Other Clinician: Date of Birth/Sex: Male) Treating Olanda Boughner, Hashir Primary Care Physician: Joen Laura Physician/Extender: G Referring Physician: Tedra [place] in Treatment: 9 Diagnosis Coding ICD-10 Codes Code Description [id number] Non-pressure chronic ulcer of other part of left foot with fat layer exposed E11.621 Type 2 diabetes mellitus with foot ulcer E11.51 Type 2 diabetes mellitus with diabetic peripheral angiopathy without gangrene Facility Procedures CPT4 Code Description: [id number] 11042 - DEB SUBQ TISSUE 20 SQ CM/< ICD-10 Description Diagnosis L97.522 Non-pressure chronic ulcer of other part of left foot Modifier: with fat lay Quantity: 1 er exposed Physician Procedures CPT4 Code Description: [id number] 11042 - WC PHYS SUBQ TISS 20 SQ CM ICD-10 Description Diagnosis L97.522 Non-pressure chronic ulcer of other part of left foot Modifier: with fat laye Quantity: 1 r exposed Electronic Signature(s) Signed: 03/01/2016 5:01:27 PM By: Baltazar Najjar MD Entered By: Baltazar Najjar on 02/29/2016 10:59:10

## 2016-03-02 NOTE — Progress Notes (Signed)
[name], [name] ([id number]) Visit Report for 02/29/2016 Arrival Information Details Patient Name: [name], [name] Date of Service: 02/29/2016 10:45 AM Medical Record Number: [id number] Patient Account Number: [id number] Date of Birth/Sex: 1956/09/28 [age] y.o. Male) Treating RN: Clover MealyAfful, RN, BSN, [place] Sink[place] Primary Care Physician: Joen LauraBLISS, LAURA Other Clinician: Referring Physician: Joen LauraBLISS, LAURA Treating Physician/Extender: [name] Weeks in Treatment: 9 Visit Information History Since Last Visit Added or deleted any medications: No Patient Arrived: Wheel Chair Any new allergies or adverse reactions: No Arrival Time: 10:40 Had a fall or experienced change in No activities of daily living that may affect Accompanied By: self risk of falls: Transfer Assistance: None Signs or symptoms of abuse/neglect since last No Patient Identification Verified: Yes visito Secondary Verification Process Yes Hospitalized since last visit: No Completed: Has Dressing in Place as Prescribed: Yes Patient Requires Transmission-Based No Pain Present Now: No Precautions: Patient Has Alerts: No Electronic Signature(s) Signed: 03/01/2016 3:02:50 PM By: Elpidio EricAfful, Rita BSN, RN Entered By: Elpidio EricAfful, Rita on 02/29/2016 10:40:33 [name], [name] ([id number]) -------------------------------------------------------------------------------- Encounter Discharge Information Details Patient Name: [name], [name] Date of Service: 02/29/2016 10:45 AM Medical Record Number: [id number] Patient Account Number: [id number] Date of Birth/Sex: 1956/09/28 [age] y.o. Male) Treating RN: Clover MealyAfful, RN, BSN, [place] Sinkita Primary Care Physician: Joen LauraBLISS, LAURA Other Clinician: Referring Physician: Joen LauraBLISS, LAURA Treating Physician/Extender: [name] Weeks in Treatment: 9 Encounter Discharge Information Items Discharge Pain Level: 0 Discharge Condition: Stable Ambulatory Status: Wheelchair Discharge Destination: Home Transportation:  Other Accompanied By: self Schedule Follow-up Appointment: No Medication Reconciliation completed and provided to Patient/Care No Evarose [place]: Provided on Clinical Summary of Care: 02/29/2016 Form Type Recipient Paper Patient [facility] Electronic Signature(s) Signed: 02/29/2016 4:32:02 PM By: Elpidio EricAfful, Rita BSN, RN Previous Signature: 02/29/2016 10:55:55 AM Version By: Gwenlyn PerkingMoore, Shelia Entered By: Elpidio EricAfful, Rita on 02/29/2016 16:32:02 [name], [name] ([id number]) -------------------------------------------------------------------------------- Lower Extremity Assessment Details Patient Name: [name], [name] Date of Service: 02/29/2016 10:45 AM Medical Record Number: [id number] Patient Account Number: [id number] Date of Birth/Sex: 1956/09/28 [age] y.o. Male) Treating RN: Clover MealyAfful, RN, BSN, [place] Sinkita Primary Care Physician: Joen LauraBLISS, LAURA Other Clinician: Referring Physician: Joen LauraBLISS, LAURA Treating Physician/Extender: [name] Weeks in Treatment: 9 Edema Assessment Assessed: [Left: No] [Right: No] E[Left: dema] [Right: :] Calf Left: Right: Point of Measurement: 34 cm From Medial Instep cm cm Ankle Left: Right: Point of Measurement: 8 cm From Medial Instep cm cm Vascular Assessment Pulses: Posterior Tibial Dorsalis Pedis Palpable: [Left:Yes] Extremity colors, hair growth, and conditions: Extremity Color: [Left:Mottled] Hair Growth on Extremity: [Left:No] Temperature of Extremity: [Left:Warm] Capillary Refill: [Left:< 3 seconds] Toe Nail Assessment Left: Right: Thick: Yes Discolored: Yes Deformed: No Improper Length and Hygiene: Yes Electronic Signature(s) Signed: 02/29/2016 4:30:35 PM By: Elpidio EricAfful, Rita BSN, RN Entered By: Elpidio EricAfful, Rita on 02/29/2016 16:30:34 [name], [name] ([id number]) -------------------------------------------------------------------------------- Multi Wound Chart Details Patient Name: [name], [name] Date of Service: 02/29/2016 10:45 AM Medical Record Number:  [id number] Patient Account Number: [id number] Date of Birth/Sex: 1956/09/28 [age] y.o. Male) Treating RN: Clover MealyAfful, RN, BSN, [place] Sinkita Primary Care Physician: Joen LauraBLISS, LAURA Other Clinician: Referring Physician: Joen LauraBLISS, LAURA Treating Physician/Extender: [name] Weeks in Treatment: 9 Vital Signs Height(in): 67 Pulse(bpm): 64 Weight(lbs): 195 Blood Pressure 129/94 (mmHg): Body Mass Index(BMI): 31 Temperature(F): 98.2 Respiratory Rate 18 (breaths/min): Photos: [2:No Photos] [N/A:N/A] Wound Location: [2:Left Foot - Plantar] [N/A:N/A] Wounding Event: [2:Gradually Appeared] [N/A:N/A] Primary Etiology: [2:Diabetic Wound/Ulcer of the Lower Extremity] [N/A:N/A] Comorbid History: [2:Anemia, Chronic Obstructive Pulmonary Disease (COPD), Arrhythmia, Congestive Heart Failure, Coronary Artery Disease, Hypertension,  Type II Diabetes, Neuropathy] [N/A:N/A] Date Acquired: [2:12/21/2014] [N/A:N/A] Weeks of Treatment: [2:9] [N/A:N/A] Wound Status: [2:Open] [N/A:N/A] Measurements L x W x D 1x1x0.1 [N/A:N/A] (cm) Area (cm) : [2:0.785] [N/A:N/A] Volume (cm) : [2:0.079] [N/A:N/A] % Reduction in Area: [2:0.00%] [N/A:N/A] % Reduction in Volume: 66.50% [N/A:N/A] Classification: [2:Grade 1] [N/A:N/A] Exudate Amount: [2:Medium] [N/A:N/A] Exudate Type: [2:Serosanguineous] [N/A:N/A] Exudate Color: [2:red, brown] [N/A:N/A] Wound Margin: [2:Distinct, outline attached] [N/A:N/A] Granulation Amount: [2:Medium (34-66%)] [N/A:N/A] Granulation Quality: [2:Pink] [N/A:N/A] Necrotic Amount: [2:None Present (0%)] [N/A:N/A] Exposed Structures: Fascia: No N/A N/A Fat: No Tendon: No Muscle: No Joint: No Bone: No Limited to Skin Breakdown Epithelialization: Medium (34-66%) N/A N/A Periwound Skin Texture: Callus: Yes N/A N/A Edema: No Excoriation: No Induration: No Crepitus: No Fluctuance: No Friable: No Rash: No Scarring: No Periwound Skin Maceration: Yes N/A N/A Moisture: Moist: Yes Dry/Scaly:  No Periwound Skin Color: Atrophie Blanche: No N/A N/A Cyanosis: No Ecchymosis: No Erythema: No Hemosiderin Staining: No Mottled: No Pallor: No Rubor: No Temperature: No Abnormality N/A N/A Tenderness on No N/A N/A Palpation: Wound Preparation: Ulcer Cleansing: N/A N/A Rinsed/Irrigated with Saline Topical Anesthetic Applied: Other: lidocaine 4% Treatment Notes Electronic Signature(s) Signed: 03/01/2016 3:02:50 PM By: Elpidio Eric BSN, RN Entered By: Elpidio Eric on 02/29/2016 10:50:21 Stemler, [name] ([id number]) -------------------------------------------------------------------------------- Multi-Disciplinary Care Plan Details Patient Name: Tyler Milo D. Date of Service: 02/29/2016 10:45 AM Medical Record Number: [id number] Patient Account Number: [id number] Date of Birth/Sex: 1956/10/05 (60 y.o. Male) Treating RN: Clover Mealy, RN, BSN, [place] Sink Primary Care Physician: Joen Laura Other Clinician: Referring Physician: Joen Laura Treating Physician/Extender: Tyler [employer] in Treatment: 9 Active Inactive Abuse / Safety / Falls / Self Care Management Nursing Diagnoses: Impaired home maintenance Impaired physical mobility Knowledge deficit related to: safety; personal, health (wound), emergency Potential for falls Self care deficit: actual or potential Goals: Patient will remain injury free Date Initiated: 12/28/2015 Goal Status: Active Patient/caregiver will verbalize understanding of skin care regimen Date Initiated: 12/28/2015 Goal Status: Active Patient/caregiver will verbalize/demonstrate measure taken to improve self care Date Initiated: 12/28/2015 Goal Status: Active Patient/caregiver will verbalize/demonstrate measures taken to improve the patient's personal safety Date Initiated: 12/28/2015 Goal Status: Active Patient/caregiver will verbalize/demonstrate measures taken to prevent injury and/or falls Date Initiated: 12/28/2015 Goal Status:  Active Patient/caregiver will verbalize/demonstrate understanding of what to do in case of emergency Date Initiated: 12/28/2015 Goal Status: Active Interventions: Assess fall risk on admission and as needed Assess: immobility, friction, shearing, incontinence upon admission and as needed Assess impairment of mobility on admission and as needed per policy Assess self care needs on admission and as needed Provide education on basic hygiene SHREYAS, PIATKOWSKI ([id number]) Provide education on fall prevention Provide education on personal and home safety Provide education on safe transfers Treatment Activities: Education provided on Basic Hygiene : 12/28/2015 Notes: Orientation to the Wound Care Program Nursing Diagnoses: Knowledge deficit related to the wound healing center program Goals: Patient/caregiver will verbalize understanding of the Wound Healing Center Program Date Initiated: 12/28/2015 Goal Status: Active Interventions: Provide education on orientation to the wound center Notes: Venous Leg Ulcer Nursing Diagnoses: Knowledge deficit related to disease process and management Potential for venous Insuffiency (use before diagnosis confirmed) Goals: Non-invasive venous studies are completed as ordered Date Initiated: 12/28/2015 Goal Status: Active Patient/caregiver will verbalize understanding of disease process and disease management Date Initiated: 12/28/2015 Goal Status: Active Verify adequate tissue perfusion prior to therapeutic compression application Date Initiated: 12/28/2015 Goal Status: Active Interventions: Assess peripheral edema status every visit. Provide  education on venous insufficiency Notes: [name], Larz D. ([id number]) Wound/Skin Impairment Nursing Diagnoses: Impaired tissue integrity Knowledge deficit related to smoking impact on wound healing Knowledge deficit related to ulceration/compromised skin integrity Goals: Patient/caregiver will verbalize  understanding of skin care regimen Date Initiated: 12/28/2015 Goal Status: Active Ulcer/skin breakdown will have a volume reduction of 30% by week 4 Date Initiated: 12/28/2015 Goal Status: Active Ulcer/skin breakdown will have a volume reduction of 50% by week 8 Date Initiated: 12/28/2015 Goal Status: Active Ulcer/skin breakdown will have a volume reduction of 80% by week 12 Date Initiated: 12/28/2015 Goal Status: Active Ulcer/skin breakdown will heal within 14 weeks Date Initiated: 12/28/2015 Goal Status: Active Interventions: Assess patient/caregiver ability to obtain necessary supplies Assess patient/caregiver ability to perform ulcer/skin care regimen upon admission and as needed Assess ulceration(s) every visit Provide education on ulcer and skin care Notes: Electronic Signature(s) Signed: 03/01/2016 3:02:50 PM By: Elpidio Eric BSN, RN Entered By: Elpidio Eric on 02/29/2016 10:50:13 Siler, Coner D. ([id number]) -------------------------------------------------------------------------------- Pain Assessment Details Patient Name: Tyler Milo D. Date of Service: 02/29/2016 10:45 AM Medical Record Number: [id number] Patient Account Number: [id number] Date of Birth/Sex: 03-05-1956 (60 y.o. Male) Treating RN: Clover Mealy, RN, BSN, [place] Sink Primary Care Physician: Joen Laura Other Clinician: Referring Physician: Joen Laura Treating Physician/Extender: Tyler [place] in Treatment: 9 Active Problems Location of Pain Severity and Description of Pain Patient Has Paino No Site Locations Pain Management and Medication Current Pain Management: Electronic Signature(s) Signed: 03/01/2016 3:02:50 PM By: Elpidio Eric BSN, RN Entered By: Elpidio Eric on 02/29/2016 10:41:51 Shoff, [name] ([id number]) -------------------------------------------------------------------------------- Patient/Caregiver Education Details Patient Name: Tyler Milo D. Date of Service: 02/29/2016 10:45 AM Medical  Record Number: [id number] Patient Account Number: [id number] Date of Birth/Gender: 12/28/1955 (60 y.o. Male) Treating RN: Clover Mealy, RN, BSN, [place] Sink Primary Care Physician: Joen Laura Other Clinician: Referring Physician: Joen Laura Treating Physician/Extender: Tyler [place] in Treatment: 9 Education Assessment Education Provided To: Patient Education Topics Provided Basic Hygiene: Methods: Explain/Verbal Responses: State content correctly Safety: Methods: Explain/Verbal Responses: State content correctly Venous: Methods: Explain/Verbal Responses: State content correctly Welcome To The Wound Care Center: Methods: Explain/Verbal Responses: State content correctly Wound/Skin Impairment: Methods: Explain/Verbal Responses: State content correctly Electronic Signature(s) Signed: 02/29/2016 4:32:31 PM By: Elpidio Eric BSN, RN Entered By: Elpidio Eric on 02/29/2016 16:32:31 [name], [name] ([id number]) -------------------------------------------------------------------------------- Wound Assessment Details Patient Name: [name], [name] Date of Service: 02/29/2016 10:45 AM Medical Record Number: [id number] Patient Account Number: [id number] Date of Birth/Sex: July 29, 1956 (60 y.o. Male) Treating RN: Afful, RN, BSN, [place] Sink Primary Care Physician: Joen Laura Other Clinician: Referring Physician: Joen Laura Treating Physician/Extender: [name] Weeks in Treatment: 9 Wound Status Wound Number: 2 Primary Diabetic Wound/Ulcer of the Lower Etiology: Extremity Wound Location: Left Foot - Plantar Wound Open Wounding Event: Gradually Appeared Status: Date Acquired: 12/21/2014 Comorbid Anemia, Chronic Obstructive Pulmonary Weeks Of Treatment: 9 History: Disease (COPD), Arrhythmia, Clustered Wound: No Congestive Heart Failure, Coronary Artery Disease, Hypertension, Type II Diabetes, Neuropathy Photos Photo Uploaded By: Elpidio Eric on 02/29/2016 16:13:53 Wound  Measurements Length: (cm) 1 Width: (cm) 1 Depth: (cm) 0.1 Area: (cm) 0.785 Volume: (cm) 0.079 % Reduction in Area: 0% % Reduction in Volume: 66.5% Epithelialization: Medium (34-66%) Tunneling: No Undermining: No Wound Description Classification: Grade 1 Wound Margin: Distinct, outline attached Heenan, Goldie D. ([id number]) Foul Odor After Cleansing: No Exudate Amount: Medium Exudate Type: Serosanguineous Exudate Color: red, brown Wound Bed Granulation Amount: Medium (34-66%) Exposed Structure Granulation Quality: Pink Fascia Exposed: No  Necrotic Amount: None Present (0%) Fat Layer Exposed: No Tendon Exposed: No Muscle Exposed: No Joint Exposed: No Bone Exposed: No Limited to Skin Breakdown Periwound Skin Texture Texture Color No Abnormalities Noted: No No Abnormalities Noted: No Callus: Yes Atrophie Blanche: No Crepitus: No Cyanosis: No Excoriation: No Ecchymosis: No Fluctuance: No Erythema: No Friable: No Hemosiderin Staining: No Induration: No Mottled: No Localized Edema: No Pallor: No Rash: No Rubor: No Scarring: No Temperature / Pain Moisture Temperature: No Abnormality No Abnormalities Noted: No Dry / Scaly: No Maceration: Yes Moist: Yes Wound Preparation Ulcer Cleansing: Rinsed/Irrigated with Saline Topical Anesthetic Applied: Other: lidocaine 4%, Treatment Notes Wound #2 (Left, Plantar Foot) 1. Cleansed with: Clean wound with Normal Saline 4. Dressing Applied: Aquacel Ag 5. Secondary Dressing Applied Bordered Foam Dressing Notes Darco EGON, DITTUS ([id number]) Electronic Signature(s) Signed: 03/01/2016 3:02:50 PM By: Elpidio Eric BSN, RN Entered By: Elpidio Eric on 02/29/2016 10:47:26 Futch, [name] ([id number]) -------------------------------------------------------------------------------- Vitals Details Patient Name: Tyler Milo D. Date of Service: 02/29/2016 10:45 AM Medical Record Number: [id number] Patient Account Number:  [id number] Date of Birth/Sex: Jan 09, 1956 (60 y.o. Male) Treating RN: Afful, RN, BSN, [place] Sink Primary Care Physician: Joen Laura Other Clinician: Referring Physician: Joen Laura Treating Physician/Extender: Tyler [place] in Treatment: 9 Vital Signs Time Taken: 10:42 Temperature (F): 98.2 Height (in): 67 Pulse (bpm): 64 Weight (lbs): 195 Respiratory Rate (breaths/min): 18 Body Mass Index (BMI): 30.5 Blood Pressure (mmHg): 129/94 Reference Range: 80 - 120 mg / dl Electronic Signature(s) Signed: 03/01/2016 3:02:50 PM By: Elpidio Eric BSN, RN Entered By: Elpidio Eric on 02/29/2016 10:43:22

## 2016-03-07 ENCOUNTER — Encounter: Payer: Medicare Other | Admitting: Internal Medicine

## 2016-03-07 DIAGNOSIS — E11621 Type 2 diabetes mellitus with foot ulcer: Secondary | ICD-10-CM | POA: Diagnosis not present

## 2016-03-07 NOTE — Progress Notes (Addendum)
JOUD, INGWERSEN ([id number]) Visit Report for 03/07/2016 Arrival Information Details Patient Name: [name], [name]. Date of Service: 03/07/2016 12:45 PM Medical Record Number: [id number] Patient Account Number: [id number] Date of Birth/Sex: [birth year] (60 y.o. Male) Treating [name]: Clover Mealy, [name], Frank, [place] Sink Primary Care Physician: Joen Laura Other Clinician: Referring Physician: Joen Laura Treating Physician/Extender: Altamese [place] in Treatment: 10 Visit Information History Since Last Visit Added or deleted any medications: No Patient Arrived: Wheel Chair Any new allergies or adverse reactions: No Arrival Time: 12:52 Had a fall or experienced change in No activities of daily living that may affect Accompanied By: self risk of falls: Transfer Assistance: None Signs or symptoms of abuse/neglect since last No Patient Identification Verified: Yes visito Secondary Verification Process Yes Hospitalized since last visit: No Completed: Has Dressing in Place as Prescribed: Yes Patient Requires Transmission-Based No Pain Present Now: No Precautions: Patient Has Alerts: No Electronic Signature(s) Signed: 03/07/2016 12:52:53 PM By: Elpidio Eric BSN, [name] Entered By: Elpidio Eric on 03/07/2016 12:52:53 [place], Rollins Algis Downs ([id number]) -------------------------------------------------------------------------------- Encounter Discharge Information Details Patient Name: Tyler Milo D. Date of Service: 03/07/2016 12:45 PM Medical Record Number: [id number] Patient Account Number: [id number] Date of Birth/Sex: [birth year] (60 y.o. Male) Treating [name]: Clover Mealy, [name], Frank, [place] Sink Primary Care Physician: Joen Laura Other Clinician: Referring Physician: Joen Laura Treating Physician/Extender: Altamese [place] in Treatment: 10 Encounter Discharge Information Items Discharge Pain Level: 0 Discharge Condition: Stable Ambulatory Status: Wheelchair Discharge Destination: Nursing Home Transportation:  Other Accompanied By: self Schedule Follow-up Appointment: No Medication Reconciliation completed and provided to Patient/Care No Lakia Gritton: Provided on Clinical Summary of Care: 03/07/2016 Form Type Recipient Paper Patient [facility] Electronic Signature(s) Signed: 03/07/2016 1:25:21 PM By: Elpidio Eric BSN, [name] Previous Signature: 03/07/2016 1:17:09 PM Version By: Gwenlyn Perking Entered By: Elpidio Eric on 03/07/2016 13:25:20 [name], [name] ([id number]) -------------------------------------------------------------------------------- Lower Extremity Assessment Details Patient Name: Tyler Milo D. Date of Service: 03/07/2016 12:45 PM Medical Record Number: [id number] Patient Account Number: [id number] Date of Birth/Sex: [birth year] (60 y.o. Male) Treating [name]: Clover Mealy, [name], Frank, [place] Sink Primary Care Physician: Joen Laura Other Clinician: Referring Physician: Joen Laura Treating Physician/Extender: [name] Weeks in Treatment: 10 Edema Assessment Assessed: [Left: No] [Right: No] E[Left: dema] [Right: :] Calf Left: Right: Point of Measurement: 34 cm From Medial Instep cm cm Ankle Left: Right: Point of Measurement: 8 cm From Medial Instep cm cm Vascular Assessment Pulses: Posterior Tibial Extremity colors, hair growth, and conditions: Extremity Color: [Left:Mottled] Hair Growth on Extremity: [Left:No] Temperature of Extremity: [Left:Warm] Toe Nail Assessment Left: Right: Thick: Yes Discolored: Yes Deformed: No Improper Length and Hygiene: No Electronic Signature(s) Signed: 03/07/2016 4:09:11 PM By: Elpidio Eric BSN, [name] Entered By: Elpidio Eric on 03/07/2016 13:03:14 [name], [name] ([id number]) -------------------------------------------------------------------------------- Multi Wound Chart Details Patient Name: Tyler Milo D. Date of Service: 03/07/2016 12:45 PM Medical Record Number: [id number] Patient Account Number: [id number] Date of Birth/Sex: [birth year] (60 y.o.  Male) Treating [name]: Clover Mealy, [name], Frank, [place] Sink Primary Care Physician: Joen Laura Other Clinician: Referring Physician: Joen Laura Treating Physician/Extender: [name] Weeks in Treatment: 10 Vital Signs Height(in): 67 Pulse(bpm): 66 Weight(lbs): 195 Blood Pressure 126/92 (mmHg): Body Mass Index(BMI): 31 Temperature(F): 97.9 Respiratory Rate 18 (breaths/min): Photos: [2:No Photos] [N/A:N/A] Wound Location: [2:Left Foot - Plantar] [N/A:N/A] Wounding Event: [2:Gradually Appeared] [N/A:N/A] Primary Etiology: [2:Diabetic Wound/Ulcer of the Lower Extremity] [N/A:N/A] Comorbid History: [2:Anemia, Chronic Obstructive Pulmonary Disease (COPD), Arrhythmia, Congestive Heart Failure, Coronary Artery Disease, Hypertension, Type II Diabetes, Neuropathy] [N/A:N/A] Date Acquired: [2:12/21/2014] [  N/A:N/A] Weeks of Treatment: [2:10] [N/A:N/A] Wound Status: [2:Open] [N/A:N/A] Measurements L x W x D 0.6x0.8x0.1 [N/A:N/A] (cm) Area (cm) : [2:0.377] [N/A:N/A] Volume (cm) : [2:0.038] [N/A:N/A] % Reduction in Area: [2:52.00%] [N/A:N/A] % Reduction in Volume: 83.90% [N/A:N/A] Classification: [2:Grade 1] [N/A:N/A] Exudate Amount: [2:Medium] [N/A:N/A] Exudate Type: [2:Serosanguineous] [N/A:N/A] Exudate Color: [2:red, brown] [N/A:N/A] Wound Margin: [2:Distinct, outline attached] [N/A:N/A] Granulation Amount: [2:Medium (34-66%)] [N/A:N/A] Granulation Quality: [2:Pink] [N/A:N/A] Necrotic Amount: [2:None Present (0%)] [N/A:N/A] Exposed Structures: Fascia: No N/A N/A Fat: No Tendon: No Muscle: No Joint: No Bone: No Limited to Skin Breakdown Epithelialization: Medium (34-66%) N/A N/A Periwound Skin Texture: Callus: Yes N/A N/A Edema: No Excoriation: No Induration: No Crepitus: No Fluctuance: No Friable: No Rash: No Scarring: No Periwound Skin Maceration: Yes N/A N/A Moisture: Moist: Yes Dry/Scaly: No Periwound Skin Color: Atrophie Blanche: No N/A N/A Cyanosis:  No Ecchymosis: No Erythema: No Hemosiderin Staining: No Mottled: No Pallor: No Rubor: No Temperature: No Abnormality N/A N/A Tenderness on No N/A N/A Palpation: Wound Preparation: Ulcer Cleansing: N/A N/A Rinsed/Irrigated with Saline Topical Anesthetic Applied: Other: lidocaine 4% Treatment Notes Electronic Signature(s) Signed: 03/07/2016 4:09:11 PM By: Elpidio Eric BSN, [name] Entered By: Elpidio Eric on 03/07/2016 13:11:05 [name], [name] ([id number]) -------------------------------------------------------------------------------- Multi-Disciplinary Care Plan Details Patient Name: Tyler Milo D. Date of Service: 03/07/2016 12:45 PM Medical Record Number: [id number] Patient Account Number: [id number] Date of Birth/Sex: 26-Dec-1955 (60 y.o. Male) Treating [name]: Clover Mealy, [name], Frank, [place] Sink Primary Care Physician: Joen Laura Other Clinician: Referring Physician: Joen Laura Treating Physician/Extender: Altamese [place] in Treatment: 10 Active Inactive Abuse / Safety / Falls / Self Care Management Nursing Diagnoses: Impaired home maintenance Impaired physical mobility Knowledge deficit related to: safety; personal, health (wound), emergency Potential for falls Self care deficit: actual or potential Goals: Patient will remain injury free Date Initiated: 12/28/2015 Goal Status: Active Patient/caregiver will verbalize understanding of skin care regimen Date Initiated: 12/28/2015 Goal Status: Active Patient/caregiver will verbalize/demonstrate measure taken to improve self care Date Initiated: 12/28/2015 Goal Status: Active Patient/caregiver will verbalize/demonstrate measures taken to improve the patient's personal safety Date Initiated: 12/28/2015 Goal Status: Active Patient/caregiver will verbalize/demonstrate measures taken to prevent injury and/or falls Date Initiated: 12/28/2015 Goal Status: Active Patient/caregiver will verbalize/demonstrate understanding of what to do in  case of emergency Date Initiated: 12/28/2015 Goal Status: Active Interventions: Assess fall risk on admission and as needed Assess: immobility, friction, shearing, incontinence upon admission and as needed Assess impairment of mobility on admission and as needed per policy Assess self care needs on admission and as needed Provide education on basic hygiene [name], [name] ([id number]) Provide education on fall prevention Provide education on personal and home safety Provide education on safe transfers Treatment Activities: Education provided on Basic Hygiene : 02/29/2016 Notes: Orientation to the Wound Care Program Nursing Diagnoses: Knowledge deficit related to the wound healing center program Goals: Patient/caregiver will verbalize understanding of the Wound Healing Center Program Date Initiated: 12/28/2015 Goal Status: Active Interventions: Provide education on orientation to the wound center Notes: Venous Leg Ulcer Nursing Diagnoses: Knowledge deficit related to disease process and management Potential for venous Insuffiency (use before diagnosis confirmed) Goals: Non-invasive venous studies are completed as ordered Date Initiated: 12/28/2015 Goal Status: Active Patient/caregiver will verbalize understanding of disease process and disease management Date Initiated: 12/28/2015 Goal Status: Active Verify adequate tissue perfusion prior to therapeutic compression application Date Initiated: 12/28/2015 Goal Status: Active Interventions: Assess peripheral edema status every visit. Provide education on venous insufficiency Notes: [name], [name] (  [id number]) Wound/Skin Impairment Nursing Diagnoses: Impaired tissue integrity Knowledge deficit related to smoking impact on wound healing Knowledge deficit related to ulceration/compromised skin integrity Goals: Patient/caregiver will verbalize understanding of skin care regimen Date Initiated: 12/28/2015 Goal Status:  Active Ulcer/skin breakdown will have a volume reduction of 30% by week 4 Date Initiated: 12/28/2015 Goal Status: Active Ulcer/skin breakdown will have a volume reduction of 50% by week 8 Date Initiated: 12/28/2015 Goal Status: Active Ulcer/skin breakdown will have a volume reduction of 80% by week 12 Date Initiated: 12/28/2015 Goal Status: Active Ulcer/skin breakdown will heal within 14 weeks Date Initiated: 12/28/2015 Goal Status: Active Interventions: Assess patient/caregiver ability to obtain necessary supplies Assess patient/caregiver ability to perform ulcer/skin care regimen upon admission and as needed Assess ulceration(s) every visit Provide education on ulcer and skin care Notes: Electronic Signature(s) Signed: 03/07/2016 4:09:11 PM By: Elpidio Eric BSN, [name] Entered By: Elpidio Eric on 03/07/2016 13:10:53 [name], [name] ([id number]) -------------------------------------------------------------------------------- Patient/Caregiver Education Details Patient Name: Tyler Milo D. Date of Service: 03/07/2016 12:45 PM Medical Record Number: [id number] Patient Account Number: [id number] Date of Birth/Gender: [birth year] (60 y.o. Male) Treating [name]: Clover Mealy, [name], Frank, [place] Sink Primary Care Physician: Joen Laura Other Clinician: Referring Physician: Joen Laura Treating Physician/Extender: Altamese [place] in Treatment: 10 Education Assessment Education Provided To: Patient Education Topics Provided Basic Hygiene: Methods: Explain/Verbal Responses: State content correctly Safety: Methods: Explain/Verbal Responses: State content correctly Venous: Methods: Explain/Verbal Responses: State content correctly Welcome To The Wound Care Center: Methods: Explain/Verbal Wound/Skin Impairment: Methods: Explain/Verbal Responses: State content correctly Electronic Signature(s) Signed: 03/07/2016 1:25:51 PM By: Elpidio Eric BSN, [name] Entered By: Elpidio Eric on 03/07/2016 13:25:51 [name],  [name] ([id number]) -------------------------------------------------------------------------------- Wound Assessment Details Patient Name: [name], [name] Date of Service: 03/07/2016 12:45 PM Medical Record Number: [id number] Patient Account Number: [id number] Date of Birth/Sex: [birth year] (60 y.o. Male) Treating [name]: Clover Mealy, [name], Frank, [place] Sink Primary Care Physician: Joen Laura Other Clinician: Referring Physician: Joen Laura Treating Physician/Extender: [name] Weeks in Treatment: 10 Wound Status Wound Number: 2 Primary Diabetic Wound/Ulcer of the Lower Etiology: Extremity Wound Location: Left Foot - Plantar Wound Open Wounding Event: Gradually Appeared Status: Date Acquired: 12/21/2014 Comorbid Anemia, Chronic Obstructive Pulmonary Weeks Of Treatment: 10 History: Disease (COPD), Arrhythmia, Clustered Wound: No Congestive Heart Failure, Coronary Artery Disease, Hypertension, Type II Diabetes, Neuropathy Photos Photo Uploaded By: Elpidio Eric on 03/07/2016 15:25:52 Wound Measurements Length: (cm) 0.6 Width: (cm) 0.8 Depth: (cm) 0.1 Area: (cm) 0.377 Volume: (cm) 0.038 % Reduction in Area: 52% % Reduction in Volume: 83.9% Epithelialization: Medium (34-66%) Tunneling: No Undermining: No Wound Description Classification: Grade 1 Wound Margin: Distinct, outline attached Exudate Amount: Medium Exudate Type: Serosanguineous Exudate Color: red, brown Foul Odor After Cleansing: No Wound Bed Granulation Amount: Medium (34-66%) Exposed Structure Granulation Quality: Pink Fascia Exposed: No [name], [name] ([id number]) Necrotic Amount: None Present (0%) Fat Layer Exposed: No Tendon Exposed: No Muscle Exposed: No Joint Exposed: No Bone Exposed: No Limited to Skin Breakdown Periwound Skin Texture Texture Color No Abnormalities Noted: No No Abnormalities Noted: No Callus: Yes Atrophie Blanche: No Crepitus: No Cyanosis: No Excoriation: No Ecchymosis:  No Fluctuance: No Erythema: No Friable: No Hemosiderin Staining: No Induration: No Mottled: No Localized Edema: No Pallor: No Rash: No Rubor: No Scarring: No Temperature / Pain Moisture Temperature: No Abnormality No Abnormalities Noted: No Dry / Scaly: No Maceration: Yes Moist: Yes Wound Preparation Ulcer Cleansing: Rinsed/Irrigated with Saline Topical Anesthetic Applied: Other: lidocaine 4%, Treatment Notes Wound #2 (Left, Plantar  Foot) 1. Cleansed with: Clean wound with Normal Saline 4. Dressing Applied: Aquacel Ag 5. Secondary Dressing Applied Bordered Foam Dressing 6. Footwear/Offloading device applied Other footwear/offloading device applied (specify in notes) Notes [place] Electronic Signature(s) Signed: 03/07/2016 4:09:11 PM By: Elpidio EricAfful, Tyler BSN, [name] Entered By: Elpidio EricAfful, Tyler on 03/07/2016 13:02:21 [name], Sundiata D. ([id number]) Yonke, Calloway D. ([id number]) -------------------------------------------------------------------------------- Vitals Details Patient Name: Peruski, Gerrett D. Date of Service: 03/07/2016 12:45 PM Medical Record Number: [id number] Patient Account Number: [id number] Date of Birth/Sex: July 22, 1956 [age] y.o. Male) Treating [name]: Afful, [name], Frank, [place] Primary Care Physician: Joen LauraBLISS, LAURA Other Clinician: Referring Physician: Joen LauraBLISS, LAURA Treating Physician/Extender: Altamese CarolinaOBSON, Leven G Weeks in Treatment: 10 Vital Signs Time Taken: 13:03 Temperature (F): 97.9 Height (in): 67 Pulse (bpm): 66 Weight (lbs): 195 Respiratory Rate (breaths/min): 18 Body Mass Index (BMI): 30.5 Blood Pressure (mmHg): 126/92 Reference Range: 80 - 120 mg / dl Electronic Signature(s) Signed: 03/07/2016 4:09:11 PM By: Elpidio EricAfful, Tyler BSN, [name] Entered By: Elpidio EricAfful, Tyler on 03/07/2016 13:03:35

## 2016-03-09 NOTE — Progress Notes (Signed)
YUSUKE, BEZA ([id number]) Visit Report for 03/07/2016 Chief Complaint Document Details Patient Name: [name], [name]. Date of Service: 03/07/2016 12:45 PM Medical Record Patient Account Number: [id number] Number: Treating [name]: Clover Mealy, [name], [name], [name] [birth year] [phone] y.o. Other Clinician: Date of Birth/Sex: Male) Treating ROBSON, Ewart Primary Care Physician: Joen Laura Physician/Extender: G Referring Physician: Tedra Senegal in Treatment: 10 Information Obtained from: Patient Chief Complaint Patient is here for review of a wound on his left first metatarsal head that is been there for the last 6 months Electronic Signature(s) Signed: 03/09/2016 7:59:30 AM By: Baltazar Najjar MD Entered By: Baltazar Najjar on 03/07/2016 13:14:29 Luis, Itzae D. ([id number]) -------------------------------------------------------------------------------- Debridement Details Patient Name: Tyler Milo D. Date of Service: 03/07/2016 12:45 PM Medical Record Patient Account Number: [id number] Number: Treating [name]: Clover Mealy, [name], [name], [name] [id number] y.o. Other Clinician: Date of Birth/Sex: Male) Treating ROBSON, Mate Primary Care Physician: Joen Laura Physician/Extender: G Referring Physician: Tedra Senegal in Treatment: 10 Debridement Performed for Wound #2 Left,Plantar Foot Assessment: Performed By: Physician [name], MD Debridement: Debridement Pre-procedure Yes Verification/Time Out Taken: Start Time: 13:06 Pain Control: Lidocaine 4% Topical Solution Level: Skin/Subcutaneous Tissue Total Area Debrided (L x 0.6 (cm) x 0.8 (cm) = 0.48 (cm) W): Tissue and other Non-Viable, Fibrin/Slough, Subcutaneous material debrided: Instrument: Curette Bleeding: Minimum Hemostasis Achieved: Pressure End Time: 13:08 Procedural Pain: 0 Post Procedural Pain: 0 Response to Treatment: Procedure was tolerated well Post Debridement Measurements of Total  Wound Length: (cm) 0.6 Width: (cm) 0.8 Depth: (cm) 0.1 Volume: (cm) 0.038 Post Procedure Diagnosis Same as Pre-procedure Electronic Signature(s) Signed: 03/07/2016 4:09:11 PM By: Elpidio Eric BSN, [name] Signed: 03/09/2016 7:59:30 AM By: Baltazar Najjar MD Entered By: Baltazar Najjar on 03/07/2016 13:14:22 Caughlin, [name] ([id number]) -------------------------------------------------------------------------------- HPI Details Patient Name: Tyler Milo D. Date of Service: 03/07/2016 12:45 PM Medical Record Patient Account Number: [id number] Number: Treating [name]: Clover Mealy, [name], [name], [name] May 11, 1956 [phone] y.o. Other Clinician: Date of Birth/Sex: Male) Treating ROBSON, Eryx Primary Care Physician: Joen Laura Physician/Extender: G Referring Physician: Tedra Senegal in Treatment: 10 History of Present Illness HPI Description: 12/28/15. This patient is a type II diabetic on insulin. He had vascular interventions 2 weeks ago on 1/17. This included an angioplasty of the left superficial femoral and popliteal arteries as well as a percutaneous transluminal angioplasty of the left posterior tibial artery. Nevertheless he is here for a plantar wound that the patient says is been there for the last 6 months. He initially told us that he is applying Neosporin to this however he tells me also that he has been in a nursing home for the last year. I found that to be a somewhat on usual combination of fax. In any case the patient is a diabetic with diabetic PAD. 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 His ABI on the left side today was 0.62 01/11/16; we have been using Prisma to the wound areas on these first metatarsal head on the left and the small wound on his left anterior ankle. He states that the area on his left metatarsal head was not changed all week, this might account for why there is so much maceration here. In the meantime he tells me he  has fallen and hurt his shoulder x-ray is negative. He is also complaining of shortness of breath 01/18/16; the area on his left anterior lower leg/ankle is resolved. The area over his first metatarsal head  looks better. There is less maceration tissue here. Surgical debridement to remove surface slough and nonviable subcutaneous tissue however this generally appears better. 01/25/16; patient arrives today with several areas which are small and superficial on the left leg. This may be from scratching which he admittedly does. The area over the left first plantar metatarsal head looks improved. There is no maceration surrounding skin looks normal. There is some epithelialization attempting to get over this wound. 02/01/16 the areas on the left leg have closed down. The area over the first plantar metatarsal head on the left looks stable to improved although there is some maceration around this. Switch to silver alginate/Aquacel Ag 02/08/16; the areas on his legs remain closed down. The area over the first plantar metatarsal head on the left has advancing epithelialization with only a small open area remaining. Patient always look short of breath when he comes to our clinic but he states his wheezing is improved. We switched to silver alginate last week due to maceration around the wound 4/4the area on his leg remains closed. The area over the left first plantar metatarsal head is fully epithelialized. Patient tells me he has new shoes coming for both areas prosthetic leg on the right than the draining fluid on the left, this wound is certainly not ready for that type of force as of yet 02/22/16; the left first plantar metatarsal head has again split wide open which is exceptionally disappointing. Macerated surrounding skin debridement with subcutaneous tissue. Patient states he was exercising on an exercise bike, this friction over this area may of reopened this. 02/29/16 left first metatarsal head  wound is improved. Requires debridement, I removed some macerated surrounding skin nonviable surface slough. It appears better for/25/17 left first metatarsal head. Using Aquacel Ag. There is advancing epithelialization but fragile Kolbe, [name] ([id number]) Electronic Signature(s) Signed: 03/09/2016 7:59:30 AM By: Baltazar Najjarobson, Maxson MD Entered By: Baltazar Najjarobson, Youcef on 03/07/2016 13:14:59 Armon, [name] ([id number]) -------------------------------------------------------------------------------- Physical Exam Details Patient Name: [name], [name] Date of Service: 03/07/2016 12:45 PM Medical Record Patient Account Number: [id number] Number: Treating [name]: Clover MealyAfful, [name], [name], [name] [birth year] [phone](59 y.o. Other Clinician: Date of Birth/Sex: Male) Treating ROBSON, Chukwuemeka Primary Care Physician: Joen LauraBLISS, LAURA Physician/Extender: G Referring Physician: Tedra SenegalBLISS, LAURA Weeks in Treatment: 10 Notes Wound exam; the area over the left first metatarsal head is debridement. Roughly 40% of the surface is covered with advancing epithelialization but it appears fragile. There is no evidence of surrounding infection or drainage Electronic Signature(s) Signed: 03/09/2016 7:59:30 AM By: Baltazar Najjarobson, Hady MD Entered By: Baltazar Najjarobson, Tyquan on 03/07/2016 13:15:44 [name], [name] ([id number]) -------------------------------------------------------------------------------- Physician Orders Details Patient Name: Tyler MiloHAM, Tywon D. Date of Service: 03/07/2016 12:45 PM Medical Record Patient Account Number: [id number] Number: Treating [name]: Clover MealyAfful, [name], [name], [name] [birth year] [id number](59 y.o. Other Clinician: Date of Birth/Sex: Male) Treating ROBSON, Deverick Primary Care Physician: Joen LauraBLISS, LAURA Physician/Extender: G Referring Physician: Tedra SenegalBLISS, LAURA Weeks in Treatment: 10 Verbal / Phone Orders: Yes Clinician: Afful, [name], [name], [name] Read Back and Verified: Yes Diagnosis Coding Wound Cleansing Wound #2 Left,Plantar  Foot o Clean wound with Normal Saline. Anesthetic Wound #2 Left,Plantar Foot o Topical Lidocaine 4% cream applied to wound bed prior to debridement Skin Barriers/Peri-Wound Care Wound #2 Left,Plantar Foot o Skin Prep Primary Wound Dressing Wound #2 Left,Plantar Foot o Aquacel Ag Secondary Dressing Wound #2 Left,Plantar Foot o Boardered Foam Dressing Dressing Change Frequency Wound #2 Left,Plantar Foot o Change dressing every day. Follow-up Appointments Wound #2 Left,Plantar Foot o  Return Appointment in 1 week. Off-Loading Wound #2 Left,Plantar Foot o Open toe surgical shoe with peg assist. [name], [name] ([id number]) Additional Orders / Instructions Wound #2 Left,Plantar Foot o Increase protein intake. o Activity as tolerated Electronic Signature(s) Signed: 03/07/2016 4:09:11 PM By: Elpidio Eric BSN, [name] Signed: 03/09/2016 7:59:30 AM By: Baltazar Najjar MD Entered By: Elpidio Eric on 03/07/2016 13:12:49 [name], [name] Kitchen ([id number]) -------------------------------------------------------------------------------- Problem List Details Patient Name: [name], [name] Date of Service: 03/07/2016 12:45 PM Medical Record Patient Account Number: [id number] Number: Treating [name]: Clover Mealy, [name], [name], [name] Apr 18, 1956 [phone] y.o. 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 ([id number]) -------------------------------------------------------------------------------- Progress Note Details Patient Name: Tyler Milo D. Date of Service: 03/07/2016 12:45 PM Medical Record Patient Account Number: [id number] Number: Treating [name]: Clover Mealy, [name], [name], [name] 1956/04/12 [phone] y.o. Other Clinician: Date of Birth/Sex: Male) Treating ROBSON, Treg Primary Care Physician: Joen Laura Physician/Extender: G Referring Physician: Tedra [place] in Treatment: 10 Subjective Chief Complaint Information obtained from Patient Patient is here for review of a wound on his left first metatarsal head that is been there for the last 6 months History of Present Illness (HPI) 12/28/15. This patient is a type II diabetic on insulin. He had vascular interventions 2 weeks ago on 1/17. This included an angioplasty of the left superficial femoral and popliteal arteries as well as a percutaneous transluminal angioplasty of the left posterior tibial artery. Nevertheless he is here for a plantar wound that the patient says is been there for the last 6 months. He initially told us that he is applying Neosporin to this however he tells me also that he has been in a nursing home for the last year. I found that to be a somewhat on usual combination of fax. In any case the patient is a diabetic with diabetic PAD. 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 His ABI on the left side today was 0.62 01/11/16; we have been using Prisma to the wound areas on these first metatarsal head on the left and the small wound on his left anterior ankle. He states that the area on his left metatarsal head was not changed all week, this might account for why there is so much maceration here. In the meantime he tells me he has fallen and hurt his shoulder x-ray is negative. He is also complaining of shortness of  breath 01/18/16; the area on his left anterior lower leg/ankle is resolved. The area over his first metatarsal head looks better. There is less maceration tissue here. Surgical debridement to remove surface slough and nonviable subcutaneous tissue however this generally appears better. 01/25/16; patient arrives today with several areas which are small and superficial on the left leg. This may be from scratching which he admittedly does. The area over the left first plantar metatarsal head looks improved. There is no maceration surrounding skin looks normal. There is some epithelialization attempting to get over this  wound. 02/01/16 the areas on the left leg have closed down. The area over the first plantar metatarsal head on the left looks stable to improved although there is some maceration around this. Switch to silver alginate/Aquacel Ag 02/08/16; the areas on his legs remain closed down. The area over the first plantar metatarsal head on the left has advancing epithelialization with only a small open area remaining. Patient always look short of breath when he comes to our clinic but he states his wheezing is improved. We switched to silver alginate last week due to maceration around the wound 4/4the area on his leg remains closed. The area over the left first plantar metatarsal head is fully epithelialized. Patient tells me he has new shoes coming for both areas prosthetic leg on the right than the draining fluid on the left, this wound is certainly not ready for that type of force as of yet 02/22/16; the left first plantar metatarsal head has again split wide open which is exceptionally disappointing. Macerated surrounding skin debridement with subcutaneous tissue. Patient states he was exercising on an exercise bike, this friction over this area may of reopened this. THURLOW, GALLAGA ([id number]) 02/29/16 left first metatarsal head wound is improved. Requires debridement, I removed some  macerated surrounding skin nonviable surface slough. It appears better for/25/17 left first metatarsal head. Using Aquacel Ag. There is advancing epithelialization but fragile Objective Constitutional Vitals Time Taken: 1:03 PM, Height: 67 in, Weight: 195 lbs, BMI: 30.5, Temperature: 97.9 F, Pulse: 66 bpm, Respiratory Rate: 18 breaths/min, Blood Pressure: 126/92 mmHg. Integumentary (Hair, Skin) Wound #2 status is Open. Original cause of wound was Gradually Appeared. The wound is located on the Left,Plantar Foot. The wound measures 0.6cm length x 0.8cm width x 0.1cm depth; 0.377cm^2 area and 0.038cm^3 volume. The wound is limited to skin breakdown. There is no tunneling or undermining noted. There is a medium amount of serosanguineous drainage noted. The wound margin is distinct with the outline attached to the wound base. There is medium (34-66%) pink granulation within the wound bed. There is no necrotic tissue within the wound bed. The periwound skin appearance exhibited: Callus, Maceration, Moist. The periwound skin appearance did not exhibit: Crepitus, Excoriation, Fluctuance, Friable, Induration, Localized Edema, Rash, Scarring, Dry/Scaly, Atrophie Blanche, Cyanosis, Ecchymosis, Hemosiderin Staining, Mottled, Pallor, Rubor, Erythema. Periwound temperature was noted as No Abnormality. Assessment Active Problems ICD-10 L97.522 - Non-pressure chronic ulcer of other part of left foot with fat layer exposed E11.621 - Type 2 diabetes mellitus with foot ulcer E11.51 - Type 2 diabetes mellitus with diabetic peripheral angiopathy without gangrene Procedures Wound #2 Wound #2 is a Diabetic Wound/Ulcer of the Lower Extremity located on the Left,Plantar Foot . There was a Struckman, Garyson D. ([id number]) Skin/Subcutaneous Tissue Debridement ([id number]) debridement with total area of 0.48 sq cm performed by [name], MD. with the following instrument(s): Curette to remove  Non-Viable tissue/material including Fibrin/Slough and Subcutaneous after achieving pain control using Lidocaine 4% Topical Solution. A time out was conducted prior to the start of the procedure. A Minimum amount of bleeding was controlled with Pressure. The procedure was tolerated well with a pain level of 0 throughout and a pain level of 0 following the procedure. Post Debridement Measurements: 0.6cm length x 0.8cm width x 0.1cm depth; 0.038cm^3 volume. Post procedure Diagnosis Wound #2: Same as Pre-Procedure Plan Wound Cleansing: Wound #2 Left,Plantar Foot: Clean wound with Normal Saline. Anesthetic: Wound #2 Left,Plantar Foot: Topical Lidocaine 4% cream applied to wound bed prior to  debridement Skin Barriers/Peri-Wound Care: Wound #2 Left,Plantar Foot: Skin Prep Primary Wound Dressing: Wound #2 Left,Plantar Foot: Aquacel Ag Secondary Dressing: Wound #2 Left,Plantar Foot: Boardered Foam Dressing Dressing Change Frequency: Wound #2 Left,Plantar Foot: Change dressing every day. Follow-up Appointments: Wound #2 Left,Plantar Foot: Return Appointment in 1 week. Off-Loading: Wound #2 Left,Plantar Foot: Open toe surgical shoe with peg assist. Additional Orders / Instructions: Wound #2 Left,Plantar Foot: Increase protein intake. Activity as tolerated Sturgeon, Gotham D. ([id number]) #1 continue Aquacel Ag with a foam cover. #2 he states he is not putting much pressure on his left foot and currently using a healing sandal Electronic Signature(s) Signed: 03/09/2016 7:59:30 AM By: Baltazar Najjar MD Entered By: Baltazar Najjar on 03/07/2016 13:16:30 Henry, Fortino D. ([id number]) -------------------------------------------------------------------------------- SuperBill Details Patient Name: Tyler Milo D. Date of Service: 03/07/2016 Medical Record Patient Account Number: [id number] Number: Treating [name]: Clover Mealy, [name], [name], [name] 11/04/1956 [phone] y.o. Other Clinician: Date of  Birth/Sex: Male) Treating ROBSON, Rayner Primary Care Physician: Joen Laura Physician/Extender: G Referring Physician: Tedra Sen[place] in Treatment: 10 Diagnosis Coding ICD-10 Codes Code Description [phone] Non-pressure chronic ulcer of other part of left foot with fat layer exposed E11.621 Type 2 diabetes mellitus with foot ulcer E11.51 Type 2 diabetes mellitus with diabetic peripheral angiopathy without gangrene Facility Procedures CPT4 Code Description: [id number] 11042 - DEB SUBQ TISSUE 20 SQ CM/< ICD-10 Description Diagnosis L97.522 Non-pressure chronic ulcer of other part of left foot Modifier: with fat lay Quantity: 1 er exposed Physician Procedures CPT4 Code Description: [id number] 11042 - WC PHYS SUBQ TISS 20 SQ CM ICD-10 Description Diagnosis L97.522 Non-pressure chronic ulcer of other part of left foot Modifier: with fat laye Quantity: 1 r exposed Electronic Signature(s) Signed: 03/09/2016 7:59:30 AM By: Baltazar Najjar MD Entered By: Baltazar Najjar on 03/07/2016 13:16:47

## 2016-03-14 ENCOUNTER — Encounter: Payer: Medicare Other | Attending: Internal Medicine | Admitting: Internal Medicine

## 2016-03-14 DIAGNOSIS — L97522 Non-pressure chronic ulcer of other part of left foot with fat layer exposed: Secondary | ICD-10-CM | POA: Insufficient documentation

## 2016-03-14 DIAGNOSIS — E1151 Type 2 diabetes mellitus with diabetic peripheral angiopathy without gangrene: Secondary | ICD-10-CM | POA: Insufficient documentation

## 2016-03-14 DIAGNOSIS — E11621 Type 2 diabetes mellitus with foot ulcer: Secondary | ICD-10-CM | POA: Insufficient documentation

## 2016-03-14 DIAGNOSIS — Z794 Long term (current) use of insulin: Secondary | ICD-10-CM | POA: Diagnosis not present

## 2016-03-15 NOTE — Progress Notes (Signed)
PRUITT, TABOADA ([id number]) Visit Report for 03/14/2016 Arrival Information Details Patient Name: [name], [name]. Date of Service: 03/14/2016 10:45 AM Medical Record Number: [id number] Patient Account Number: [id number] Date of Birth/Sex: 1956/03/13 (59 y.o. Male) Treating RN: Clover Mealy, RN, BSN, [place] Sink Primary Care Physician: Joen Laura Other Clinician: Referring Physician: Joen Laura Treating Physician/Extender: Altamese [place] in Treatment: 11 Visit Information History Since Last Visit Added or deleted any medications: No Patient Arrived: Wheel Chair Any new allergies or adverse reactions: No Arrival Time: 10:45 Had a fall or experienced change in No activities of daily living that may affect Accompanied By: self risk of falls: Transfer Assistance: None Signs or symptoms of abuse/neglect No Patient Identification Verified: Yes since last visito Secondary Verification Process Yes Hospitalized since last visit: No Completed: Has Dressing in Place as Prescribed: Yes Patient Requires Transmission-Based No Has Footwear/Offloading in Place as Yes Precautions: Prescribed: Patient Has Alerts: No Left: Surgical Shoe with Pressure Relief Insole Pain Present Now: No Electronic Signature(s) Signed: 03/14/2016 5:12:30 PM By: Elpidio Eric BSN, RN Entered By: Elpidio Eric on 03/14/2016 10:45:59 Frank, [name] ([id number]) -------------------------------------------------------------------------------- Encounter Discharge Information Details Patient Name: Tyler Milo D. Date of Service: 03/14/2016 10:45 AM Medical Record Number: [id number] Patient Account Number: [id number] Date of Birth/Sex: 1956/06/21 (60 y.o. Male) Treating RN: Clover Mealy, RN, BSN, [place] Sink Primary Care Physician: Joen Laura Other Clinician: Referring Physician: Joen Laura Treating Physician/Extender: Altamese [place] in Treatment: 11 Encounter Discharge Information Items Discharge Pain Level: 0 Discharge  Condition: Stable Ambulatory Status: Wheelchair Discharge Destination: Nursing Home Transportation: Private Auto Accompanied By: self Schedule Follow-up Appointment: No Medication Reconciliation completed No and provided to Patient/Care Porsha Skilton: Provided on Clinical Summary of Care: 03/14/2016 Form Type Recipient Paper Patient [facility] Electronic Signature(s) Signed: 03/14/2016 4:25:56 PM By: Elpidio Eric BSN, RN Previous Signature: 03/14/2016 11:19:06 AM Version By: Gwenlyn Perking Entered By: Elpidio Eric on 03/14/2016 16:25:56 Frank, [name] ([id number]) -------------------------------------------------------------------------------- Lower Extremity Assessment Details Patient Name: Frank, [name] Date of Service: 03/14/2016 10:45 AM Medical Record Number: [id number] Patient Account Number: [id number] Date of Birth/Sex: [birth year] (60 y.o. Male) Treating RN: Clover Mealy, RN, BSN, [place] Sink Primary Care Physician: Joen Laura Other Clinician: Referring Physician: Joen Laura Treating Physician/Extender: Altamese [place] in Treatment: 11 Edema Assessment Assessed: [Left: No] [Right: No] E[Left: dema] [Right: :] Calf Left: Right: Point of Measurement: 34 cm From Medial Instep cm cm Ankle Left: Right: Point of Measurement: 8 cm From Medial Instep cm cm Vascular Assessment Pulses: Posterior Tibial Dorsalis Pedis Palpable: [Left:Yes] Extremity colors, hair growth, and conditions: Extremity Color: [Left:Mottled] Hair Growth on Extremity: [Left:No] Temperature of Extremity: [Left:Warm] Capillary Refill: [Left:< 3 seconds] Toe Nail Assessment Left: Right: Thick: Yes Discolored: Yes Deformed: No Improper Length and Hygiene: No Electronic Signature(s) Signed: 03/14/2016 5:12:30 PM By: Elpidio Eric BSN, RN Entered By: Elpidio Eric on 03/14/2016 10:47:44 Frank, [name] ([id number]) -------------------------------------------------------------------------------- Multi Wound Chart  Details Patient Name: Tyler Milo D. Date of Service: 03/14/2016 10:45 AM Medical Record Number: [id number] Patient Account Number: [id number] Date of Birth/Sex: 10-28-1956 (60 y.o. Male) Treating RN: Clover Mealy, RN, BSN, [place] Sink Primary Care Physician: Joen Laura Other Clinician: Referring Physician: Joen Laura Treating Physician/Extender: Altamese [place] in Treatment: 11 Vital Signs Height(in): 67 Pulse(bpm): 62 Weight(lbs): 195 Blood Pressure 140/61 (mmHg): Body Mass Index(BMI): 31 Temperature(F): 98.3 Respiratory Rate 16 (breaths/min): Photos: [2:No Photos] [N/A:N/A] Wound Location: [2:Left Foot - Plantar] [N/A:N/A] Wounding Event: [2:Gradually Appeared] [N/A:N/A] Primary Etiology: [2:Diabetic Wound/Ulcer of the Lower Extremity] [N/A:N/A]  Comorbid History: [2:Anemia, Chronic Obstructive Pulmonary Disease (COPD), Arrhythmia, Congestive Heart Failure, Coronary Artery Disease, Hypertension, Type II Diabetes, Neuropathy] [N/A:N/A] Date Acquired: [2:12/21/2014] [N/A:N/A] Weeks of Treatment: [2:11] [N/A:N/A] Wound Status: [2:Open] [N/A:N/A] Measurements L x W x D 1x1x0.1 [N/A:N/A] (cm) Area (cm) : [2:0.785] [N/A:N/A] Volume (cm) : [2:0.079] [N/A:N/A] % Reduction in Area: [2:0.00%] [N/A:N/A] % Reduction in Volume: 66.50% [N/A:N/A] Classification: [2:Grade 1] [N/A:N/A] Exudate Amount: [2:Medium] [N/A:N/A] Exudate Type: [2:Serosanguineous] [N/A:N/A] Exudate Color: [2:red, brown] [N/A:N/A] Wound Margin: [2:Distinct, outline attached] [N/A:N/A] Granulation Amount: [2:Medium (34-66%)] [N/A:N/A] Granulation Quality: [2:Pink] [N/A:N/A] Necrotic Amount: [2:Small (1-33%)] [N/A:N/A] Exposed Structures: Fascia: No N/A N/A Fat: No Tendon: No Muscle: No Joint: No Bone: No Limited to Skin Breakdown Epithelialization: Medium (34-66%) N/A N/A Periwound Skin Texture: Callus: Yes N/A N/A Edema: No Excoriation: No Induration: No Crepitus: No Fluctuance: No Friable:  No Rash: No Scarring: No Periwound Skin Maceration: Yes N/A N/A Moisture: Moist: Yes Dry/Scaly: No Periwound Skin Color: Atrophie Blanche: No N/A N/A Cyanosis: No Ecchymosis: No Erythema: No Hemosiderin Staining: No Mottled: No Pallor: No Rubor: No Temperature: No Abnormality N/A N/A Tenderness on No N/A N/A Palpation: Wound Preparation: Ulcer Cleansing: N/A N/A Rinsed/Irrigated with Saline Topical Anesthetic Applied: Other: lidocaine 4% Treatment Notes Electronic Signature(s) Signed: 03/14/2016 5:12:30 PM By: Elpidio Eric BSN, RN Entered By: Elpidio Eric on 03/14/2016 10:52:45 Frank, Tyler Fells ([id number]) -------------------------------------------------------------------------------- Multi-Disciplinary Care Plan Details Patient Name: Tyler Milo D. Date of Service: 03/14/2016 10:45 AM Medical Record Number: [id number] Patient Account Number: [id number] Date of Birth/Sex: August 09, 1956 (60 y.o. Male) Treating RN: Clover Mealy, RN, BSN, [place] Sink Primary Care Physician: Joen Laura Other Clinician: Referring Physician: Joen Laura Treating Physician/Extender: Altamese [place] in Treatment: 11 Active Inactive Abuse / Safety / Falls / Self Care Management Nursing Diagnoses: Impaired home maintenance Impaired physical mobility Knowledge deficit related to: safety; personal, health (wound), emergency Potential for falls Self care deficit: actual or potential Goals: Patient will remain injury free Date Initiated: 12/28/2015 Goal Status: Active Patient/caregiver will verbalize understanding of skin care regimen Date Initiated: 12/28/2015 Goal Status: Active Patient/caregiver will verbalize/demonstrate measure taken to improve self care Date Initiated: 12/28/2015 Goal Status: Active Patient/caregiver will verbalize/demonstrate measures taken to improve the patient's personal safety Date Initiated: 12/28/2015 Goal Status: Active Patient/caregiver will verbalize/demonstrate  measures taken to prevent injury and/or falls Date Initiated: 12/28/2015 Goal Status: Active Patient/caregiver will verbalize/demonstrate understanding of what to do in case of emergency Date Initiated: 12/28/2015 Goal Status: Active Interventions: Assess fall risk on admission and as needed Assess: immobility, friction, shearing, incontinence upon admission and as needed Assess impairment of mobility on admission and as needed per policy Assess self care needs on admission and as needed Provide education on basic hygiene FREMONT, SKALICKY ([id number]) Provide education on fall prevention Provide education on personal and home safety Provide education on safe transfers Treatment Activities: Education provided on Basic Hygiene : 03/07/2016 Notes: Orientation to the Wound Care Program Nursing Diagnoses: Knowledge deficit related to the wound healing center program Goals: Patient/caregiver will verbalize understanding of the Wound Healing Center Program Date Initiated: 12/28/2015 Goal Status: Active Interventions: Provide education on orientation to the wound center Notes: Venous Leg Ulcer Nursing Diagnoses: Knowledge deficit related to disease process and management Potential for venous Insuffiency (use before diagnosis confirmed) Goals: Non-invasive venous studies are completed as ordered Date Initiated: 12/28/2015 Goal Status: Active Patient/caregiver will verbalize understanding of disease process and disease management Date Initiated: 12/28/2015 Goal Status: Active Verify adequate tissue perfusion prior to therapeutic compression  application Date Initiated: 12/28/2015 Goal Status: Active Interventions: Assess peripheral edema status every visit. Provide education on venous insufficiency Notes: Frank, [name] ([id number]) Wound/Skin Impairment Nursing Diagnoses: Impaired tissue integrity Knowledge deficit related to smoking impact on wound healing Knowledge deficit related  to ulceration/compromised skin integrity Goals: Patient/caregiver will verbalize understanding of skin care regimen Date Initiated: 12/28/2015 Goal Status: Active Ulcer/skin breakdown will have a volume reduction of 30% by week 4 Date Initiated: 12/28/2015 Goal Status: Active Ulcer/skin breakdown will have a volume reduction of 50% by week 8 Date Initiated: 12/28/2015 Goal Status: Active Ulcer/skin breakdown will have a volume reduction of 80% by week 12 Date Initiated: 12/28/2015 Goal Status: Active Ulcer/skin breakdown will heal within 14 weeks Date Initiated: 12/28/2015 Goal Status: Active Interventions: Assess patient/caregiver ability to obtain necessary supplies Assess patient/caregiver ability to perform ulcer/skin care regimen upon admission and as needed Assess ulceration(s) every visit Provide education on ulcer and skin care Notes: Electronic Signature(s) Signed: 03/14/2016 5:12:30 PM By: Elpidio Eric BSN, RN Entered By: Elpidio Eric on 03/14/2016 10:51:50 Frank, [name] ([id number]) -------------------------------------------------------------------------------- Pain Assessment Details Patient Name: Tyler Milo D. Date of Service: 03/14/2016 10:45 AM Medical Record Number: [id number] Patient Account Number: [id number] Date of Birth/Sex: [birth year] (60 y.o. Male) Treating RN: Clover Mealy, RN, BSN, [place] Sink Primary Care Physician: Joen Laura Other Clinician: Referring Physician: Joen Laura Treating Physician/Extender: Altamese [place] in Treatment: 11 Active Problems Location of Pain Severity and Description of Pain Patient Has Paino No Site Locations Pain Management and Medication Current Pain Management: Electronic Signature(s) Signed: 03/14/2016 5:12:30 PM By: Elpidio Eric BSN, RN Entered By: Elpidio Eric on 03/14/2016 10:47:13 Pica, Tyler Fells ([id number]) -------------------------------------------------------------------------------- Patient/Caregiver Education  Details Patient Name: Tyler Milo D. Date of Service: 03/14/2016 10:45 AM Medical Record Number: [id number] Patient Account Number: [id number] Date of Birth/Gender: 1956-03-12 (60 y.o. Male) Treating RN: Clover Mealy, RN, BSN, [place] Sink Primary Care Physician: Joen Laura Other Clinician: Referring Physician: Joen Laura Treating Physician/Extender: Altamese [place] in Treatment: 11 Education Assessment Education Provided To: Patient Education Topics Provided Basic Hygiene: Methods: Explain/Verbal Responses: State content correctly Safety: Methods: Explain/Verbal Responses: State content correctly Venous: Methods: Explain/Verbal Responses: State content correctly Welcome To The Wound Care Center: Methods: Explain/Verbal Responses: State content correctly Wound/Skin Impairment: Methods: Explain/Verbal Responses: State content correctly Electronic Signature(s) Signed: 03/14/2016 4:26:22 PM By: Elpidio Eric BSN, RN Entered By: Elpidio Eric on 03/14/2016 16:26:22 Frank, [name] ([id number]) -------------------------------------------------------------------------------- Wound Assessment Details Patient Name: Frank, [name] Date of Service: 03/14/2016 10:45 AM Medical Record Number: [id number] Patient Account Number: [id number] Date of Birth/Sex: [birth year] (60 y.o. Male) Treating RN: Afful, RN, BSN, [place] Sink Primary Care Physician: Joen Laura Other Clinician: Referring Physician: Joen Laura Treating Physician/Extender: Altamese  in Treatment: 11 Wound Status Wound Number: 2 Primary Diabetic Wound/Ulcer of the Lower Etiology: Extremity Wound Location: Left Foot - Plantar Wound Open Wounding Event: Gradually Appeared Status: Date Acquired: 12/21/2014 Comorbid Anemia, Chronic Obstructive Pulmonary Weeks Of Treatment: 11 History: Disease (COPD), Arrhythmia, Clustered Wound: No Congestive Heart Failure, Coronary Artery Disease, Hypertension, Type II Diabetes,  Neuropathy Photos Photo Uploaded By: Elpidio Eric on 03/14/2016 16:34:34 Wound Measurements Length: (cm) 1 Width: (cm) 1 Depth: (cm) 0.1 Area: (cm) 0.785 Volume: (cm) 0.079 % Reduction in Area: 0% % Reduction in Volume: 66.5% Epithelialization: Medium (34-66%) Tunneling: No Undermining: No Wound Description Classification: Grade 1 Wound Margin: Distinct, outline attached Exudate Amount: Medium Exudate Type: Serosanguineous Exudate Color: red, brown Foul Odor After Cleansing: No Wound Bed Granulation  Amount: Medium (34-66%) Exposed Structure Granulation Quality: Pink Fascia Exposed: No Figueira, Kekai D. ([id number]) Necrotic Amount: Small (1-33%) Fat Layer Exposed: No Necrotic Quality: Adherent Slough Tendon Exposed: No Muscle Exposed: No Joint Exposed: No Bone Exposed: No Limited to Skin Breakdown Periwound Skin Texture Texture Color No Abnormalities Noted: No No Abnormalities Noted: No Callus: Yes Atrophie Blanche: No Crepitus: No Cyanosis: No Excoriation: No Ecchymosis: No Fluctuance: No Erythema: No Friable: No Hemosiderin Staining: No Induration: No Mottled: No Localized Edema: No Pallor: No Rash: No Rubor: No Scarring: No Temperature / Pain Moisture Temperature: No Abnormality No Abnormalities Noted: No Dry / Scaly: No Maceration: Yes Moist: Yes Wound Preparation Ulcer Cleansing: Rinsed/Irrigated with Saline Topical Anesthetic Applied: Other: lidocaine 4%, Treatment Notes Wound #2 (Left, Plantar Foot) 1. Cleansed with: Cleanse wound with antibacterial soap and water 4. Dressing Applied: Iodoflex 5. Secondary Dressing Applied Gauze and Kerlix/Conform 7. Secured with Self adhesive bandage Notes [place] Electronic Signature(s) Signed: 03/14/2016 5:12:30 PM By: Elpidio EricAfful, Rita BSN, RN Entered By: Elpidio EricAfful, Rita on 03/14/2016 10:50:56 Roel, Bandy D. ([id number]) [place], [place] Kitchen.  ([id number]) -------------------------------------------------------------------------------- Vitals Details Patient Name: Boutelle, Shakim D. Date of Service: 03/14/2016 10:45 AM Medical Record Number: [id number] Patient Account Number: [id number] Date of Birth/Sex: 08-13-1956 5(59 y.o. Male) Treating RN: Afful, RN, BSN, [place] Primary Care Physician: Joen LauraBLISS, LAURA Other Clinician: Referring Physician: Joen LauraBLISS, LAURA Treating Physician/Extender: Altamese CarolinaOBSON, Duval G Weeks in Treatment: 11 Vital Signs Time Taken: 10:51 Temperature (F): 98.3 Height (in): 67 Pulse (bpm): 62 Weight (lbs): 195 Respiratory Rate (breaths/min): 16 Body Mass Index (BMI): 30.5 Blood Pressure (mmHg): 140/61 Reference Range: 80 - 120 mg / dl Electronic Signature(s) Signed: 03/14/2016 5:12:30 PM By: Elpidio EricAfful, Rita BSN, RN Entered By: Elpidio EricAfful, Rita on 03/14/2016 10:51:41

## 2016-03-15 NOTE — Progress Notes (Signed)
JLYNN, LANGILLE ([id number]) Visit Report for 03/14/2016 Chief Complaint Document Details Patient Name: [name], [name]. Date of Service: 03/14/2016 10:45 AM Medical Record Patient Account Number: [id number] Number: Treating RN: Clover Mealy, RN, BSN, Rita 1956-09-06 [phone] y.o. Other Clinician: Date of Birth/Sex: Male) Treating Jolie Strohecker, Rayjon Primary Care Physician: Joen Laura Physician/Extender: G Referring Physician: Tedra Senegal in Treatment: 11 Information Obtained from: Patient Chief Complaint Patient is here for review of a wound on his left first metatarsal head that is been there for the last 6 months Electronic Signature(s) Signed: 03/15/2016 8:00:31 AM By: Baltazar Najjar MD Entered By: Baltazar Najjar on 03/14/2016 13:44:23 Middlebrooks, Linkin D. ([id number]) -------------------------------------------------------------------------------- Debridement Details Patient Name: Tyler Milo D. Date of Service: 03/14/2016 10:45 AM Medical Record Patient Account Number: [id number] Number: Treating RN: Clover Mealy, RN, BSN, Rita 02-11-1956 [phone] y.o. Other Clinician: Date of Birth/Sex: Male) Treating Ledarius Leeson, Tome Primary Care Physician: Joen Laura Physician/Extender: G Referring Physician: Tedra Senegal in Treatment: 11 Debridement Performed for Wound #2 Left,Plantar Foot Assessment: Performed By: Physician [name], MD Debridement: Debridement Pre-procedure Yes Verification/Time Out Taken: Start Time: 10:50 Pain Control: Lidocaine 4% Topical Solution Level: Skin/Subcutaneous Tissue Total Area Debrided (L x 1 (cm) x 1 (cm) = 1 (cm) W): Tissue and other Non-Viable, Fibrin/Slough, Subcutaneous material debrided: Instrument: Curette Bleeding: Minimum Hemostasis Achieved: Pressure End Time: 10:55 Procedural Pain: 0 Post Procedural Pain: 0 Response to Treatment: Procedure was tolerated well Post Debridement Measurements of Total Wound Length: (cm)  1 Width: (cm) 1 Depth: (cm) 0.2 Volume: (cm) 0.157 Post Procedure Diagnosis Same as Pre-procedure Electronic Signature(s) Signed: 03/14/2016 5:12:30 PM By: Elpidio Eric BSN, RN Signed: 03/15/2016 8:00:31 AM By: Baltazar Najjar MD Entered By: Baltazar Najjar on 03/14/2016 13:44:11 Whack, Domanick D. ([id number]) -------------------------------------------------------------------------------- HPI Details Patient Name: Tyler Milo D. Date of Service: 03/14/2016 10:45 AM Medical Record Patient Account Number: [id number] Number: Treating RN: Clover Mealy, RN, BSN, Rita 08/03/1956 [phone] y.o. Other Clinician: Date of Birth/Sex: Male) Treating October Peery, Jeanluc Primary Care Physician: Joen Laura Physician/Extender: G Referring Physician: Tedra Senegal in Treatment: 11 History of Present Illness HPI Description: 12/28/15. This patient is a type II diabetic on insulin. He had vascular interventions 2 weeks ago on 1/17. This included an angioplasty of the left superficial femoral and popliteal arteries as well as a percutaneous transluminal angioplasty of the left posterior tibial artery. Nevertheless he is here for a plantar wound that the patient says is been there for the last 6 months. He initially told us that he is applying Neosporin to this however he tells me also that he has been in a nursing home for the last year. I found that to be a somewhat on usual combination of fax. In any case the patient is a diabetic with diabetic PAD. 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 His ABI on the left side today was 0.62 01/11/16; we have been using Prisma to the wound areas on these first metatarsal head on the left and the small wound on his left anterior ankle. He states that the area on his left metatarsal head was not changed all week, this might account for why there is so much maceration here. In the meantime he tells me he has fallen and hurt his  shoulder x-ray is negative. He is also complaining of shortness of breath 01/18/16; the area on his left anterior lower leg/ankle is resolved. The area over his first metatarsal head  looks better. There is less maceration tissue here. Surgical debridement to remove surface slough and nonviable subcutaneous tissue however this generally appears better. 01/25/16; patient arrives today with several areas which are small and superficial on the left leg. This may be from scratching which he admittedly does. The area over the left first plantar metatarsal head looks improved. There is no maceration surrounding skin looks normal. There is some epithelialization attempting to get over this wound. 02/01/16 the areas on the left leg have closed down. The area over the first plantar metatarsal head on the left looks stable to improved although there is some maceration around this. Switch to silver alginate/Aquacel Ag 02/08/16; the areas on his legs remain closed down. The area over the first plantar metatarsal head on the left has advancing epithelialization with only a small open area remaining. Patient always look short of breath when he comes to our clinic but he states his wheezing is improved. We switched to silver alginate last week due to maceration around the wound 4/4the area on his leg remains closed. The area over the left first plantar metatarsal head is fully epithelialized. Patient tells me he has new shoes coming for both areas prosthetic leg on the right than the draining fluid on the left, this wound is certainly not ready for that type of force as of yet 02/22/16; the left first plantar metatarsal head has again split wide open which is exceptionally disappointing. Macerated surrounding skin debridement with subcutaneous tissue. Patient states he was exercising on an exercise bike, this friction over this area may of reopened this. 02/29/16 left first metatarsal head wound is improved. Requires  debridement, I removed some macerated surrounding skin nonviable surface slough. It appears better 03/07/16 left first metatarsal head. Using Aquacel Ag. There is advancing epithelialization but fragile 03/14/16; left first metatarsal head presents with a non viable surface. Debridement is necessary . Subcutaneous tissue is not viable ARUN, HERROD ([id number]) Electronic Signature(s) Signed: 03/15/2016 8:00:31 AM By: Baltazar Najjar MD Entered By: Baltazar Najjar on 03/14/2016 13:45:57 Laughery, [name] ([id number]) -------------------------------------------------------------------------------- Physical Exam Details Patient Name: Brilliant, Kebin D. Date of Service: 03/14/2016 10:45 AM Medical Record Patient Account Number: [id number] Number: Treating RN: Clover Mealy, RN, BSN, Rita 1956-03-04 [phone] y.o. Other Clinician: Date of Birth/Sex: Male) Treating Mardy Hoppe, Taeden Primary Care Physician: Joen Laura Physician/Extender: G Referring Physician: Tedra Senegal in Treatment: 11 Notes Wound exam; the area over the left metatarsal head had a surface, last week I wasn't really sure whether this was viable or not and elected to leave it in place however this week it is clearly not a viable heeling surface and debridement this to some depth to get to healthier granulation tissue again. There is no evidence of infection, the patient is compliant with pressure-relief. Electronic Signature(s) Signed: 03/15/2016 8:00:31 AM By: Baltazar Najjar MD Entered By: Baltazar Najjar on 03/14/2016 13:48:50 Vanorder, [name] ([id number]) -------------------------------------------------------------------------------- Physician Orders Details Patient Name: Tyler Milo D. Date of Service: 03/14/2016 10:45 AM Medical Record Patient Account Number: [id number] Number: Treating RN: Clover Mealy, RN, BSN, Rita Oct 16, 1956 [phone] y.o. Other Clinician: Date of Birth/Sex: Male) Treating Breanna Mcdaniel, Adarsh Primary Care  Physician: Joen Laura Physician/Extender: G Referring Physician: Tedra Senegal in Treatment: 47 Verbal / Phone Orders: Yes Clinician: Afful, RN, BSN, Rita Read Back and Verified: Yes Diagnosis Coding Wound Cleansing Wound #2 Left,Plantar Foot o Clean wound with Normal Saline. Anesthetic Wound #2 Left,Plantar Foot o Topical Lidocaine 4% cream applied to wound bed  prior to debridement Primary Wound Dressing Wound #2 Left,Plantar Foot o Iodoflex Secondary Dressing Wound #2 Left,Plantar Foot o Gauze and Kerlix/Conform Dressing Change Frequency Wound #2 Left,Plantar Foot o [facility] SK[facility] facility should not change dressing!!. Follow-up Appointments Wound #2 Left,Plantar Foot o Return Appointment in 1 week. Off-Loading Wound #2 Left,Plantar Foot o Open toe surgical shoe with peg assist. o Other: - Felt Additional Orders / Instructions Wound #2 Left,Plantar Foot o Increase protein intake. o Activity as tolerated Seckinger, Veverly FellsMICHAEL D. ([id number]) Electronic Signature(s) Signed: 03/14/2016 5:12:30 PM By: Elpidio EricAfful, Rita BSN, RN Signed: 03/15/2016 8:00:31 AM By: Baltazar Najjarobson, Wilman MD Entered By: Elpidio EricAfful, Rita on 03/14/2016 11:18:33 Hockett, Gaither Marland Kitchen. ([id number]) -------------------------------------------------------------------------------- Problem List Details Patient Name: Deese, Navin D. Date of Service: 03/14/2016 10:45 AM Medical Record Patient Account Number: [id number] Number: Treating RN: Clover MealyAfful, RN, BSN, Rita [birth year] [phone](59 y.o. 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 ([id number]) -------------------------------------------------------------------------------- Progress Note Details Patient Name: Tyler MiloHAM, Phu D. Date of Service: 03/14/2016 10:45 AM Medical Record Patient Account Number: [id number] Number: Treating RN: Clover MealyAfful, RN, BSN, Rita [birth year] [phone](59 y.o. Other Clinician: Date of Birth/Sex: Male) Treating Arohi Salvatierra, Makyle Primary Care Physician: Joen LauraBLISS, LAURA Physician/Extender: G Referring Physician: Tedra SenegalBLISS, LAURA Weeks in Treatment: 11 Subjective Chief Complaint Information obtained from Patient Patient is here for review of a wound on his left first metatarsal head that is been there for the last 6 months History of Present Illness (HPI) 12/28/15. This patient is a type II diabetic on insulin. He had vascular interventions 2 weeks ago on 1/17. This included an angioplasty of the left superficial femoral and popliteal arteries as well as a percutaneous transluminal angioplasty of the left posterior tibial artery. Nevertheless he is here for a plantar wound that the patient says is been there for the last 6 months. He initially told us that he is applying Neosporin to this however he tells me also that he has been in a nursing home for the last year. I found that to be a somewhat on usual combination of fax. In any case the patient is a diabetic with diabetic PAD. 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 His ABI on the left side today was 0.62 01/11/16; we have been using Prisma to the wound areas on these first metatarsal head on the left and the small wound on his left anterior ankle. He states that the  area on his left metatarsal head was not changed all week, this might account for why there is so much maceration here. In the meantime he tells me he has fallen and hurt his shoulder x-ray is negative. He is also complaining of shortness of breath 01/18/16; the area on his left anterior lower leg/ankle is resolved. The area over his first metatarsal head looks better. There is less maceration tissue here. Surgical debridement to remove surface slough and nonviable subcutaneous tissue however this generally appears  better. 01/25/16; patient arrives today with several areas which are small and superficial on the left leg. This may be from scratching which he admittedly does. The area over the left first plantar metatarsal head looks improved. There is no maceration surrounding skin looks normal. There is some epithelialization attempting to get over this wound. 02/01/16 the areas on the left leg have closed down. The area over the first plantar metatarsal head on the left looks stable to improved although there is some maceration around this. Switch to silver alginate/Aquacel Ag 02/08/16; the areas on his legs remain closed down. The area over the first plantar metatarsal head on the left has advancing epithelialization with only a small open area remaining. Patient always look short of breath when he comes to our clinic but he states his wheezing is improved. We switched to silver alginate last week due to maceration around the wound 4/4the area on his leg remains closed. The area over the left first plantar metatarsal head is fully epithelialized. Patient tells me he has new shoes coming for both areas prosthetic leg on the right than the draining fluid on the left, this wound is certainly not ready for that type of force as of yet 02/22/16; the left first plantar metatarsal head has again split wide open which is exceptionally disappointing. Macerated surrounding skin debridement with subcutaneous  tissue. Patient states he was exercising on an exercise bike, this friction over this area may of reopened this. DIONTA, LARKE ([id number]) 02/29/16 left first metatarsal head wound is improved. Requires debridement, I removed some macerated surrounding skin nonviable surface slough. It appears better 03/07/16 left first metatarsal head. Using Aquacel Ag. There is advancing epithelialization but fragile 03/14/16; left first metatarsal head presents with a non viable surface. Debridement is necessary . Subcutaneous tissue is not viable Objective Constitutional Vitals Time Taken: 10:51 AM, Height: 67 in, Weight: 195 lbs, BMI: 30.5, Temperature: 98.3 F, Pulse: 62 bpm, Respiratory Rate: 16 breaths/min, Blood Pressure: 140/61 mmHg. Integumentary (Hair, Skin) Wound #2 status is Open. Original cause of wound was Gradually Appeared. The wound is located on the Left,Plantar Foot. The wound measures 1cm length x 1cm width x 0.1cm depth; 0.785cm^2 area and 0.079cm^3 volume. The wound is limited to skin breakdown. There is no tunneling or undermining noted. There is a medium amount of serosanguineous drainage noted. The wound margin is distinct with the outline attached to the wound base. There is medium (34-66%) pink granulation within the wound bed. There is a small (1-33%) amount of necrotic tissue within the wound bed including Adherent Slough. The periwound skin appearance exhibited: Callus, Maceration, Moist. The periwound skin appearance did not exhibit: Crepitus, Excoriation, Fluctuance, Friable, Induration, Localized Edema, Rash, Scarring, Dry/Scaly, Atrophie Blanche, Cyanosis, Ecchymosis, Hemosiderin Staining, Mottled, Pallor, Rubor, Erythema. Periwound temperature was noted as No Abnormality. Assessment Active Problems ICD-10 L97.522 - Non-pressure chronic ulcer of other part of left foot with fat layer exposed E11.621 - Type 2 diabetes mellitus with foot ulcer E11.51 - Type 2 diabetes  mellitus with diabetic peripheral angiopathy without gangrene Procedures Bayman, Arafat D. ([id number]) Wound #2 Wound #2 is a Diabetic Wound/Ulcer of the Lower Extremity located on the Left,Plantar Foot . There was a Skin/Subcutaneous Tissue Debridement ([id number]) debridement with total area of 1 sq cm performed by [name], MD. with the following instrument(s): Curette to remove Non-Viable tissue/material including Fibrin/Slough and Subcutaneous after achieving pain control using Lidocaine 4% Topical Solution. A time out was conducted prior to the start  of the procedure. A Minimum amount of bleeding was controlled with Pressure. The procedure was tolerated well with a pain level of 0 throughout and a pain level of 0 following the procedure. Post Debridement Measurements: 1cm length x 1cm width x 0.2cm depth; 0.157cm^3 volume. Post procedure Diagnosis Wound #2: Same as Pre-Procedure Plan Wound Cleansing: Wound #2 Left,Plantar Foot: Clean wound with Normal Saline. Anesthetic: Wound #2 Left,Plantar Foot: Topical Lidocaine 4% cream applied to wound bed prior to debridement Primary Wound Dressing: Wound #2 Left,Plantar Foot: Iodoflex Secondary Dressing: Wound #2 Left,Plantar Foot: Gauze and Kerlix/Conform Dressing Change Frequency: Wound #2 Left,Plantar Foot: Change dressing every week - At [facility]. SKilled facility should not change dressing!!. Follow-up Appointments: Wound #2 Left,Plantar Foot: Return Appointment in 1 week. Off-Loading: Wound #2 Left,Plantar Foot: Open toe surgical shoe with peg assist. Other: - Felt Additional Orders / Instructions: Wound #2 Left,Plantar Foot: Increase protein intake. Activity as tolerated Bottenfield, Daquarius D. ([id number]) I have change the dressing to Iodoflex, felt and we will try to leave this in place all week rather than have the nursing home change this Electronic Signature(s) Signed: 03/15/2016 8:00:31 AM By: Baltazar Najjar  MD Entered By: Baltazar Najjar on 03/14/2016 13:49:44 Molinaro, Leilan D. ([id number]) -------------------------------------------------------------------------------- SuperBill Details Patient Name: Tyler Milo D. Date of Service: 03/14/2016 Medical Record Patient Account Number: [id number] Number: Treating RN: Clover Mealy, RN, BSN, Rita 08-22-1956 [phone] y.o. Other Clinician: Date of Birth/Sex: Male) Treating Ryonna Cimini, Sheron Primary Care Physician: Joen Laura Physician/Extender: G Referring Physician: Tedra Sen[place] in Treatment: 11 Diagnosis Coding ICD-10 Codes Code Description [phone] Non-pressure chronic ulcer of other part of left foot with fat layer exposed E11.621 Type 2 diabetes mellitus with foot ulcer E11.51 Type 2 diabetes mellitus with diabetic peripheral angiopathy without gangrene Facility Procedures CPT4 Code Description: [id number] 11042 - DEB SUBQ TISSUE 20 SQ CM/< ICD-10 Description Diagnosis L97.522 Non-pressure chronic ulcer of other part of left foot Modifier: with fat lay Quantity: 1 er exposed Physician Procedures CPT4 Code Description: [id number] 11042 - WC PHYS SUBQ TISS 20 SQ CM ICD-10 Description Diagnosis L97.522 Non-pressure chronic ulcer of other part of left foot Modifier: with fat laye Quantity: 1 r exposed Electronic Signature(s) Signed: 03/15/2016 8:00:31 AM By: Baltazar Najjar MD Entered By: Baltazar Najjar on 03/14/2016 13:59:21

## 2016-03-21 ENCOUNTER — Ambulatory Visit: Payer: Medicare Other | Admitting: Internal Medicine

## 2016-03-22 ENCOUNTER — Encounter: Payer: Medicare Other | Admitting: Internal Medicine

## 2016-03-22 DIAGNOSIS — E11621 Type 2 diabetes mellitus with foot ulcer: Secondary | ICD-10-CM | POA: Diagnosis not present

## 2016-03-23 ENCOUNTER — Other Ambulatory Visit
Admission: RE | Admit: 2016-03-23 | Discharge: 2016-03-23 | Disposition: A | Discharge status: Home / Self Care | Payer: Medicare Other | Source: Ambulatory Visit | Attending: Internal Medicine | Admitting: Internal Medicine

## 2016-03-23 DIAGNOSIS — L089 Local infection of the skin and subcutaneous tissue, unspecified: Secondary | ICD-10-CM | POA: Diagnosis present

## 2016-03-23 NOTE — Progress Notes (Signed)
[name], [name] ([id number]) Visit Report for 03/22/2016 Chief Complaint Document Details Patient Name: [name], [name]. Date of Service: 03/22/2016 2:15 PM Medical Record Patient Account Number: [id number] Number: Treating RN: Huel Coventry 03-Sep-1956 (60 y.o. Other Clinician: Date of Birth/Sex: Male) Treating Yaziel Brandon, Meir Primary Care Physician: Joen Laura Physician/Extender: G Referring Physician: Tedra Senegal in Treatment: 12 Information Obtained from: Patient Chief Complaint Patient is here for review of a wound on his left first metatarsal head that is been there for the last 6 months Electronic Signature(s) Signed: 03/22/2016 5:31:12 PM By: Baltazar Najjar MD Entered By: Baltazar Najjar on 03/22/2016 16:57:29 Burkey, Shemuel DMarland Kitchen ([id number]) -------------------------------------------------------------------------------- Debridement Details Patient Name: Tyler Milo D. Date of Service: 03/22/2016 2:15 PM Medical Record Patient Account Number: [id number] Number: Treating RN: Huel Coventry 18-Oct-1956 (60 y.o. Other Clinician: Date of Birth/Sex: Male) Treating Victorian Gunn, Tracer Primary Care Physician: Joen Laura Physician/Extender: G Referring Physician: Tedra Senegal in Treatment: 12 Debridement Performed for Wound #2 Left,Plantar Foot Assessment: Performed By: Physician [name], MD Debridement: Debridement Pre-procedure No Verification/Time Out Taken: Start Time: 15:02 Pain Control: Other : lidocaine 4% Level: Skin/Subcutaneous Tissue/Muscle Total Area Debrided (L x 0.5 (cm) x 0.7 (cm) = 0.35 (cm) W): Tissue and other Viable, Non-Viable, Callus, Exudate, Fibrin/Slough, Subcutaneous material debrided: Instrument: Curette Bleeding: Moderate Hemostasis Achieved: Pressure End Time: 15:05 Procedural Pain: 0 Post Procedural Pain: 0 Response to Treatment: Procedure was tolerated well Post Debridement Measurements of Total  Wound Length: (cm) 0.6 Width: (cm) 0.8 Depth: (cm) 0.3 Volume: (cm) 0.113 Post Procedure Diagnosis Same as Pre-procedure Electronic Signature(s) Signed: 03/22/2016 5:31:12 PM By: Baltazar Najjar MD Signed: 03/22/2016 5:57:40 PM By: Elliot Gurney RN, BSN, Kim RN, BSN Previous Signature: 03/22/2016 4:50:28 PM Version By: Elliot Gurney, RN, BSN, Kim RN, BSN Entered By: Baltazar Najjar on 03/22/2016 16:57:05 Huneke, Franchot D. ([id number]) Burright, Juris D. ([id number]) -------------------------------------------------------------------------------- HPI Details Patient Name: [name], [name] Date of Service: 03/22/2016 2:15 PM Medical Record Patient Account Number: [id number] Number: Treating RN: Huel Coventry [birth year] (60 y.o. Other Clinician: Date of Birth/Sex: Male) Treating Aarsh Fristoe, Abhijot Primary Care Physician: Joen Laura Physician/Extender: G Referring Physician: Tedra Senegal in Treatment: 12 History of Present Illness HPI Description: 12/28/15. This patient is a type II diabetic on insulin. He had vascular interventions 2 weeks ago on 1/17. This included an angioplasty of the left superficial femoral and popliteal arteries as well as a percutaneous transluminal angioplasty of the left posterior tibial artery. Nevertheless he is here for a plantar wound that the patient says is been there for the last 6 months. He initially told us that he is applying Neosporin to this however he tells me also that he has been in a nursing home for the last year. I found that to be a somewhat on usual combination of fax. In any case the patient is a diabetic with diabetic PAD. 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 His ABI on the left side today was 0.62 01/11/16; we have been using Prisma to the wound areas on these first metatarsal head on the left and the small wound on his left anterior ankle. He states that the area on his left metatarsal head was not  changed all week, this might account for why there is so much maceration here. In the meantime he tells me he has fallen and hurt his shoulder x-ray is negative. He is also complaining of shortness of breath 01/18/16; the  area on his left anterior lower leg/ankle is resolved. The area over his first metatarsal head looks better. There is less maceration tissue here. Surgical debridement to remove surface slough and nonviable subcutaneous tissue however this generally appears better. 01/25/16; patient arrives today with several areas which are small and superficial on the left leg. This may be from scratching which he admittedly does. The area over the left first plantar metatarsal head looks improved. There is no maceration surrounding skin looks normal. There is some epithelialization attempting to get over this wound. 02/01/16 the areas on the left leg have closed down. The area over the first plantar metatarsal head on the left looks stable to improved although there is some maceration around this. Switch to silver alginate/Aquacel Ag 02/08/16; the areas on his legs remain closed down. The area over the first plantar metatarsal head on the left has advancing epithelialization with only a small open area remaining. Patient always look short of breath when he comes to our clinic but he states his wheezing is improved. We switched to silver alginate last week due to maceration around the wound 4/4the area on his leg remains closed. The area over the left first plantar metatarsal head is fully epithelialized. Patient tells me he has new shoes coming for both areas prosthetic leg on the right than the draining fluid on the left, this wound is certainly not ready for that type of force as of yet 02/22/16; the left first plantar metatarsal head has again split wide open which is exceptionally disappointing. Macerated surrounding skin debridement with subcutaneous tissue. Patient states he was exercising  on an exercise bike, this friction over this area may of reopened this. 02/29/16 left first metatarsal head wound is improved. Requires debridement, I removed some macerated surrounding skin nonviable surface slough. It appears better 03/07/16 left first metatarsal head. Using Aquacel Ag. There is advancing epithelialization but fragile 03/14/16; left first metatarsal head presents with a non viable surface. Debridement is necessary . Subcutaneous tissue is not viable Orihuela, Bryn D. ([id number]) 03/22/16; left first metatarsal head again with a nonviable surface. I aggressively debridement this of nonviable subcutaneous tissue this appears to get down to a healthy base although it is deep. I did culture this wound no empiric antibiotics Electronic Signature(s) Signed: 03/22/2016 5:31:12 PM By: Baltazar Najjar MD Entered By: Baltazar Najjar on 03/22/2016 17:07:01 Smoots, Jakhari D. ([id number]) -------------------------------------------------------------------------------- Physical Exam Details Patient Name: Rybacki, Cordarious D. Date of Service: 03/22/2016 2:15 PM Medical Record Patient Account Number: [id number] Number: Treating RN: Huel Coventry 02/20/1956 (59 y.o. Other Clinician: Date of Birth/Sex: Male) Treating Indigo Barbian, Gavriel Primary Care Physician: Joen Laura Physician/Extender: G Referring Physician: Tedra Senegal in Treatment: 12 Notes Wound exam; patient arrives with his small wound over the metatarsal head with a fibrinous surface slough which was nonviable. There was also nonviable subcutaneous tissue all of this aggressively debridement no overt surrounding soft tissue infection was seen. Electronic Signature(s) Signed: 03/22/2016 5:31:12 PM By: Baltazar Najjar MD Entered By: Baltazar Najjar on 03/22/2016 17:09:11 Curless, [name] ([id number]) -------------------------------------------------------------------------------- Physician Orders Details Patient Name: Tyler Milo D. Date of Service: 03/22/2016 2:15 PM Medical Record Patient Account Number: [id number] Number: Treating RN: Huel Coventry [birth year] (59 y.o. Other Clinician: Date of Birth/Sex: Male) Treating Liani Caris, De Primary Care Physician: Joen Laura Physician/Extender: G Referring Physician: Tedra Senegal in Treatment: 12 Verbal / Phone Orders: Yes Clinician: Huel Coventry Read Back and Verified: Yes Diagnosis Coding Wound Cleansing Wound #2 Left,Plantar  Foot o Clean wound with Normal Saline. Anesthetic Wound #2 Left,Plantar Foot o Topical Lidocaine 4% cream applied to wound bed prior to debridement Primary Wound Dressing Wound #2 Left,Plantar Foot o Prisma Ag Secondary Dressing Wound #2 Left,Plantar Foot o Gauze and Kerlix/Conform Dressing Change Frequency Wound #2 Left,Plantar Foot o Other: - Change dressing on Monday. Prisma AG sent with patient. Follow-up Appointments Wound #2 Left,Plantar Foot o Return Appointment in 1 week. Off-Loading Wound #2 Left,Plantar Foot o Open toe surgical shoe with peg assist. o Other: - Felt Additional Orders / Instructions Wound #2 Left,Plantar Foot o Increase protein intake. o Activity as tolerated Like, Caedan D. ([id number]) Laboratory o Bacteria identified in Wound by Culture (MICRO) oooo LOINC Code: [phone] oooo Convenience Name: Wound culture routine Electronic Signature(s) Signed: 03/22/2016 4:50:28 PM By: Elliot Gurney RN, BSN, Kim RN, BSN Signed: 03/22/2016 5:31:12 PM By: Baltazar Najjar MD Entered By: Elliot Gurney, RN, BSN, Kim on 03/22/2016 15:11:34 Olheiser, [name] ([id number]) -------------------------------------------------------------------------------- Problem List Details Patient Name: JAKSEN, FIORELLA D. Date of Service: 03/22/2016 2:15 PM Medical Record Patient Account Number: [id number] Number: Treating RN: Huel Coventry 03-08-1956 (59 y.o. 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 ([id number]) -------------------------------------------------------------------------------- Progress Note Details Patient Name: Tyler Milo D. Date of Service: 03/22/2016 2:15 PM Medical Record Patient Account Number: [id number] Number: Treating RN: Huel Coventry May 10, 1956 (59 y.o. Other Clinician: Date of Birth/Sex: Male) Treating Luvern Mischke, Staci Primary Care Physician: Joen Laura Physician/Extender: G Referring Physician: Tedra Senegal in Treatment: 12 Subjective Chief Complaint Information obtained from Patient Patient is here for review of a wound on his left first metatarsal head that is been there for the last 6 months History of Present Illness (HPI) 12/28/15. This patient is a type II diabetic on insulin. He had vascular interventions 2 weeks ago on 1/17. This included an angioplasty of the left superficial femoral and popliteal arteries as well as a percutaneous transluminal angioplasty of the left posterior tibial artery. Nevertheless he is here for a plantar wound that the patient says is been there for the last 6 months. He initially told us that he is applying Neosporin to this however he tells me also that he has been in a nursing home for the last year. I found that to be a somewhat on usual combination  of fax. In any case the patient is a diabetic with diabetic PAD. 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 His ABI on the left side today was 0.62 01/11/16; we have been using Prisma to the wound areas on these first metatarsal head on the left and the small wound on his left anterior ankle. He states that the area on his left metatarsal head was not changed all week, this might account for why there is so much maceration here. In the meantime he tells me he has fallen and hurt his shoulder x-ray is negative. He is also complaining of shortness of breath 01/18/16;  the area on his left anterior lower leg/ankle is resolved. The area over his first metatarsal head looks better. There is less maceration tissue here. Surgical debridement to remove surface slough and nonviable subcutaneous tissue however this generally appears better. 01/25/16; patient arrives today with several areas which are small and superficial on the left leg. This may be from scratching which he admittedly does. The area over the left first plantar metatarsal head looks improved. There is no maceration surrounding skin looks normal. There is some epithelialization attempting to get over this wound. 02/01/16 the areas on the left leg have closed down. The area over the first plantar metatarsal head on the left looks stable to improved although there is some maceration around this. Switch to silver alginate/Aquacel Ag 02/08/16; the areas on his legs remain closed down. The area over the first plantar metatarsal head on the left has advancing epithelialization with only a small open area remaining. Patient always look short of breath when he comes to our clinic but he states his wheezing is improved. We switched to silver alginate last week due to maceration around the wound 4/4the area on his leg remains closed. The area over the left first plantar metatarsal head is fully epithelialized. Patient  tells me he has new shoes coming for both areas prosthetic leg on the right than the draining fluid on the left, this wound is certainly not ready for that type of force as of yet 02/22/16; the left first plantar metatarsal head has again split wide open which is exceptionally disappointing. Macerated surrounding skin debridement with subcutaneous tissue. Patient states he was exercising on an exercise bike, this friction over this area may of reopened this. Flora LippsHAM, Fahed D. ([id number]) 02/29/16 left first metatarsal head wound is improved. Requires debridement, I removed some macerated surrounding skin nonviable surface slough. It appears better 03/07/16 left first metatarsal head. Using Aquacel Ag. There is advancing epithelialization but fragile 03/14/16; left first metatarsal head presents with a non viable surface. Debridement is necessary . Subcutaneous tissue is not viable 03/22/16; left first metatarsal head again with a nonviable surface. I aggressively debridement this of nonviable subcutaneous tissue this appears to get down to a healthy base although it is deep. I did culture this wound no empiric antibiotics Objective Constitutional Vitals Time Taken: 2:45 PM, Height: 67 in, Weight: 195 lbs, BMI: 30.5, Temperature: 98.2 F, Pulse: 62 bpm, Respiratory Rate: 18 breaths/min, Blood Pressure: 132/72 mmHg. Integumentary (Hair, Skin) Wound #2 status is Open. Original cause of wound was Gradually Appeared. The wound is located on the Left,Plantar Foot. The wound measures 0.5cm length x 0.7cm width x 0.2cm depth; 0.275cm^2 area and 0.055cm^3 volume. The wound is limited to skin breakdown. There is a medium amount of serosanguineous drainage noted. The wound margin is distinct with the outline attached to the wound base. There is medium (34-66%) pink granulation within the wound bed. There is a small (1-33%) amount of necrotic tissue within the wound bed including Adherent Slough. The periwound  skin appearance exhibited: Callus, Maceration, Moist. The periwound skin appearance did not exhibit: Crepitus, Excoriation, Fluctuance, Friable, Induration, Localized Edema, Rash, Scarring, Dry/Scaly, Atrophie Blanche, Cyanosis, Ecchymosis, Hemosiderin Staining, Mottled, Pallor, Rubor, Erythema. Periwound temperature was noted as No Abnormality. Assessment Active Problems ICD-10 L97.522 - Non-pressure chronic ulcer of other part of left foot with fat layer exposed E11.621 - Type 2 diabetes mellitus with foot ulcer E11.51 - Type 2 diabetes mellitus with diabetic peripheral angiopathy without gangrene Reposa, Caesar D. ([id number])  Procedures Wound #2 Wound #2 is a Diabetic Wound/Ulcer of the Lower Extremity located on the Left,Plantar Foot . There was a Skin/Subcutaneous Tissue/Muscle Debridement ([id number]) debridement with total area of 0.35 sq cm performed by [name], MD. with the following instrument(s): Curette to remove Viable and Non-Viable tissue/material including Exudate, Fibrin/Slough, Callus, and Subcutaneous after achieving pain control using Other (lidocaine 4%). A time out was not conducted prior to the start of the procedure. A Moderate amount of bleeding was controlled with Pressure. The procedure was tolerated well with a pain level of 0 throughout and a pain level of 0 following the procedure. Post Debridement Measurements: 0.6cm length x 0.8cm width x 0.3cm depth; 0.113cm^3 volume. Post procedure Diagnosis Wound #2: Same as Pre-Procedure Plan Wound Cleansing: Wound #2 Left,Plantar Foot: Clean wound with Normal Saline. Anesthetic: Wound #2 Left,Plantar Foot: Topical Lidocaine 4% cream applied to wound bed prior to debridement Primary Wound Dressing: Wound #2 Left,Plantar Foot: Prisma Ag Secondary Dressing: Wound #2 Left,Plantar Foot: Gauze and Kerlix/Conform Dressing Change Frequency: Wound #2 Left,Plantar Foot: Other: - Change dressing on Monday.  Prisma AG sent with patient. Follow-up Appointments: Wound #2 Left,Plantar Foot: Return Appointment in 1 week. Off-Loading: Wound #2 Left,Plantar Foot: Open toe surgical shoe with peg assist. Other: - Felt Additional Orders / Instructions: Wound #2 Left,Plantar Foot: Increase protein intake. Activity as tolerated Laboratory ordered were: Wound culture routine Mruk, Oluwaseun D. ([id number]) #1 I applied Iodoflex last week although this didn't really seem to do anything any arrived with a very fibrinous nonviable surface with underlying nonviable subcutaneous tissue. All of this was aggressively debridement and I was eventually able to get down to a surface that looks quite stable albeit precariously close to his bone. I elected to change back to Prisma. With a Kerlix and Coniform wrap. The nursing home will change this once a. The patient is offloading his foot religiously according to him. #2 I did a culture of this wound but did not give him empiric antibiotics at this point Electronic Signature(s) Signed: 03/22/2016 5:31:12 PM By: Baltazar Najjar MD Entered By: Baltazar Najjar on 03/22/2016 17:11:33 Lallier, Steve D. ([id number]) -------------------------------------------------------------------------------- SuperBill Details Patient Name: Tyler Milo D. Date of Service: 03/22/2016 Medical Record Patient Account Number: [id number] Number: Treating RN: Huel Coventry 23-Jan-1956 (59 y.o. Other Clinician: Date of Birth/Sex: Male) Treating Othal Kubitz, Ladonte Primary Care Physician: Joen Laura Physician/Extender: G Referring Physician: Tedra Senegal in Treatment: 12 Diagnosis Coding ICD-10 Codes Code Description [phone] Non-pressure chronic ulcer of other part of left foot with fat layer exposed E11.621 Type 2 diabetes mellitus with foot ulcer E11.51 Type 2 diabetes mellitus with diabetic peripheral angiopathy without gangrene Facility Procedures CPT4 Code:  [id number] Description: 11043 - DEB MUSC/FASCIA 20 SQ CM/< ICD-10 Description Diagnosis E11.621 Type 2 diabetes mellitus with foot ulcer Modifier: Quantity: 1 Physician Procedures CPT4 Code: [id number] Description: 11043 - WC PHYS DEBR MUSCLE/FASCIA 20 SQ CM ICD-10 Description Diagnosis E11.621 Type 2 diabetes mellitus with foot ulcer Modifier: Quantity: 1 Electronic Signature(s) Signed: 03/22/2016 5:31:12 PM By: Baltazar Najjar MD Entered By: Baltazar Najjar on 03/22/2016 17:12:13

## 2016-03-23 NOTE — Progress Notes (Signed)
Tyler, Frank ([id number]) Visit Report for 03/22/2016 Arrival Information Details Patient Name: [name], [name]. Date of Service: 03/22/2016 2:15 PM Medical Record Number: [id number] Patient Account Number: [id number] Date of Birth/Sex: 05-06-1956 (60 y.o. Male) Treating RN: Huel Coventry Primary Care Physician: Joen Laura Other Clinician: Referring Physician: Joen Laura Treating Physician/Extender: Altamese [place] in Treatment: 12 Visit Information History Since Last Visit Added or deleted any medications: No Patient Arrived: Wheel Chair Any new allergies or adverse reactions: No Arrival Time: 14:44 Had a fall or experienced change in No activities of daily living that may affect Accompanied By: self risk of falls: Transfer Assistance: None Signs or symptoms of abuse/neglect since last No Patient Identification Verified: Yes visito Secondary Verification Process Yes Hospitalized since last visit: No Completed: Pain Present Now: No Patient Requires Transmission-Based No Precautions: Patient Has Alerts: No Electronic Signature(s) Signed: 03/22/2016 4:50:28 PM By: Elliot Gurney, RN, BSN, Kim RN, BSN Entered By: Elliot Gurney, RN, BSN, Kim on 03/22/2016 14:45:14 [place]reau, [name] ([id number]) -------------------------------------------------------------------------------- Encounter Discharge Information Details Patient Name: Tyler Milo D. Date of Service: 03/22/2016 2:15 PM Medical Record Number: [id number] Patient Account Number: [id number] Date of Birth/Sex: [birth year] (60 y.o. Male) Treating RN: Huel Coventry Primary Care Physician: Joen Laura Other Clinician: Referring Physician: Joen Laura Treating Physician/Extender: Altamese [place] in Treatment: 12 Encounter Discharge Information Items Schedule Follow-up Appointment: No Medication Reconciliation completed No and provided to Patient/Care Toniette Devera: Provided on Clinical Summary of Care: 03/22/2016 Form Type  Recipient Paper Patient [facility] Electronic Signature(s) Signed: 03/22/2016 3:25:05 PM By: Gwenlyn Perking Entered By: Gwenlyn Perking on 03/22/2016 15:25:04 Frank, [name] ([id number]) -------------------------------------------------------------------------------- Lower Extremity Assessment Details Patient Name: [name], [name] Date of Service: 03/22/2016 2:15 PM Medical Record Number: [id number] Patient Account Number: [id number] Date of Birth/Sex: Aug 29, 1956 (60 y.o. Male) Treating RN: Huel Coventry Primary Care Physician: Joen Laura Other Clinician: Referring Physician: Joen Laura Treating Physician/Extender: [name] Weeks in Treatment: 12 Vascular Assessment Pulses: Posterior Tibial Dorsalis Pedis Palpable: [Left:Yes] Extremity colors, hair growth, and conditions: Extremity Color: [Left:Mottled] Hair Growth on Extremity: [Left:No] Temperature of Extremity: [Left:Warm] Capillary Refill: [Left:> 3 seconds] Toe Nail Assessment Left: Right: Thick: Yes Discolored: No Deformed: No Improper Length and Hygiene: No Electronic Signature(s) Signed: 03/22/2016 4:50:28 PM By: Elliot Gurney, RN, BSN, Kim RN, BSN Entered By: Elliot Gurney, RN, BSN, Kim on 03/22/2016 14:56:28 Frank, Tyler DMarland Kitchen ([id number]) -------------------------------------------------------------------------------- Multi Wound Chart Details Patient Name: Tyler Milo D. Date of Service: 03/22/2016 2:15 PM Medical Record Number: [id number] Patient Account Number: [id number] Date of Birth/Sex: 1956-10-06 (60 y.o. Male) Treating RN: Huel Coventry Primary Care Physician: Joen Laura Other Clinician: Referring Physician: Joen Laura Treating Physician/Extender: [name] Weeks in Treatment: 12 Vital Signs Height(in): 67 Pulse(bpm): 62 Weight(lbs): 195 Blood Pressure 132/72 (mmHg): Body Mass Index(BMI): 31 Temperature(F): 98.2 Respiratory Rate 18 (breaths/min): Photos: [2:No Photos] [N/A:N/A] Wound Location: [2:Left  Foot - Plantar] [N/A:N/A] Wounding Event: [2:Gradually Appeared] [N/A:N/A] Primary Etiology: [2:Diabetic Wound/Ulcer of the Lower Extremity] [N/A:N/A] Comorbid History: [2:Anemia, Chronic Obstructive Pulmonary Disease (COPD), Arrhythmia, Congestive Heart Failure, Coronary Artery Disease, Hypertension, Type II Diabetes, Neuropathy] [N/A:N/A] Date Acquired: [2:12/21/2014] [N/A:N/A] Weeks of Treatment: [2:12] [N/A:N/A] Wound Status: [2:Open] [N/A:N/A] Measurements L x W x D 0.5x0.7x0.2 [N/A:N/A] (cm) Area (cm) : [2:0.275] [N/A:N/A] Volume (cm) : [2:0.055] [N/A:N/A] % Reduction in Area: [2:65.00%] [N/A:N/A] % Reduction in Volume: 76.70% [N/A:N/A] Classification: [2:Grade 1] [N/A:N/A] Exudate Amount: [2:Medium] [N/A:N/A] Exudate Type: [2:Serosanguineous] [N/A:N/A] Exudate Color: [2:Tyler, brown] [N/A:N/A] Wound Margin: [2:Distinct, outline attached] [N/A:N/A] Granulation  Amount: [2:Medium (34-66%)] [N/A:N/A] Granulation Quality: [2:Pink] [N/A:N/A] Necrotic Amount: [2:Small (1-33%)] [N/A:N/A] Exposed Structures: Fascia: No N/A N/A Fat: No Tendon: No Muscle: No Joint: No Bone: No Limited to Skin Breakdown Epithelialization: Medium (34-66%) N/A N/A Periwound Skin Texture: Callus: Yes N/A N/A Edema: No Excoriation: No Induration: No Crepitus: No Fluctuance: No Friable: No Rash: No Scarring: No Periwound Skin Maceration: Yes N/A N/A Moisture: Moist: Yes Dry/Scaly: No Periwound Skin Color: Atrophie Blanche: No N/A N/A Cyanosis: No Ecchymosis: No Erythema: No Hemosiderin Staining: No Mottled: No Pallor: No Rubor: No Temperature: No Abnormality N/A N/A Tenderness on No N/A N/A Palpation: Wound Preparation: Ulcer Cleansing: N/A N/A Rinsed/Irrigated with Saline Topical Anesthetic Applied: Other: lidocaine 4% Treatment Notes Electronic Signature(s) Signed: 03/22/2016 4:50:28 PM By: Elliot Gurney, RN, BSN, Kim RN, BSN Entered By: Elliot Gurney, RN, BSN, Kim on 03/22/2016 14:59:07 [name],  [name] ([id number]) -------------------------------------------------------------------------------- Multi-Disciplinary Care Plan Details Patient Name: Tyler Milo D. Date of Service: 03/22/2016 2:15 PM Medical Record Number: [id number] Patient Account Number: [id number] Date of Birth/Sex: 15-Jan-1956 (60 y.o. Male) Treating RN: Huel Coventry Primary Care Physician: Joen Laura Other Clinician: Referring Physician: Joen Laura Treating Physician/Extender: Altamese [employer] in Treatment: 12 Active Inactive Abuse / Safety / Falls / Self Care Management Nursing Diagnoses: Impaired home maintenance Impaired physical mobility Knowledge deficit related to: safety; personal, health (wound), emergency Potential for falls Self care deficit: actual or potential Goals: Patient will remain injury free Date Initiated: 12/28/2015 Goal Status: Active Patient/caregiver will verbalize understanding of skin care regimen Date Initiated: 12/28/2015 Goal Status: Active Patient/caregiver will verbalize/demonstrate measure taken to improve self care Date Initiated: 12/28/2015 Goal Status: Active Patient/caregiver will verbalize/demonstrate measures taken to improve the patient's personal safety Date Initiated: 12/28/2015 Goal Status: Active Patient/caregiver will verbalize/demonstrate measures taken to prevent injury and/or falls Date Initiated: 12/28/2015 Goal Status: Active Patient/caregiver will verbalize/demonstrate understanding of what to do in case of emergency Date Initiated: 12/28/2015 Goal Status: Active Interventions: Assess fall risk on admission and as needed Assess: immobility, friction, shearing, incontinence upon admission and as needed Assess impairment of mobility on admission and as needed per policy Assess self care needs on admission and as needed Provide education on basic hygiene [name], [name] ([id number]) Provide education on fall prevention Provide education on  personal and home safety Provide education on safe transfers Treatment Activities: Education provided on Basic Hygiene : 03/14/2016 Notes: Orientation to the Wound Care Program Nursing Diagnoses: Knowledge deficit related to the wound healing center program Goals: Patient/caregiver will verbalize understanding of the Wound Healing Center Program Date Initiated: 12/28/2015 Goal Status: Active Interventions: Provide education on orientation to the wound center Notes: Venous Leg Ulcer Nursing Diagnoses: Knowledge deficit related to disease process and management Potential for venous Insuffiency (use before diagnosis confirmed) Goals: Non-invasive venous studies are completed as ordered Date Initiated: 12/28/2015 Goal Status: Active Patient/caregiver will verbalize understanding of disease process and disease management Date Initiated: 12/28/2015 Goal Status: Active Verify adequate tissue perfusion prior to therapeutic compression application Date Initiated: 12/28/2015 Goal Status: Active Interventions: Assess peripheral edema status every visit. Provide education on venous insufficiency Notes: [name], [name] ([id number]) Wound/Skin Impairment Nursing Diagnoses: Impaired tissue integrity Knowledge deficit related to smoking impact on wound healing Knowledge deficit related to ulceration/compromised skin integrity Goals: Patient/caregiver will verbalize understanding of skin care regimen Date Initiated: 12/28/2015 Goal Status: Active Ulcer/skin breakdown will have a volume reduction of 30% by week 4 Date Initiated: 12/28/2015 Goal Status: Active Ulcer/skin breakdown will have a volume reduction  of 50% by week 8 Date Initiated: 12/28/2015 Goal Status: Active Ulcer/skin breakdown will have a volume reduction of 80% by week 12 Date Initiated: 12/28/2015 Goal Status: Active Ulcer/skin breakdown will heal within 14 weeks Date Initiated: 12/28/2015 Goal Status:  Active Interventions: Assess patient/caregiver ability to obtain necessary supplies Assess patient/caregiver ability to perform ulcer/skin care regimen upon admission and as needed Assess ulceration(s) every visit Provide education on ulcer and skin care Notes: Electronic Signature(s) Signed: 03/22/2016 4:50:28 PM By: Elliot GurneyWoody, RN, BSN, Kim RN, BSN Entered By: Elliot GurneyWoody, RN, BSN, Kim on 03/22/2016 14:58:58 [name], Tyler Marland Kitchen. ([id number]) -------------------------------------------------------------------------------- Pain Assessment Details Patient Name: [name], [name] Date of Service: 03/22/2016 2:15 PM Medical Record Number: [id number] Patient Account Number: [id number] Date of Birth/Sex: 1956-08-10 68(59 y.o. Male) Treating RN: Huel CoventryWoody, Kim Primary Care Physician: Joen LauraBLISS, LAURA Other Clinician: Referring Physician: Joen LauraBLISS, LAURA Treating Physician/Extender: Altamese CarolinaOBSON, Jennings G Weeks in Treatment: 12 Active Problems Location of Pain Severity and Description of Pain Patient Has Paino No Site Locations Pain Management and Medication Current Pain Management: Electronic Signature(s) Signed: 03/22/2016 4:50:28 PM By: Elliot GurneyWoody, RN, BSN, Kim RN, BSN Entered By: Elliot GurneyWoody, RN, BSN, Kim on 03/22/2016 14:45:21 [name], [name] ([id number]) -------------------------------------------------------------------------------- Wound Assessment Details Patient Name: [name], Kaion D. Date of Service: 03/22/2016 2:15 PM Medical Record Number: [id number] Patient Account Number: [id number] Date of Birth/Sex: 1956-08-10 1(59 y.o. Male) Treating RN: Huel CoventryWoody, Kim Primary Care Physician: Joen LauraBLISS, LAURA Other Clinician: Referring Physician: Joen LauraBLISS, LAURA Treating Physician/Extender: Maxwell CaulOBSON, Aviraj G Weeks in Treatment: 12 Wound Status Wound Number: 2 Primary Diabetic Wound/Ulcer of the Lower Etiology: Extremity Wound Location: Left Foot - Plantar Wound Open Wounding Event: Gradually Appeared Status: Date Acquired:  12/21/2014 Comorbid Anemia, Chronic Obstructive Pulmonary Weeks Of Treatment: 12 History: Disease (COPD), Arrhythmia, Clustered Wound: No Congestive Heart Failure, Coronary Artery Disease, Hypertension, Type II Diabetes, Neuropathy Photos Photo Uploaded By: Elliot GurneyWoody, RN, BSN, Kim on 03/22/2016 17:56:37 Wound Measurements Length: (cm) 0.5 Width: (cm) 0.7 Depth: (cm) 0.2 Area: (cm) 0.275 Volume: (cm) 0.055 % Reduction in Area: 65% % Reduction in Volume: 76.7% Epithelialization: Medium (34-66%) Wound Description Classification: Grade 1 Wound Margin: Distinct, outline attached Exudate Amount: Medium Exudate Type: Serosanguineous Exudate Color: Tyler, brown Foul Odor After Cleansing: No Wound Bed Granulation Amount: Medium (34-66%) Exposed Structure Granulation Quality: Pink Fascia Exposed: No [name], [name] ([id number]) Necrotic Amount: Small (1-33%) Fat Layer Exposed: No Necrotic Quality: Adherent Slough Tendon Exposed: No Muscle Exposed: No Joint Exposed: No Bone Exposed: No Limited to Skin Breakdown Periwound Skin Texture Texture Color No Abnormalities Noted: No No Abnormalities Noted: No Callus: Yes Atrophie Blanche: No Crepitus: No Cyanosis: No Excoriation: No Ecchymosis: No Fluctuance: No Erythema: No Friable: No Hemosiderin Staining: No Induration: No Mottled: No Localized Edema: No Pallor: No Rash: No Rubor: No Scarring: No Temperature / Pain Moisture Temperature: No Abnormality No Abnormalities Noted: No Dry / Scaly: No Maceration: Yes Moist: Yes Wound Preparation Ulcer Cleansing: Rinsed/Irrigated with Saline Topical Anesthetic Applied: Other: lidocaine 4%, Treatment Notes Wound #2 (Left, Plantar Foot) 1. Cleansed with: Clean wound with Normal Saline 2. Anesthetic Topical Lidocaine 4% cream to wound bed prior to debridement 4. Dressing Applied: Prisma Ag 5. Secondary Dressing Applied ABD Pad 9. Other Orders Culture obtained per  physician order Notes [employer], [employer] and [employer] Electronic Signature(s) Signed: 03/22/2016 4:50:28 PM By: Elliot GurneyWoody, RN, BSN, Kim RN, BSN [name], [name] ([id number]) Entered By: Elliot GurneyWoody, RN, BSN, Kim on 03/22/2016 14:56:58 Lacerte, [name] ([id number]) -------------------------------------------------------------------------------- Vitals Details Patient Name: [name], Jaccob D.  Date of Service: 03/22/2016 2:15 PM Medical Record Number: [id number] Patient Account Number: [id number] Date of Birth/Sex: 25-Feb-1956 (60 y.o. Male) Treating RN: Huel Coventry Primary Care Physician: Joen Laura Other Clinician: Referring Physician: Joen Laura Treating Physician/Extender: [name] Weeks in Treatment: 12 Vital Signs Time Taken: 14:45 Temperature (F): 98.2 Height (in): 67 Pulse (bpm): 62 Weight (lbs): 195 Respiratory Rate (breaths/min): 18 Body Mass Index (BMI): 30.5 Blood Pressure (mmHg): 132/72 Reference Range: 80 - 120 mg / dl Electronic Signature(s) Signed: 03/22/2016 4:50:28 PM By: Elliot Gurney, RN, BSN, Kim RN, BSN Entered By: Elliot Gurney, RN, BSN, Kim on 03/22/2016 14:46:03

## 2016-03-26 LAB — WOUND CULTURE: GRAM STAIN: NONE SEEN

## 2016-03-29 ENCOUNTER — Encounter: Payer: Medicare Other | Admitting: Internal Medicine

## 2016-03-29 DIAGNOSIS — E11621 Type 2 diabetes mellitus with foot ulcer: Secondary | ICD-10-CM | POA: Diagnosis not present

## 2016-03-30 NOTE — Progress Notes (Signed)
[name], [name] ([id number]) Visit Report for 03/29/2016 Arrival Information Details Patient Name: [name], [name] Date of Service: 03/29/2016 8:00 AM Medical Record Number: [id number] Patient Account Number: [id number] Date of Birth/Sex: [birth year] 7(59 y.o. Male) Treating RN: [name] Primary Care Physician: [name], LAURA Other Clinician: Referring Physician: Joen Frank, LAURA Treating Physician/Extender: [name], [name] in Treatment: 13 Visit Information History Since Last Visit All ordered tests and consults were completed: No Patient Arrived: Wheel Chair Added or deleted any medications: No Arrival Time: 08:06 Any new allergies or adverse reactions: No Accompanied By: self Had a fall or experienced change in No activities of daily living that may affect Transfer Assistance: None risk of falls: Patient Identification Verified: Yes Signs or symptoms of abuse/neglect since last No Secondary Verification Process Yes visito Completed: Hospitalized since last visit: No Patient Requires Transmission-Based No Pain Present Now: No Precautions: Patient Has Alerts: No Electronic Signature(s) Signed: 03/29/2016 5:19:35 PM By: [name] Entered By: [name] on 03/29/2016 08:06:48 [name], [name] ([id number]) -------------------------------------------------------------------------------- Encounter Discharge Information Details Patient Name: [name], [name] Date of Service: 03/29/2016 8:00 AM Medical Record Number: [id number] Patient Account Number: [id number] Date of Birth/Sex: [birth year](59 y.o. Male) Treating RN: [name] Primary Care Physician: [name], LAURA Other Clinician: Referring Physician: Joen Frank, LAURA Treating Physician/Extender: [name], Yamen G Frank in Treatment: 13 Encounter Discharge Information Items Discharge Pain Level: 0 Discharge Condition: Stable Ambulatory Status: Wheelchair Discharge Destination: Nursing Home Transportation:  Other Accompanied By: self Schedule Follow-up Appointment: Yes Medication Reconciliation completed and provided to Patient/Care Yes [name]: Provided on Clinical Summary of Care: 03/29/2016 Form Type [facility]ipient Paper [facility] Signature(s) Signed: 03/29/2016 8:59:03 AM By: [name] Entered By: [name] on 03/29/2016 08:59:03 Harpenau, [name] ([id number]) -------------------------------------------------------------------------------- Lower Extremity Assessment Details Patient Name: [name], [name] Date of Service: 03/29/2016 8:00 AM Medical Record Number: [id number] Patient Account Number: [id number] Date of Birth/Sex: [birth year](59 y.o. Male) Treating RN: [name] Primary Care Physician: [name], LAURA Other Clinician: Referring Physician: Joen Frank, LAURA Treating Physician/Extender: [name] in Treatment: 13 Vascular Assessment Pulses: Posterior Tibial Dorsalis Pedis Palpable: [Left:Yes] Extremity colors, hair growth, and conditions: Extremity Color: [Left:Mottled] Temperature of Extremity: [Left:Warm] Capillary Refill: [Left:< 3 seconds] Electronic Signature(s) Signed: 03/29/2016 5:19:35 PM By: [name] Entered By: [name] on 03/29/2016 08:10:04 [name], [name] ([id number]) -------------------------------------------------------------------------------- Multi Wound Chart Details Patient Name: [name], [name] Date of Service: 03/29/2016 8:00 AM Medical Record Number: [id number] Patient Account Number: [id number] Date of Birth/Sex: [birth year](59 y.o. Male) Treating RN: [name] Primary Care Physician: [name], LAURA Other Clinician: Referring Physician: Joen Frank, LAURA Treating Physician/Extender: [name] in Treatment: 13 Vital Signs Height(in): 67 Pulse(bpm): 63 Weight(lbs): 195 Blood Pressure 165/63 (mmHg): Body Mass Index(BMI): 31 Temperature(F): 98.0 Respiratory  Rate 18 (breaths/min): Photos: [2:No Photos] [N/A:N/A] Wound Location: [2:Left Foot - Plantar] [N/A:N/A] Wounding Event: [2:Gradually Appeared] [N/A:N/A] Primary Etiology: [2:Diabetic Wound/Ulcer of the Lower Extremity] [N/A:N/A] Comorbid History: [2:Anemia, Chronic Obstructive Pulmonary Disease (COPD), Arrhythmia, Congestive Heart Failure, Coronary Artery Disease, Hypertension, Type II Diabetes, Neuropathy] [N/A:N/A] Date Acquired: [2:12/21/2014] [N/A:N/A] Frank of Treatment: [2:13] [N/A:N/A] Wound Status: [2:Open] [N/A:N/A] Measurements L x W x D 0.5x0.5x0.2 [N/A:N/A] (cm) Area (cm) : [2:0.196] [N/A:N/A] Volume (cm) : [2:0.039] [N/A:N/A] % Reduction in Area: [2:75.00%] [N/A:N/A] % Reduction in Volume: 83.50% [N/A:N/A] Classification: [2:Grade 1] [N/A:N/A] Exudate Amount: [2:Medium] [N/A:N/A] Exudate Type: [2:Purulent] [N/A:N/A] Exudate Color: [2:yellow, brown, green] [N/A:N/A] Wound Margin: [2:Distinct, outline attached] [N/A:N/A] Granulation Amount: [2:Medium (34-66%)] [N/A:N/A] Granulation  Quality: [2:Pink] [N/A:N/A] Necrotic Amount: [2:Small (1-33%)] [N/A:N/A] Exposed Structures: Fascia: No N/A N/A Fat: No Tendon: No Muscle: No Joint: No Bone: No Limited to Skin Breakdown Epithelialization: Medium (34-66%) N/A N/A Periwound Skin Texture: Callus: Yes N/A N/A Edema: No Excoriation: No Induration: No Crepitus: No Fluctuance: No Friable: No Rash: No Scarring: No Periwound Skin Maceration: Yes N/A N/A Moisture: Moist: Yes Dry/Scaly: No Periwound Skin Color: Atrophie Blanche: No N/A N/A Cyanosis: No Ecchymosis: No Erythema: No Hemosiderin Staining: No Mottled: No Pallor: No Rubor: No Temperature: No Abnormality N/A N/A Tenderness on No N/A N/A Palpation: Wound Preparation: Ulcer Cleansing: N/A N/A Rinsed/Irrigated with Saline Topical Anesthetic Applied: Other: lidocaine 4% Treatment Notes Electronic Signature(s) Signed: 03/29/2016 5:19:35 PM By:  Tyler Mulling Entered By: Tyler Mulling on 03/29/2016 08:16:57 [name], [name] ([id number]) -------------------------------------------------------------------------------- Multi-Disciplinary Care Plan Details Patient Name: Tyler Milo D. Date of Service: 03/29/2016 8:00 AM Medical Record Number: [id number] Patient Account Number: [id number] Date of Birth/Sex: [birth year] (60 y.o. Male) Treating RN: Tyler Haggis Primary Care Physician: Tyler Laura Other Clinician: Referring Physician: Joen Laura Treating Physician/Extender: Tyler  in Treatment: 13 Active Inactive Abuse / Safety / Falls / Self Care Management Nursing Diagnoses: Impaired home maintenance Impaired physical mobility Knowledge deficit related to: safety; personal, health (wound), emergency Potential for falls Self care deficit: actual or potential Goals: Patient Tyler remain injury free Date Initiated: 12/28/2015 Goal Status: Active Patient/caregiver Tyler verbalize understanding of skin care regimen Date Initiated: 12/28/2015 Goal Status: Active Patient/caregiver Tyler verbalize/demonstrate measure taken to improve self care Date Initiated: 12/28/2015 Goal Status: Active Patient/caregiver Tyler verbalize/demonstrate measures taken to improve the patient's personal safety Date Initiated: 12/28/2015 Goal Status: Active Patient/caregiver Tyler verbalize/demonstrate measures taken to prevent injury and/or falls Date Initiated: 12/28/2015 Goal Status: Active Patient/caregiver Tyler verbalize/demonstrate understanding of what to do in case of emergency Date Initiated: 12/28/2015 Goal Status: Active Interventions: Assess fall risk on admission and as needed Assess: immobility, friction, shearing, incontinence upon admission and as needed Assess impairment of mobility on admission and as needed per policy Assess self care needs on admission and as needed Provide education on basic hygiene KAZ, AULD ([id number]) Provide education on fall prevention Provide education on personal and home safety Provide education on safe transfers Treatment Activities: Education provided on Basic Hygiene : 03/07/2016 Notes: Orientation to the Wound Care Program Nursing Diagnoses: Knowledge deficit related to the wound healing center program Goals: Patient/caregiver Tyler verbalize understanding of the Wound Healing Center Program Date Initiated: 12/28/2015 Goal Status: Active Interventions: Provide education on orientation to the wound center Notes: Venous Leg Ulcer Nursing Diagnoses: Knowledge deficit related to disease process and management Potential for venous Insuffiency (use before diagnosis confirmed) Goals: Non-invasive venous studies are completed as ordered Date Initiated: 12/28/2015 Goal Status: Active Patient/caregiver Tyler verbalize understanding of disease process and disease management Date Initiated: 12/28/2015 Goal Status: Active Verify adequate tissue perfusion prior to therapeutic compression application Date Initiated: 12/28/2015 Goal Status: Active Interventions: Assess peripheral edema status every visit. Provide education on venous insufficiency Notes: [name], [name] ([id number]) Wound/Skin Impairment Nursing Diagnoses: Impaired tissue integrity Knowledge deficit related to smoking impact on wound healing Knowledge deficit related to ulceration/compromised skin integrity Goals: Patient/caregiver Tyler verbalize understanding of skin care regimen Date Initiated: 12/28/2015 Goal Status: Active Ulcer/skin breakdown Tyler have a volume reduction of 30% by week 4 Date Initiated: 12/28/2015 Goal Status: Active Ulcer/skin breakdown Tyler have a volume reduction of 50% by week 8 Date Initiated: 12/28/2015 Goal Status: Active  Ulcer/skin breakdown Tyler have a volume reduction of 80% by week 12 Date Initiated: 12/28/2015 Goal Status: Active Ulcer/skin breakdown Tyler heal  within 14 Frank Date Initiated: 12/28/2015 Goal Status: Active Interventions: Assess patient/caregiver ability to obtain necessary supplies Assess patient/caregiver ability to perform ulcer/skin care regimen upon admission and as needed Assess ulceration(s) every visit Provide education on ulcer and skin care Notes: Electronic Signature(s) Signed: 03/29/2016 5:19:35 PM By: Tyler Mulling Entered By: Tyler Mulling on 03/29/2016 08:16:51 [name], [name] ([id number]) -------------------------------------------------------------------------------- Pain Assessment Details Patient Name: Tyler Milo D. Date of Service: 03/29/2016 8:00 AM Medical Record Number: [id number] Patient Account Number: [id number] Date of Birth/Sex: [birth year] (60 y.o. Male) Treating RN: Tyler Haggis Primary Care Physician: Tyler Laura Other Clinician: Referring Physician: Joen Laura Treating Physician/Extender: Tyler Caul Frank in Treatment: 13 Active Problems Location of Pain Severity and Description of Pain Patient Has Paino No Site Locations Pain Management and Medication Current Pain Management: Electronic Signature(s) Signed: 03/29/2016 5:19:35 PM By: Tyler Mulling Entered By: Tyler Mulling on 03/29/2016 08:08:23 [name], [name] -------------------------------------------------------------------------------- Patient/Caregiver Education Details Patient Name: Tyler Milo D. Date of Service: 03/29/2016 8:00 AM Medical Record Number: [id number] Patient Account Number: [id number] Date of Birth/Gender: 13-Oct-1956 (60 y.o. Male) Treating RN: Tyler Haggis Primary Care Physician: Tyler Laura Other Clinician: Referring Physician: Joen Laura Treating Physician/Extender: Tyler [place] in Treatment: 13 Education Assessment Education Provided To: Patient Education Topics Provided Wound/Skin Impairment: Handouts: Other: facility to change dressing as  ordered Methods: Demonstration, Explain/Verbal Responses: State content correctly Electronic Signature(s) Signed: 03/29/2016 5:19:35 PM By: Tyler Mulling Entered By: Tyler Mulling on 03/29/2016 08:25:14 [name], [name] ([id number]) -------------------------------------------------------------------------------- Wound Assessment Details Patient Name: [name], [name] Date of Service: 03/29/2016 8:00 AM Medical Record Number: [id number] Patient Account Number: [id number] Date of Birth/Sex: 1956-07-04 (60 y.o. Male) Treating RN: Tyler Haggis Primary Care Physician: Tyler Laura Other Clinician: Referring Physician: Joen Laura Treating Physician/Extender: Tyler Caul Frank in Treatment: 13 Wound Status Wound Number: 2 Primary Diabetic Wound/Ulcer of the Lower Etiology: Extremity Wound Location: Left Foot - Plantar Wound Open Wounding Event: Gradually Appeared Status: Date Acquired: 12/21/2014 Comorbid Anemia, Chronic Obstructive Pulmonary Frank Of Treatment: 13 History: Disease (COPD), Arrhythmia, Clustered Wound: No Congestive Heart Failure, Coronary Artery Disease, Hypertension, Type II Diabetes, Neuropathy Photos Photo Uploaded By: Tyler Mulling on 03/29/2016 17:15:29 Wound Measurements Length: (cm) 0.5 Width: (cm) 0.5 Depth: (cm) 0.2 Area: (cm) 0.196 Volume: (cm) 0.039 % Reduction in Area: 75% % Reduction in Volume: 83.5% Epithelialization: Medium (34-66%) Tunneling: No Undermining: No Wound Description Classification: Grade 1 Wound Margin: Distinct, outline attached Exudate Amount: Medium Exudate Type: Purulent Exudate Color: yellow, brown, green Foul Odor After Cleansing: No Wound Bed Granulation Amount: Medium (34-66%) Exposed Structure Granulation Quality: Pink Fascia Exposed: No [name], [name] ([id number]) Necrotic Amount: Small (1-33%) Fat Layer Exposed: No Necrotic Quality: Adherent Slough Tendon Exposed: No Muscle Exposed:  No Joint Exposed: No Bone Exposed: No Limited to Skin Breakdown Periwound Skin Texture Texture Color No Abnormalities Noted: No No Abnormalities Noted: No Callus: Yes Atrophie Blanche: No Crepitus: No Cyanosis: No Excoriation: No Ecchymosis: No Fluctuance: No Erythema: No Friable: No Hemosiderin Staining: No Induration: No Mottled: No Localized Edema: No Pallor: No Rash: No Rubor: No Scarring: No Temperature / Pain Moisture Temperature: No Abnormality No Abnormalities Noted: No Dry / Scaly: No Maceration: Yes Moist: Yes Wound Preparation Ulcer Cleansing: Rinsed/Irrigated with Saline Topical Anesthetic Applied: Other: lidocaine 4%, Electronic Signature(s) Signed: 03/29/2016 5:19:35 PM By: Tyler Mulling  Entered By: Tyler Mulling on 03/29/2016 08:16:44 [name], Tyler DMarland Frank ([id number]) -------------------------------------------------------------------------------- Vitals Details Patient Name: Hofman, Arcangel D. Date of Service: 03/29/2016 8:00 AM Medical Record Number: [id number] Patient Account Number: [id number] Date of Birth/Sex: 08-25-1956 (60 y.o. Male) Treating RN: Tyler Cordia, Frank Primary Care Physician: Tyler Laura Other Clinician: Referring Physician: Joen Laura Treating Physician/Extender: Tyler Caul Frank in Treatment: 13 Vital Signs Time Taken: 08:09 Temperature (F): 98.0 Height (in): 67 Pulse (bpm): 63 Weight (lbs): 195 Respiratory Rate (breaths/min): 18 Body Mass Index (BMI): 30.5 Blood Pressure (mmHg): 165/63 Reference Range: 80 - 120 mg / dl Electronic Signature(s) Signed: 03/29/2016 5:19:35 PM By: Tyler Mulling Entered By: Tyler Mulling on 03/29/2016 08:09:39

## 2016-03-30 NOTE — Progress Notes (Signed)
OMARION, MINNEHAN ([id number]) Visit Report for 03/29/2016 Chief Complaint Document Details Patient Name: [name], [name]. Date of Service: 03/29/2016 8:00 AM Medical Record Patient Account Number: [id number] Number: Treating RN: Phillis Haggis 27-Apr-1956 (59 y.o. Other Clinician: Date of Birth/Sex: Male) Treating Hadassa Cermak, Asahd Primary Care Physician: Joen Laura Physician/Extender: G Referring Physician: Tedra Senegal in Treatment: 13 Information Obtained from: Patient Chief Complaint Patient is here for review of a wound on his left first metatarsal head that is been there for the last 6 months Electronic Signature(s) Signed: 03/30/2016 8:03:58 AM By: Baltazar Najjar MD Entered By: Baltazar Najjar on 03/29/2016 08:47:58 [name], [name] ([id number]) -------------------------------------------------------------------------------- Debridement Details Patient Name: Tyler Milo D. Date of Service: 03/29/2016 8:00 AM Medical Record Patient Account Number: [id number] Number: Treating RN: Phillis Haggis 1956-04-18 (59 y.o. Other Clinician: Date of Birth/Sex: Male) Treating Karuna Balducci, Kasean Primary Care Physician: Joen Laura Physician/Extender: G Referring Physician: Tedra Senegal in Treatment: 13 Debridement Performed for Wound #2 Left,Plantar Foot Assessment: Performed By: Physician [name], MD Debridement: Debridement Pre-procedure Yes Verification/Time Out Taken: Start Time: 08:33 Pain Control: Lidocaine 4% Topical Solution Level: Skin/Subcutaneous Tissue Total Area Debrided (L x 0.5 (cm) x 0.5 (cm) = 0.25 (cm) W): Tissue and other Viable, Non-Viable, Exudate, Fibrin/Slough, Subcutaneous material debrided: Instrument: Curette Bleeding: Minimum Hemostasis Achieved: Pressure End Time: 08:36 Procedural Pain: 0 Post Procedural Pain: 0 Response to Treatment: Procedure was tolerated well Post Debridement Measurements of Total  Wound Length: (cm) 0.5 Width: (cm) 0.5 Depth: (cm) 0.5 Volume: (cm) 0.098 Post Procedure Diagnosis Same as Pre-procedure Electronic Signature(s) Signed: 03/29/2016 5:19:35 PM By: Alejandro Mulling Signed: 03/30/2016 8:03:58 AM By: Baltazar Najjar MD Entered By: Baltazar Najjar on 03/29/2016 08:47:31 [name], [name] ([id number]) -------------------------------------------------------------------------------- HPI Details Patient Name: Tyler Milo D. Date of Service: 03/29/2016 8:00 AM Medical Record Patient Account Number: [id number] Number: Treating RN: Phillis Haggis 1956/01/28 (59 y.o. Other Clinician: Date of Birth/Sex: Male) Treating Hazelyn Kallen, Darrelle Primary Care Physician: Joen Laura Physician/Extender: G Referring Physician: Tedra Senegal in Treatment: 13 History of Present Illness HPI Description: 12/28/15. This patient is a type II diabetic on insulin. He had vascular interventions 2 weeks ago on 1/17. This included an angioplasty of the left superficial femoral and popliteal arteries as well as a percutaneous transluminal angioplasty of the left posterior tibial artery. Nevertheless he is here for a plantar wound that the patient says is been there for the last 6 months. He initially told us that he is applying Neosporin to this however he tells me also that he has been in a nursing home for the last year. I found that to be a somewhat on usual combination of fax. In any case the patient is a diabetic with diabetic PAD. 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 His ABI on the left side today was 0.62 01/11/16; we have been using Prisma to the wound areas on these first metatarsal head on the left and the small wound on his left anterior ankle. He states that the area on his left metatarsal head was not changed all week, this might account for why there is so much maceration here. In the meantime he tells me he has fallen  and hurt his shoulder x-ray is negative. He is also complaining of shortness of breath 01/18/16; the area on his left anterior lower leg/ankle is resolved. The area over his first metatarsal head looks better. There is less maceration  tissue here. Surgical debridement to remove surface slough and nonviable subcutaneous tissue however this generally appears better. 01/25/16; patient arrives today with several areas which are small and superficial on the left leg. This may be from scratching which he admittedly does. The area over the left first plantar metatarsal head looks improved. There is no maceration surrounding skin looks normal. There is some epithelialization attempting to get over this wound. 02/01/16 the areas on the left leg have closed down. The area over the first plantar metatarsal head on the left looks stable to improved although there is some maceration around this. Switch to silver alginate/Aquacel Ag 02/08/16; the areas on his legs remain closed down. The area over the first plantar metatarsal head on the left has advancing epithelialization with only a small open area remaining. Patient always look short of breath when he comes to our clinic but he states his wheezing is improved. We switched to silver alginate last week due to maceration around the wound 4/4the area on his leg remains closed. The area over the left first plantar metatarsal head is fully epithelialized. Patient tells me he has new shoes coming for both areas prosthetic leg on the right than the draining fluid on the left, this wound is certainly not ready for that type of force as of yet 02/22/16; the left first plantar metatarsal head has again split wide open which is exceptionally disappointing. Macerated surrounding skin debridement with subcutaneous tissue. Patient states he was exercising on an exercise bike, this friction over this area may of reopened this. 02/29/16 left first metatarsal head wound is  improved. Requires debridement, I removed some macerated surrounding skin nonviable surface slough. It appears better 03/07/16 left first metatarsal head. Using Aquacel Ag. There is advancing epithelialization but fragile 03/14/16; left first metatarsal head presents with a non viable surface. Debridement is necessary . Subcutaneous tissue is not viable [name], [name] ([id number]) 03/22/16; left first metatarsal head again with a nonviable surface. I aggressively debridement this of nonviable subcutaneous tissue this appears to get down to a healthy base although it is deep. I did culture this wound no empiric antibiotics 03/29/16; once again he comes in with a nonviable surface and purulent drainage of this wound. With simple debridement with a curet the tissue just. This and although this does not probe to bone it is precariously close with a depth of 0.5 cm. 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 Electronic Signature(s) Signed: 03/30/2016 8:03:58 AM By: Baltazar Najjar MD Entered By: Baltazar Najjar on 03/29/2016 08:48:50 [name], [name] ([id number]) -------------------------------------------------------------------------------- Physical Exam Details Patient Name: [name], [name] Date of Service: 03/29/2016 8:00 AM Medical Record Patient Account Number: [id number] Number: Treating RN: Phillis Haggis 1956-09-04 (59 y.o. Other Clinician: Date of Birth/Sex: Male) Treating Thurley Francesconi, Lavere Primary Care Physician: Joen Laura Physician/Extender: G Referring Physician: Tedra Senegal in Treatment: 13 Notes Wound exam; a small probing hole over the first metatarsal head of his remaining left foot. He arrived with purulent drainage not surprising given the MRSA last week. He is not systemically unwell Electronic Signature(s) Signed: 03/30/2016 8:03:58 AM By: Baltazar Najjar MD Entered By: Baltazar Najjar on 03/29/2016 08:50:20 [name], [name]  ([id number]) -------------------------------------------------------------------------------- Physician Orders Details Patient Name: Tyler Milo D. Date of Service: 03/29/2016 8:00 AM Medical Record Patient Account Number: [id number] Number: Treating RN: Phillis Haggis February 03, 1956 (59 y.o. Other Clinician: Date of Birth/Sex: Male) Treating Jacki Couse, Jachin Primary Care Physician: Joen Laura Physician/Extender: G Referring Physician: Joen Laura  Weeks in Treatment: 13 Verbal / Phone Orders: Yes Clinician: Ashok Cordia, Debi Read Back and Verified: Yes Diagnosis Coding Wound Cleansing Wound #2 Left,Plantar Foot o Clean wound with Normal Saline. Anesthetic Wound #2 Left,Plantar Foot o Topical Lidocaine 4% cream applied to wound bed prior to debridement Primary Wound Dressing Wound #2 Left,Plantar Foot o Aquacel Ag Secondary Dressing Wound #2 Left,Plantar Foot o Gauze and Kerlix/Conform o Foam Dressing Change Frequency Wound #2 Left,Plantar Foot o Change dressing every other day. Follow-up Appointments Wound #2 Left,Plantar Foot o Return Appointment in 1 week. Off-Loading Wound #2 Left,Plantar Foot o Open toe surgical shoe with peg assist. Additional Orders / Instructions Wound #2 Left,Plantar Foot o Increase protein intake. o Activity as tolerated Hosein, Breven D. ([id number]) Medications-please add to medication list. Wound #2 Left,Plantar Foot o P.O. Antibiotics - Septra DS po BID x 2 weeks o Other: - Vitamin C, Zinc, Multivitamin Radiology o Magnetic Resonance Imaging (MRI) - left foot with and without contrast Electronic Signature(s) Signed: 03/29/2016 5:19:35 PM By: Alejandro Mulling Signed: 03/30/2016 8:03:58 AM By: Baltazar Najjar MD Entered By: Alejandro Mulling on 03/29/2016 12:35:56 [name], [name] ([id number]) -------------------------------------------------------------------------------- Problem List Details Patient Name:  [name], [name] Date of Service: 03/29/2016 8:00 AM Medical Record Patient Account Number: [id number] Number: Treating RN: Phillis Haggis 29-Aug-1956 (59 y.o. 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 ([id number]) -------------------------------------------------------------------------------- Progress Note Details Patient Name: Tyler Milo D. Date of Service: 03/29/2016 8:00 AM Medical Record Patient Account Number: [id number] Number: Treating RN: Phillis Haggis 30-Nov-1955 (59 y.o. Other Clinician: Date of Birth/Sex: Male) Treating Cleota Pellerito, Johnjoseph Primary Care Physician: Joen Laura Physician/Extender: G Referring Physician: Tedra Sen[place] in Treatment: 13 Subjective Chief Complaint Information obtained from Patient Patient is here for review of a wound on his left first metatarsal head that is been there for the last 6 months History of Present Illness (HPI) 12/28/15. This patient is a type II diabetic on insulin. He had vascular interventions 2 weeks ago on 1/17. This included an angioplasty of the left superficial femoral and popliteal arteries as well as a percutaneous transluminal angioplasty of the left posterior tibial artery. Nevertheless he is here for a plantar wound that the patient says is  been there for the last 6 months. He initially told us that he is applying Neosporin to this however he tells me also that he has been in a nursing home for the last year. I found that to be a somewhat on usual combination of fax. In any case the patient is a diabetic with diabetic PAD. 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 His ABI on the left side today was 0.62 01/11/16; we have been using Prisma to the wound areas on these first metatarsal head on the left and the small wound on his left anterior ankle. He states that the area on his left metatarsal head was not changed all week, this might account for why  there is so much maceration here. In the meantime he tells me he has fallen and hurt his shoulder x-ray is negative. He is also complaining of shortness of breath 01/18/16; the area on his left anterior lower leg/ankle is resolved. The area over his first metatarsal head looks better. There is less maceration tissue here. Surgical debridement to remove surface slough and nonviable subcutaneous tissue however this generally appears better. 01/25/16; patient arrives today with several areas which are small and superficial on the left leg. This may be from scratching which he admittedly does. The area over the left first plantar metatarsal head looks improved. There is no maceration surrounding skin looks normal. There is some epithelialization attempting to get over this wound. 02/01/16 the areas on the left leg have closed down. The area over the first plantar metatarsal head on the left looks stable to improved although there is some maceration around this. Switch to silver alginate/Aquacel Ag 02/08/16; the areas on his legs remain closed down. The area over the first plantar metatarsal head on the left has advancing epithelialization with only a small open area remaining. Patient always look short of breath when he comes to our clinic but he states his  wheezing is improved. We switched to silver alginate last week due to maceration around the wound 4/4the area on his leg remains closed. The area over the left first plantar metatarsal head is fully epithelialized. Patient tells me he has new shoes coming for both areas prosthetic leg on the right than the draining fluid on the left, this wound is certainly not ready for that type of force as of yet 02/22/16; the left first plantar metatarsal head has again split wide open which is exceptionally disappointing. Macerated surrounding skin debridement with subcutaneous tissue. Patient states he was exercising on an exercise bike, this friction over this area may of reopened this. [name], COOTS ([id number]) 02/29/16 left first metatarsal head wound is improved. Requires debridement, I removed some macerated surrounding skin nonviable surface slough. It appears better 03/07/16 left first metatarsal head. Using Aquacel Ag. There is advancing epithelialization but fragile 03/14/16; left first metatarsal head presents with a non viable surface. Debridement is necessary . Subcutaneous tissue is not viable 03/22/16; left first metatarsal head again with a nonviable surface. I aggressively debridement this of nonviable subcutaneous tissue this appears to get down to a healthy base although it is deep. I did culture this wound no empiric antibiotics 03/29/16; once again he comes in with a nonviable surface and purulent drainage of this wound. With simple debridement with a curet the tissue just. This and although this does not probe to bone it is precariously close with a depth of 0.5 cm. 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 Objective Constitutional Vitals Time Taken: 8:09 AM, Height: 67 in, Weight: 195 lbs, BMI: 30.5, Temperature: 98.0 F, Pulse: 63 bpm, Respiratory Rate: 18 breaths/min, Blood Pressure: 165/63 mmHg. Integumentary (Hair, Skin) Wound #2 status is Open. Original cause of  wound was Gradually Appeared. The wound is located on the Left,Plantar Foot. The wound measures 0.5cm length x 0.5cm width x 0.2cm depth; 0.196cm^2 area and 0.039cm^3 volume. The wound is limited to skin breakdown. There is no tunneling or undermining noted. There is a medium amount of purulent drainage noted. The wound margin is distinct with the outline attached to the wound base. There is medium (34-66%) pink granulation within the wound bed. There is a small (1-33%) amount of necrotic tissue within the wound  bed including Adherent Slough. The periwound skin appearance exhibited: Callus, Maceration, Moist. The periwound skin appearance did not exhibit: Crepitus, Excoriation, Fluctuance, Friable, Induration, Localized Edema, Rash, Scarring, Dry/Scaly, Atrophie Blanche, Cyanosis, Ecchymosis, Hemosiderin Staining, Mottled, Pallor, Rubor, Erythema. Periwound temperature was noted as No Abnormality. Assessment Active Problems ICD-10 L97.522 - Non-pressure chronic ulcer of other part of left foot with fat layer exposed E11.621 - Type 2 diabetes mellitus with foot ulcer E11.51 - Type 2 diabetes mellitus with diabetic peripheral angiopathy without gangrene Bellard, Machael D. ([id number]) Procedures Wound #2 Wound #2 is a Diabetic Wound/Ulcer of the Lower Extremity located on the Left,Plantar Foot . There was a Skin/Subcutaneous Tissue Debridement [id number]) debridement with total area of 0.25 sq cm performed by Maxwell CaulOBSON, Amontae G, MD. with the following instrument(s): Curette to remove Viable and Non-Viable tissue/material including Exudate, Fibrin/Slough, and Subcutaneous after achieving pain control using Lidocaine 4% Topical Solution. A time out was conducted prior to the start of the procedure. A Minimum amount of bleeding was controlled with Pressure. The procedure was tolerated well with a pain level of 0 throughout and a pain level of 0 following the procedure. Post Debridement Measurements:  0.5cm length x 0.5cm width x 0.5cm depth; 0.098cm^3 volume. Post procedure Diagnosis Wound #2: Same as Pre-Procedure Plan o #1 we are going to start him on Septra DS 1 by mouth twice a day. The isolate is resistant to tetracycline. #2 I reviewed his radiology. We last x-rayed this foot in the nursing home [facility] in February this was negative. At this point I think he is going to need an MRI of his foot. His creatinine is within the normal range or slightly above it I don't think there is a contrast tissue #3 he has known diabetic angiopathy but I don't think this is the primary issue here. #4 I have changed his dressing to silver alginate/Aquacel Ag change every 2 days Electronic Signature(s) Signed: 03/30/2016 8:03:58 AM By: Baltazar Najjarobson, Jaking MD Entered By: Baltazar Najjarobson, Buren on 03/29/2016 08:54:16 Lembcke, Cauy D. ([id number]) -------------------------------------------------------------------------------- SuperBill Details Patient Name: Tyler MiloHAM, Jayel D. Date of Service: 03/29/2016 Medical Record Patient Account Number: [id number] Number: Treating RN: Phillis Haggisinkerton, Debi 10-Aug-1956 (59 y.o. Other Clinician: Date of Birth/Sex: Male) Treating Gwynne Kemnitz, Michial Primary Care Physician: Joen LauraBLISS, LAURA Physician/Extender: G Referring Physician: Tedra SenegalBLISS, LAURA Weeks in Treatment: 13 Diagnosis Coding ICD-10 Codes Code Description [phone] Non-pressure chronic ulcer of other part of left foot with fat layer exposed E11.621 Type 2 diabetes mellitus with foot ulcer E11.51 Type 2 diabetes mellitus with diabetic peripheral angiopathy without gangrene Facility Procedures CPT4 Code: [id number] Description: 11042 - DEB SUBQ TISSUE 20 SQ CM/< ICD-10 Description Diagnosis E11.621 Type 2 diabetes mellitus with foot ulcer Modifier: Quantity: 1 Physician Procedures CPT4 Code: [id number] Description: 11042 - WC PHYS SUBQ TISS 20 SQ CM ICD-10 Description Diagnosis E11.621 Type 2 diabetes mellitus  with foot ulcer Modifier: Quantity: 1 Electronic Signature(s) Signed: 03/30/2016 8:03:58 AM By: Baltazar Najjarobson, Braxen MD Entered By: Baltazar Najjarobson, Xiong on 03/29/2016 08:55:19

## 2016-03-31 ENCOUNTER — Other Ambulatory Visit: Payer: Self-pay | Admitting: Internal Medicine

## 2016-04-04 ENCOUNTER — Other Ambulatory Visit: Payer: Self-pay | Admitting: Internal Medicine

## 2016-04-04 DIAGNOSIS — S81802A Unspecified open wound, left lower leg, initial encounter: Secondary | ICD-10-CM

## 2016-04-05 ENCOUNTER — Encounter: Payer: Medicare Other | Admitting: Internal Medicine

## 2016-04-05 ENCOUNTER — Ambulatory Visit
Admission: RE | Admit: 2016-04-05 | Discharge: 2016-04-05 | Disposition: A | Discharge status: Home / Self Care | Payer: Medicare Other | Source: Ambulatory Visit | Attending: Internal Medicine | Admitting: Internal Medicine

## 2016-04-05 DIAGNOSIS — E119 Type 2 diabetes mellitus without complications: Secondary | ICD-10-CM | POA: Diagnosis not present

## 2016-04-05 DIAGNOSIS — S81802A Unspecified open wound, left lower leg, initial encounter: Secondary | ICD-10-CM | POA: Diagnosis not present

## 2016-04-05 DIAGNOSIS — Z89611 Acquired absence of right leg above knee: Secondary | ICD-10-CM | POA: Diagnosis not present

## 2016-04-05 DIAGNOSIS — R609 Edema, unspecified: Secondary | ICD-10-CM | POA: Diagnosis not present

## 2016-04-05 DIAGNOSIS — I4891 Unspecified atrial fibrillation: Secondary | ICD-10-CM | POA: Diagnosis not present

## 2016-04-05 DIAGNOSIS — I509 Heart failure, unspecified: Secondary | ICD-10-CM | POA: Diagnosis not present

## 2016-04-05 DIAGNOSIS — E11621 Type 2 diabetes mellitus with foot ulcer: Secondary | ICD-10-CM | POA: Diagnosis not present

## 2016-04-05 LAB — POCT I-STAT CREATININE: Creatinine, Ser: 1.3 mg/dL — ABNORMAL HIGH (ref 0.61–1.24)

## 2016-04-05 MED ORDER — GADOBENATE DIMEGLUMINE 529 MG/ML IV SOLN: 18.0000 mL | Freq: Once | INTRAVENOUS | Status: DC | PRN

## 2016-04-06 NOTE — Progress Notes (Signed)
THEADOR, JEZEWSKI ([id number]) Visit Report for 04/05/2016 Chief Complaint Document Details Patient Name: [name], [name]. Date of Service: 04/05/2016 8:45 AM Medical Record Patient Account Number: [id number] Number: Treating RN: Phillis Haggis February 23, 1956 (60 y.o. Other Clinician: Date of Birth/Sex: Male) Treating ROBSON, Yandel Primary Care Physician: Joen Laura Physician/Extender: G Referring Physician: Tedra Senegal in Treatment: 14 Information Obtained from: Patient Chief Complaint Patient is here for review of a wound on his left first metatarsal head that is been there for the last 6 months Electronic Signature(s) Signed: 04/05/2016 4:27:33 PM By: Baltazar Najjar MD Entered By: Baltazar Najjar on 04/05/2016 09:11:39 Vanbrocklin, Khaleel D. ([id number]) -------------------------------------------------------------------------------- Debridement Details Patient Name: [name] Date of Service: 04/05/2016 8:45 AM Medical Record Patient Account Number: [id number] Number: Treating RN: Phillis Haggis 1956-03-23 (60 y.o. Other Clinician: Date of Birth/Sex: Male) Treating ROBSON, Kairos Primary Care Physician: Joen Laura Physician/Extender: G Referring Physician: Tedra Senegal in Treatment: 14 Debridement Performed for Wound #2 Left,Plantar Foot Assessment: Performed By: Physician [name], MD Debridement: Debridement Pre-procedure Yes Verification/Time Out Taken: Start Time: 08:57 Pain Control: Lidocaine 4% Topical Solution Level: Skin/Subcutaneous Tissue Total Area Debrided (L x 0.7 (cm) x 0.5 (cm) = 0.35 (cm) W): Tissue and other Viable, Non-Viable, Exudate, Fibrin/Slough, Subcutaneous material debrided: Instrument: Curette Bleeding: Moderate Hemostasis Achieved: Silver Nitrate End Time: 09:02 Procedural Pain: 0 Post Procedural Pain: 0 Response to Treatment: Procedure was tolerated well Post Debridement Measurements of  Total Wound Length: (cm) 1 Width: (cm) 0.8 Depth: (cm) 0.3 Volume: (cm) 0.188 Post Procedure Diagnosis Same as Pre-procedure Electronic Signature(s) Signed: 04/05/2016 4:27:33 PM By: Baltazar Najjar MD Signed: 04/05/2016 5:05:09 PM By: Alejandro Mulling Entered By: Baltazar Najjar on 04/05/2016 09:11:23 Newlun, Calder D. ([id number]) -------------------------------------------------------------------------------- HPI Details Patient Name: [name] Date of Service: 04/05/2016 8:45 AM Medical Record Patient Account Number: [id number] Number: Treating RN: Phillis Haggis 1956-07-07 (60 y.o. Other Clinician: Date of Birth/Sex: Male) Treating ROBSON, Abdulraheem Primary Care Physician: Joen Laura Physician/Extender: G Referring Physician: Tedra Sen[place] in Treatment: 14 History of Present Illness HPI Description: 12/28/15. This patient is a type II diabetic on insulin. He had vascular interventions 2 weeks ago on 1/17. This included an angioplasty of the left superficial femoral and popliteal arteries as well as a percutaneous transluminal angioplasty of the left posterior tibial artery. Nevertheless he is here for a plantar wound that the patient says is been there for the last 6 months. He initially told us that he is applying Neosporin to this however he tells me also that he has been in a nursing home for the last year. I found that to be a somewhat on usual combination of fax. In any case the patient is a diabetic with diabetic PAD. 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 His ABI on the left side today was 0.62 01/11/16; we have been using Prisma to the wound areas on these first metatarsal head on the left and the small wound on his left anterior ankle. He states that the area on his left metatarsal head was not changed all week, this might account for why there is so much maceration here. In the meantime he tells me he  has fallen and hurt his shoulder x-ray is negative. He is also complaining of shortness of breath 01/18/16; the area on his left anterior lower leg/ankle is resolved. The area over his first metatarsal head looks better. There is less  maceration tissue here. Surgical debridement to remove surface slough and nonviable subcutaneous tissue however this generally appears better. 01/25/16; patient arrives today with several areas which are small and superficial on the left leg. This may be from scratching which he admittedly does. The area over the left first plantar metatarsal head looks improved. There is no maceration surrounding skin looks normal. There is some epithelialization attempting to get over this wound. 02/01/16 the areas on the left leg have closed down. The area over the first plantar metatarsal head on the left looks stable to improved although there is some maceration around this. Switch to silver alginate/Aquacel Ag 02/08/16; the areas on his legs remain closed down. The area over the first plantar metatarsal head on the left has advancing epithelialization with only a small open area remaining. Patient always look short of breath when he comes to our clinic but he states his wheezing is improved. We switched to silver alginate last week due to maceration around the wound 4/4the area on his leg remains closed. The area over the left first plantar metatarsal head is fully epithelialized. Patient tells me he has new shoes coming for both areas prosthetic leg on the right than the draining fluid on the left, this wound is certainly not ready for that type of force as of yet 02/22/16; the left first plantar metatarsal head has again split wide open which is exceptionally disappointing. Macerated surrounding skin debridement with subcutaneous tissue. Patient states he was exercising on an exercise bike, this friction over this area may of reopened this. 02/29/16 left first metatarsal head  wound is improved. Requires debridement, I removed some macerated surrounding skin nonviable surface slough. It appears better 03/07/16 left first metatarsal head. Using Aquacel Ag. There is advancing epithelialization but fragile 03/14/16; left first metatarsal head presents with a non viable surface. Debridement is necessary . Subcutaneous tissue is not viable Emory, Mohammed D. ([id number]) 03/22/16; left first metatarsal head again with a nonviable surface. I aggressively debridement this of nonviable subcutaneous tissue this appears to get down to a healthy base although it is deep. I did culture this wound no empiric antibiotics 03/29/16; once again he comes in with a nonviable surface and purulent drainage of this wound. With simple debridement with a curet the tissue just. This and although this does not probe to bone it is precariously close with a depth of 0.5 cm. 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 04/05/16; a fair amount of drainage which was against his skin under the foam-based dressing according to our intake nurse. continues on Septra for MRSA Electronic Signature(s) Signed: 04/05/2016 4:27:33 PM By: Baltazar Najjarobson, Jamir MD Entered By: Baltazar Najjarobson, Rinaldo on 04/05/2016 09:13:09 Raven, Veverly FellsMICHAEL D. ([id number]) -------------------------------------------------------------------------------- Physical Exam Details Patient Name: Erbes, Deandra D. Date of Service: 04/05/2016 8:45 AM Medical Record Patient Account Number: [id number] Number: Treating RN: Phillis Haggisinkerton, Debi 1956-03-01 (59 y.o. Other Clinician: Date of Birth/Sex: Male) Treating ROBSON, Tonny Primary Care Physician: Joen LauraBLISS, LAURA Physician/Extender: G Referring Physician: Tedra SenegalBLISS, LAURA Weeks in Treatment: 14 Notes Wound exam; wound is debridement of nonviable subcutaneous tissue. There is some epithelialization here and the surrounding skin looks better. This does not probe to bone. Awaiting MRI done later  today Electronic Signature(s) Signed: 04/05/2016 4:27:33 PM By: Baltazar Najjarobson, Keno MD Entered By: Baltazar Najjarobson, Djon on 04/05/2016 09:14:49 Dickard, Veverly FellsMICHAEL D. ([id number]) -------------------------------------------------------------------------------- Physician Orders Details Patient Name: [name], Cormac D. Date of Service: 04/05/2016 8:45 AM Medical Record Patient Account Number: [id number] Number: Treating RN: Ashok CordiaPinkerton,  Debi [birth year] (60 y.o. Other Clinician: Date of Birth/Sex: Male) Treating ROBSON, Blanchard Primary Care Physician: Joen Laura Physician/Extender: G Referring Physician: Tedra Senegal in Treatment: 14 Verbal / Phone Orders: Yes Clinician: Ashok Cordia, Debi Read Back and Verified: Yes Diagnosis Coding Wound Cleansing Wound #2 Left,Plantar Foot o Clean wound with Normal Saline. Anesthetic Wound #2 Left,Plantar Foot o Topical Lidocaine 4% cream applied to wound bed prior to debridement Primary Wound Dressing Wound #2 Left,Plantar Foot o Aquacel Ag - or silver alginate only Secondary Dressing Wound #2 Left,Plantar Foot o ABD pad o Conform/Kerlix - netting Dressing Change Frequency Wound #2 Left,Plantar Foot o Change dressing every other day. Follow-up Appointments Wound #2 Left,Plantar Foot o Return Appointment in 1 week. Off-Loading Wound #2 Left,Plantar Foot o Open toe surgical shoe with peg assist. Additional Orders / Instructions Wound #2 Left,Plantar Foot o Increase protein intake. o Activity as tolerated Westerhoff, Kobyn D. ([id number]) Medications-please add to medication list. Wound #2 Left,Plantar Foot o P.O. Antibiotics - Finish taking Septra DS o Other: - Vitamin C, Zinc, Multivitamin Electronic Signature(s) Signed: 04/05/2016 4:27:33 PM By: Baltazar Najjar MD Signed: 04/05/2016 5:05:09 PM By: Alejandro Mulling Entered By: Alejandro Mulling on 04/05/2016 09:16:05 Rathod, Kethan D.  ([id number]) -------------------------------------------------------------------------------- Problem List Details Patient Name: Amason, Ferrel D. Date of Service: 04/05/2016 8:45 AM Medical Record Patient Account Number: [id number] Number: Treating RN: Phillis Haggis 03/06/1956 (59 y.o. 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 ([id number]) -------------------------------------------------------------------------------- Progress Note Details Patient Name: [name] Date of Service: 04/05/2016 8:45 AM Medical Record Patient Account Number: [id number] Number: Treating RN: Phillis Haggis [birth year] (59 y.o. Other Clinician: Date of Birth/Sex: Male) Treating ROBSON, Taisei Primary Care Physician: Joen Laura Physician/Extender: G Referring Physician: Tedra [place] in Treatment: 14 Subjective Chief Complaint Information obtained from Patient Patient is here for review of a wound on his left first metatarsal head that is been there for the last 6 months History of Present Illness (HPI) 12/28/15. This patient is a type II diabetic on insulin. He had vascular interventions 2 weeks ago on 1/17. This included an angioplasty of the left superficial femoral and popliteal arteries as well as a  percutaneous transluminal angioplasty of the left posterior tibial artery. Nevertheless he is here for a plantar wound that the patient says is been there for the last 6 months. He initially told us that he is applying Neosporin to this however he tells me also that he has been in a nursing home for the last year. I found that to be a somewhat on usual combination of fax. In any case the patient is a diabetic with diabetic PAD. 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 His ABI on the left side today was 0.62 01/11/16; we have been using Prisma to the wound areas on these first metatarsal head on the left and the small wound on his left anterior ankle. He states that the area on  his left metatarsal head was not changed all week, this might account for why there is so much maceration here. In the meantime he tells me he has fallen and hurt his shoulder x-ray is negative. He is also complaining of shortness of breath 01/18/16; the area on his left anterior lower leg/ankle is resolved. The area over his first metatarsal head looks better. There is less maceration tissue here. Surgical debridement to remove surface slough and nonviable subcutaneous tissue however this generally appears better. 01/25/16; patient arrives today with several areas which are small and superficial on the left leg. This may be from scratching which he admittedly does. The area over the left first plantar metatarsal head looks improved. There is no maceration surrounding skin looks normal. There is some epithelialization attempting to get over this wound. 02/01/16 the areas on the left leg have closed down. The area over the first plantar metatarsal head on the left looks stable to improved although there is some maceration around this. Switch to silver alginate/Aquacel Ag 02/08/16; the areas on his legs remain closed down. The area over the first plantar metatarsal head on the left has  advancing epithelialization with only a small open area remaining. Patient always look short of breath when he comes to our clinic but he states his wheezing is improved. We switched to silver alginate last week due to maceration around the wound 4/4the area on his leg remains closed. The area over the left first plantar metatarsal head is fully epithelialized. Patient tells me he has new shoes coming for both areas prosthetic leg on the right than the draining fluid on the left, this wound is certainly not ready for that type of force as of yet 02/22/16; the left first plantar metatarsal head has again split wide open which is exceptionally disappointing. Macerated surrounding skin debridement with subcutaneous tissue. Patient states he was exercising on an exercise bike, this friction over this area may of reopened this. BRAEDEN, KENNAN ([id number]) 02/29/16 left first metatarsal head wound is improved. Requires debridement, I removed some macerated surrounding skin nonviable surface slough. It appears better 03/07/16 left first metatarsal head. Using Aquacel Ag. There is advancing epithelialization but fragile 03/14/16; left first metatarsal head presents with a non viable surface. Debridement is necessary . Subcutaneous tissue is not viable 03/22/16; left first metatarsal head again with a nonviable surface. I aggressively debridement this of nonviable subcutaneous tissue this appears to get down to a healthy base although it is deep. I did culture this wound no empiric antibiotics 03/29/16; once again he comes in with a nonviable surface and purulent drainage of this wound. With simple debridement with a curet the tissue just. This and although this does not probe to bone it is precariously close with a depth of 0.5 cm. 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 04/05/16; a fair amount of drainage which was against his skin under the foam-based dressing according to our intake nurse.  continues on Septra for MRSA Objective Constitutional Vitals Time Taken: 8:43 AM, Height: 67 in, Weight: 195 lbs, BMI: 30.5, Temperature: 98.2 F, Pulse: 66 bpm, Respiratory Rate: 18 breaths/min, Blood Pressure: 157/75 mmHg. Integumentary (Hair, Skin) Wound #2 status is Open. Original cause of wound was Gradually Appeared. The wound is located on the Left,Plantar Foot. The wound measures 0.7cm length x 0.5cm width x 0.2cm depth; 0.275cm^2 area and 0.055cm^3 volume. The wound is limited to skin breakdown. There is no tunneling noted, however, there is undermining starting at 12:00  and ending at 12:00 with a maximum distance of 0.2cm. There is a large amount of serous drainage noted. The wound margin is distinct with the outline attached to the wound base. There is no granulation within the wound bed. There is a large (67-100%) amount of necrotic tissue within the wound bed including Adherent Slough. The periwound skin appearance exhibited: Callus, Maceration, Moist. The periwound skin appearance did not exhibit: Crepitus, Excoriation, Fluctuance, Friable, Induration, Localized Edema, Rash, Scarring, Dry/Scaly, Atrophie Blanche, Cyanosis, Ecchymosis, Hemosiderin Staining, Mottled, Pallor, Rubor, Erythema. Periwound temperature was noted as No Abnormality. Assessment Active Problems ICD-10 L97.522 - Non-pressure chronic ulcer of other part of left foot with fat layer exposed E11.621 - Type 2 diabetes mellitus with foot ulcer E11.51 - Type 2 diabetes mellitus with diabetic peripheral angiopathy without gangrene Carda, Javell D. ([id number]) Procedures Wound #2 Wound #2 is a Diabetic Wound/Ulcer of the Lower Extremity located on the Left,Plantar Foot . There was a Skin/Subcutaneous Tissue Debridement ([id number]) debridement with total area of 0.35 sq cm performed by [name], MD. with the following instrument(s): Curette to remove Viable and Non-Viable tissue/material including  Exudate, Fibrin/Slough, and Subcutaneous after achieving pain control using Lidocaine 4% Topical Solution. A time out was conducted prior to the start of the procedure. A Moderate amount of bleeding was controlled with Silver Nitrate. The procedure was tolerated well with a pain level of 0 throughout and a pain level of 0 following the procedure. Post Debridement Measurements: 1cm length x 0.8cm width x 0.3cm depth; 0.188cm^3 volume. Post procedure Diagnosis Wound #2: Same as Pre-Procedure Plan Wound Cleansing: Wound #2 Left,Plantar Foot: Clean wound with Normal Saline. Anesthetic: Wound #2 Left,Plantar Foot: Topical Lidocaine 4% cream applied to wound bed prior to debridement Primary Wound Dressing: Wound #2 Left,Plantar Foot: Aquacel Ag - or silver alginate only Secondary Dressing: Wound #2 Left,Plantar Foot: ABD pad Conform/Kerlix Dressing Change Frequency: Wound #2 Left,Plantar Foot: Change dressing every other day. Follow-up Appointments: Wound #2 Left,Plantar Foot: Return Appointment in 1 week. Off-Loading: Wound #2 Left,Plantar Foot: Open toe surgical shoe with peg assist. Additional Orders / Instructions: Wound #2 Left,Plantar Foot: Thomann, Yani D. ([id number]) Increase protein intake. Activity as tolerated Medications-please add to medication list.: Wound #2 Left,Plantar Foot: P.O. Antibiotics - Septra DS po BID x 2 weeks Other: - Vitamin C, Zinc, Multivitamin #1 we are going to continue the Aquacel Ag. Discontinue the foam simply use ABD kerlix. #2 MRI today Electronic Signature(s) Signed: 04/05/2016 4:27:33 PM By: Baltazar Najjar MD Entered By: Baltazar Najjar on 04/05/2016 09:16:06 Vital, [name] ([id number]) -------------------------------------------------------------------------------- SuperBill Details Patient Name: [name] Date of Service: 04/05/2016 Medical Record Patient Account Number: [id number] Number: Treating RN: Phillis Haggis 1955/12/01 (59 y.o. Other Clinician: Date of Birth/Sex: Male) Treating ROBSON, Bannon Primary Care Physician: Joen Laura Physician/Extender: G Referring Physician: Tedra Sen[place] in Treatment: 14 Diagnosis Coding ICD-10 Codes Code Description [phone] Non-pressure chronic ulcer of other part of left foot with fat layer exposed E11.621 Type 2 diabetes mellitus with foot ulcer E11.51 Type 2 diabetes mellitus with diabetic peripheral angiopathy without gangrene Facility Procedures CPT4 Code: [id number] Description: 11042 - DEB SUBQ TISSUE 20 SQ CM/< ICD-10 Description Diagnosis E11.621 Type 2 diabetes mellitus with foot ulcer Modifier: Quantity: 1 Physician Procedures CPT4 Code: [id number] Description: 11042 - WC PHYS SUBQ TISS 20 SQ CM ICD-10 Description Diagnosis E11.621 Type 2 diabetes mellitus with foot ulcer Modifier: Quantity: 1 Electronic Signature(s) Signed: 04/05/2016 4:27:33 PM By: Baltazar Najjar MD Entered  By: Baltazar Najjar on 04/05/2016 09:17:37

## 2016-04-06 NOTE — Progress Notes (Signed)
[name], [name] ([id number]) Visit Report for 04/05/2016 Arrival Information Details Patient Name: [name], [name] Date of Service: 04/05/2016 8:45 AM Medical Record Number: [id number] Patient Account Number: [id number] Date of Birth/Sex: 18-Dec-1955 [age] y.o. Male) Treating RN: [name], Debi Primary Care Physician: [name], LAURA Other Clinician: Referring Physician: Joen Frank, LAURA Treating Physician/Extender: [name], [name]: 14 Visit Information History Since Last Visit All ordered tests and consults were completed: No Patient Arrived: Wheel Chair Added or deleted any medications: No Arrival Time: 08:42 Any new allergies or adverse reactions: No Accompanied By: self Had a fall or experienced change in No activities of daily living that may affect Transfer Assistance: None risk of falls: Patient Identification Verified: Yes Signs or symptoms of abuse/neglect since last No Secondary Verification Process Yes visito Completed: Hospitalized since last visit: No Patient Requires Transmission-Based No Pain Present Now: No Precautions: Patient Has Alerts: No Electronic Signature(s) Signed: 04/05/2016 5:05:09 PM By: [name], Debra Entered By: [name], Debra on 04/05/2016 08:43:05 [name], [name] ([id number]) -------------------------------------------------------------------------------- Encounter Discharge Information Details Patient Name: [name], [name] Date of Service: 04/05/2016 8:45 AM Medical Record Number: [id number] Patient Account Number: [id number] Date of Birth/Sex: 18-Dec-1955 [age] y.o. Male) Treating RN: [name], Debi Primary Care Physician: [name], LAURA Other Clinician: Referring Physician: Joen Frank, LAURA Treating Physician/Extender: [name], [name]: 14 Encounter Discharge Information Items Discharge Pain Level: 0 Discharge Condition: Stable Ambulatory Status: Wheelchair Discharge Destination: Nursing Home Transportation:  Other Accompanied By: self Schedule Follow-up Appointment: Yes Medication Reconciliation completed and provided to Patient/Care Yes Khyler Frank: Provided on Clinical Summary of Care: 04/05/2016 Form Type Recipient Paper Patient [name] Electronic Signature(s) Signed: 04/05/2016 9:19:34 AM By: [name], Shelia Entered By: [name], Shelia on 04/05/2016 09:19:34 [name], [name] ([id number]) -------------------------------------------------------------------------------- Lower Extremity Assessment Details Patient Name: Showers, [name] Date of Service: 04/05/2016 8:45 AM Medical Record Number: [id number] Patient Account Number: [id number] Date of Birth/Sex: 18-Dec-1955 107(59 y.o. Male) Treating RN: [name], Debi Primary Care Physician: [name], LAURA Other Clinician: Referring Physician: Joen Frank, LAURA Treating Physician/Extender: [name], [name]: 14 Vascular Assessment Pulses: Posterior Tibial Dorsalis Pedis Palpable: [Right:Yes] Extremity colors, hair growth, and conditions: Extremity Color: [Right:Mottled] Temperature of Extremity: [Right:Warm] Capillary Refill: [Right:< 3 seconds] Electronic Signature(s) Signed: 04/05/2016 5:05:09 PM By: [name], Debra Entered By: [name], Debra on 04/05/2016 08:45:10 [name], [name] ([id number]) -------------------------------------------------------------------------------- Multi Wound Chart Details Patient Name: [name], [name] Date of Service: 04/05/2016 8:45 AM Medical Record Number: [id number] Patient Account Number: [id number] Date of Birth/Sex: 18-Dec-1955 [age] y.o. Male) Treating RN: [name], Debi Primary Care Physician: [name], LAURA Other Clinician: Referring Physician: Joen Frank, LAURA Treating Physician/Extender: [name], [name]: 14 Vital Signs Height(in): 67 Pulse(bpm): 66 Weight(lbs): 195 Blood Pressure 157/75 (mmHg): Body Mass Index(BMI): 31 Temperature(F): 98.2 Respiratory  Rate 18 (breaths/min): Photos: [2:No Photos] [N/A:N/A] Wound Location: [2:Left Foot - Plantar] [N/A:N/A] Wounding Event: [2:Gradually Appeared] [N/A:N/A] Primary Etiology: [2:Diabetic Wound/Ulcer of the Lower Extremity] [N/A:N/A] Comorbid History: [2:Anemia, Chronic Obstructive Pulmonary Disease (COPD), Arrhythmia, Congestive Heart Failure, Coronary Artery Disease, Hypertension, Type II Diabetes, Neuropathy] [N/A:N/A] Date Acquired: [2:12/21/2014] [N/A:N/A] Weeks of Frank: [2:14] [N/A:N/A] Wound Status: [2:Open] [N/A:N/A] Measurements L x W x D 0.7x0.5x0.2 [N/A:N/A] (cm) Area (cm) : [2:0.275] [N/A:N/A] Volume (cm) : [2:0.055] [N/A:N/A] % Reduction in Area: [2:65.00%] [N/A:N/A] % Reduction in Volume: 76.70% [N/A:N/A] Starting Position 1 12 (o'clock): Ending Position 1 [2:12] (o'clock): Maximum Distance 1 0.2 (cm): Undermining: [2:Yes] [N/A:N/A] Classification: [2:Grade 1] [N/A:N/A] Exudate Amount: Large N/A N/A Exudate Type: Serous  N/A N/A Exudate Color: amber N/A N/A Wound Margin: Distinct, outline attached N/A N/A Granulation Amount: None Present (0%) N/A N/A Necrotic Amount: Large (67-100%) N/A N/A Exposed Structures: Fascia: No N/A N/A Fat: No Tendon: No Muscle: No Joint: No Bone: No Limited to Skin Breakdown Epithelialization: Medium (34-66%) N/A N/A Debridement: Debridement (21308- N/A N/A 11047) Time-Out Taken: Yes N/A N/A Pain Control: Lidocaine 4% Topical N/A N/A Solution Tissue Debrided: Fibrin/Slough, Exudates, N/A N/A Subcutaneous Level: Skin/Subcutaneous N/A N/A Tissue Debridement Area (sq 0.35 N/A N/A cm): Instrument: Curette N/A N/A Bleeding: Moderate N/A N/A Hemostasis Achieved: Silver Nitrate N/A N/A Procedural Pain: 0 N/A N/A Post Procedural Pain: 0 N/A N/A Debridement Frank Procedure was tolerated N/A N/A Response: well Post Debridement 1x0.8x0.3 N/A N/A Measurements L x W x D (cm) Post Debridement 0.188 N/A N/A Volume:  (cm) Periwound Skin Texture: Callus: Yes N/A N/A Edema: No Excoriation: No Induration: No Crepitus: No Fluctuance: No Friable: No Rash: No Scarring: No Periwound Skin Maceration: Yes N/A N/A Moisture: Moist: Yes Dry/Scaly: No [name], [name] ([id number]) Periwound Skin Color: Atrophie Blanche: No N/A N/A Cyanosis: No Ecchymosis: No Erythema: No Hemosiderin Staining: No Mottled: No Pallor: No Rubor: No Temperature: No Abnormality N/A N/A Tenderness on No N/A N/A Palpation: Wound Preparation: Ulcer Cleansing: N/A N/A Rinsed/Irrigated with Saline Topical Anesthetic Applied: Other: lidocaine 4% Procedures Performed: Debridement N/A N/A Frank Notes Wound #2 (Left, Plantar Foot) 1. Cleansed with: Clean wound with Normal Saline 2. Anesthetic Topical Lidocaine 4% cream to wound bed prior to debridement 4. Dressing Applied: Aquacel Ag 5. Secondary Dressing Applied ABD Pad Dry Gauze Kerlix/Conform 7. Secured with Tape Notes netting and Optician, dispensing) Signed: 04/05/2016 5:05:09 PM By: Tyler Mulling Entered By: Tyler Mulling on 04/05/2016 09:14:53 Flaharty, Laurie D. ([id number]) -------------------------------------------------------------------------------- Multi-Disciplinary Care Plan Details Patient Name: Tyler Milo D. Date of Service: 04/05/2016 8:45 AM Medical Record Number: [id number] Patient Account Number: [id number] Date of Birth/Sex: [birth year] (60 y.o. Male) Treating RN: Tyler Haggis Primary Care Physician: Tyler Laura Other Clinician: Referring Physician: Joen Laura Treating Physician/Extender: Tyler [place] in Frank: 14 Active Inactive Abuse / Safety / Falls / Self Care Management Nursing Diagnoses: Impaired home maintenance Impaired physical mobility Knowledge deficit related to: safety; personal, health (wound), emergency Potential for falls Self care deficit: actual or potential Goals: Patient will  remain injury free Date Initiated: 12/28/2015 Goal Status: Active Patient/caregiver will verbalize understanding of skin care regimen Date Initiated: 12/28/2015 Goal Status: Active Patient/caregiver will verbalize/demonstrate measure taken to improve self care Date Initiated: 12/28/2015 Goal Status: Active Patient/caregiver will verbalize/demonstrate measures taken to improve the patient's personal safety Date Initiated: 12/28/2015 Goal Status: Active Patient/caregiver will verbalize/demonstrate measures taken to prevent injury and/or falls Date Initiated: 12/28/2015 Goal Status: Active Patient/caregiver will verbalize/demonstrate understanding of what to do in case of emergency Date Initiated: 12/28/2015 Goal Status: Active Interventions: Assess fall risk on admission and as needed Assess: immobility, friction, shearing, incontinence upon admission and as needed Assess impairment of mobility on admission and as needed per policy Assess self care needs on admission and as needed Provide education on basic hygiene [name], Tyler POCOCK. ([id number]) Provide education on fall prevention Provide education on personal and home safety Provide education on safe transfers Frank Activities: Education provided on Basic Hygiene : 03/14/2016 Notes: Orientation to the Wound Care Program Nursing Diagnoses: Knowledge deficit related to the wound healing center program Goals: Patient/caregiver will verbalize understanding of the Wound Healing Center Program Date Initiated: 12/28/2015 Goal Status: Active Interventions:  Provide education on orientation to the wound center Notes: Venous Leg Ulcer Nursing Diagnoses: Knowledge deficit related to disease process and management Potential for venous Insuffiency (use before diagnosis confirmed) Goals: Non-invasive venous studies are completed as ordered Date Initiated: 12/28/2015 Goal Status: Active Patient/caregiver will verbalize understanding of  disease process and disease management Date Initiated: 12/28/2015 Goal Status: Active Verify adequate tissue perfusion prior to therapeutic compression application Date Initiated: 12/28/2015 Goal Status: Active Interventions: Assess peripheral edema status every visit. Provide education on venous insufficiency Notes: [name], [name] ([id number]) Wound/Skin Impairment Nursing Diagnoses: Impaired tissue integrity Knowledge deficit related to smoking impact on wound healing Knowledge deficit related to ulceration/compromised skin integrity Goals: Patient/caregiver will verbalize understanding of skin care regimen Date Initiated: 12/28/2015 Goal Status: Active Ulcer/skin breakdown will have a volume reduction of 30% by week 4 Date Initiated: 12/28/2015 Goal Status: Active Ulcer/skin breakdown will have a volume reduction of 50% by week 8 Date Initiated: 12/28/2015 Goal Status: Active Ulcer/skin breakdown will have a volume reduction of 80% by week 12 Date Initiated: 12/28/2015 Goal Status: Active Ulcer/skin breakdown will heal within 14 weeks Date Initiated: 12/28/2015 Goal Status: Active Interventions: Assess patient/caregiver ability to obtain necessary supplies Assess patient/caregiver ability to perform ulcer/skin care regimen upon admission and as needed Assess ulceration(s) every visit Provide education on ulcer and skin care Notes: Electronic Signature(s) Signed: 04/05/2016 5:05:09 PM By: Tyler Mulling Entered By: Tyler Mulling on 04/05/2016 09:14:46 [name], [name] ([id number]) -------------------------------------------------------------------------------- Pain Assessment Details Patient Name: Tyler Milo D. Date of Service: 04/05/2016 8:45 AM Medical Record Number: [id number] Patient Account Number: [id number] Date of Birth/Sex: 01/28/1956 (60 y.o. Male) Treating RN: Tyler Haggis Primary Care Physician: Tyler Laura Other Clinician: Referring Physician: Joen Laura Treating Physician/Extender: Tyler Caul Weeks in Frank: 14 Active Problems Location of Pain Severity and Description of Pain Patient Has Paino No Site Locations Pain Management and Medication Current Pain Management: Electronic Signature(s) Signed: 04/05/2016 5:05:09 PM By: Tyler Mulling Entered By: Tyler Mulling on 04/05/2016 08:43:10 [name], [name] ([id number]) -------------------------------------------------------------------------------- Patient/Caregiver Education Details Patient Name: Tyler Milo D. Date of Service: 04/05/2016 8:45 AM Medical Record Number: [id number] Patient Account Number: [id number] Date of Birth/Gender: [birth year] (60 y.o. Male) Treating RN: Tyler Haggis Primary Care Physician: Tyler Laura Other Clinician: Referring Physician: Joen Laura Treating Physician/Extender: Tyler [place] in Frank: 14 Education Assessment Education Provided To: Patient Education Topics Provided Wound/Skin Impairment: Handouts: Other: change dressing as ordered Methods: Demonstration, Explain/Verbal Responses: State content correctly Electronic Signature(s) Signed: 04/05/2016 5:05:09 PM By: Tyler Mulling Entered By: Tyler Mulling on 04/05/2016 09:14:18 [name], [name] ([id number]) -------------------------------------------------------------------------------- Wound Assessment Details Patient Name: [name], [name] Date of Service: 04/05/2016 8:45 AM Medical Record Number: [id number] Patient Account Number: [id number] Date of Birth/Sex: 1956/07/27 (60 y.o. Male) Treating RN: Tyler Haggis Primary Care Physician: Tyler Laura Other Clinician: Referring Physician: Joen Laura Treating Physician/Extender: Tyler Caul Weeks in Frank: 14 Wound Status Wound Number: 2 Primary Diabetic Wound/Ulcer of the Lower Etiology: Extremity Wound Location: Left Foot - Plantar Wound Open Wounding Event: Gradually  Appeared Status: Date Acquired: 12/21/2014 Comorbid Anemia, Chronic Obstructive Pulmonary Weeks Of Frank: 14 History: Disease (COPD), Arrhythmia, Clustered Wound: No Congestive Heart Failure, Coronary Artery Disease, Hypertension, Type II Diabetes, Neuropathy Photos Photo Uploaded By: Tyler Mulling on 04/05/2016 16:20:07 Wound Measurements Length: (cm) 0.7 Width: (cm) 0.5 Depth: (cm) 0.2 Area: (cm) 0.275 Volume: (cm) 0.055 % Reduction in Area: 65% % Reduction in Volume: 76.7% Epithelialization: Medium (34-66%) Tunneling: No Undermining:  Yes Starting Position (o'clock): 12 Ending Position (o'clock): 12 Maximum Distance: (cm) 0.2 Wound Description Classification: Grade 1 Wound Margin: Distinct, outline attached Exudate Amount: Large Exudate Type: Serous Exudate Color: amber [name], [name] ([id number]) Foul Odor After Cleansing: No Wound Bed Granulation Amount: None Present (0%) Exposed Structure Necrotic Amount: Large (67-100%) Fascia Exposed: No Necrotic Quality: Adherent Slough Fat Layer Exposed: No Tendon Exposed: No Muscle Exposed: No Joint Exposed: No Bone Exposed: No Limited to Skin Breakdown Periwound Skin Texture Texture Color No Abnormalities Noted: No No Abnormalities Noted: No Callus: Yes Atrophie Blanche: No Crepitus: No Cyanosis: No Excoriation: No Ecchymosis: No Fluctuance: No Erythema: No Friable: No Hemosiderin Staining: No Induration: No Mottled: No Localized Edema: No Pallor: No Rash: No Rubor: No Scarring: No Temperature / Pain Moisture Temperature: No Abnormality No Abnormalities Noted: No Dry / Scaly: No Maceration: Yes Moist: Yes Wound Preparation Ulcer Cleansing: Rinsed/Irrigated with Saline Topical Anesthetic Applied: Other: lidocaine 4%, Frank Notes Wound #2 (Left, Plantar Foot) 1. Cleansed with: Clean wound with Normal Saline 2. Anesthetic Topical Lidocaine 4% cream to wound bed prior to  debridement 4. Dressing Applied: Aquacel Ag 5. Secondary Dressing Applied ABD Pad Dry Gauze Kerlix/Conform 7. Secured with Tape [name], Buell D. ([id number]) Notes netting and Darco Electronic Signature(s) Signed: 04/05/2016 5:05:09 PM By: Tyler Mulling Entered By: Tyler Mulling on 04/05/2016 08:49:12 Kindred, Rashod D. ([id number]) -------------------------------------------------------------------------------- Vitals Details Patient Name: Surles, Lamarco D. Date of Service: 04/05/2016 8:45 AM Medical Record Number: [id number] Patient Account Number: [id number] Date of Birth/Sex: 1956-08-09 (60 y.o. Male) Treating RN: Tyler Cordia, Debi Primary Care Physician: Tyler Laura Other Clinician: Referring Physician: Joen Laura Treating Physician/Extender: Tyler Caul Weeks in Frank: 14 Vital Signs Time Taken: 08:43 Temperature (F): 98.2 Height (in): 67 Pulse (bpm): 66 Weight (lbs): 195 Respiratory Rate (breaths/min): 18 Body Mass Index (BMI): 30.5 Blood Pressure (mmHg): 157/75 Reference Range: 80 - 120 mg / dl Electronic Signature(s) Signed: 04/05/2016 5:05:09 PM By: Tyler Mulling Entered By: Tyler Mulling on 04/05/2016 08:43:28

## 2016-04-12 ENCOUNTER — Encounter: Payer: Medicare Other | Admitting: Internal Medicine

## 2016-04-12 DIAGNOSIS — E11621 Type 2 diabetes mellitus with foot ulcer: Secondary | ICD-10-CM | POA: Diagnosis not present

## 2016-04-13 NOTE — Progress Notes (Signed)
[name], Marshel D. ([id number]) Visit Report for 04/12/2016 Arrival Information Details Patient Name: [name], [name] Date of Service: 04/12/2016 8:45 AM Medical Record Number: [id number] Patient Account Number: [id number] Date of Birth/Sex: 1956-01-02 67(59 y.o. Male) Treating RN: Phillis HaggisPinkerton, Debi Primary Care Physician: SYSTEM, PCP Other Clinician: Referring Physician: Joen LauraBLISS, Frank Treating Physician/Extender: Altamese CarolinaOBSON, Zechariah Frank Weeks in Treatment: 15 Visit Information History Since Last Visit All ordered tests and consults were completed: No Patient Arrived: Wheel Chair Added or deleted any medications: No Arrival Time: 08:33 Any new allergies or adverse reactions: No Accompanied By: self Had a fall or experienced change in No activities of daily living that may affect Transfer Assistance: None risk of falls: Patient Identification Verified: Yes Signs or symptoms of abuse/neglect since last No Secondary Verification Process Yes visito Completed: Hospitalized since last visit: No Patient Requires Transmission-Based No Pain Present Now: No Precautions: Patient Has Alerts: No Electronic Signature(s) Signed: 04/12/2016 5:48:23 PM By: Alejandro MullingPinkerton, Debra Entered By: Alejandro MullingPinkerton, Debra on 04/12/2016 08:34:04 [name], [name] ([id number]) -------------------------------------------------------------------------------- Encounter Discharge Information Details Patient Name: [name], [name] Date of Service: 04/12/2016 8:45 AM Medical Record Number: [id number] Patient Account Number: [id number] Date of Birth/Sex: 1956-01-02 19(59 y.o. Male) Treating RN: Phillis HaggisPinkerton, Debi Primary Care Physician: SYSTEM, PCP Other Clinician: Referring Physician: Joen LauraBLISS, Frank Treating Physician/Extender: Altamese CarolinaOBSON, Dimitrios Frank Weeks in Treatment: 15 Encounter Discharge Information Items Discharge Pain Level: 0 Discharge Condition: Stable Ambulatory Status: Wheelchair Discharge Destination: Nursing Home Transportation:  Other Accompanied By: self Schedule Follow-up Appointment: Yes Medication Reconciliation completed and provided to Patient/Care Yes Meryl Hubers: Provided on Clinical Summary of Care: 04/12/2016 Form Type Recipient Paper [facility] Electronic Signature(s) Signed: 04/12/2016 9:23:23 AM By: Gwenlyn PerkingMoore, Shelia Entered By: Gwenlyn PerkingMoore, Shelia on 04/12/2016 09:23:23 [name], [name] ([id number]) -------------------------------------------------------------------------------- Lower Extremity Assessment Details Patient Name: [name], [name] Date of Service: 04/12/2016 8:45 AM Medical Record Number: [id number] Patient Account Number: [id number] Date of Birth/Sex: 1956-01-02 59(59 y.o. Male) Treating RN: Phillis HaggisPinkerton, Debi Primary Care Physician: SYSTEM, PCP Other Clinician: Referring Physician: Joen LauraBLISS, Frank Treating Physician/Extender: Maxwell CaulOBSON, Iley Frank Weeks in Treatment: 15 Vascular Assessment Pulses: Posterior Tibial Dorsalis Pedis Palpable: [Right:Yes] Extremity colors, hair growth, and conditions: Extremity Color: [Right:Mottled] Temperature of Extremity: [Right:Warm] Capillary Refill: [Right:< 3 seconds] Electronic Signature(s) Signed: 04/12/2016 5:48:23 PM By: Alejandro MullingPinkerton, Debra Entered By: Alejandro MullingPinkerton, Debra on 04/12/2016 08:36:53 [name], [name] ([id number]) -------------------------------------------------------------------------------- Multi Wound Chart Details Patient Name: [name], [name] Date of Service: 04/12/2016 8:45 AM Medical Record Number: [id number] Patient Account Number: [id number] Date of Birth/Sex: 1956-01-02 33(59 y.o. Male) Treating RN: Phillis HaggisPinkerton, Debi Primary Care Physician: SYSTEM, PCP Other Clinician: Referring Physician: Joen LauraBLISS, Frank Treating Physician/Extender: Maxwell CaulOBSON, Jayten Frank Weeks in Treatment: 15 Vital Signs Height(in): 67 Pulse(bpm): 61 Weight(lbs): 195 Blood Pressure 153/69 (mmHg): Body Mass Index(BMI): 31 Temperature(F): 98.3 Respiratory  Rate 18 (breaths/min): Photos: [2:No Photos] [N/A:N/A] Wound Location: [2:Left Foot - Plantar] [N/A:N/A] Wounding Event: [2:Gradually Appeared] [N/A:N/A] Primary Etiology: [2:Diabetic Wound/Ulcer of the Lower Extremity] [N/A:N/A] Comorbid History: [2:Anemia, Chronic Obstructive Pulmonary Disease (COPD), Arrhythmia, Congestive Heart Failure, Coronary Artery Disease, Hypertension, Type II Diabetes, Neuropathy] [N/A:N/A] Date Acquired: [2:12/21/2014] [N/A:N/A] Weeks of Treatment: [2:15] [N/A:N/A] Wound Status: [2:Open] [N/A:N/A] Measurements L x W x D 0.5x0.4x0.2 [N/A:N/A] (cm) Area (cm) : [2:0.157] [N/A:N/A] Volume (cm) : [2:0.031] [N/A:N/A] % Reduction in Area: [2:80.00%] [N/A:N/A] % Reduction in Volume: 86.90% [N/A:N/A] Classification: [2:Grade 1] [N/A:N/A] Exudate Amount: [2:Large] [N/A:N/A] Exudate Type: [2:Serous] [N/A:N/A] Exudate Color: [2:amber] [N/A:N/A] Wound Margin: [2:Distinct, outline attached] [N/A:N/A] Granulation Amount: [2:Medium (34-66%)] [N/A:N/A] Granulation Quality: [2:Pink] [  N/A:N/A] Necrotic Amount: [2:Medium (34-66%)] [N/A:N/A] Exposed Structures: Fascia: No N/A N/A Fat: No Tendon: No Muscle: No Joint: No Bone: No Limited to Skin Breakdown Epithelialization: Medium (34-66%) N/A N/A Periwound Skin Texture: Callus: Yes N/A N/A Edema: No Excoriation: No Induration: No Crepitus: No Fluctuance: No Friable: No Rash: No Scarring: No Periwound Skin Moist: Yes N/A N/A Moisture: Maceration: No Dry/Scaly: No Periwound Skin Color: Atrophie Blanche: No N/A N/A Cyanosis: No Ecchymosis: No Erythema: No Hemosiderin Staining: No Mottled: No Pallor: No Rubor: No Temperature: No Abnormality N/A N/A Tenderness on No N/A N/A Palpation: Wound Preparation: Ulcer Cleansing: N/A N/A Rinsed/Irrigated with Saline Topical Anesthetic Applied: Other: lidocaine 4% Treatment Notes Electronic Signature(s) Signed: 04/12/2016 5:48:23 PM By: Alejandro Mulling Entered By: Alejandro Mulling on 04/12/2016 08:43:56 [name], [name] ([id number]) -------------------------------------------------------------------------------- Multi-Disciplinary Care Plan Details Patient Name: Tyler Milo D. Date of Service: 04/12/2016 8:45 AM Medical Record Number: [id number] Patient Account Number: [id number] Date of Birth/Sex: 11/19/1955 (60 y.o. Male) Treating RN: Phillis Haggis Primary Care Physician: SYSTEM, PCP Other Clinician: Referring Physician: Joen Frank Treating Physician/Extender: Altamese Girard in Treatment: 15 Active Inactive Abuse / Safety / Falls / Self Care Management Nursing Diagnoses: Impaired home maintenance Impaired physical mobility Knowledge deficit related to: safety; personal, health (wound), emergency Potential for falls Self care deficit: actual or potential Goals: Patient will remain injury free Date Initiated: 12/28/2015 Goal Status: Active Patient/caregiver will verbalize understanding of skin care regimen Date Initiated: 12/28/2015 Goal Status: Active Patient/caregiver will verbalize/demonstrate measure taken to improve self care Date Initiated: 12/28/2015 Goal Status: Active Patient/caregiver will verbalize/demonstrate measures taken to improve the patient's personal safety Date Initiated: 12/28/2015 Goal Status: Active Patient/caregiver will verbalize/demonstrate measures taken to prevent injury and/or falls Date Initiated: 12/28/2015 Goal Status: Active Patient/caregiver will verbalize/demonstrate understanding of what to do in case of emergency Date Initiated: 12/28/2015 Goal Status: Active Interventions: Assess fall risk on admission and as needed Assess: immobility, friction, shearing, incontinence upon admission and as needed Assess impairment of mobility on admission and as needed per policy Assess self care needs on admission and as needed Provide education on basic hygiene [name], [name]  ([id number]) Provide education on fall prevention Provide education on personal and home safety Provide education on safe transfers Treatment Activities: Education provided on Basic Hygiene : 03/07/2016 Notes: Orientation to the Wound Care Program Nursing Diagnoses: Knowledge deficit related to the wound healing center program Goals: Patient/caregiver will verbalize understanding of the Wound Healing Center Program Date Initiated: 12/28/2015 Goal Status: Active Interventions: Provide education on orientation to the wound center Notes: Venous Leg Ulcer Nursing Diagnoses: Knowledge deficit related to disease process and management Potential for venous Insuffiency (use before diagnosis confirmed) Goals: Non-invasive venous studies are completed as ordered Date Initiated: 12/28/2015 Goal Status: Active Patient/caregiver will verbalize understanding of disease process and disease management Date Initiated: 12/28/2015 Goal Status: Active Verify adequate tissue perfusion prior to therapeutic compression application Date Initiated: 12/28/2015 Goal Status: Active Interventions: Assess peripheral edema status every visit. Provide education on venous insufficiency Notes: [name], [name] ([id number]) Wound/Skin Impairment Nursing Diagnoses: Impaired tissue integrity Knowledge deficit related to smoking impact on wound healing Knowledge deficit related to ulceration/compromised skin integrity Goals: Patient/caregiver will verbalize understanding of skin care regimen Date Initiated: 12/28/2015 Goal Status: Active Ulcer/skin breakdown will have a volume reduction of 30% by week 4 Date Initiated: 12/28/2015 Goal Status: Active Ulcer/skin breakdown will have a volume reduction of 50% by week 8 Date Initiated: 12/28/2015 Goal Status: Active Ulcer/skin breakdown  will have a volume reduction of 80% by week 12 Date Initiated: 12/28/2015 Goal Status: Active Ulcer/skin breakdown will heal within  14 weeks Date Initiated: 12/28/2015 Goal Status: Active Interventions: Assess patient/caregiver ability to obtain necessary supplies Assess patient/caregiver ability to perform ulcer/skin care regimen upon admission and as needed Assess ulceration(s) every visit Provide education on ulcer and skin care Notes: Electronic Signature(s) Signed: 04/12/2016 5:48:23 PM By: Alejandro Mulling Entered By: Alejandro Mulling on 04/12/2016 08:43:50 [name], [name] ([id number]) -------------------------------------------------------------------------------- Pain Assessment Details Patient Name: Tyler Milo D. Date of Service: 04/12/2016 8:45 AM Medical Record Number: [id number] Patient Account Number: [id number] Date of Birth/Sex: Sep 25, 1956 (60 y.o. Male) Treating RN: Phillis Haggis Primary Care Physician: SYSTEM, PCP Other Clinician: Referring Physician: Joen Frank Treating Physician/Extender: [name] Weeks in Treatment: 15 Active Problems Location of Pain Severity and Description of Pain Patient Has Paino No Site Locations Pain Management and Medication Current Pain Management: Electronic Signature(s) Signed: 04/12/2016 5:48:23 PM By: Alejandro Mulling Entered By: Alejandro Mulling on 04/12/2016 08:34:13 [name], [name] ([id number]) -------------------------------------------------------------------------------- Patient/Caregiver Education Details Patient Name: Tyler Milo D. Date of Service: 04/12/2016 8:45 AM Medical Record Number: [id number] Patient Account Number: [id number] Date of Birth/Gender: 1956/10/17 (60 y.o. Male) Treating RN: Phillis Haggis Primary Care Physician: SYSTEM, PCP Other Clinician: Referring Physician: Joen Frank Treating Physician/Extender: Altamese [place] in Treatment: 15 Education Assessment Education Provided To: Patient Education Topics Provided Wound/Skin Impairment: Handouts: Other: change dressing as ordered Methods: Demonstration,  Explain/Verbal Responses: State content correctly Electronic Signature(s) Signed: 04/12/2016 5:48:23 PM By: Alejandro Mulling Entered By: Alejandro Mulling on 04/12/2016 08:45:18 [name], [name] ([id number]) -------------------------------------------------------------------------------- Wound Assessment Details Patient Name: [name], [name] Date of Service: 04/12/2016 8:45 AM Medical Record Number: [id number] Patient Account Number: [id number] Date of Birth/Sex: 10-27-1956 (60 y.o. Male) Treating RN: Phillis Haggis Primary Care Physician: SYSTEM, PCP Other Clinician: Referring Physician: Joen Frank Treating Physician/Extender: [name] Weeks in Treatment: 15 Wound Status Wound Number: 2 Primary Diabetic Wound/Ulcer of the Lower Etiology: Extremity Wound Location: Left Foot - Plantar Wound Open Wounding Event: Gradually Appeared Status: Date Acquired: 12/21/2014 Comorbid Anemia, Chronic Obstructive Pulmonary Weeks Of Treatment: 15 History: Disease (COPD), Arrhythmia, Clustered Wound: No Congestive Heart Failure, Coronary Artery Disease, Hypertension, Type II Diabetes, Neuropathy Photos Photo Uploaded By: Alejandro Mulling on 04/12/2016 17:46:39 Wound Measurements Length: (cm) 0.5 Width: (cm) 0.4 Depth: (cm) 0.2 Area: (cm) 0.157 Volume: (cm) 0.031 % Reduction in Area: 80% % Reduction in Volume: 86.9% Epithelialization: Medium (34-66%) Tunneling: No Undermining: No Wound Description Classification: Grade 1 Wound Margin: Distinct, outline attached Exudate Amount: Large Exudate Type: Serous Exudate Color: amber Foul Odor After Cleansing: No Wound Bed Granulation Amount: Medium (34-66%) Exposed Structure Granulation Quality: Pink Fascia Exposed: No [name], [name] ([id number]) Necrotic Amount: Medium (34-66%) Fat Layer Exposed: No Necrotic Quality: Adherent Slough Tendon Exposed: No Muscle Exposed: No Joint Exposed: No Bone Exposed: No Limited to Skin  Breakdown Periwound Skin Texture Texture Color No Abnormalities Noted: No No Abnormalities Noted: No Callus: Yes Atrophie Blanche: No Crepitus: No Cyanosis: No Excoriation: No Ecchymosis: No Fluctuance: No Erythema: No Friable: No Hemosiderin Staining: No Induration: No Mottled: No Localized Edema: No Pallor: No Rash: No Rubor: No Scarring: No Temperature / Pain Moisture Temperature: No Abnormality No Abnormalities Noted: No Dry / Scaly: No Maceration: No Moist: Yes Wound Preparation Ulcer Cleansing: Rinsed/Irrigated with Saline Topical Anesthetic Applied: Other: lidocaine 4%, Treatment Notes Wound #2 (Left, Plantar Foot) 1. Cleansed with: Clean wound with Normal Saline  2. Anesthetic Topical Lidocaine 4% cream to wound bed prior to debridement 4. Dressing Applied: Aquacel Ag 5. Secondary Dressing Applied ABD Pad Dry Gauze Kerlix/Conform 7. Secured with Tape Notes netting and Darco [name], Pavan D. ([id number]) Electronic Signature(s) Signed: 04/12/2016 5:48:23 PM By: Alejandro Mulling Entered By: Alejandro Mulling on 04/12/2016 08:43:10 Robey, Hy D. ([id number]) -------------------------------------------------------------------------------- Vitals Details Patient Name: Tyler Milo D. Date of Service: 04/12/2016 8:45 AM Medical Record Number: [id number] Patient Account Number: [id number] Date of Birth/Sex: [birth year] (60 y.o. Male) Treating RN: Ashok Cordia, Debi Primary Care Physician: SYSTEM, PCP Other Clinician: Referring Physician: Joen Frank Treating Physician/Extender: [name] Weeks in Treatment: 15 Vital Signs Time Taken: 08:34 Temperature (F): 98.3 Height (in): 67 Pulse (bpm): 61 Weight (lbs): 195 Respiratory Rate (breaths/min): 18 Body Mass Index (BMI): 30.5 Blood Pressure (mmHg): 153/69 Reference Range: 80 - 120 mg / dl Electronic Signature(s) Signed: 04/12/2016 5:48:23 PM By: Alejandro Mulling Entered By: Alejandro Mulling on  04/12/2016 08:36:17

## 2016-04-18 ENCOUNTER — Ambulatory Visit: Payer: Medicare Other

## 2016-04-19 ENCOUNTER — Encounter: Payer: Medicare Other | Attending: Internal Medicine | Admitting: Internal Medicine

## 2016-04-19 DIAGNOSIS — E11621 Type 2 diabetes mellitus with foot ulcer: Secondary | ICD-10-CM | POA: Insufficient documentation

## 2016-04-19 DIAGNOSIS — M86472 Chronic osteomyelitis with draining sinus, left ankle and foot: Secondary | ICD-10-CM | POA: Diagnosis not present

## 2016-04-19 DIAGNOSIS — L97522 Non-pressure chronic ulcer of other part of left foot with fat layer exposed: Secondary | ICD-10-CM | POA: Diagnosis not present

## 2016-04-19 DIAGNOSIS — E1151 Type 2 diabetes mellitus with diabetic peripheral angiopathy without gangrene: Secondary | ICD-10-CM | POA: Diagnosis not present

## 2016-04-19 DIAGNOSIS — Z794 Long term (current) use of insulin: Secondary | ICD-10-CM | POA: Insufficient documentation

## 2016-04-20 ENCOUNTER — Telehealth: Payer: Self-pay | Admitting: Cardiology

## 2016-04-20 NOTE — Progress Notes (Signed)
NYZAIAH, KAI ([id number]) Visit Report for 04/19/2016 Chief Complaint Document Details Patient Name: [name], [name]. Date of Service: 04/19/2016 8:00 AM Medical Record Patient Account Number: [id number] Number: Treating RN: Phillis Haggis [birth year] (60 y.o. Other Clinician: Date of Birth/Sex: Male) Treating Mikaylah Libbey, Alisha Primary Care Physician: SYSTEM, PCP Physician/Extender: G Referring Physician: Tedra Senegal in Treatment: 16 Information Obtained from: Patient Chief Complaint Patient is here for review of a wound on his left first metatarsal head that is been there for the last 6 months Electronic Signature(s) Signed: 04/19/2016 5:53:31 PM By: Baltazar Najjar MD Entered By: Baltazar Najjar on 04/19/2016 08:32:21 [name], [name] ([id number]) -------------------------------------------------------------------------------- Debridement Details Patient Name: Tyler Milo D. Date of Service: 04/19/2016 8:00 AM Medical Record Patient Account Number: [id number] Number: Treating RN: Phillis Haggis 10/02/1956 (60 y.o. Other Clinician: Date of Birth/Sex: Male) Treating Darlyne Schmiesing, Seaver Primary Care Physician: SYSTEM, PCP Physician/Extender: G Referring Physician: Tedra Senegal in Treatment: 16 Debridement Performed for Wound #2 Left,Plantar Foot Assessment: Performed By: Physician [name], MD Debridement: Debridement Pre-procedure Yes Verification/Time Out Taken: Start Time: 08:20 Pain Control: Lidocaine 4% Topical Solution Level: Skin/Subcutaneous Tissue Total Area Debrided (L x 0.4 (cm) x 0.4 (cm) = 0.16 (cm) W): Tissue and other Viable, Non-Viable, Exudate, Fibrin/Slough, Subcutaneous material debrided: Instrument: Curette Bleeding: Minimum Hemostasis Achieved: Pressure End Time: 08:25 Procedural Pain: 0 Post Procedural Pain: 0 Response to Treatment: Procedure was tolerated well Post Debridement Measurements of Total  Wound Length: (cm) 0.4 Width: (cm) 0.4 Depth: (cm) 0.3 Volume: (cm) 0.038 Post Procedure Diagnosis Same as Pre-procedure Electronic Signature(s) Signed: 04/19/2016 5:53:31 PM By: Baltazar Najjar MD Signed: 04/19/2016 5:55:43 PM By: Alejandro Mulling Entered By: Baltazar Najjar on 04/19/2016 08:32:00 [name], [name] ([id number]) -------------------------------------------------------------------------------- HPI Details Patient Name: Tyler Milo D. Date of Service: 04/19/2016 8:00 AM Medical Record Patient Account Number: [id number] Number: Treating RN: Phillis Haggis [birth year] (60 y.o. Other Clinician: Date of Birth/Sex: Male) Treating Jden Want, Garen Primary Care Physician: SYSTEM, PCP Physician/Extender: G Referring Physician: Tedra Senegal in Treatment: 16 History of Present Illness HPI Description: 12/28/15. This patient is a type II diabetic on insulin. He had vascular interventions 2 weeks ago on 1/17. This included an angioplasty of the left superficial femoral and popliteal arteries as well as a percutaneous transluminal angioplasty of the left posterior tibial artery. Nevertheless he is here for a plantar wound that the patient says is been there for the last 6 months. He initially told us that he is applying Neosporin to this however he tells me also that he has been in a nursing home for the last year. I found that to be a somewhat on usual combination of fax. In any case the patient is a diabetic with diabetic PAD. 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 His ABI on the left side today was 0.62 01/11/16; we have been using Prisma to the wound areas on these first metatarsal head on the left and the small wound on his left anterior ankle. He states that the area on his left metatarsal head was not changed all week, this might account for why there is so much maceration here. In the meantime he tells me he has fallen and  hurt his shoulder x-ray is negative. He is also complaining of shortness of breath 01/18/16; the area on his left anterior lower leg/ankle is resolved. The area over his first metatarsal head looks better. There is less maceration  tissue here. Surgical debridement to remove surface slough and nonviable subcutaneous tissue however this generally appears better. 01/25/16; patient arrives today with several areas which are small and superficial on the left leg. This may be from scratching which he admittedly does. The area over the left first plantar metatarsal head looks improved. There is no maceration surrounding skin looks normal. There is some epithelialization attempting to get over this wound. 02/01/16 the areas on the left leg have closed down. The area over the first plantar metatarsal head on the left looks stable to improved although there is some maceration around this. Switch to silver alginate/Aquacel Ag 02/08/16; the areas on his legs remain closed down. The area over the first plantar metatarsal head on the left has advancing epithelialization with only a small open area remaining. Patient always look short of breath when he comes to our clinic but he states his wheezing is improved. We switched to silver alginate last week due to maceration around the wound 4/4the area on his leg remains closed. The area over the left first plantar metatarsal head is fully epithelialized. Patient tells me he has new shoes coming for both areas prosthetic leg on the right than the draining fluid on the left, this wound is certainly not ready for that type of force as of yet 02/22/16; the left first plantar metatarsal head has again split wide open which is exceptionally disappointing. Macerated surrounding skin debridement with subcutaneous tissue. Patient states he was exercising on an exercise bike, this friction over this area may of reopened this. 02/29/16 left first metatarsal head wound is improved.  Requires debridement, I removed some macerated surrounding skin nonviable surface slough. It appears better 03/07/16 left first metatarsal head. Using Aquacel Ag. There is advancing epithelialization but fragile 03/14/16; left first metatarsal head presents with a non viable surface. Debridement is necessary . Subcutaneous tissue is not viable [name], [name] ([id number]) 03/22/16; left first metatarsal head again with a nonviable surface. I aggressively debridement this of nonviable subcutaneous tissue this appears to get down to a healthy base although it is deep. I did culture this wound no empiric antibiotics 03/29/16; once again he comes in with a nonviable surface and purulent drainage of this wound. With simple debridement with a curet the tissue just. This and although this does not probe to bone it is precariously close with a depth of 0.5 cm. 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 04/05/16; a fair amount of drainage which was against his skin under the foam-based dressing according to our intake nurse. continues on Septra for MRSA 04/12/16; left first plantar metatarsal head wound. MRI showed osteomyelitis involving the sesamoid bones in the wound area. He is on Septra for a deep culture isolate that was doxycycline resistant 04/19/16: Patient's wound continues to improve. Review of epic last wk showed an Ef of 35% quoted in discharge summary from 03/25/15 although I don't see where this is coming form. This would make HBO difficult would at least [facility] cardiology clearance Electronic Signature(s) Signed: 04/19/2016 5:53:31 PM By: Baltazar Najjar MD Entered By: Baltazar Najjar on 04/19/2016 08:38:05 [name], [name] ([id number]) -------------------------------------------------------------------------------- Physical Exam Details Patient Name: [name], [name] Date of Service: 04/19/2016 8:00 AM Medical Record Patient Account Number: [id number] Number: Treating RN:  Phillis Haggis Nov 18, 1955 (59 y.o. Other Clinician: Date of Birth/Sex: Male) Treating Shakeera Rightmyer, Tripp Primary Care Physician: SYSTEM, PCP Physician/Extender: G Referring Physician: Joen Laura Weeks in Treatment: 16 Constitutional Sitting or standing Blood Pressure is  within target range for patient.. Pulse regular and within target range for patient.Marland Kitchen Respirations regular, non-labored and within target range.. Temperature is normal and within the target range for the patient.. Patient's appearance is neat and clean. Appears in no acute distress. Well nourished and well developed.Marland Kitchen Respiratory Respiratory effort is easy and symmetric bilaterally. Rate is normal at rest and on room air.. Bilateral breath sounds are clear and equal in all lobes with no wheezes, O2 sat 95%.. Cardiovascular Heart rhythm and rate regular, without murmur or gallop.. Notes wound exam: improved, lite debridement of surface slough and non viable s/c tissue. base appears healthy Electronic Signature(s) Signed: 04/19/2016 5:53:31 PM By: Baltazar Najjar MD Entered By: Baltazar Najjar on 04/19/2016 08:40:09 [name], [name] ([id number]) -------------------------------------------------------------------------------- Physician Orders Details Patient Name: Tyler Milo D. Date of Service: 04/19/2016 8:00 AM Medical Record Patient Account Number: [id number] Number: Treating RN: Phillis Haggis 1956-10-10 (59 y.o. Other Clinician: Date of Birth/Sex: Male) Treating Dorsey Authement, Kassius Primary Care Physician: SYSTEM, PCP Physician/Extender: G Referring Physician: Tedra Senegal in Treatment: 73 Verbal / Phone Orders: Yes Clinician: Pinkerton, Debi Read Back and Verified: Yes Diagnosis Coding Wound Cleansing Wound #2 Left,Plantar Foot o Clean wound with Normal Saline. Anesthetic Wound #2 Left,Plantar Foot o Topical Lidocaine 4% cream applied to wound bed prior to debridement Skin  Barriers/Peri-Wound Care Wound #2 Left,Plantar Foot o Skin Prep Primary Wound Dressing Wound #2 Left,Plantar Foot o Aquacel Ag - or silver alginate only Secondary Dressing Wound #2 Left,Plantar Foot o Boardered Foam Dressing - netting Dressing Change Frequency Wound #2 Left,Plantar Foot o Change dressing every other day. Follow-up Appointments Wound #2 Left,Plantar Foot o Return Appointment in 1 week. Off-Loading Wound #2 Left,Plantar Foot o Open toe surgical shoe with peg assist. [name], [name] ([id number]) Additional Orders / Instructions Wound #2 Left,Plantar Foot o Increase protein intake. o Activity as tolerated Medications-please add to medication list. Wound #2 Left,Plantar Foot o P.O. Antibiotics - Septra DS 1 po BID for 6 wks o Other: - Vitamin C, Zinc, Multivitamin Consults o Cardiology - for pt to go back and see Dr. Kirke Corin Electronic Signature(s) Signed: 04/19/2016 5:53:31 PM By: Baltazar Najjar MD Signed: 04/19/2016 5:55:43 PM By: Alejandro Mulling Entered By: Alejandro Mulling on 04/19/2016 08:45:55 [name], [name] ([id number]) -------------------------------------------------------------------------------- Problem List Details Patient Name: Gali, Isami D. Date of Service: 04/19/2016 8:00 AM Medical Record Patient Account Number: [id number] Number: Treating RN: Phillis Haggis August 05, 1956 (59 y.o. 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 ([id number]) -------------------------------------------------------------------------------- Progress Note Details Patient Name: Therrell, Caylor D. Date of Service: 04/19/2016 8:00 AM Medical Record Patient Account Number: [id number] Number: Treating RN: Phillis Haggis 1956-05-07 (59 y.o. Other Clinician: Date of Birth/Sex: Male) Treating Yuleidy Rappleye, Artemio Primary Care Physician: SYSTEM, PCP Physician/Extender: G Referring Physician: Tedra Senegal in Treatment: 16 Subjective Chief Complaint Information obtained from Patient Patient is here for review of a wound  on his left first metatarsal head that is been there for the last 6 months History of Present Illness (HPI) 12/28/15. This patient is a type II diabetic on insulin. He had vascular interventions 2 weeks ago on 1/17. This included an angioplasty of the left superficial femoral and popliteal arteries as well as a percutaneous transluminal angioplasty of the left posterior tibial artery. Nevertheless he is here for a plantar wound that the patient says is been there for the last 6 months. He initially told us that he is applying Neosporin to this however he tells me also that he has been in a nursing home for the last year. I found that to be a somewhat on usual combination of fax. In any case the patient is a diabetic with diabetic PAD. 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 His ABI on the left side today was 0.62 01/11/16; we have been using Prisma to the wound areas on these first metatarsal head on the left and the small wound on his left anterior ankle. He states that the area on his left metatarsal head was not changed all week, this might  account for why there is so much maceration here. In the meantime he tells me he has fallen and hurt his shoulder x-ray is negative. He is also complaining of shortness of breath 01/18/16; the area on his left anterior lower leg/ankle is resolved. The area over his first metatarsal head looks better. There is less maceration tissue here. Surgical debridement to remove surface slough and nonviable subcutaneous tissue however this generally appears better. 01/25/16; patient arrives today with several areas which are small and superficial on the left leg. This may be from scratching which he admittedly does. The area over the left first plantar metatarsal head looks improved. There is no maceration surrounding skin looks normal. There is some epithelialization attempting to get over this wound. 02/01/16 the areas on the left leg have closed down. The area over the first plantar metatarsal head on the left looks stable to improved although there is some maceration around this. Switch to silver alginate/Aquacel Ag 02/08/16; the areas on his legs remain closed down. The area over the first plantar metatarsal head on the left has advancing epithelialization with only a small open area remaining. Patient always look short of breath when he comes to our clinic but he states his wheezing is improved. We switched to silver alginate last week due to maceration around the wound 4/4the area on his leg remains closed. The area over the left first plantar metatarsal head is fully epithelialized. Patient tells me he has new shoes coming for both areas prosthetic leg on the right than the draining fluid on the left, this wound is certainly not ready for that type of force as of yet 02/22/16; the left first plantar metatarsal head has again split wide open which is exceptionally disappointing. Macerated surrounding skin debridement with subcutaneous tissue. Patient states he was exercising on an exercise bike, this  friction over this area may of reopened this. Flora LippsHAM, Odis D. ([id number]) 02/29/16 left first metatarsal head wound is improved. Requires debridement, I removed some macerated surrounding skin nonviable surface slough. It appears better 03/07/16 left first metatarsal head. Using Aquacel Ag. There is advancing epithelialization but fragile 03/14/16; left first metatarsal head presents with a non viable surface. Debridement is necessary . Subcutaneous tissue is not viable 03/22/16; left first metatarsal head again with a nonviable surface. I  aggressively debridement this of nonviable subcutaneous tissue this appears to get down to a healthy base although it is deep. I did culture this wound no empiric antibiotics 03/29/16; once again he comes in with a nonviable surface and purulent drainage of this wound. With simple debridement with a curet the tissue just. This and although this does not probe to bone it is precariously close with a depth of 0.5 cm. 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 04/05/16; a fair amount of drainage which was against his skin under the foam-based dressing according to our intake nurse. continues on Septra for MRSA 04/12/16; left first plantar metatarsal head wound. MRI showed osteomyelitis involving the sesamoid bones in the wound area. He is on Septra for a deep culture isolate that was doxycycline resistant 04/19/16: Patient's wound continues to improve. Review of epic last wk showed an Ef of 35% quoted in discharge summary from 03/25/15 although I don't see where this is coming form. This would make HBO difficult would at least [facility] cardiology clearance Objective Constitutional Sitting or standing Blood Pressure is within target range for patient.. Pulse regular and within target range for patient.Marland Kitchen Respirations regular, non-labored and within target range.. Temperature is normal and within the target range for the patient.. Patient's appearance is neat and  clean. Appears in no acute distress. Well nourished and well developed.. Vitals Time Taken: 8:11 AM, Height: 67 in, Weight: 195 lbs, BMI: 30.5, Temperature: 97.7 F, Pulse: 59 bpm, Respiratory Rate: 18 breaths/min, Blood Pressure: 144/84 mmHg. Respiratory Respiratory effort is easy and symmetric bilaterally. Rate is normal at rest and on room air.. Bilateral breath sounds are clear and equal in all lobes with no wheezes, O2 sat 95%.. Cardiovascular Heart rhythm and rate regular, without murmur or gallop.. General Notes: wound exam: improved, lite debridement of surface slough and non viable s/c tissue. base appears healthy Integumentary (Hair, Skin) Wound #2 status is Open. Original cause of wound was Gradually Appeared. The wound is located on the Left,Plantar Foot. The wound measures 0.4cm length x 0.4cm width x 0.2cm depth; 0.126cm^2 area and [name], [name] ([id number]) 0.025cm^3 volume. The wound is limited to skin breakdown. There is no tunneling or undermining noted. There is a large amount of serous drainage noted. The wound margin is distinct with the outline attached to the wound base. There is small (1-33%) pink granulation within the wound bed. There is a large (67-100%) amount of necrotic tissue within the wound bed including Adherent Slough. The periwound skin appearance exhibited: Callus, Moist. The periwound skin appearance did not exhibit: Crepitus, Excoriation, Fluctuance, Friable, Induration, Localized Edema, Rash, Scarring, Dry/Scaly, Maceration, Atrophie Blanche, Cyanosis, Ecchymosis, Hemosiderin Staining, Mottled, Pallor, Rubor, Erythema. Periwound temperature was noted as No Abnormality. 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 There was a Skin/Subcutaneous Tissue Debridement ([id number]) debridement with total area of 0.16 sq cm performed by [name], MD. with the following instrument(s): Curette to remove Viable and Non-Viable tissue/material including Exudate, Fibrin/Slough, and Subcutaneous after achieving pain control using Lidocaine 4% Topical Solution. A time out was conducted prior to the start of the procedure. A Minimum  amount of bleeding was controlled with Pressure. The procedure was tolerated well with a pain level of 0 throughout and a pain level of 0 following the procedure. Post Debridement Measurements: 0.4cm length x 0.4cm width x 0.3cm depth; 0.038cm^3 volume. Post procedure Diagnosis Wound #2: Same as Pre-Procedure Plan Wound Cleansing: Wound #2 Left,Plantar Foot: Gallon, Yechiel D. ([id number]) Clean wound with Normal Saline. Anesthetic: Wound #2 Left,Plantar Foot: Topical Lidocaine 4% cream applied to wound bed prior to debridement Skin Barriers/Peri-Wound Care: Wound #2 Left,Plantar Foot: Skin Prep Primary Wound Dressing: Wound #2 Left,Plantar Foot: Aquacel Ag - or silver alginate only Secondary Dressing: Wound #2 Left,Plantar Foot: Boardered Foam Dressing - netting Dressing Change Frequency: Wound #2 Left,Plantar Foot: Change dressing every other day. Follow-up Appointments: Wound #2 Left,Plantar Foot: Return Appointment in 1 week. Off-Loading: Wound #2 Left,Plantar Foot: Open toe surgical shoe with peg assist. Additional Orders / Instructions: Wound #2 Left,Plantar Foot: Increase protein intake. Activity as tolerated Medications-please add to medication list.: Wound #2 Left,Plantar Foot: P.O. Antibiotics - Septra DS 1 po BID for 6 wks Other: - Vitamin C, Zinc, Multivitamin continue Aquacel  AG. continue septra refer to cardiology Dr. Kirke Corin re SOBOE. oEF 35%. Comment on HBO risk Electronic Signature(s) Signed: 04/19/2016 5:53:31 PM By: Baltazar Najjar MD Entered By: Baltazar Najjar on 04/19/2016 08:41:30 Cinquemani, Dereck D. ([id number]) -------------------------------------------------------------------------------- SuperBill Details Patient Name: Tyler Milo D. Date of Service: 04/19/2016 Medical Record Patient Account Number: [id number] Number: Treating RN: Phillis Haggis 1956/09/22 (59 y.o. 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 E11.621 Type 2 diabetes mellitus with foot ulcer E11.51 Type 2 diabetes mellitus with diabetic peripheral angiopathy without gangrene Facility Procedures CPT4 Code: [id number] Description: 11042 - DEB SUBQ TISSUE 20 SQ CM/< ICD-10 Description Diagnosis E11.621 Type 2 diabetes mellitus with foot ulcer Modifier: Quantity: 1 Physician Procedures CPT4 Code: [id number] Description: 11042 - WC PHYS SUBQ TISS 20 SQ CM ICD-10 Description Diagnosis E11.621 Type 2 diabetes mellitus with foot ulcer Modifier: Quantity: 1 Electronic Signature(s) Signed: 04/19/2016 5:53:31 PM By: Baltazar Najjar MD Entered By: Baltazar Najjar on 04/19/2016 08:42:19

## 2016-04-20 NOTE — Progress Notes (Signed)
[name], [name] ([id number]) Visit Report for 04/19/2016 Arrival Information Details Patient Name: [name], [name]. Date of Service: 04/19/2016 8:00 AM Medical Record Number: [id number] Patient Account Number: [id number] Date of Birth/Sex: 19-Jun-1956 (60 y.o. Male) Treating RN: Phillis Haggis Primary Care Physician: SYSTEM, PCP Other Clinician: Referring Physician: Joen Laura Treating Physician/Extender: Altamese [place] in Treatment: 16 Visit Information History Since Last Visit All ordered tests and consults were completed: No Patient Arrived: Wheel Chair Added or deleted any medications: No Arrival Time: 08:08 Any new allergies or adverse reactions: No Accompanied By: self Had a fall or experienced change in No activities of daily living that may affect Transfer Assistance: None risk of falls: Patient Identification Verified: Yes Signs or symptoms of abuse/neglect since last No Secondary Verification Process Yes visito Completed: Hospitalized since last visit: No Patient Requires Transmission-Based No [name] Present Now: No Precautions: Patient Has Alerts: No Electronic Signature(s) Signed: 04/19/2016 5:55:43 PM By: Alejandro Mulling Entered By: Alejandro Mulling on 04/19/2016 08:09:13 Forinash, Abby D. ([id number]) -------------------------------------------------------------------------------- Encounter Discharge Information Details Patient Name: Tyler Milo D. Date of Service: 04/19/2016 8:00 AM Medical Record Number: [id number] Patient Account Number: [id number] Date of Birth/Sex: December 23, 1955 (60 y.o. Male) Treating RN: Phillis Haggis Primary Care Physician: SYSTEM, PCP Other Clinician: Referring Physician: Joen Laura Treating Physician/Extender: Altamese [place] in Treatment: 16 Encounter Discharge Information Items Discharge [name] Level: 0 Discharge Condition: Stable Ambulatory Status: Wheelchair Discharge Destination: Nursing Home Transportation:  Other Accompanied By: self Schedule Follow-up Appointment: Yes Medication Reconciliation completed Yes and provided to Patient/Care Khari Mally: Provided on Clinical Summary of Care: 04/19/2016 Form Type Recipient Paper Patient [facility] Electronic Signature(s) Signed: 04/19/2016 8:36:17 AM By: Gwenlyn Perking Entered By: Gwenlyn Perking on 04/19/2016 08:36:17 Gadway, Jace D. ([id number]) -------------------------------------------------------------------------------- Lower Extremity Assessment Details Patient Name: [name], [name] Date of Service: 04/19/2016 8:00 AM Medical Record Number: [id number] Patient Account Number: [id number] Date of Birth/Sex: 18-Sep-1956 (60 y.o. Male) Treating RN: Phillis Haggis Primary Care Physician: SYSTEM, PCP Other Clinician: Referring Physician: Joen Laura Treating Physician/Extender: [name] Weeks in Treatment: 16 Vascular Assessment Pulses: Posterior Tibial Dorsalis Pedis Palpable: [Left:Yes] Extremity colors, hair growth, and conditions: Extremity Color: [Left:Mottled] Temperature of Extremity: [Left:Warm] Capillary Refill: [Left:< 3 seconds] Electronic Signature(s) Signed: 04/19/2016 5:55:43 PM By: Alejandro Mulling Entered By: Alejandro Mulling on 04/19/2016 08:09:45 [name], [name] ([id number]) -------------------------------------------------------------------------------- Multi Wound Chart Details Patient Name: [name], [name] Date of Service: 04/19/2016 8:00 AM Medical Record Number: [id number] Patient Account Number: [id number] Date of Birth/Sex: 1956-03-20 (60 y.o. Male) Treating RN: Phillis Haggis Primary Care Physician: SYSTEM, PCP Other Clinician: Referring Physician: Joen Laura Treating Physician/Extender: [name] Weeks in Treatment: 16 Vital Signs Height(in): 67 Pulse(bpm): 59 Weight(lbs): 195 Blood Pressure 144/84 (mmHg): Body Mass Index(BMI): 31 Temperature(F): 97.7 Respiratory  Rate 18 (breaths/min): Photos: [2:No Photos] [N/A:N/A] Wound Location: [2:Left Foot - Plantar] [N/A:N/A] Wounding Event: [2:Gradually Appeared] [N/A:N/A] Primary Etiology: [2:Diabetic Wound/Ulcer of the Lower Extremity] [N/A:N/A] Comorbid History: [2:Anemia, Chronic Obstructive Pulmonary Disease (COPD), Arrhythmia, Congestive Heart Failure, Coronary Artery Disease, Hypertension, Type II Diabetes, Neuropathy] [N/A:N/A] Date Acquired: [2:12/21/2014] [N/A:N/A] Weeks of Treatment: [2:16] [N/A:N/A] Wound Status: [2:Open] [N/A:N/A] Measurements L x W x D 0.4x0.4x0.1 [N/A:N/A] (cm) Area (cm) : [2:0.126] [N/A:N/A] Volume (cm) : [2:0.013] [N/A:N/A] % Reduction in Area: [2:83.90%] [N/A:N/A] % Reduction in Volume: 94.50% [N/A:N/A] Classification: [2:Grade 1] [N/A:N/A] Exudate Amount: [2:Large] [N/A:N/A] Exudate Type: [2:Serous] [N/A:N/A] Exudate Color: [2:amber] [N/A:N/A] Wound Margin: [2:Distinct, outline attached] [N/A:N/A] Granulation Amount: [2:Small (1-33%)] [N/A:N/A] Granulation Quality: [2:Pink] [  N/A:N/A] Necrotic Amount: [2:Large (67-100%)] [N/A:N/A] Exposed Structures: Fascia: No N/A N/A Fat: No Tendon: No Muscle: No Joint: No Bone: No Limited to Skin Breakdown Epithelialization: Medium (34-66%) N/A N/A Periwound Skin Texture: Callus: Yes N/A N/A Edema: No Excoriation: No Induration: No Crepitus: No Fluctuance: No Friable: No Rash: No Scarring: No Periwound Skin Moist: Yes N/A N/A Moisture: Maceration: No Dry/Scaly: No Periwound Skin Color: Atrophie Blanche: No N/A N/A Cyanosis: No Ecchymosis: No Erythema: No Hemosiderin Staining: No Mottled: No Pallor: No Rubor: No Temperature: No Abnormality N/A N/A Tenderness on No N/A N/A Palpation: Wound Preparation: Ulcer Cleansing: N/A N/A Rinsed/Irrigated with Saline Topical Anesthetic Applied: Other: lidocaine 4% Treatment Notes Electronic Signature(s) Signed: 04/19/2016 5:55:43 PM By: Alejandro MullingPinkerton,  Debra Entered By: Alejandro MullingPinkerton, Debra on 04/19/2016 08:16:36 [name], [name] ([id number]) -------------------------------------------------------------------------------- Multi-Disciplinary Care Plan Details Patient Name: [name], [name] Date of Service: 04/19/2016 8:00 AM Medical Record Number: [id number] Patient Account Number: [id number] Date of Birth/Sex: 1956/03/01 66(59 y.o. Male) Treating RN: Phillis HaggisPinkerton, Debi Primary Care Physician: SYSTEM, PCP Other Clinician: Referring Physician: Joen LauraBLISS, LAURA Treating Physician/Extender: Altamese CarolinaOBSON, Davyn G Weeks in Treatment: 16 Active Inactive Abuse / Safety / Falls / Self Care Management Nursing Diagnoses: Impaired home maintenance Impaired physical mobility Knowledge deficit related to: safety; personal, health (wound), emergency Potential for falls Self care deficit: actual or potential Goals: Patient will remain injury free Date Initiated: 12/28/2015 Goal Status: Active Patient/caregiver will verbalize understanding of skin care regimen Date Initiated: 12/28/2015 Goal Status: Active Patient/caregiver will verbalize/demonstrate measure taken to improve self care Date Initiated: 12/28/2015 Goal Status: Active Patient/caregiver will verbalize/demonstrate measures taken to improve the patient's personal safety Date Initiated: 12/28/2015 Goal Status: Active Patient/caregiver will verbalize/demonstrate measures taken to prevent injury and/or falls Date Initiated: 12/28/2015 Goal Status: Active Patient/caregiver will verbalize/demonstrate understanding of what to do in case of emergency Date Initiated: 12/28/2015 Goal Status: Active Interventions: Assess fall risk on admission and as needed Assess: immobility, friction, shearing, incontinence upon admission and as needed Assess impairment of mobility on admission and as needed per policy Assess self care needs on admission and as needed Provide education on basic hygiene Flora LippsHAM, Kamal D.  ([id number]) Provide education on fall prevention Provide education on personal and home safety Provide education on safe transfers Treatment Activities: Education provided on Basic Hygiene : 03/14/2016 Notes: Orientation to the Wound Care Program Nursing Diagnoses: Knowledge deficit related to the wound healing center program Goals: Patient/caregiver will verbalize understanding of the Wound Healing Center Program Date Initiated: 12/28/2015 Goal Status: Active Interventions: Provide education on orientation to the wound center Notes: Venous Leg Ulcer Nursing Diagnoses: Knowledge deficit related to disease process and management Potential for venous Insuffiency (use before diagnosis confirmed) Goals: Non-invasive venous studies are completed as ordered Date Initiated: 12/28/2015 Goal Status: Active Patient/caregiver will verbalize understanding of disease process and disease management Date Initiated: 12/28/2015 Goal Status: Active Verify adequate tissue perfusion prior to therapeutic compression application Date Initiated: 12/28/2015 Goal Status: Active Interventions: Assess peripheral edema status every visit. Provide education on venous insufficiency Notes: [name], [name] ([id number]) Wound/Skin Impairment Nursing Diagnoses: Impaired tissue integrity Knowledge deficit related to smoking impact on wound healing Knowledge deficit related to ulceration/compromised skin integrity Goals: Patient/caregiver will verbalize understanding of skin care regimen Date Initiated: 12/28/2015 Goal Status: Active Ulcer/skin breakdown will have a volume reduction of 30% by week 4 Date Initiated: 12/28/2015 Goal Status: Active Ulcer/skin breakdown will have a volume reduction of 50% by week 8 Date Initiated: 12/28/2015 Goal Status: Active Ulcer/skin breakdown  will have a volume reduction of 80% by week 12 Date Initiated: 12/28/2015 Goal Status: Active Ulcer/skin breakdown will heal within  14 weeks Date Initiated: 12/28/2015 Goal Status: Active Interventions: Assess patient/caregiver ability to obtain necessary supplies Assess patient/caregiver ability to perform ulcer/skin care regimen upon admission and as needed Assess ulceration(s) every visit Provide education on ulcer and skin care Notes: Electronic Signature(s) Signed: 04/19/2016 5:55:43 PM By: Alejandro Mulling Entered By: Alejandro Mulling on 04/19/2016 08:16:30 [name], [name] ([id number]) -------------------------------------------------------------------------------- [name] Assessment Details Patient Name: Tyler Milo D. Date of Service: 04/19/2016 8:00 AM Medical Record Number: [id number] Patient Account Number: [id number] Date of Birth/Sex: 1956/09/08 (60 y.o. Male) Treating RN: Phillis Haggis Primary Care Physician: SYSTEM, PCP Other Clinician: Referring Physician: Joen Laura Treating Physician/Extender: [name] Weeks in Treatment: 16 Active Problems Location of [name] Severity and Description of [name] Patient Has Paino No Site Locations [name] Management and Medication Current [name] Management: Electronic Signature(s) Signed: 04/19/2016 5:55:43 PM By: Alejandro Mulling Entered By: Alejandro Mulling on 04/19/2016 08:09:19 [name], [name] ([id number]) -------------------------------------------------------------------------------- Patient/Caregiver Education Details Patient Name: Tyler Milo D. Date of Service: 04/19/2016 8:00 AM Medical Record Number: [id number] Patient Account Number: [id number] Date of Birth/Gender: [birth year] (60 y.o. Male) Treating RN: Phillis Haggis Primary Care Physician: SYSTEM, PCP Other Clinician: Referring Physician: Joen Laura Treating Physician/Extender: Altamese [place] in Treatment: 16 Education Assessment Education Provided To: Patient Education Topics Provided Wound/Skin Impairment: Handouts: Other: change dressing as ordered Methods: Demonstration,  Explain/Verbal Responses: State content correctly Electronic Signature(s) Signed: 04/19/2016 5:55:43 PM By: Alejandro Mulling Entered By: Alejandro Mulling on 04/19/2016 08:18:33 [name], [name] ([id number]) -------------------------------------------------------------------------------- Wound Assessment Details Patient Name: [name], [name] Date of Service: 04/19/2016 8:00 AM Medical Record Number: [id number] Patient Account Number: [id number] Date of Birth/Sex: [birth year] (60 y.o. Male) Treating RN: Phillis Haggis Primary Care Physician: SYSTEM, PCP Other Clinician: Referring Physician: Joen Laura Treating Physician/Extender: [name] Weeks in Treatment: 16 Wound Status Wound Number: 2 Primary Diabetic Wound/Ulcer of the Lower Etiology: Extremity Wound Location: Left, Plantar Foot Wound Open Wounding Event: Gradually Appeared Status: Date Acquired: 12/21/2014 Comorbid Anemia, Chronic Obstructive Pulmonary Weeks Of Treatment: 16 History: Disease (COPD), Arrhythmia, Clustered Wound: No Congestive Heart Failure, Coronary Artery Disease, Hypertension, Type II Diabetes, Neuropathy Photos Photo Uploaded By: Alejandro Mulling on 04/19/2016 16:49:05 Wound Measurements Length: (cm) 0.4 Width: (cm) 0.4 Depth: (cm) 0.2 Area: (cm) 0.126 Volume: (cm) 0.025 % Reduction in Area: 83.9% % Reduction in Volume: 89.4% Epithelialization: Medium (34-66%) Tunneling: No Undermining: No Wound Description Classification: Grade 1 Wound Margin: Distinct, outline attached Exudate Amount: Large Exudate Type: Serous Exudate Color: amber Foul Odor After Cleansing: No Wound Bed Granulation Amount: Small (1-33%) Exposed Structure Granulation Quality: Pink Fascia Exposed: No Cunnington, Nikko D. ([id number]) Necrotic Amount: Large (67-100%) Fat Layer Exposed: No Necrotic Quality: Adherent Slough Tendon Exposed: No Muscle Exposed: No Joint Exposed: No Bone Exposed: No Limited to Skin  Breakdown Periwound Skin Texture Texture Color No Abnormalities Noted: No No Abnormalities Noted: No Callus: Yes Atrophie Blanche: No Crepitus: No Cyanosis: No Excoriation: No Ecchymosis: No Fluctuance: No Erythema: No Friable: No Hemosiderin Staining: No Induration: No Mottled: No Localized Edema: No Pallor: No Rash: No Rubor: No Scarring: No Temperature / [name] Moisture Temperature: No Abnormality No Abnormalities Noted: No Dry / Scaly: No Maceration: No Moist: Yes Wound Preparation Ulcer Cleansing: Rinsed/Irrigated with Saline Topical Anesthetic Applied: Other: lidocaine 4%, Treatment Notes Wound #2 (Left, Plantar Foot) 1. Cleansed with: Clean wound with Normal Saline 2.  Anesthetic Topical Lidocaine 4% cream to wound bed prior to debridement 3. Peri-wound Care: Skin Prep 4. Dressing Applied: Aquacel Ag 5. Secondary Dressing Applied Bordered Foam Dressing Notes netting and Darco Electronic Signature(s) Signed: 04/19/2016 5:55:43 PM By: Rosalee Kaufman ([id number]) Entered By: Alejandro Mulling on 04/19/2016 08:21:50 Cinquemani, Syaire D. ([id number]) -------------------------------------------------------------------------------- Vitals Details Patient Name: Spratt, Kaspian D. Date of Service: 04/19/2016 8:00 AM Medical Record Number: [id number] Patient Account Number: [id number] Date of Birth/Sex: December 16, 1955 (60 y.o. Male) Treating RN: Ashok Cordia, Debi Primary Care Physician: SYSTEM, PCP Other Clinician: Referring Physician: Joen Laura Treating Physician/Extender: [name] Weeks in Treatment: 16 Vital Signs Time Taken: 08:11 Temperature (F): 97.7 Height (in): 67 Pulse (bpm): 59 Weight (lbs): 195 Respiratory Rate (breaths/min): 18 Body Mass Index (BMI): 30.5 Blood Pressure (mmHg): 144/84 Reference Range: 80 - 120 mg / dl Electronic Signature(s) Signed: 04/19/2016 5:55:43 PM By: Alejandro Mulling Entered By: Alejandro Mulling on  04/19/2016 08:12:32

## 2016-04-20 NOTE — Telephone Encounter (Signed)
lmov to schedule appointment being referred to us by Wound center Dr Windell Mouldingoberson's office.

## 2016-04-26 ENCOUNTER — Encounter: Payer: Medicare Other | Admitting: Internal Medicine

## 2016-04-26 DIAGNOSIS — E11621 Type 2 diabetes mellitus with foot ulcer: Secondary | ICD-10-CM | POA: Diagnosis not present

## 2016-04-28 ENCOUNTER — Other Ambulatory Visit: Payer: Self-pay | Admitting: Internal Medicine

## 2016-04-28 DIAGNOSIS — S81802D Unspecified open wound, left lower leg, subsequent encounter: Secondary | ICD-10-CM

## 2016-05-02 ENCOUNTER — Ambulatory Visit: Payer: Medicare Other

## 2016-05-02 ENCOUNTER — Other Ambulatory Visit: Payer: Self-pay | Admitting: Internal Medicine

## 2016-05-02 ENCOUNTER — Encounter: Payer: Self-pay | Admitting: Cardiovascular Disease

## 2016-05-02 ENCOUNTER — Ambulatory Visit (INDEPENDENT_AMBULATORY_CARE_PROVIDER_SITE_OTHER): Payer: Medicare Other | Admitting: Cardiovascular Disease

## 2016-05-02 VITALS — BP 140/90 | HR 61 | Ht 67.0 in

## 2016-05-02 DIAGNOSIS — I779 Disorder of arteries and arterioles, unspecified: Secondary | ICD-10-CM

## 2016-05-02 DIAGNOSIS — Z959 Presence of cardiac and vascular implant and graft, unspecified: Secondary | ICD-10-CM

## 2016-05-02 DIAGNOSIS — I10 Essential (primary) hypertension: Secondary | ICD-10-CM | POA: Diagnosis not present

## 2016-05-02 DIAGNOSIS — R0600 Dyspnea, unspecified: Secondary | ICD-10-CM | POA: Diagnosis not present

## 2016-05-02 DIAGNOSIS — S81802D Unspecified open wound, left lower leg, subsequent encounter: Secondary | ICD-10-CM | POA: Diagnosis not present

## 2016-05-02 DIAGNOSIS — I2510 Atherosclerotic heart disease of native coronary artery without angina pectoris: Secondary | ICD-10-CM | POA: Diagnosis not present

## 2016-05-02 NOTE — Progress Notes (Signed)
Cardiology Office Note   Date:  05/02/2016   ID:  [name], DOB [birth year], MRN [id number]  PCP:  Pcp Not In System  Cardiologist:   Lorine Bears, MD   Chief Complaint  Patient presents with  . Follow-up    for wound. no chest pain, sob or swelling.      History of Present Illness: [name] is a 60 y.o. male who presents for evaluation of nonhealing ulcer on the bottom of the left foot. He is very well-known to me and has extensive cardiovascular history. He has known history of coronary artery disease, paroxysmal atrial fibrillation and chronic systolic heart failure. He had CABG in the past with subsequent stenting of the left circumflex and left main coronary arteries at [facility]. He is known to have occluded vein graft with only patent LIMA to LAD. Most recent cardiac catheterization in July 2014 showed occluded left circumflex stent. He has been treated medically since then.  He has known history of diabetes, paroxysmal atrial fibrillation  and peripheral vascular disease with right below the knee amputation.  For his vascular disease, he has been following up with Dr. Lorretta Harp who performed an angiogram in January with angioplasty of the left SFA, popliteal and anterior tibial arteries. The patient has not been seen by me for more than a year. He was hospitalized in April 2016 with rhabdomyolysis and acute renal failure. It was mostly related to a poor social situation at home with no support. He was on the floor for 2 days. He was rehospitalized in May 2016 with GI bleed. It appears that Eliquis was stopped at that time. The patient is currently staying at [facility]. He is being followed at the wound clinic and has a nonhealing ulcer on the bottom of the left foot. He denies any chest pain or palpitations. He complains of increased exertional dyspnea.    Past Medical History  Diagnosis Date  . HTN (hypertension)   . Diabetic foot ulcers (HCC) 10/2011   bilateral plantar 1st MTP ulcers, with deep tissue infection right foot 10/2011   . Anxiety   . Headache(784.0)   . NSTEMI (non-ST elevated myocardial infarction) (HCC) 10/17/2011  . Diabetes mellitus   . Pulmonary emphysema (HCC)   . Obesity   . Paroxysmal atrial flutter (HCC) 10/15/2011  . CAD (coronary artery disease), autologous vein bypass graft     s/p CABG in 80's or 90's, multiple caths/PCI. Presented in June of 2014 with non-ST elevation myocardial infarction. Cardiac catheterization done by Dr. Fransico Mar at [facility] showed severe three-vessel coronary artery disease (nondominant RCA )with patent LIMA to LAD, occluded SVG to OM1(new) and occluded SVG to PDA. There was significant ostial and distal left main stenosis. Attempted left main/LCX PCI  . MI (myocardial infarction) (HCC)   . Chronic systolic heart failure (HCC)     Ejection fraction 35%  . Broken femur (HCC)   . CHF (congestive heart failure) (HCC)   . Major depression (HCC) unk  . PVD (peripheral vascular disease) (HCC) unk  . Rhabdomyolysis   . Atrial fibrillation (HCC)   . Dementia   . Anginal pain (HCC)   . Encephalopathy   . Cellulitis     Past Surgical History  Procedure Laterality Date  . Coronary artery bypass graft    . No past surgeries    . Amputation  10/17/2011    Procedure: AMPUTATION RAY;  Surgeon: Cammy Copa;  Location: [facility] OR;  Service: Orthopedics;  Laterality: Right;  First and Second Ray Amputation  . Amputation  11/01/2011    Procedure: AMPUTATION BELOW KNEE;  Surgeon: Nadara Mustard, MD;  Location: [facility] OR;  Service: Orthopedics;  Laterality: Right;  Right Below Knee Amputation  . Rt bka    . Cardiac catheterization  09/2006    [facility];EF 60%  . Cardiac catheterization  05/30/13    [facility]: Occluded SVG to RCA and SVG to OM (both chronic) , patent LIMA to LAD which gives collaterals to LCx and RCA. Subtotal occlusion of the native left circumflex at the site of PCI done at [facility]. Ejection fraction 55%    . Coronary angioplasty    . Vascular surgery    . Peripheral vascular catheterization Left 06/22/2015    Procedure: Lower Extremity Angiography;  Surgeon: Renford Dills, MD;  Location: [facility] INVASIVE CV LAB;  Service: Cardiovascular;  Laterality: Left;  . Peripheral vascular catheterization  06/22/2015    Procedure: Lower Extremity Intervention;  Surgeon: Renford Dills, MD;  Location: [facility] INVASIVE CV LAB;  Service: Cardiovascular;;  . Peripheral vascular catheterization N/A 12/07/2015    Procedure: Abdominal Aortogram w/Lower Extremity;  Surgeon: Renford Dills, MD;  Location: [facility] INVASIVE CV LAB;  Service: Cardiovascular;  Laterality: N/A;  . Peripheral vascular catheterization  12/07/2015    Procedure: Lower Extremity Intervention;  Surgeon: Renford Dills, MD;  Location: [facility] INVASIVE CV LAB;  Service: Cardiovascular;;     Current Outpatient Prescriptions  Medication Sig Dispense Refill  . acetaminophen (TYLENOL) 325 MG tablet Take 650 mg by mouth every 6 (six) hours as needed for mild pain.    Marland Kitchen aspirin EC 81 MG tablet Take 1 tablet (81 mg total) by mouth daily. 90 tablet 3  . cetirizine (ZYRTEC) 10 MG tablet Take 10 mg by mouth daily.    . clopidogrel (PLAVIX) 75 MG tablet Take 1 tablet (75 mg total) by mouth daily. 30 tablet 5  . furosemide (LASIX) 20 MG tablet Take 20 mg by mouth daily.    Marland Kitchen gabapentin (NEURONTIN) 300 MG capsule Take 300 mg by mouth 3 (three) times daily.    Marland Kitchen guaiFENesin (ROBITUSSIN) 100 MG/5ML SOLN Take 20 mLs by mouth every 6 (six) hours as needed for cough or to loosen phlegm.    . insulin aspart (NOVOLOG) 100 UNIT/ML injection Inject 0-15 Units into the skin 3 (three) times daily with meals. 10 mL 11  . insulin detemir (LEVEMIR) 100 UNIT/ML injection Inject 0.23 mLs (23 Units total) into the skin daily. 10 mL 11  . lisinopril (PRINIVIL,ZESTRIL) 2.5 MG tablet Take 2.5 mg by mouth daily.    . magnesium hydroxide (MILK OF MAGNESIA) 800 MG/5ML suspension  Take 30 mLs by mouth daily as needed for constipation.    . nitroGLYCERIN (NITROSTAT) 0.4 MG SL tablet Place 1 tablet (0.4 mg total) under the tongue every 5 (five) minutes as needed for chest pain. 25 tablet 3  . oxycodone (OXY-IR) 5 MG capsule Take 5 mg by mouth every 6 (six) hours as needed for pain. And give 1 tab q12hrs for pain    . sennosides-docusate sodium (SENOKOT-S) 8.6-50 MG tablet Take 2 tablets by mouth 2 (two) times daily.    . sertraline (ZOLOFT) 100 MG tablet Take 100 mg by mouth daily.    . simvastatin (ZOCOR) 10 MG tablet Take 10 mg by mouth at bedtime.    . talc (ZEASORB) powder Apply 1 application topically every 8 (eight) hours as needed.  No current facility-administered medications for this visit.    Allergies:   Review of patient's allergies indicates no known allergies.    Social History:  The patient  reports that he quit smoking about 14 years ago. His smoking use included Cigarettes. He has a 17.5 pack-year smoking history. He has never used smokeless tobacco. He reports that he does not drink alcohol or use illicit drugs.   Family History:  The patient's family history includes Alzheimer's disease in his father; Anemia in his mother; Aortic aneurysm in his mother; Benign prostatic hyperplasia in his father; Coronary artery disease in his brother and father; Heart attack (age of onset: 2057) in his brother; Lymphoma in his sister; Melanoma in his father.    ROS:  Please see the history of present illness.   Otherwise, review of systems are positive for none.   All other systems are reviewed and negative.    PHYSICAL EXAM: VS:  BP 140/90 mmHg  Pulse 61  Ht 5\' 7"  (1.702 m)  Wt  , BMI There is no weight on file to calculate BMI. GEN: Well nourished, well developed, in no acute distress HEENT: normal Neck: no JVD, carotid bruits, or masses Cardiac: RRR; no murmurs, rubs, or gallops,no edema  Respiratory:  clear to auscultation bilaterally, normal work of  breathing GI: soft, nontender, nondistended, + BS MS: no deformity or atrophy Skin: warm and dry, no rash Neuro:  Strength and sensation are intact Psych: euthymic mood, full affect Vascular: Distal pulses are not palpable.  EKG:  EKG is ordered today. The ekg ordered today demonstrates normal sinus rhythm with nonspecific IVCD and old inferior infarct.  Recent Labs: 06/22/2015: Potassium 4.5; Sodium 136 12/07/2015: BUN 34* 04/05/2016: Creatinine, Ser 1.30*    Lipid Panel    Component Value Date/Time   CHOL 112 05/30/2013 0659   CHOL 150 10/15/2011 0912   TRIG 131 05/30/2013 0659   TRIG 183* 10/15/2011 0912   HDL 30* 05/30/2013 0659   HDL 13* 10/15/2011 0912   CHOLHDL 11.5 10/15/2011 0912   VLDL 26 05/30/2013 0659   VLDL 37 10/15/2011 0912   LDLCALC 56 05/30/2013 0659   LDLCALC 100* 10/15/2011 0912      Wt Readings from Last 3 Encounters:  12/07/15 185 lb (83.915 kg)  06/22/15 185 lb (83.915 kg)  03/30/15 194 lb (87.998 kg)        ASSESSMENT AND PLAN:  1.  Peripheral arterial disease with nonhealing ulcer on the bottom of the left foot: Suspect significant worsening of left leg ischemia. The patient's previous vascular interventions have been done by Dr. Gilda CreaseSchnier most recently in January of this year. Thus, I referred him back for evaluation and possible need for repeat angiogram.  2. Chronic systolic heart failure: He reports significant worsening of exertional dyspnea. I requested an echocardiogram. He is not longer on carvedilol for unclear reasons but could be due to bradycardia.  3. Coronary artery disease: No chest pain. Continue medical therapy. He is on dual antiplatelet therapy.  4. Paroxysmal atrial fibrillation: Anticoagulation was discontinued last year after he presented with GI bleed. He is currently on dual antiplatelet therapy for PAD. If he develops recurrent atrial fibrillation, I recommend resuming anticoagulation.  5. Hyperlipidemia: Currently on  simvastatin.     Disposition:   FU with me in 3 months  Signed,  Lorine BearsMuhammad Arida, MD  05/02/2016 3:24 PM    [facility]

## 2016-05-02 NOTE — Patient Instructions (Addendum)
Medication Instructions:  Your physician recommends that you continue on your current medications as directed. Please refer to the Current Medication list given to you today.   Labwork: none  Testing/Procedures: Your physician has requested that you have an echocardiogram. Echocardiography is a painless test that uses sound waves to create images of your heart. It provides your doctor with information about the size and shape of your heart and how well your heart's chambers and valves are working. This procedure takes approximately one hour. There are no restrictions for this procedure.    Follow-Up: Your physician recommends that you schedule a follow-up appointment in: 3 months with Dr. Kirke CorinArida.   You have a follow up appointment with Dr. Gilda CreaseSchnier, [facility] July 3 2:00 [phone]  Any Other Special Instructions Will Be Listed Below (If Applicable).     If you need a refill on your cardiac medications before your next appointment, please call your pharmacy.  Echocardiogram An echocardiogram, or echocardiography, uses sound waves (ultrasound) to produce an image of your heart. The echocardiogram is simple, painless, obtained within a short period of time, and offers valuable information to your health care provider. The images from an echocardiogram can provide information such as:  Evidence of coronary artery disease (CAD).  Heart size.  Heart muscle function.  Heart valve function.  Aneurysm detection.  Evidence of a past heart attack.  Fluid buildup around the heart.  Heart muscle thickening.  Assess heart valve function. LET NavosYOUR HEALTH CARE PROVIDER KNOW ABOUT:  Any allergies you have.  All medicines you are taking, including vitamins, herbs, eye drops, creams, and over-the-counter medicines.  Previous problems you or members of your family have had with the use of anesthetics.  Any blood disorders you have.  Previous surgeries  you have had.  Medical conditions you have.  Possibility of pregnancy, if this applies. BEFORE THE PROCEDURE  No special preparation is needed. Eat and drink normally.  PROCEDURE   In order to produce an image of your heart, gel will be applied to your chest and a wand-like tool (transducer) will be moved over your chest. The gel will help transmit the sound waves from the transducer. The sound waves will harmlessly bounce off your heart to allow the heart images to be captured in real-time motion. These images will then be recorded.  You may need an IV to receive a medicine that improves the quality of the pictures. AFTER THE PROCEDURE You may return to your normal schedule including diet, activities, and medicines, unless your health care provider tells you otherwise.   This information is not intended to replace advice given to you by your health care provider. Make sure you discuss any questions you have with your health care provider.   Document Released: 10/27/2000 Document Revised: 11/20/2014 Document Reviewed: 07/07/2013 Elsevier Interactive Patient Education [employer]2016 [employer]

## 2016-05-03 ENCOUNTER — Encounter: Payer: Medicare Other | Admitting: Internal Medicine

## 2016-05-03 DIAGNOSIS — E11621 Type 2 diabetes mellitus with foot ulcer: Secondary | ICD-10-CM | POA: Diagnosis not present

## 2016-05-04 NOTE — Progress Notes (Signed)
[name], [name] ([id number]) Visit Report for 05/03/2016 Chief Complaint Document Details Patient Name: [name], [name] Date of Service: 05/03/2016 1:30 PM Medical Record Patient Account Number: [id number] Number: Treating RN: Clover MealyAfful, RN, BSN, Frank Feb 20, 1956 [phone](59 y.o. Other Clinician: Date of Birth/Sex: Male) Treating Frank, Tyler Primary Care Physician: SYSTEM, PCP Physician/Extender: Frank Referring Physician: Tedra SenegalBLISS, LAURA Weeks in Treatment: 18 Information Obtained from: Patient Chief Complaint Patient is here for review of a wound on his left first metatarsal head that is been there for the last 6 months Electronic Signature(s) Signed: 05/03/2016 4:37:54 PM By: [name] Entered By: [name] on 05/03/2016 14:41:40 [name], [name] ([id number]) -------------------------------------------------------------------------------- Debridement Details Patient Name: [name], [name] Date of Service: 05/03/2016 1:30 PM Medical Record Patient Account Number: [id number] Number: Treating RN: Clover MealyAfful, RN, BSN, Frank Feb 20, 1956 [phone](59 y.o. Other Clinician: Date of Birth/Sex: Male) Treating Frank, [name] Primary Care Physician: SYSTEM, PCP Physician/Extender: Frank Referring Physician: Tedra SenegalBLISS, LAURA Weeks in Treatment: 18 Debridement Performed for Wound #2 Left,Plantar Foot Assessment: Performed By: Physician [name] Debridement: Debridement Pre-procedure Yes Verification/Time Out Taken: Start Time: 13:51 Pain Control: Lidocaine 4% Topical Solution Level: Skin/Subcutaneous Tissue Total Area Debrided (L x 0.3 (cm) x 0.3 (cm) = 0.09 (cm) W): Tissue and other Non-Viable, Fibrin/Slough, Subcutaneous material debrided: Instrument: Curette Bleeding: Minimum Hemostasis Achieved: Pressure End Time: 13:53 Procedural Pain: 0 Post Procedural Pain: 0 Response to Treatment: Procedure was tolerated well Post Debridement Measurements of Total Wound Length:  (cm) 0.3 Width: (cm) 0.3 Depth: (cm) 0.1 Volume: (cm) 0.007 Post Procedure Diagnosis Same as Pre-procedure Electronic Signature(s) Signed: 05/03/2016 4:37:54 PM By: Tyler Najjarobson, Abisai Frank Signed: 05/03/2016 4:58:21 PM By: [name] BSN, RN Entered By: [name] on 05/03/2016 14:41:25 [name], [name] ([id number]) -------------------------------------------------------------------------------- HPI Details Patient Name: [name], [name] Date of Service: 05/03/2016 1:30 PM Medical Record Patient Account Number: [id number] Number: Treating RN: Clover MealyAfful, RN, BSN, Frank Feb 20, 1956 [phone](59 y.o. Other Clinician: Date of Birth/Sex: Male) Treating Frank, Dewaun Primary Care Physician: SYSTEM, PCP Physician/Extender: Frank Referring Physician: Tedra SenegalBLISS, LAURA Weeks in Treatment: 18 History of Present Illness HPI Description: 12/28/15. This patient is a type II diabetic on insulin. He had vascular interventions 2 weeks ago on 1/17. This included an angioplasty of the left superficial femoral and popliteal arteries as well as a percutaneous transluminal angioplasty of the left posterior tibial artery. Nevertheless he is here for a plantar wound that the patient says is been there for the last 6 months. He initially told us that he is applying Neosporin to this however he tells me also that he has been in a nursing home for the last year. I found that to be a somewhat on usual combination of fax. In any case the patient is a diabetic with diabetic PAD. 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 His ABI on the left side today was 0.62 01/11/16; we have been using Prisma to the wound areas on these first metatarsal head on the left and the small wound on his left anterior ankle. He states that the area on his left metatarsal head was not changed all week, this might account for why there is so much maceration here. In the meantime he tells me he has fallen and hurt  his shoulder x-ray is negative. He is also complaining of shortness of breath 01/18/16; the area on his left anterior lower leg/ankle is resolved. The area over his first metatarsal head  looks better. There is less maceration tissue here. Surgical debridement to remove surface slough and nonviable subcutaneous tissue however this generally appears better. 01/25/16; patient arrives today with several areas which are small and superficial on the left leg. This may be from scratching which he admittedly does. The area over the left first plantar metatarsal head looks improved. There is no maceration surrounding skin looks normal. There is some epithelialization attempting to get over this wound. 02/01/16 the areas on the left leg have closed down. The area over the first plantar metatarsal head on the left looks stable to improved although there is some maceration around this. Switch to silver alginate/Aquacel Ag 02/08/16; the areas on his legs remain closed down. The area over the first plantar metatarsal head on the left has advancing epithelialization with only a small open area remaining. Patient always look short of breath when he comes to our clinic but he states his wheezing is improved. We switched to silver alginate last week due to maceration around the wound 4/4the area on his leg remains closed. The area over the left first plantar metatarsal head is fully epithelialized. Patient tells me he has new shoes coming for both areas prosthetic leg on the right than the draining fluid on the left, this wound is certainly not ready for that type of force as of yet 02/22/16; the left first plantar metatarsal head has again split wide open which is exceptionally disappointing. Macerated surrounding skin debridement with subcutaneous tissue. Patient states he was exercising on an exercise bike, this friction over this area may of reopened this. 02/29/16 left first metatarsal head wound is improved.  Requires debridement, I removed some macerated surrounding skin nonviable surface slough. It appears better 03/07/16 left first metatarsal head. Using Aquacel Ag. There is advancing epithelialization but fragile 03/14/16; left first metatarsal head presents with a non viable surface. Debridement is necessary . Subcutaneous tissue is not viable [name], [name] ([id number]) 03/22/16; left first metatarsal head again with a nonviable surface. I aggressively debridement this of nonviable subcutaneous tissue this appears to get down to a healthy base although it is deep. I did culture this wound no empiric antibiotics 03/29/16; once again he comes in with a nonviable surface and purulent drainage of this wound. With simple debridement with a curet the tissue just. This and although this does not probe to bone it is precariously close with a depth of 0.5 cm. 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 04/05/16; a fair amount of drainage which was against his skin under the foam-based dressing according to our intake nurse. continues on Septra for MRSA 04/12/16; left first plantar metatarsal head wound. MRI showed osteomyelitis involving the sesamoid bones in the wound area. He is on Septra for a deep culture isolate that was doxycycline resistant 04/19/16: Patient's wound continues to improve. Review of epic last wk showed an Ef of 35% quoted in discharge summary from 03/25/15 although I don't see where this is coming form. This would make HBO difficult would at least [facility] cardiology clearance 04/26/16; patient's wounds continued to improve. This is on the base of his remaining foot the. He has follow- up with cardiology with regards to his cardiac risk for HBO as well as if any additional procedures might be helpful in terms of his PAD 05/03/16; the patient saw cardiology who unfortunately simply referred him back to vascular surgery. I was hoping for more of a comprehensive review about whether he  might need an arteriogram, as  well as whether his cardiomyopathy would preclude him from hyperbarics. In any case the patient continues to make almost surprising improvement in his wound condition Electronic Signature(s) Signed: 05/03/2016 4:37:54 PM By: Tyler Najjarobson, Trever Frank Entered By: Tyler Najjarobson, Edenilson on 05/03/2016 14:45:13 Hileman, Marvin D. ([id number]) -------------------------------------------------------------------------------- Physical Exam Details Patient Name: [name], [name] Date of Service: 05/03/2016 1:30 PM Medical Record Patient Account Number: [id number] Number: Treating RN: Clover MealyAfful, RN, BSN, Frank 28-Sep-1956 [phone](59 y.o. Other Clinician: Date of Birth/Sex: Male) Treating Frank, Asberry Primary Care Physician: SYSTEM, PCP Physician/Extender: Frank Referring Physician: Joen LauraBLISS, LAURA Weeks in Treatment: 18 Notes Wound exam; continues to improve. Wound is smaller. Still debridement of surface slough and nonviable subcutaneous tissue however overall the area is much smaller on the plantar left foot roughly fourth metatarsal head Electronic Signature(s) Signed: 05/03/2016 4:37:54 PM By: Tyler Najjarobson, Khambrel Frank Entered By: Tyler Najjarobson, Dartanyan on 05/03/2016 14:49:58 Peters, Veverly FellsMICHAEL D. ([id number]) -------------------------------------------------------------------------------- Physician Orders Details Patient Name: [name], [name] Date of Service: 05/03/2016 1:30 PM Medical Record Patient Account Number: [id number] Number: Treating RN: Clover MealyAfful, RN, BSN, Frank 28-Sep-1956 [phone](59 y.o. Other Clinician: Date of Birth/Sex: Male) Treating Frank, Adonte Primary Care Physician: SYSTEM, PCP Physician/Extender: Frank Referring Physician: Tedra SenegalBLISS, LAURA Weeks in Treatment: 6318 Verbal / Phone Orders: Yes Clinician: Afful, RN, BSN, Frank Read Back and Verified: Yes Diagnosis Coding Wound Cleansing Wound #2 Left,Plantar Foot o Clean wound with Normal Saline. Anesthetic Wound #2 Left,Plantar  Foot o Topical Lidocaine 4% cream applied to wound bed prior to debridement Skin Barriers/Peri-Wound Care Wound #2 Left,Plantar Foot o Skin Prep Primary Wound Dressing Wound #2 Left,Plantar Foot o Aquacel Ag - or silver alginate only Secondary Dressing Wound #2 Left,Plantar Foot o Boardered Foam Dressing - netting Dressing Change Frequency Wound #2 Left,Plantar Foot o Change dressing every other day. Follow-up Appointments Wound #2 Left,Plantar Foot o Return Appointment in 1 week. Off-Loading Wound #2 Left,Plantar Foot o Open toe surgical shoe with peg assist. Nafziger, Liban D. ([id number]) Additional Orders / Instructions Wound #2 Left,Plantar Foot o Increase protein intake. o Activity as tolerated Medications-please add to medication list. Wound #2 Left,Plantar Foot o P.O. Antibiotics - Septra DS 1 po BID for 6 wks o Other: - Vitamin C, Zinc, Multivitamin Electronic Signature(s) Signed: 05/03/2016 4:37:54 PM By: Tyler Najjarobson, Granvil Frank Signed: 05/03/2016 4:58:21 PM By: [name] BSN, RN Entered By: [name] on 05/03/2016 13:55:48 Nichol, Jamelle D. ([id number]) -------------------------------------------------------------------------------- Problem List Details Patient Name: [name], [name] Date of Service: 05/03/2016 1:30 PM Medical Record Patient Account Number: [id number] Number: Treating RN: Clover MealyAfful, RN, BSN, Frank 28-Sep-1956 [phone](59 y.o. 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 ([id number]) -------------------------------------------------------------------------------- Progress Note Details Patient Name: [name], [name] Date of Service: 05/03/2016 1:30 PM Medical Record Patient Account Number: [id number] Number: Treating RN: Clover MealyAfful, RN, BSN, Frank 28-Sep-1956 [phone](59 y.o. Other Clinician: Date of Birth/Sex: Male) Treating Frank, Gurfateh Primary Care Physician: SYSTEM,  PCP Physician/Extender: Frank Referring Physician: Tedra Sen[place] in Treatment: 18 Subjective Chief Complaint Information obtained from Patient Patient is here for review of a wound on his left first metatarsal head that is been there for the last 6 months History of Present Illness (HPI) 12/28/15. This patient is a type II diabetic on insulin. He had vascular interventions 2 weeks ago on 1/17. This included an angioplasty of the left superficial femoral and popliteal arteries as well as a percutaneous transluminal angioplasty of the left posterior tibial artery. Nevertheless he is here for a plantar wound that the patient says is been there for the last 6 months. He initially told us that he is applying Neosporin to this however he tells me also that he has been in a nursing home for the last year. I found that to be a somewhat on usual combination of fax. In any case the patient is a diabetic with diabetic PAD. 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 His ABI on the left side today was 0.62 01/11/16; we have been using Prisma to the wound areas on these first metatarsal head on the left and the small wound on his left anterior ankle. He states that the area on his left metatarsal head  was not changed all week, this might account for why there is so much maceration here. In the meantime he tells me he has fallen and hurt his shoulder x-ray is negative. He is also complaining of shortness of breath 01/18/16; the area on his left anterior lower leg/ankle is resolved. The area over his first metatarsal head looks better. There is less maceration tissue here. Surgical debridement to remove surface slough and nonviable subcutaneous tissue however this generally appears better. 01/25/16; patient arrives today with several areas which are small and superficial on the left leg. This may be from scratching which he admittedly does. The area over the left first plantar metatarsal head looks improved. There is no maceration surrounding skin looks normal. There is some epithelialization attempting to get over this wound. 02/01/16 the areas on the left leg have closed down. The area over the first plantar metatarsal head on the left looks stable to improved although there is some maceration around this. Switch to silver alginate/Aquacel Ag 02/08/16; the areas on his legs remain closed down. The area over the first plantar metatarsal head on the left has advancing epithelialization with only a small open area remaining. Patient always look short of breath when he comes to our clinic but he states his wheezing is improved. We switched to silver alginate last week due to maceration around the wound 4/4the area on his leg remains closed. The area over the left first plantar metatarsal head is fully epithelialized. Patient tells me he has new shoes coming for both areas prosthetic leg on the right than the draining fluid on the left, this wound is certainly not ready for that type of force as of yet 02/22/16; the left first plantar metatarsal head has again split wide open which is exceptionally disappointing. Macerated surrounding skin debridement with subcutaneous tissue. Patient states he was  exercising on an exercise bike, this friction over this area may of reopened this. [name], [name] ([id number]) 02/29/16 left first metatarsal head wound is improved. Requires debridement, I removed some macerated surrounding skin nonviable surface slough. It appears better 03/07/16 left first metatarsal head. Using Aquacel Ag. There is advancing epithelialization but fragile 03/14/16; left first metatarsal head  presents with a non viable surface. Debridement is necessary . Subcutaneous tissue is not viable 03/22/16; left first metatarsal head again with a nonviable surface. I aggressively debridement this of nonviable subcutaneous tissue this appears to get down to a healthy base although it is deep. I did culture this wound no empiric antibiotics 03/29/16; once again he comes in with a nonviable surface and purulent drainage of this wound. With simple debridement with a curet the tissue just. This and although this does not probe to bone it is precariously close with a depth of 0.5 cm. 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 04/05/16; a fair amount of drainage which was against his skin under the foam-based dressing according to our intake nurse. continues on Septra for MRSA 04/12/16; left first plantar metatarsal head wound. MRI showed osteomyelitis involving the sesamoid bones in the wound area. He is on Septra for a deep culture isolate that was doxycycline resistant 04/19/16: Patient's wound continues to improve. Review of epic last wk showed an Ef of 35% quoted in discharge summary from 03/25/15 although I don't see where this is coming form. This would make HBO difficult would at least [facility] cardiology clearance 04/26/16; patient's wounds continued to improve. This is on the base of his remaining foot the. He has follow- up with cardiology with regards to his cardiac risk for HBO as well as if any additional procedures might be helpful in terms of his PAD 05/03/16; the patient saw  cardiology who unfortunately simply referred him back to vascular surgery. I was hoping for more of a comprehensive review about whether he might need an arteriogram, as well as whether his cardiomyopathy would preclude him from hyperbarics. In any case the patient continues to make almost surprising improvement in his wound condition Objective Constitutional Vitals Time Taken: 1:31 PM, Height: 67 in, Weight: 195 lbs, BMI: 30.5, Temperature: 98 F, Pulse: 59 bpm, Respiratory Rate: 18 breaths/min, Blood Pressure: 168/63 mmHg. Integumentary (Hair, Skin) Wound #2 status is Open. Original cause of wound was Gradually Appeared. The wound is located on the Left,Plantar Foot. The wound measures 0.3cm length x 0.3cm width x 0.1cm depth; 0.071cm^2 area and 0.007cm^3 volume. The wound is limited to skin breakdown. There is no tunneling or undermining noted. There is a small amount of serous drainage noted. The wound margin is distinct with the outline attached to the wound base. There is small (1-33%) pink granulation within the wound bed. There is a large (67-100%) amount of necrotic tissue within the wound bed including Adherent Slough. The periwound skin appearance exhibited: Maceration, Moist, Hemosiderin Staining, Mottled, Pallor. The periwound skin appearance did not exhibit: Callus, Crepitus, Excoriation, Fluctuance, Friable, Induration, Localized Edema, Rash, Scarring, Dry/Scaly, Atrophie Blanche, Cyanosis, Ecchymosis, Rubor, Erythema. Periwound temperature was noted as Cool/Cold. 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 There was a Skin/Subcutaneous Tissue Debridement ([id number]) debridement with total area of 0.09 sq cm performed by Tyler Caul, Frank. with the following instrument(s): Curette to remove Non-Viable tissue/material including Fibrin/Slough and Subcutaneous after achieving pain control using Lidocaine 4% Topical Solution. A time out was conducted  prior to the start of the procedure. A Minimum amount of bleeding was controlled with Pressure. The procedure was tolerated well with a pain level of 0 throughout and a pain level of 0 following the procedure. Post Debridement Measurements: 0.3cm length x 0.3cm width x 0.1cm depth; 0.007cm^3 volume. Post procedure Diagnosis Wound #2: Same as Pre-Procedure Plan Wound Cleansing: Wound #2 Left,Plantar Foot: Clean wound with Normal Saline. Anesthetic: Wound #2 Left,Plantar Foot: Topical Lidocaine 4% cream applied to wound bed prior to debridement Skin Barriers/Peri-Wound Care: Wound #2 Left,Plantar Foot: Skin Prep Primary Wound Dressing: Wound #2 Left,Plantar Foot: Aquacel Ag - or silver alginate only [name], Jed D. ([id number]) Secondary Dressing: Wound #2 Left,Plantar Foot: Boardered Foam Dressing - netting Dressing Change Frequency: Wound #2 Left,Plantar Foot: Change dressing every other day. Follow-up Appointments: Wound #2 Left,Plantar Foot: Return Appointment in 1 week. Off-Loading: Wound #2 Left,Plantar Foot: Open toe surgical shoe with peg assist. Additional Orders / Instructions: Wound #2 Left,Plantar Foot: Increase protein intake. Activity as tolerated Medications-please add to medication list.: Wound #2 Left,Plantar Foot: P.O. Antibiotics - Septra DS 1 po BID for 6 wks Other: - Vitamin C, Zinc, Multivitamin The patient's return to interventional cardiology was a little bit frustrating as we did not get any answers to the questions I wanted to hear about. This is  tempered by the fact that the wound on his foot actually is a lot better. We will continue with Aquacel Ag foam-based dressings changed every second day Electronic Signature(s) Signed: 05/03/2016 4:37:54 PM By: Tyler Najjar Frank Entered By: Tyler Najjar on 05/03/2016 14:50:31 Debruyne, Romaine D. ([id number]) -------------------------------------------------------------------------------- SuperBill Details Patient Name: Tyler Milo D. Date of Service: 05/03/2016 Medical Record Patient Account Number: [id number] Number: Treating RN: Clover Mealy, RN, BSN, Frank 16-Nov-1955 [phone] y.o. 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 E11.621 Type 2 diabetes mellitus with foot ulcer E11.51 Type 2 diabetes mellitus with diabetic peripheral angiopathy without gangrene Facility Procedures CPT4 Code: [id number] Description: 11042 - DEB SUBQ TISSUE 20 SQ CM/< ICD-10 Description Diagnosis E11.621 Type 2 diabetes mellitus with foot ulcer Modifier: Quantity: 1 Physician Procedures CPT4 Code: [id number] Description: 11042 - WC PHYS SUBQ TISS 20 SQ CM ICD-10 Description Diagnosis E11.621 Type 2 diabetes mellitus with foot ulcer Modifier: Quantity: 1 Electronic Signature(s) Signed: 05/03/2016 4:37:54 PM By: Tyler Najjar Frank Entered By: Tyler Najjar on 05/03/2016 14:51:15

## 2016-05-04 NOTE — Progress Notes (Signed)
Frank, Tyler ([id number]) Visit Report for 05/03/2016 Arrival Information Details Patient Name: [name], MCCLENAHAN. Date of Service: 05/03/2016 1:30 PM Medical Record Number: [id number] Patient Account Number: [id number] Date of Birth/Sex: February 13, 1956 (59 y.o. Male) Treating RN: Clover Mealy, RN, BSN, [place] Sink Primary Care Physician: SYSTEM, PCP Other Clinician: Referring Physician: Joen Laura Treating Physician/Extender: Altamese [place] in Treatment: 18 Visit Information History Since Last Visit Added or deleted any medications: No Patient Arrived: Wheel Chair Any new allergies or adverse reactions: No Arrival Time: 13:31 Had a fall or experienced change in No activities of daily living that may affect Accompanied By: self risk of falls: Transfer Assistance: None Signs or symptoms of abuse/neglect since last No Patient Identification Verified: Yes visito Secondary Verification Process Yes Hospitalized since last visit: No Completed: Has Dressing in Place as Prescribed: Yes Patient Requires Transmission-Based No Pain Present Now: No Precautions: Patient Has Alerts: No Electronic Signature(s) Signed: 05/03/2016 4:58:21 PM By: Elpidio Eric BSN, RN Entered By: Elpidio Eric on 05/03/2016 13:31:30 Rockett, Kashawn DMarland Kitchen ([id number]) -------------------------------------------------------------------------------- Encounter Discharge Information Details Patient Name: Frank [name] Date of Service: 05/03/2016 1:30 PM Medical Record Number: [id number] Patient Account Number: [id number] Date of Birth/Sex: 1956/05/05 (60 y.o. Male) Treating RN: Clover Mealy, RN, BSN, [place] Sink Primary Care Physician: SYSTEM, PCP Other Clinician: Referring Physician: Joen Laura Treating Physician/Extender: Altamese [place] in Treatment: 18 Encounter Discharge Information Items Schedule Follow-up Appointment: No Medication Reconciliation completed No and provided to Patient/Care Fortino Haag: Provided on Clinical  Summary of Care: 05/03/2016 Form Type Recipient Paper Patient [facility] Electronic Signature(s) Signed: 05/03/2016 2:01:12 PM By: Gwenlyn Perking Entered By: Gwenlyn Perking on 05/03/2016 14:01:12 Flemings, Christipher D. ([id number]) -------------------------------------------------------------------------------- Lower Extremity Assessment Details Patient Name: Frank, [name] Date of Service: 05/03/2016 1:30 PM Medical Record Number: [id number] Patient Account Number: [id number] Date of Birth/Sex: July 13, 1956 (60 y.o. Male) Treating RN: Clover Mealy, RN, BSN, [place] Sink Primary Care Physician: SYSTEM, PCP Other Clinician: Referring Physician: Joen Laura Treating Physician/Extender: Altamese [place] in Treatment: 18 Vascular Assessment Pulses: Posterior Tibial Dorsalis Pedis Palpable: [Left:Yes] Extremity colors, hair growth, and conditions: Extremity Color: [Left:Mottled] Hair Growth on Extremity: [Left:No] Temperature of Extremity: [Left:Cool] Capillary Refill: [Left:< 3 seconds] Toe Nail Assessment Left: Right: Thick: Yes Discolored: Yes Deformed: No Improper Length and Hygiene: No Electronic Signature(s) Signed: 05/03/2016 4:58:21 PM By: Elpidio Eric BSN, RN Entered By: Elpidio Eric on 05/03/2016 13:35:21 Merkle, Khambrel D. ([id number]) -------------------------------------------------------------------------------- Multi Wound Chart Details Patient Name: Frank [name] Date of Service: 05/03/2016 1:30 PM Medical Record Number: [id number] Patient Account Number: [id number] Date of Birth/Sex: Dec 11, 1955 (60 y.o. Male) Treating RN: Clover Mealy, RN, BSN,  Sink Primary Care Physician: SYSTEM, PCP Other Clinician: Referring Physician: Joen Laura Treating Physician/Extender: [name] Weeks in Treatment: 18 Vital Signs Height(in): 67 Pulse(bpm): 59 Weight(lbs): 195 Blood Pressure 168/63 (mmHg): Body Mass Index(BMI): 31 Temperature(F): 98 Respiratory Rate 18 (breaths/min): Photos: [2:No  Photos] [N/A:N/A] Wound Location: [2:Left Foot - Plantar] [N/A:N/A] Wounding Event: [2:Gradually Appeared] [N/A:N/A] Primary Etiology: [2:Diabetic Wound/Ulcer of the Lower Extremity] [N/A:N/A] Comorbid History: [2:Anemia, Chronic Obstructive Pulmonary Disease (COPD), Arrhythmia, Congestive Heart Failure, Coronary Artery Disease, Hypertension, Type II Diabetes, Neuropathy] [N/A:N/A] Date Acquired: [2:12/21/2014] [N/A:N/A] Weeks of Treatment: [2:18] [N/A:N/A] Wound Status: [2:Open] [N/A:N/A] Measurements L x W x D 0.3x0.3x0.1 [N/A:N/A] (cm) Area (cm) : [2:0.071] [N/A:N/A] Volume (cm) : [2:0.007] [N/A:N/A] % Reduction in Area: [2:91.00%] [N/A:N/A] % Reduction in Volume: 97.00% [N/A:N/A] Classification: [2:Grade 1] [N/A:N/A] Exudate Amount: [2:Small] [N/A:N/A] Exudate Type: [2:Serous] [N/A:N/A] Exudate Color: [2:amber] [N/A:N/A] Wound  Margin: [2:Distinct, outline attached] [N/A:N/A] Granulation Amount: [2:Small (1-33%)] [N/A:N/A] Granulation Quality: [2:Pink] [N/A:N/A] Necrotic Amount: [2:Large (67-100%)] [N/A:N/A] Exposed Structures: Fascia: No N/A N/A Fat: No Tendon: No Muscle: No Joint: No Bone: No Limited to Skin Breakdown Epithelialization: Medium (34-66%) N/A N/A Periwound Skin Texture: Edema: No N/A N/A Excoriation: No Induration: No Callus: No Crepitus: No Fluctuance: No Friable: No Rash: No Scarring: No Periwound Skin Maceration: Yes N/A N/A Moisture: Moist: Yes Dry/Scaly: No Periwound Skin Color: Hemosiderin Staining: Yes N/A N/A Mottled: Yes Pallor: Yes Atrophie Blanche: No Cyanosis: No Ecchymosis: No Erythema: No Rubor: No Temperature: Cool/Cold N/A N/A Tenderness on No N/A N/A Palpation: Wound Preparation: Ulcer Cleansing: N/A N/A Rinsed/Irrigated with Saline Topical Anesthetic Applied: Other: lidocaine 4% Treatment Notes Electronic Signature(s) Signed: 05/03/2016 4:58:21 PM By: Elpidio EricAfful, Rita BSN, RN Entered By: Elpidio EricAfful, Rita on 05/03/2016  13:53:13 Hinger, Veverly FellsMICHAEL D. ([id number]) -------------------------------------------------------------------------------- Multi-Disciplinary Care Plan Details Patient Name: Frank MiloHAM, Uri D. Date of Service: 05/03/2016 1:30 PM Medical Record Number: [id number] Patient Account Number: [id number] Date of Birth/Sex: [birth year](59 y.o. Male) Treating RN: Clover MealyAfful, RN, BSN, [place] Sinkita Primary Care Physician: SYSTEM, PCP Other Clinician: Referring Physician: Joen LauraBLISS, LAURA Treating Physician/Extender: Altamese CarolinaOBSON, Martez G Weeks in Treatment: 18 Active Inactive Abuse / Safety / Falls / Self Care Management Nursing Diagnoses: Impaired home maintenance Impaired physical mobility Knowledge deficit related to: safety; personal, health (wound), emergency Potential for falls Self care deficit: actual or potential Goals: Patient will remain injury free Date Initiated: 12/28/2015 Goal Status: Active Patient/caregiver will verbalize understanding of skin care regimen Date Initiated: 12/28/2015 Goal Status: Active Patient/caregiver will verbalize/demonstrate measure taken to improve self care Date Initiated: 12/28/2015 Goal Status: Active Patient/caregiver will verbalize/demonstrate measures taken to improve the patient's personal safety Date Initiated: 12/28/2015 Goal Status: Active Patient/caregiver will verbalize/demonstrate measures taken to prevent injury and/or falls Date Initiated: 12/28/2015 Goal Status: Active Patient/caregiver will verbalize/demonstrate understanding of what to do in case of emergency Date Initiated: 12/28/2015 Goal Status: Active Interventions: Assess fall risk on admission and as needed Assess: immobility, friction, shearing, incontinence upon admission and as needed Assess impairment of mobility on admission and as needed per policy Assess self care needs on admission and as needed Provide education on basic hygiene Diamond, Veverly FellsMICHAEL D. ([id number]) Provide education on fall  prevention Provide education on personal and home safety Provide education on safe transfers Treatment Activities: Education provided on Basic Hygiene : 04/26/2016 Notes: Orientation to the Wound Care Program Nursing Diagnoses: Knowledge deficit related to the wound healing center program Goals: Patient/caregiver will verbalize understanding of the Wound Healing Center Program Date Initiated: 12/28/2015 Goal Status: Active Interventions: Provide education on orientation to the wound center Notes: Venous Leg Ulcer Nursing Diagnoses: Knowledge deficit related to disease process and management Potential for venous Insuffiency (use before diagnosis confirmed) Goals: Non-invasive venous studies are completed as ordered Date Initiated: 12/28/2015 Goal Status: Active Patient/caregiver will verbalize understanding of disease process and disease management Date Initiated: 12/28/2015 Goal Status: Active Verify adequate tissue perfusion prior to therapeutic compression application Date Initiated: 12/28/2015 Goal Status: Active Interventions: Assess peripheral edema status every visit. Provide education on venous insufficiency Notes: Spittler, Elimelech D. ([id number]) Wound/Skin Impairment Nursing Diagnoses: Impaired tissue integrity Knowledge deficit related to smoking impact on wound healing Knowledge deficit related to ulceration/compromised skin integrity Goals: Patient/caregiver will verbalize understanding of skin care regimen Date Initiated: 12/28/2015 Goal Status: Active Ulcer/skin breakdown will have a volume reduction of 30% by week 4 Date Initiated: 12/28/2015 Goal Status: Active Ulcer/skin breakdown will have  a volume reduction of 50% by week 8 Date Initiated: 12/28/2015 Goal Status: Active Ulcer/skin breakdown will have a volume reduction of 80% by week 12 Date Initiated: 12/28/2015 Goal Status: Active Ulcer/skin breakdown will heal within 14 weeks Date Initiated:  12/28/2015 Goal Status: Active Interventions: Assess patient/caregiver ability to obtain necessary supplies Assess patient/caregiver ability to perform ulcer/skin care regimen upon admission and as needed Assess ulceration(s) every visit Provide education on ulcer and skin care Notes: Electronic Signature(s) Signed: 05/03/2016 4:58:21 PM By: Elpidio EricAfful, Rita BSN, RN Entered By: Elpidio EricAfful, Rita on 05/03/2016 13:53:06 [place]ington, Lochlan D. ([id number]) -------------------------------------------------------------------------------- Pain Assessment Details Patient Name: Frank MiloHAM, Kalief D. Date of Service: 05/03/2016 1:30 PM Medical Record Number: [id number] Patient Account Number: [id number] Date of Birth/Sex: 1956-06-04 4(59 y.o. Male) Treating RN: Clover MealyAfful, RN, BSN, [place] Sinkita Primary Care Physician: SYSTEM, PCP Other Clinician: Referring Physician: Joen LauraBLISS, LAURA Treating Physician/Extender: Altamese CarolinaOBSON, Kojo G Weeks in Treatment: 18 Active Problems Location of Pain Severity and Description of Pain Patient Has Paino No Site Locations With Dressing Change: No Pain Management and Medication Current Pain Management: Electronic Signature(s) Signed: 05/03/2016 4:58:21 PM By: Elpidio EricAfful, Rita BSN, RN Entered By: Elpidio EricAfful, Rita on 05/03/2016 13:31:36 Delph, Veverly FellsMICHAEL D. ([id number]) -------------------------------------------------------------------------------- Patient/Caregiver Education Details Patient Name: Frank MiloHAM, Kysen D. Date of Service: 05/03/2016 1:30 PM Medical Record Number: [id number] Patient Account Number: [id number] Date of Birth/Gender: 1956-06-04 [age] y.o. Male) Treating RN: Clover MealyAfful, RN, BSN, [place] Sinkita Primary Care Physician: SYSTEM, PCP Other Clinician: Referring Physician: Joen LauraBLISS, LAURA Treating Physician/Extender: Altamese CarolinaOBSON, Adiel G Weeks in Treatment: 18 Education Assessment Education Provided To: Patient Education Topics Provided Basic Hygiene: Methods: Explain/Verbal Responses: State content  correctly Safety: Methods: Explain/Verbal Responses: State content correctly Venous: Methods: Explain/Verbal Responses: State content correctly Welcome To The Wound Care Center: Methods: Explain/Verbal Responses: State content correctly Wound/Skin Impairment: Methods: Explain/Verbal Responses: State content correctly Electronic Signature(s) Signed: 05/03/2016 4:58:21 PM By: Elpidio EricAfful, Rita BSN, RN Entered By: Elpidio EricAfful, Rita on 05/03/2016 14:01:49 Degante, Kallan D. ([id number]) -------------------------------------------------------------------------------- Wound Assessment Details Patient Name: Hillmann, Reginaldo D. Date of Service: 05/03/2016 1:30 PM Medical Record Number: [id number] Patient Account Number: [id number] Date of Birth/Sex: 1956-06-04 [age] y.o. Male) Treating RN: Clover MealyAfful, RN, BSN, Rita Primary Care Physician: SYSTEM, PCP Other Clinician: Referring Physician: Joen LauraBLISS, LAURA Treating Physician/Extender: Maxwell CaulOBSON, Kaleo G Weeks in Treatment: 18 Wound Status Wound Number: 2 Primary Diabetic Wound/Ulcer of the Lower Etiology: Extremity Wound Location: Left Foot - Plantar Wound Open Wounding Event: Gradually Appeared Status: Date Acquired: 12/21/2014 Comorbid Anemia, Chronic Obstructive Pulmonary Weeks Of Treatment: 18 History: Disease (COPD), Arrhythmia, Clustered Wound: No Congestive Heart Failure, Coronary Artery Disease, Hypertension, Type II Diabetes, Neuropathy Photos Photo Uploaded By: Elpidio EricAfful, Rita on 05/03/2016 16:54:26 Wound Measurements Length: (cm) 0.3 Width: (cm) 0.3 Depth: (cm) 0.1 Area: (cm) 0.071 Volume: (cm) 0.007 % Reduction in Area: 91% % Reduction in Volume: 97% Epithelialization: Medium (34-66%) Tunneling: No Undermining: No Wound Description Classification: Grade 1 Wound Margin: Distinct, outline attached Pickrell, Gayle D. ([id number]) Foul Odor After Cleansing: No Exudate Amount: Small Exudate Type: Serous Exudate Color: amber Wound Bed Granulation  Amount: Small (1-33%) Exposed Structure Granulation Quality: Pink Fascia Exposed: No Necrotic Amount: Large (67-100%) Fat Layer Exposed: No Necrotic Quality: Adherent Slough Tendon Exposed: No Muscle Exposed: No Joint Exposed: No Bone Exposed: No Limited to Skin Breakdown Periwound Skin Texture Texture Color No Abnormalities Noted: No No Abnormalities Noted: No Callus: No Atrophie Blanche: No Crepitus: No Cyanosis: No Excoriation: No Ecchymosis: No Fluctuance: No Erythema: No Friable: No Hemosiderin Staining: Yes Induration: No Mottled:  Yes Localized Edema: No Pallor: Yes Rash: No Rubor: No Scarring: No Temperature / Pain Moisture Temperature: Cool/Cold No Abnormalities Noted: No Dry / Scaly: No Maceration: Yes Moist: Yes Wound Preparation Ulcer Cleansing: Rinsed/Irrigated with Saline Topical Anesthetic Applied: Other: lidocaine 4%, Treatment Notes Wound #2 (Left, Plantar Foot) 1. Cleansed with: Clean wound with Normal Saline 4. Dressing Applied: Aquacel Ag 5. Secondary Dressing Applied Bordered Foam Dressing 6. Footwear/Offloading device applied Other footwear/offloading device applied (specify in notes) Wuertz, Khyson D. ([id number]) Notes netting and [place] Electronic Signature(s) Signed: 05/03/2016 4:58:21 PM By: Elpidio Eric BSN, RN Entered By: Elpidio Eric on 05/03/2016 13:38:08 Bredeson, Yaqub D. ([id number]) -------------------------------------------------------------------------------- Vitals Details Patient Name: Frank [name] Date of Service: 05/03/2016 1:30 PM Medical Record Number: [id number] Patient Account Number: [id number] Date of Birth/Sex: August 15, 1956 (60 y.o. Male) Treating RN: Afful, RN, BSN, Rita Primary Care Physician: SYSTEM, PCP Other Clinician: Referring Physician: Joen Laura Treating Physician/Extender: Altamese [place] in Treatment: 18 Vital Signs Time Taken: 13:31 Temperature (F): 98 Height (in): 67 Pulse (bpm):  59 Weight (lbs): 195 Respiratory Rate (breaths/min): 18 Body Mass Index (BMI): 30.5 Blood Pressure (mmHg): 168/63 Reference Range: 80 - 120 mg / dl Electronic Signature(s) Signed: 05/03/2016 4:58:21 PM By: Elpidio Eric BSN, RN Entered By: Elpidio Eric on 05/03/2016 13:35:00

## 2016-05-10 ENCOUNTER — Encounter: Payer: Medicare Other | Admitting: Internal Medicine

## 2016-05-10 DIAGNOSIS — E11621 Type 2 diabetes mellitus with foot ulcer: Secondary | ICD-10-CM | POA: Diagnosis not present

## 2016-05-11 NOTE — Progress Notes (Signed)
JOSEH, SJOGREN ([id number]) Visit Report for 05/10/2016 Arrival Information Details Patient Name: [name], [name]. Date of Service: 05/10/2016 2:15 PM Medical Record Number: [id number] Patient Account Number: [id number] Date of Birth/Sex: [birth year] (60 y.o. Male) Treating RN: Clover Mealy, RN, BSN, [place] Sink Primary Care Physician: SYSTEM, PCP Other Clinician: Referring Physician: Joen Laura Treating Physician/Extender: Altamese [place] in Treatment: 19 Visit Information History Since Last Visit Added or deleted any medications: No Patient Arrived: Wheel Chair Any new allergies or adverse reactions: No Arrival Time: 14:29 Had a fall or experienced change in No activities of daily living that may affect Accompanied By: self risk of falls: Transfer Assistance: None Signs or symptoms of abuse/neglect No Patient Identification Verified: Yes since last visito Secondary Verification Process Yes Hospitalized since last visit: No Completed: Has Dressing in Place as Prescribed: Yes Patient Requires Transmission-Based No Has Footwear/Offloading in Place as Yes Precautions: Prescribed: Patient Has Alerts: No Left: Surgical Shoe with Pressure Relief Insole Pain Present Now: No Electronic Signature(s) Signed: 05/10/2016 5:43:53 PM By: Elpidio Eric BSN, RN Entered By: Elpidio Eric on 05/10/2016 14:32:00 Langsam, Faruq D. ([id number]) -------------------------------------------------------------------------------- Clinic Level of Care Assessment Details Patient Name: Tyler Milo D. Date of Service: 05/10/2016 2:15 PM Medical Record Number: [id number] Patient Account Number: [id number] Date of Birth/Sex: Apr 24, 1956 (60 y.o. Male) Treating RN: Clover Mealy, RN, BSN, Rita Primary Care Physician: SYSTEM, PCP Other Clinician: Referring Physician: Joen Laura Treating Physician/Extender: Altamese [place] in Treatment: 19 Clinic Level of Care Assessment Items TOOL 4 Quantity Score  - Use when  only an EandM is performed on FOLLOW-UP visit 0 ASSESSMENTS - Nursing Assessment / Reassessment X - Reassessment of Co-morbidities (includes updates in patient status) 1 10 X - Reassessment of Adherence to Treatment Plan 1 5 ASSESSMENTS - Wound and Skin Assessment / Reassessment  - Simple Wound Assessment / Reassessment - one wound 0  - Complex Wound Assessment / Reassessment - multiple wounds 0  - Dermatologic / Skin Assessment (not related to wound area) 0 ASSESSMENTS - Focused Assessment  - Circumferential Edema Measurements - multi extremities 0  - Nutritional Assessment / Counseling / Intervention 0 X - Lower Extremity Assessment (monofilament, tuning fork, pulses) 1 5  - Peripheral Arterial Disease Assessment (using hand held doppler) 0 ASSESSMENTS - Ostomy and/or Continence Assessment and Care  - Incontinence Assessment and Management 0  - Ostomy Care Assessment and Management (repouching, etc.) 0 PROCESS - Coordination of Care X - Simple Patient / Family Education for ongoing care 1 15  - Complex (extensive) Patient / Family Education for ongoing care 0  - Staff obtains Chiropractor, Records, Test Results / Process Orders 0  - Staff telephones HHA, Nursing Homes / Clarify orders / etc 0 X - Routine Transfer to another Facility (non-emergent condition) 1 10 Tyler, Nicholai D. ([id number])  - Routine Hospital Admission (non-emergent condition) 0  - New Admissions / Manufacturing engineer / Ordering NPWT, Apligraf, etc. 0  - Emergency Hospital Admission (emergent condition) 0  - Simple Discharge Coordination 0  - Complex (extensive) Discharge Coordination 0 PROCESS - Special Needs  - Pediatric / Minor Patient Management 0  - Isolation Patient Management 0  - Hearing / Language / Visual special needs 0  - Assessment of Community assistance (transportation, D/C planning, etc.) 0  - Additional assistance / Altered mentation 0  - Support  Surface(s) Assessment (bed, cushion, seat, etc.) 0 INTERVENTIONS - Wound Cleansing / Measurement  - Simple Wound Cleansing - one wound 0  -  Complex Wound Cleansing - multiple wounds 0 []  - Wound Imaging (photographs - any number of wounds) 0 []  - Wound Tracing (instead of photographs) 0 []  - Simple Wound Measurement - one wound 0 []  - Complex Wound Measurement - multiple wounds 0 INTERVENTIONS - Wound Dressings []  - Small Wound Dressing one or multiple wounds 0 []  - Medium Wound Dressing one or multiple wounds 0 []  - Large Wound Dressing one or multiple wounds 0 []  - Application of Medications - topical 0 []  - Application of Medications - injection 0 INTERVENTIONS - Miscellaneous []  - External ear exam 0 Tyler, Calder D. ([id number]) []  - Specimen Collection (cultures, biopsies, blood, body fluids, etc.) 0 []  - Specimen(s) / Culture(s) sent or taken to Lab for analysis 0 []  - Patient Transfer (multiple staff / Michiel SitesHoyer Lift / Similar devices) 0 []  - Simple Staple / Suture removal (25 or less) 0 []  - Complex Staple / Suture removal (26 or more) 0 []  - Hypo / Hyperglycemic Management (close monitor of Blood Glucose) 0 []  - Ankle / Brachial Index (ABI) - do not check if billed separately 0 X - Vital Signs 1 5 Has the patient been seen at the hospital within the last three years: Yes Total Score: 50 Level Of Care: New/Established - Level 2 Electronic Signature(s) Signed: 05/10/2016 5:43:53 PM By: Elpidio EricAfful, Rita BSN, RN Entered By: Elpidio EricAfful, Rita on 05/10/2016 14:49:16 Funk, Chasten D. ([id number]) -------------------------------------------------------------------------------- Encounter Discharge Information Details Patient Name: Tyler MiloHAM, Cassady D. Date of Service: 05/10/2016 2:15 PM Medical Record Number: [id number] Patient Account Number: [id number] Date of Birth/Sex: 12/30/1955 [age] y.o. Male) Treating RN: Clover MealyAfful, RN, BSN, [place] Sinkita Primary Care Physician: SYSTEM, PCP Other Clinician: Referring  Physician: Joen LauraBLISS, LAURA Treating Physician/Extender: Altamese CarolinaOBSON, Christen G Weeks in Treatment: 19 Encounter Discharge Information Items Discharge Pain Level: 0 Discharge Condition: Stable Ambulatory Status: Wheelchair Discharge Destination: Nursing Home Transportation: Other Accompanied By: self Schedule Follow-up Appointment: No Medication Reconciliation completed and provided to Patient/Care No Deondray Ospina: Provided on Clinical Summary of Care: 05/10/2016 Form Type Recipient Paper [facility] Signature(s) Signed: 05/10/2016 2:50:28 PM By: Gwenlyn PerkingMoore, Shelia Entered By: Gwenlyn PerkingMoore, Shelia on 05/10/2016 14:50:28 Hardigree, Jostin D. ([id number]) -------------------------------------------------------------------------------- Lower Extremity Assessment Details Patient Name: Fiorenza, Brydon D. Date of Service: 05/10/2016 2:15 PM Medical Record Number: [id number] Patient Account Number: [id number] Date of Birth/Sex: 12/30/1955 [age] y.o. Male) Treating RN: Clover MealyAfful, RN, BSN, [place] Sinkita Primary Care Physician: SYSTEM, PCP Other Clinician: Referring Physician: Joen LauraBLISS, LAURA Treating Physician/Extender: Altamese CarolinaOBSON, Cartrell G Weeks in Treatment: 19 Vascular Assessment Pulses: Posterior Tibial Dorsalis Pedis Palpable: [Left:Yes] Extremity colors, hair growth, and conditions: Extremity Color: [Left:Mottled] Hair Growth on Extremity: [Left:No] Temperature of Extremity: [Left:Cool] Capillary Refill: [Left:< 3 seconds] Toe Nail Assessment Left: Right: Thick: Yes Discolored: Yes Deformed: No Improper Length and Hygiene: No Electronic Signature(s) Signed: 05/10/2016 5:43:53 PM By: Elpidio EricAfful, Rita BSN, RN Entered By: Elpidio EricAfful, Rita on 05/10/2016 14:32:36 Torrico, Corley D. ([id number]) -------------------------------------------------------------------------------- Multi Wound Chart Details Patient Name: Tyler MiloHAM, Baruc D. Date of Service: 05/10/2016 2:15 PM Medical Record Number: [id number] Patient Account Number:  [id number] Date of Birth/Sex: 12/30/1955 [age] y.o. Male) Treating RN: Clover MealyAfful, RN, BSN, [place] Sinkita Primary Care Physician: SYSTEM, PCP Other Clinician: Referring Physician: Joen LauraBLISS, LAURA Treating Physician/Extender: Maxwell CaulOBSON, Yves G Weeks in Treatment: 19 Vital Signs Height(in): 67 Pulse(bpm): 60 Weight(lbs): 195 Blood Pressure 142/55 (mmHg): Body Mass Index(BMI): 31 Temperature(F): 98.1 Respiratory Rate 19 (breaths/min): Photos: [2:No Photos] [N/A:N/A] Wound Location: [2:Left Foot - Plantar] [N/A:N/A] Wounding Event: [2:Gradually Appeared] [N/A:N/A] Primary Etiology: [2:Diabetic Wound/Ulcer of  the Lower Extremity] [N/A:N/A] Comorbid History: [2:Anemia, Chronic Obstructive Pulmonary Disease (COPD), Arrhythmia, Congestive Heart Failure, Coronary Artery Disease, Hypertension, Type II Diabetes, Neuropathy] [N/A:N/A] Date Acquired: [2:12/21/2014] [N/A:N/A] Weeks of Treatment: [2:19] [N/A:N/A] Wound Status: [2:Open] [N/A:N/A] Measurements L x W x D 0.1x0.1x0.1 [N/A:N/A] (cm) Area (cm) : [2:0.008] [N/A:N/A] Volume (cm) : [2:0.001] [N/A:N/A] % Reduction in Area: [2:99.00%] [N/A:N/A] % Reduction in Volume: 99.60% [N/A:N/A] Classification: [2:Grade 1] [N/A:N/A] Exudate Amount: [2:None Present] [N/A:N/A] Wound Margin: [2:Distinct, outline attached] [N/A:N/A] Granulation Amount: [2:None Present (0%)] [N/A:N/A] Necrotic Amount: [2:None Present (0%)] [N/A:N/A] Exposed Structures: [2:Fascia: No Fat: No Tendon: No] [N/A:N/A] Muscle: No Joint: No Bone: No Limited to Skin Breakdown Epithelialization: Large (67-100%) N/A N/A Periwound Skin Texture: Edema: No N/A N/A Excoriation: No Induration: No Callus: No Crepitus: No Fluctuance: No Friable: No Rash: No Scarring: No Periwound Skin Dry/Scaly: Yes N/A N/A Moisture: Maceration: No Moist: No Periwound Skin Color: Hemosiderin Staining: Yes N/A N/A Mottled: Yes Pallor: Yes Atrophie Blanche: No Cyanosis: No Ecchymosis: No Erythema:  No Rubor: No Temperature: Cool/Cold N/A N/A Tenderness on No N/A N/A Palpation: Wound Preparation: Ulcer Cleansing: N/A N/A Rinsed/Irrigated with Saline Topical Anesthetic Applied: None Treatment Notes Electronic Signature(s) Signed: 05/10/2016 5:43:53 PM By: Elpidio Eric BSN, RN Entered By: Elpidio Eric on 05/10/2016 14:43:20 Kimble, [name] ([id number]) -------------------------------------------------------------------------------- Multi-Disciplinary Care Plan Details Patient Name: Tyler Milo D. Date of Service: 05/10/2016 2:15 PM Medical Record Number: [id number] Patient Account Number: [id number] Date of Birth/Sex: Apr 10, 1956 (60 y.o. Male) Treating RN: Clover Mealy, RN, BSN, [place] Sink Primary Care Physician: SYSTEM, PCP Other Clinician: Referring Physician: Joen Laura Treating Physician/Extender: [name] Weeks in Treatment: 19 Active Inactive Electronic Signature(s) Signed: 05/10/2016 5:43:53 PM By: Elpidio Eric BSN, RN Entered By: Elpidio Eric on 05/10/2016 14:41:34 Pettinger, Loi D. ([id number]) -------------------------------------------------------------------------------- Pain Assessment Details Patient Name: Tyler Milo D. Date of Service: 05/10/2016 2:15 PM Medical Record Number: [id number] Patient Account Number: [id number] Date of Birth/Sex: 05-Jul-1956 (60 y.o. Male) Treating RN: Clover Mealy, RN, BSN, [place] Sink Primary Care Physician: SYSTEM, PCP Other Clinician: Referring Physician: Joen Laura Treating Physician/Extender: Altamese [place] in Treatment: 19 Active Problems Location of Pain Severity and Description of Pain Patient Has Paino No Site Locations With Dressing Change: No Pain Management and Medication Current Pain Management: Electronic Signature(s) Signed: 05/10/2016 5:43:53 PM By: Elpidio Eric BSN, RN Entered By: Elpidio Eric on 05/10/2016 14:32:07 Letourneau, [name]  ([id number]) -------------------------------------------------------------------------------- Patient/Caregiver Education Details Patient Name: Tyler Milo D. Date of Service: 05/10/2016 2:15 PM Medical Record Number: [id number] Patient Account Number: [id number] Date of Birth/Gender: 1956/05/24 (60 y.o. Male) Treating RN: Clover Mealy, RN, BSN, [place] Sink Primary Care Physician: SYSTEM, PCP Other Clinician: Referring Physician: Joen Laura Treating Physician/Extender: Altamese [place] in Treatment: 81 Education Assessment Education Provided To: Patient Education Topics Provided Offloading: Methods: Explain/Verbal Responses: State content correctly Electronic Signature(s) Signed: 05/10/2016 5:43:53 PM By: Elpidio Eric BSN, RN Entered By: Elpidio Eric on 05/10/2016 14:50:12 Valli, Maleak D. ([id number]) -------------------------------------------------------------------------------- Wound Assessment Details Patient Name: Tyler Milo D. Date of Service: 05/10/2016 2:15 PM Medical Record Number: [id number] Patient Account Number: [id number] Date of Birth/Sex: 10/12/1956 (60 y.o. Male) Treating RN: Clover Mealy, RN, BSN, [place] Sink Primary Care Physician: SYSTEM, PCP Other Clinician: Referring Physician: Joen Laura Treating Physician/Extender: [name] Weeks in Treatment: 19 Wound Status Wound Number: 2 Primary Diabetic Wound/Ulcer of the Lower Etiology: Extremity Wound Location: Left, Plantar Foot Wound Healed - Epithelialized Wounding Event: Gradually Appeared Status: Date Acquired: 12/21/2014 Comorbid Anemia, Chronic Obstructive Pulmonary Weeks Of Treatment:  19 History: Disease (COPD), Arrhythmia, Clustered Wound: No Congestive Heart Failure, Coronary Artery Disease, Hypertension, Type II Diabetes, Neuropathy Photos Photo Uploaded By: Elpidio EricAfful, Rita on 05/10/2016 17:28:40 Wound Measurements Length: (cm) 0 % Reductio Width: (cm) 0 % Reductio Depth: (cm) 0 Epithelial Area: (cm) 0  Tunneling Volume: (cm) 0 Undermini n in Area: 100% n in Volume: 100% ization: Large (67-100%) : No ng: No Wound Description Classification: Grade 1 Wound Margin: Distinct, outline attached Callas, Audry D. ([id number]) Foul Odor After Cleansing: No Exudate Amount: None Present Wound Bed Granulation Amount: None Present (0%) Exposed Structure Necrotic Amount: None Present (0%) Fascia Exposed: No Fat Layer Exposed: No Tendon Exposed: No Muscle Exposed: No Joint Exposed: No Bone Exposed: No Limited to Skin Breakdown Periwound Skin Texture Texture Color No Abnormalities Noted: No No Abnormalities Noted: No Callus: No Atrophie Blanche: No Crepitus: No Cyanosis: No Excoriation: No Ecchymosis: No Fluctuance: No Erythema: No Friable: No Hemosiderin Staining: Yes Induration: No Mottled: Yes Localized Edema: No Pallor: Yes Rash: No Rubor: No Scarring: No Temperature / Pain Moisture Temperature: Cool/Cold No Abnormalities Noted: No Dry / Scaly: Yes Maceration: No Moist: No Wound Preparation Ulcer Cleansing: Rinsed/Irrigated with Saline Topical Anesthetic Applied: None Electronic Signature(s) Signed: 05/10/2016 5:43:53 PM By: Elpidio EricAfful, Rita BSN, RN Entered By: Elpidio EricAfful, Rita on 05/10/2016 14:45:08 Sonnenberg, Zebulun D. ([id number]) -------------------------------------------------------------------------------- Vitals Details Patient Name: Tyler MiloHAM, Argyle D. Date of Service: 05/10/2016 2:15 PM Medical Record Number: [id number] Patient Account Number: [id number] Date of Birth/Sex: 10-Jun-1956 [age] y.o. Male) Treating RN: Afful, RN, BSN, Rita Primary Care Physician: SYSTEM, PCP Other Clinician: Referring Physician: Joen LauraBLISS, LAURA Treating Physician/Extender: Altamese CarolinaOBSON, Dari[place] G Weeks in Treatment: 19 Vital Signs Time Taken: 14:35 Temperature (F): 98.1 Height (in): 67 Pulse (bpm): 60 Weight (lbs): 195 Respiratory Rate (breaths/min): 19 Body Mass Index (BMI): 30.5 Blood Pressure  (mmHg): 142/55 Reference Range: 80 - 120 mg / dl Electronic Signature(s) Signed: 05/10/2016 5:43:53 PM By: Elpidio EricAfful, Rita BSN, RN Entered By: Elpidio EricAfful, Rita on 05/10/2016 14:35:26

## 2016-05-12 ENCOUNTER — Ambulatory Visit (INDEPENDENT_AMBULATORY_CARE_PROVIDER_SITE_OTHER): Payer: Medicare Other

## 2016-05-12 ENCOUNTER — Other Ambulatory Visit: Payer: Self-pay

## 2016-05-12 DIAGNOSIS — R0600 Dyspnea, unspecified: Secondary | ICD-10-CM

## 2016-05-12 NOTE — Progress Notes (Signed)
Flora LippsHAM, Oumar D. ([id number]) Visit Report for 05/10/2016 Chief Complaint Document Details Patient Name: Flora LippsHAM, Jakeim D. Date of Service: 05/10/2016 2:15 PM Medical Record Patient Account Number: [id number] Number: Treating RN: Clover MealyAfful, RN, BSN, Rita [birth year] [phone](60 y.o. Other Clinician: Date of Birth/Sex: Male) Treating Justus Droke, Dmani Primary Care Physician: SYSTEM, PCP Physician/Extender: G Referring Physician: Tedra SenegalBLISS, LAURA Weeks in Treatment: 6819 Information Obtained from: Patient Chief Complaint Patient is here for review of a wound on his left first metatarsal head that is been there for the last 6 months Electronic Signature(s) Signed: 05/10/2016 4:24:59 PM By: Baltazar Najjarobson, Saivon MD Entered By: Baltazar Najjarobson, Yitzchok on 05/10/2016 15:06:59 Fazzino, Julious D. ([id number]) -------------------------------------------------------------------------------- HPI Details Patient Name: [name], Ajit D. Date of Service: 05/10/2016 2:15 PM Medical Record Patient Account Number: [id number] Number: Treating RN: Clover MealyAfful, RN, BSN, Rita [birth year] [phone](60 y.o. Other Clinician: Date of Birth/Sex: Male) Treating Liesl Simons, Makel Primary Care Physician: SYSTEM, PCP Physician/Extender: G Referring Physician: Tedra SenegalBLISS, LAURA Weeks in Treatment: 19 History of Present Illness HPI Description: 12/28/15. This patient is a type II diabetic on insulin. He had vascular interventions 2 weeks ago on 1/17. This included an angioplasty of the left superficial femoral and popliteal arteries as well as a percutaneous transluminal angioplasty of the left posterior tibial artery. Nevertheless he is here for a plantar wound that the patient says is been there for the last 6 months. He initially told us that he is applying Neosporin to this however he tells me also that he has been in a nursing home for the last year. I found that to be a somewhat on usual combination of fax. In any case the patient is a diabetic with  diabetic PAD. 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 His ABI on the left side today was 0.62 01/11/16; we have been using Prisma to the wound areas on the first metatarsal head on the left and the small wound on his left anterior ankle. He states that the area on his left metatarsal head was not changed all week, this might account for why there is so much maceration here. In the meantime he tells me he has fallen and hurt his shoulder x-ray is negative. He is also complaining of shortness of breath 01/18/16; the area on his left anterior lower leg/ankle is resolved. The area over his first metatarsal head looks better. There is less maceration tissue here. Surgical debridement to remove surface slough and nonviable subcutaneous tissue however this generally appears better. 01/25/16; patient arrives today with several areas which are small and superficial on the left leg. This may be from scratching which he admittedly does. The area over the left first plantar metatarsal head looks improved. There is no maceration surrounding skin looks normal. There is some epithelialization attempting to get over this wound. 02/01/16 the areas on the left leg have closed down. The area over the first plantar metatarsal head on the left looks stable to improved although there is some maceration around this. Switch to silver alginate/Aquacel Ag 02/08/16; the areas on his legs remain closed down. The area over the first plantar metatarsal head on the left has advancing epithelialization with only a small open area remaining. Patient always look short of breath when he comes to our clinic but he states his wheezing is improved. We switched to silver alginate last week due to maceration around the wound 4/4the area on his leg remains closed. The area over the left first plantar  metatarsal head is fully epithelialized. Patient tells me he has new shoes coming for both areas  prosthetic leg on the right than the draining fluid on the left, this wound is certainly not ready for that type of force as of yet 02/22/16; the left first plantar metatarsal head has again split wide open which is exceptionally disappointing. Macerated surrounding skin debridement with subcutaneous tissue. Patient states he was exercising on an exercise bike, this friction over this area may of reopened this. 02/29/16 left first metatarsal head wound is improved. Requires debridement, I removed some macerated surrounding skin nonviable surface slough. It appears better 03/07/16 left first metatarsal head. Using Aquacel Ag. There is advancing epithelialization but fragile 03/14/16; left first metatarsal head presents with a non viable surface. Debridement is necessary . Subcutaneous tissue is not viable Andrada, Maris D. ([id number]) 03/22/16; left first metatarsal head again with a nonviable surface. I aggressively debridement this of nonviable subcutaneous tissue this appears to get down to a healthy base although it is deep. I did culture this wound no empiric antibiotics 03/29/16; once again he comes in with a nonviable surface and purulent drainage of this wound. With simple debridement with a curet the tissue just. This and although this does not probe to bone it is precariously close with a depth of 0.5 cm. 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 04/05/16; a fair amount of drainage which was against his skin under the foam-based dressing according to our intake nurse. continues on Septra for MRSA 04/12/16; left first plantar metatarsal head wound. MRI showed osteomyelitis involving the sesamoid bones in the wound area. He is on Septra for a deep culture isolate that was doxycycline resistant 04/19/16: Patient's wound continues to improve. Review of epic last wk showed an Ef of 35% quoted in discharge summary from 03/25/15 although I don't see where this is coming form. This would make  HBO difficult would at least [facility] cardiology clearance 04/26/16; patient's wounds continued to improve. This is on the base of his remaining foot the. He has follow- up with cardiology with regards to his cardiac risk for HBO as well as if any additional procedures might be helpful in terms of his PAD 05/03/16; the patient saw cardiology who unfortunately simply referred him back to vascular surgery. I was hoping for more of a comprehensive review about whether he might need an arteriogram, as well as whether his cardiomyopathy would preclude him from hyperbarics. In any case the patient continues to make almost surprising improvement in his wound condition 05/10/16 the patient's wound is fully epithelialized. There is no open area here. He is still on antibiotics for the underlying osteomyelitis [Septra]. I had sent him to see cardiology with regards to his ischemic cardiomyopathy and ejection fraction of 35% with regards to his feasibility to do hyperbaric oxygen. If this wound reopens in short order I will revisit this. He is supposed to follow with aliments vein and vascular with regards to his ongoing PAD Electronic Signature(s) Signed: 05/10/2016 4:24:59 PM By: Baltazar Najjar MD Entered By: Baltazar Najjar on 05/10/2016 15:12:51 Norbeck, [name] ([id number]) -------------------------------------------------------------------------------- Physical Exam Details Patient Name: Serpa, Findlay D. Date of Service: 05/10/2016 2:15 PM Medical Record Patient Account Number: [id number] Number: Treating RN: Clover Mealy, RN, BSN, Rita January 01, 1956 [phone] y.o. Other Clinician: Date of Birth/Sex: Male) Treating Ansel Ferrall, Cadyn Primary Care Physician: SYSTEM, PCP Physician/Extender: G Referring Physician: Joen Laura Weeks in Treatment: 19 Notes Wound exam; the area is completely epithelialized. There is no  open wound here. He has a pes planus deformity and the foot. I would advise keeping this area  covered with a border foam for one to 2 months. Electronic Signature(s) Signed: 05/10/2016 4:24:59 PM By: Baltazar Najjar MD Entered By: Baltazar Najjar on 05/10/2016 15:09:50 Schlabach, [name] ([id number]) -------------------------------------------------------------------------------- Physician Orders Details Patient Name: [name] Date of Service: 05/10/2016 2:15 PM Medical Record Patient Account Number: [id number] Number: Treating RN: Clover Mealy, RN, BSN, Rita [birth year] [phone] y.o. Other Clinician: Date of Birth/Sex: Male) Treating Suhana Wilner, Deronte Primary Care Physician: SYSTEM, PCP Physician/Extender: G Referring Physician: Tedra Senegal in Treatment: 56 Verbal / Phone Orders: Yes Clinician: Afful, RN, BSN, Rita Read Back and Verified: Yes Diagnosis Coding Off-Loading o Open toe surgical shoe with peg assist. - Continue to wear until Diabetic shoes available o Other: - Diabetic shoes as available Cover area with bordered foam dressing for about 2 month Discharge From [facility] Services o Discharge from Wound Care Center - Treatment Completed Electronic Signature(s) Signed: 05/10/2016 5:23:07 PM By: Elpidio Eric BSN, RN Signed: 05/12/2016 7:36:04 AM By: Baltazar Najjar MD Previous Signature: 05/10/2016 4:24:59 PM Version By: Baltazar Najjar MD Entered By: Elpidio Eric on 05/10/2016 17:23:07 Gaunce, Abdulah D. ([id number]) -------------------------------------------------------------------------------- Problem List Details Patient Name: Schrack, Lehi D. Date of Service: 05/10/2016 2:15 PM Medical Record Patient Account Number: [id number] Number: Treating RN: Clover Mealy, RN, BSN, Rita [birth year] [phone] y.o. 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 ([id number]) -------------------------------------------------------------------------------- Progress Note Details Patient Name: [name] Date of Service: 05/10/2016 2:15 PM Medical Record Patient Account Number: [id number] Number: Treating RN: Clover Mealy, RN, BSN, Rita 1956-04-25 [phone] y.o. Other Clinician: Date of Birth/Sex: Male) Treating Kalisi Bevill, Keshon Primary Care Physician: SYSTEM, PCP Physician/Extender: G Referring Physician: Tedra Senegal in Treatment: 72 Subjective Chief Complaint Information obtained from Patient Patient is here for review of a wound on his left first metatarsal head that is been there for the last 6 months History of Present Illness (HPI) 12/28/15. This patient is a type II diabetic on insulin. He had vascular interventions 2 weeks ago on 1/17. This included an angioplasty of the left superficial femoral and popliteal arteries as well as a percutaneous transluminal angioplasty of the left posterior tibial artery. Nevertheless he is here for a plantar wound that the patient says is been there for the last 6 months. He initially told us that he is applying Neosporin to this however he tells me also that he has been in a nursing home for the last year. I found that to be a somewhat on usual combination of fax. In any case the patient is a diabetic with diabetic PAD. He is status post a right BKA in 1983  secondary to osteomyelitis in his foot. His ABI on the left side today was 0.62 01/11/16; we have been using Prisma to the wound areas on these first metatarsal head on the  left and the small wound on his left anterior ankle. He states that the area on his left metatarsal head was not changed all week, this might account for why there is so much maceration here. In the meantime he tells me he has fallen and hurt his shoulder x-ray is negative. He is also complaining of shortness of breath 01/18/16; the area on his left anterior lower leg/ankle is resolved. The area over his first metatarsal head looks better. There is less maceration tissue here. Surgical debridement to remove surface slough and nonviable subcutaneous tissue however this generally appears better. 01/25/16; patient arrives today with several areas which are small and superficial on the left leg. This may be from scratching which he admittedly does. The area over the left first plantar metatarsal head looks improved. There is no maceration surrounding skin looks normal. There is some epithelialization attempting to get over this wound. 02/01/16 the areas on the left leg have closed down. The area over the first plantar metatarsal head on the left looks stable to improved although there is some maceration around this. Switch to silver alginate/Aquacel Ag 02/08/16; the areas on his legs remain closed down. The area over the first plantar metatarsal head on the left has advancing epithelialization with only a small open area remaining. Patient always look short of breath when he comes to our clinic but he states his wheezing is improved. We switched to silver alginate last week due to maceration around the wound 4/4the area on his leg remains closed. The area over the left first plantar metatarsal head is fully epithelialized. Patient tells me he has new shoes coming for both areas prosthetic leg on the right than the draining fluid on the  left, this wound is certainly not ready for that type of force as of yet 02/22/16; the left first plantar metatarsal head has again split wide open which is exceptionally disappointing. Macerated surrounding skin debridement with subcutaneous tissue. Patient states he was exercising on an exercise bike, this friction over this area may of reopened this. DAYMION, NAZAIRE ([id number]) 02/29/16 left first metatarsal head wound is improved. Requires debridement, I removed some macerated surrounding skin nonviable surface slough. It appears better 03/07/16 left first metatarsal head. Using Aquacel Ag. There is advancing epithelialization but fragile 03/14/16; left first metatarsal head presents with a non viable surface. Debridement is necessary . Subcutaneous tissue is not viable 03/22/16; left first metatarsal head again with a nonviable surface. I aggressively debridement this of nonviable subcutaneous tissue this appears to get down to a healthy base although it is deep. I did culture this wound no empiric antibiotics 03/29/16; once again he comes in with a nonviable surface and purulent drainage of this wound. With simple debridement with a curet the tissue just. This and although this does not probe to bone it is precariously close with a depth of 0.5 cm. 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 04/05/16; a fair amount of drainage which was against his skin under the foam-based dressing according to our intake nurse. continues on Septra for MRSA 04/12/16; left first plantar metatarsal head wound. MRI showed osteomyelitis involving the sesamoid bones in the wound area. He is on Septra for a deep culture isolate that was doxycycline resistant 04/19/16: Patient's wound continues to improve. Review of epic last wk showed an Ef of 35% quoted in discharge summary from 03/25/15 although I don't see where this is coming form. This would make HBO difficult would at least [facility] cardiology  clearance 04/26/16; patient's wounds continued to improve. This is on the base of his remaining foot the. He has follow- up with cardiology with regards to his cardiac risk for HBO as well as if any additional procedures might be helpful in terms of his PAD 05/03/16; the patient saw cardiology who unfortunately simply referred him back to vascular surgery. I was hoping for more of a comprehensive review about whether he might need an arteriogram, as well as whether his cardiomyopathy would preclude him from hyperbarics. In any case the patient continues to make almost surprising improvement in his wound condition 05/10/16 the patient's wound is fully epithelialized. There is no open area here. He is still on antibiotics for the underlying osteomyelitis [Septra]. I had sent him to see cardiology with regards to his ischemic cardiomyopathy and ejection fraction of 35% with regards to his feasibility to do hyperbaric oxygen. If this wound reopens in short order I will revisit this. He is supposed to follow with aliments vein and vascular with regards to his ongoing PAD Objective Constitutional Vitals Time Taken: 2:35 PM, Height: 67 in, Weight: 195 lbs, BMI: 30.5, Temperature: 98.1 F, Pulse: 60 bpm, Respiratory Rate: 19 breaths/min, Blood Pressure: 142/55 mmHg. Integumentary (Hair, Skin) Wound #2 status is Healed - Epithelialized. Original cause of wound was Gradually Appeared. The wound is located on the Left,Plantar Foot. The wound measures 0cm length x 0cm width x 0cm depth; 0cm^2 area and 0cm^3 volume. The wound is limited to skin breakdown. There is no tunneling or undermining noted. There is a none present amount of drainage noted. The wound margin is distinct with the outline attached to the wound base. There is no granulation within the wound bed. There is no necrotic tissue within the wound bed. The periwound skin appearance exhibited: Dry/Scaly, Hemosiderin Staining, Mottled, Pallor.  The Buss, Hjalmer D. ([id number]) periwound skin appearance did not exhibit: Callus, Crepitus, Excoriation, Fluctuance, Friable, Induration, Localized Edema, Rash, Scarring, Maceration, Moist, Atrophie Blanche, Cyanosis, Ecchymosis, Rubor, Erythema. Periwound temperature was noted as Cool/Cold. 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 - Continue to wear until Diabetic shoes available Other: - Diabetic shoes as available Cover area with bordered foam dressing for about 1 month [facility] Discharge from Wound Care Center - Treatment Completed #1 continue to protect the area of the wound with a border foam. #2 I have given him permission to start ambulating again in his diabetic shoes with his prosthesis on on the other leg. #3 if this does not maintained a healed state, consider hyperbaric oxygen if he is capable of getting it Electronic Signature(s) Signed: 05/10/2016 4:24:59 PM By: Baltazar Najjarobson, Jartavious MD Entered By: Baltazar Najjarobson, Bodie on 05/10/2016 15:11:12 Araque, Juanjose D. ([id number]) -------------------------------------------------------------------------------- SuperBill Details Patient Name: [name], Jorel D. Date of Service: 05/10/2016 Medical Record Patient Account Number: [id number] Number: Treating RN: Clover MealyAfful, RN, BSN, Rita Jul 17, 1956 [phone](60 y.o. Other Clinician: Date of Birth/Sex: Male) Treating Tayven Renteria, Geovonni Primary Care Physician: SYSTEM, PCP Physician/Extender: G Referring Physician: Tedra SenegalBLISS, LAURA Weeks in Treatment: 19 Diagnosis Coding ICD-10 Codes Code Description M86.472 Chronic osteomyelitis with draining  sinus, left ankle and foot L97.522 Non-pressure chronic ulcer of other part of left foot with  fat layer exposed E11.621 Type 2 diabetes mellitus with foot ulcer E11.51 Type 2 diabetes mellitus with diabetic peripheral angiopathy without gangrene Facility Procedures CPT4 Code: [id number] Description: [phone] - WOUND CARE VISIT-LEV 2 EST PT Modifier: Quantity: 1 Physician Procedures CPT4 Code: [id number] Description: 08657 - WC PHYS LEVEL 2 - EST PT ICD-10 Description Diagnosis M86.472 Chronic osteomyelitis with draining sinus, left an Modifier: kle and foot Quantity: 1 Electronic Signature(s) Signed: 05/10/2016 4:24:59 PM By: Baltazar Najjar MD Entered By: Baltazar Najjar on 05/10/2016 15:12:05

## 2016-05-13 LAB — ECHOCARDIOGRAM COMPLETE
AOASC: 35 cm
CHL CUP MV DEC (S): 169
E/e' ratio: 13.39
EWDT: 169 ms
LA ID, A-P, ES: 41 mm
LA diam index: 2.04 cm/m2
LAVOL: 83.6 mL
LAVOLA4C: 79.7 mL
LAVOLIN: 41.5 mL/m2
LEFT ATRIUM END SYS DIAM: 41 mm
LVEEAVG: 13.39
LVEEMED: 13.39
LVELAT: 7.28 cm/s
LVOT SV: 51 mL
LVOT VTI: 16.2 cm
LVOT area: 3.14 cm2
LVOT peak vel: 83.5 cm/s
LVOTD: 20 mm
MV Peak grad: 4 mmHg
MVPKAVEL: 44.1 m/s
MVPKEVEL: 97.5 m/s
TAPSE: 12.9 mm
TDI e' lateral: 7.28
TDI e' medial: 3.38

## 2016-05-15 ENCOUNTER — Other Ambulatory Visit: Payer: Self-pay

## 2016-05-15 DIAGNOSIS — I10 Essential (primary) hypertension: Secondary | ICD-10-CM

## 2016-05-15 MED ORDER — CARVEDILOL 3.125 MG PO TABS: 3.1250 mg | ORAL_TABLET | Freq: Two times a day (BID) | ORAL | Status: AC

## 2016-05-18 NOTE — Progress Notes (Signed)
[name], [name] ([id number]) Visit Report for 04/26/2016 Arrival Information Details Patient Name: [name], [name] Date of Service: 04/26/2016 10:00 AM Medical Record Number: [id number] Patient Account Number: [id number] Date of Birth/Sex: 1956-08-12 [age] y.o. Male) Treating Frank: Clover MealyAfful, Frank, BSN, [name] Primary Care Physician: SYSTEM, PCP Other Clinician: Referring Physician: Joen LauraBLISS, Frank Treating Physician/Extender: [name] Weeks in Frank: 17 Visit Information History Since Last Visit Added or deleted any medications: No Patient Arrived: Wheel Chair Any new allergies or adverse reactions: No Arrival Time: 10:01 Had a fall or experienced change in No activities of daily living that may affect Accompanied By: self risk of falls: Transfer Assistance: None Signs or symptoms of abuse/neglect since last No Patient Identification Verified: Yes visito Secondary Verification Process Yes Hospitalized since last visit: No Completed: Has Dressing in Place as Prescribed: Yes Patient Requires Transmission-Based No Pain Present Now: No Precautions: Patient Has Alerts: No Electronic Signature(s) Signed: 04/26/2016 5:17:42 PM By: [name] Entered By: Tyler EricAfful, Rita on 04/26/2016 10:01:49 [name] Kitchen. ([id number]) -------------------------------------------------------------------------------- Encounter Discharge Information Details Patient Name: [name], [name] Date of Service: 04/26/2016 10:00 AM Medical Record Number: [id number] Patient Account Number: [id number] Date of Birth/Sex: 1956-08-12 [age] y.o. Male) Treating Frank: Clover MealyAfful, Frank, BSN, [name] Primary Care Physician: SYSTEM, PCP Other Clinician: Referring Physician: Joen LauraBLISS, Frank Treating Physician/Extender: Tyler CarolinaOBSON, [name] Weeks in Frank: 17 Encounter Discharge Information Items Discharge Pain Level: 0 Discharge Condition: Stable Ambulatory Status: Wheelchair Discharge Destination: Nursing Home Transportation:  Private Auto Accompanied By: self Schedule Follow-up Appointment: No Medication Reconciliation completed and provided to Patient/Care No [name]: Provided on Clinical Summary of Care: 04/26/2016 Form Type Recipient Paper [facility] Signature(s) Signed: 04/26/2016 10:30:48 AM By: [name] Entered By: [name] on 04/26/2016 10:30:48 Iacovelli, [name] ([id number]) -------------------------------------------------------------------------------- Lower Extremity Assessment Details Patient Name: [name], [name] Date of Service: 04/26/2016 10:00 AM Medical Record Number: [id number] Patient Account Number: [id number] Date of Birth/Sex: 1956-08-12 [age] y.o. Male) Treating Frank: Clover MealyAfful, Frank, BSN, [place] Frank Primary Care Physician: SYSTEM, PCP Other Clinician: Referring Physician: Joen LauraBLISS, Frank Treating Physician/Extender: [name]: 17 Vascular Assessment Pulses: Posterior Tibial Dorsalis Pedis Palpable: [Left:Yes] Extremity colors, hair growth, and conditions: Extremity Color: [Left:Mottled] Hair Growth on Extremity: [Left:No] Temperature of Extremity: [Left:Warm] Capillary Refill: [Left:< 3 seconds] Toe Nail Assessment Left: Right: Thick: Yes Discolored: Yes Deformed: No Improper Length and Hygiene: No Electronic Signature(s) Signed: 04/26/2016 5:17:42 PM By: [name] Entered By: Tyler EricAfful, Rita on 04/26/2016 10:04:52 [name], [name] ([id number]) -------------------------------------------------------------------------------- Multi Wound Chart Details Patient Name: [name], [name] Date of Service: 04/26/2016 10:00 AM Medical Record Number: [id number] Patient Account Number: [id number] Date of Birth/Sex: 1956-08-12 [age] y.o. Male) Treating Frank: Clover MealyAfful, Frank, BSN, [place] Frank Primary Care Physician: SYSTEM, PCP Other Clinician: Referring Physician: Joen LauraBLISS, Frank Treating Physician/Extender: [name] Weeks in Frank: 17 Vital  Signs Height(in): 67 Pulse(bpm): 72 Weight(lbs): 195 Blood Pressure 148/71 (mmHg): Body Mass Index(BMI): 31 Temperature(F): 98.2 Respiratory Rate 18 (breaths/min): Photos: [2:No Photos] [N/A:N/A] Wound Location: [2:Left Foot - Plantar] [N/A:N/A] Wounding Event: [2:Gradually Appeared] [N/A:N/A] Primary Etiology: [2:Diabetic Wound/Ulcer of the Lower Extremity] [N/A:N/A] Comorbid History: [2:Anemia, Chronic Obstructive Pulmonary Disease (COPD), Arrhythmia, Congestive Heart Failure, Coronary Artery Disease, Hypertension, Type II Diabetes, Neuropathy] [N/A:N/A] Date Acquired: [2:12/21/2014] [N/A:N/A] Weeks of Frank: [2:17] [N/A:N/A] Wound Status: [2:Open] [N/A:N/A] Measurements L x W x D 0.4x0.4x0.1 [N/A:N/A] (cm) Area (cm) : [2:0.126] [N/A:N/A] Volume (cm) : [2:0.013] [N/A:N/A] % Reduction in Area: [2:83.90%] [N/A:N/A] % Reduction in  Volume: 94.50% [N/A:N/A] Classification: [2:Grade 1] [N/A:N/A] Exudate Amount: [2:Large] [N/A:N/A] Exudate Type: [2:Serous] [N/A:N/A] Exudate Color: [2:amber] [N/A:N/A] Wound Margin: [2:Distinct, outline attached] [N/A:N/A] Granulation Amount: [2:Small (1-33%)] [N/A:N/A] Granulation Quality: [2:Pink] [N/A:N/A] Necrotic Amount: [2:Large (67-100%)] [N/A:N/A] Exposed Structures: Fascia: No N/A N/A Fat: No Tendon: No Muscle: No Joint: No Bone: No Limited to Skin Breakdown Epithelialization: Medium (34-66%) N/A N/A Periwound Skin Texture: Edema: No N/A N/A Excoriation: No Induration: No Callus: No Crepitus: No Fluctuance: No Friable: No Rash: No Scarring: No Periwound Skin Maceration: Yes N/A N/A Moisture: Moist: Yes Dry/Scaly: No Periwound Skin Color: Atrophie Blanche: No N/A N/A Cyanosis: No Ecchymosis: No Erythema: No Hemosiderin Staining: No Mottled: No Pallor: No Rubor: No Temperature: No Abnormality N/A N/A Tenderness on No N/A N/A Palpation: Wound Preparation: Ulcer Cleansing: N/A N/A Rinsed/Irrigated  with Saline Topical Anesthetic Applied: Other: lidocaine 4% Frank Notes Electronic Signature(s) Signed: 04/26/2016 5:17:42 PM By: Tyler Eric BSN, Frank Entered By: Tyler Eric on 04/26/2016 10:20:54 [name], [name] ([id number]) -------------------------------------------------------------------------------- Multi-Disciplinary Care Plan Details Patient Name: Tyler Milo D. Date of Service: 04/26/2016 10:00 AM Medical Record Number: [id number] Patient Account Number: [id number] Date of Birth/Sex: 1955-11-23 (60 y.o. Male) Treating Frank: Clover Mealy, Frank, BSN, [place] Primary Care Physician: SYSTEM, PCP Other Clinician: Referring Physician: Joen Frank Treating Physician/Extender: Tyler [place] in Frank: 17 Active Inactive Abuse / Safety / Falls / Self Care Management Nursing Diagnoses: Impaired home maintenance Impaired physical mobility Knowledge deficit related to: safety; personal, health (wound), emergency Potential for falls Self care deficit: actual or potential Goals: Patient will remain injury free Date Initiated: 12/28/2015 Goal Status: Active Patient/caregiver will verbalize understanding of skin care regimen Date Initiated: 12/28/2015 Goal Status: Active Patient/caregiver will verbalize/demonstrate measure taken to improve self care Date Initiated: 12/28/2015 Goal Status: Active Patient/caregiver will verbalize/demonstrate measures taken to improve the patient's personal safety Date Initiated: 12/28/2015 Goal Status: Active Patient/caregiver will verbalize/demonstrate measures taken to prevent injury and/or falls Date Initiated: 12/28/2015 Goal Status: Active Patient/caregiver will verbalize/demonstrate understanding of what to do in case of emergency Date Initiated: 12/28/2015 Goal Status: Active Interventions: Assess fall risk on admission and as needed Assess: immobility, friction, shearing, incontinence upon admission and as needed Assess impairment of  mobility on admission and as needed per policy Assess self care needs on admission and as needed Provide education on basic hygiene Pinkney, LUCY WOOLEVER. ([id number]) Provide education on fall prevention Provide education on personal and home safety Provide education on safe transfers Frank Activities: Education provided on Basic Hygiene : 03/07/2016 Notes: Orientation to the Wound Care Program Nursing Diagnoses: Knowledge deficit related to the wound healing center program Goals: Patient/caregiver will verbalize understanding of the Wound Healing Center Program Date Initiated: 12/28/2015 Goal Status: Active Interventions: Provide education on orientation to the wound center Notes: Venous Leg Ulcer Nursing Diagnoses: Knowledge deficit related to disease process and management Potential for venous Insuffiency (use before diagnosis confirmed) Goals: Non-invasive venous studies are completed as ordered Date Initiated: 12/28/2015 Goal Status: Active Patient/caregiver will verbalize understanding of disease process and disease management Date Initiated: 12/28/2015 Goal Status: Active Verify adequate tissue perfusion prior to therapeutic compression application Date Initiated: 12/28/2015 Goal Status: Active Interventions: Assess peripheral edema status every visit. Provide education on venous insufficiency Notes: Legere, Xiomar D. ([id number]) Wound/Skin Impairment Nursing Diagnoses: Impaired tissue integrity Knowledge deficit related to smoking impact on wound healing Knowledge deficit related to ulceration/compromised skin integrity Goals: Patient/caregiver will verbalize understanding of skin care regimen Date Initiated: 12/28/2015 Goal Status: Active Ulcer/skin  breakdown will have a volume reduction of 30% by week 4 Date Initiated: 12/28/2015 Goal Status: Active Ulcer/skin breakdown will have a volume reduction of 50% by week 8 Date Initiated: 12/28/2015 Goal Status:  Active Ulcer/skin breakdown will have a volume reduction of 80% by week 12 Date Initiated: 12/28/2015 Goal Status: Active Ulcer/skin breakdown will heal within 14 weeks Date Initiated: 12/28/2015 Goal Status: Active Interventions: Assess patient/caregiver ability to obtain necessary supplies Assess patient/caregiver ability to perform ulcer/skin care regimen upon admission and as needed Assess ulceration(s) every visit Provide education on ulcer and skin care Notes: Electronic Signature(s) Signed: 04/26/2016 5:17:42 PM By: [name] Entered By: Tyler EricAfful, Rita on 04/26/2016 10:20:35 Menden, Wendell D. ([id number]) -------------------------------------------------------------------------------- Pain Assessment Details Patient Name: [name], [name] Date of Service: 04/26/2016 10:00 AM Medical Record Number: [id number] Patient Account Number: [id number] Date of Birth/Sex: 23-Jun-1956 [age] y.o. Male) Treating Frank: Clover MealyAfful, Frank, BSN, [place] Frank Primary Care Physician: SYSTEM, PCP Other Clinician: Referring Physician: Joen LauraBLISS, Frank Treating Physician/Extender: Tyler CarolinaOBSON, Benson Frank Weeks in Frank: 17 Active Problems Location of Pain Severity and Description of Pain Patient Has Paino No Site Locations With Dressing Change: No Pain Management and Medication Current Pain Management: Electronic Signature(s) Signed: 04/26/2016 5:17:42 PM By: [name] Entered By: Tyler EricAfful, Rita on 04/26/2016 10:03:24 [name], Xachary D. ([id number]) -------------------------------------------------------------------------------- Patient/Caregiver Education Details Patient Name: [name], [name] Date of Service: 04/26/2016 10:00 AM Medical Record Number: [id number] Patient Account Number: [id number] Date of Birth/Gender: 23-Jun-1956 [age] y.o. Male) Treating Frank: Clover MealyAfful, Frank, BSN, [place] Frank Primary Care Physician: SYSTEM, PCP Other Clinician: Referring Physician: Joen LauraBLISS, Frank Treating Physician/Extender: Tyler CarolinaOBSON, Jeffre  Frank Weeks in Frank: 17 Education Assessment Education Provided To: Patient Education Topics Provided Basic Hygiene: Methods: Explain/Verbal Responses: State content correctly Safety: Methods: Explain/Verbal Responses: State content correctly Venous: Methods: Explain/Verbal Responses: State content correctly Welcome To The Wound Care Center: Methods: Explain/Verbal Responses: State content correctly Wound/Skin Impairment: Methods: Explain/Verbal Responses: State content correctly Electronic Signature(s) Signed: 04/26/2016 5:17:42 PM By: [name] Entered By: Tyler EricAfful, Rita on 04/26/2016 10:24:38 [name], Mahir D. ([id number]) -------------------------------------------------------------------------------- Wound Assessment Details Patient Name: Velardi, Hrishikesh D. Date of Service: 04/26/2016 10:00 AM Medical Record Number: [id number] Patient Account Number: [id number] Date of Birth/Sex: 23-Jun-1956 [age] y.o. Male) Treating Frank: Afful, Frank, BSN, Rita Primary Care Physician: SYSTEM, PCP Other Clinician: Referring Physician: Joen LauraBLISS, Frank Treating Physician/Extender: Tyler CaulOBSON, Erique Frank Weeks in Frank: 17 Wound Status Wound Number: 2 Primary Diabetic Wound/Ulcer of the Lower Etiology: Extremity Wound Location: Left Foot - Plantar Wound Open Wounding Event: Gradually Appeared Status: Date Acquired: 12/21/2014 Comorbid Anemia, Chronic Obstructive Pulmonary Weeks Of Frank: 17 History: Disease (COPD), Arrhythmia, Clustered Wound: No Congestive Heart Failure, Coronary Artery Disease, Hypertension, Type II Diabetes, Neuropathy Photos Photo Uploaded By: Tyler EricAfful, Rita on 04/26/2016 17:13:27 Wound Measurements Length: (cm) 0.4 Width: (cm) 0.4 Depth: (cm) 0.1 Area: (cm) 0.126 Volume: (cm) 0.013 % Reduction in Area: 83.9% % Reduction in Volume: 94.5% Epithelialization: Medium (34-66%) Tunneling: No Undermining: No Wound Description Classification: Grade 1 Wound  Margin: Distinct, outline attached Exudate Amount: Large Exudate Type: Serous Exudate Color: amber Foul Odor After Cleansing: No Wound Bed Granulation Amount: Small (1-33%) Exposed Structure Granulation Quality: Pink Fascia Exposed: No Dorsey, Ariq D. ([id number]) Necrotic Amount: Large (67-100%) Fat Layer Exposed: No Necrotic Quality: Adherent Slough Tendon Exposed: No Muscle Exposed: No Joint Exposed: No Bone Exposed: No Limited to Skin Breakdown Periwound Skin Texture Texture Color No Abnormalities Noted: No No Abnormalities Noted: No Callus: No Atrophie Blanche:  No Crepitus: No Cyanosis: No Excoriation: No Ecchymosis: No Fluctuance: No Erythema: No Friable: No Hemosiderin Staining: No Induration: No Mottled: No Localized Edema: No Pallor: No Rash: No Rubor: No Scarring: No Temperature / Pain Moisture Temperature: No Abnormality No Abnormalities Noted: No Dry / Scaly: No Maceration: Yes Moist: Yes Wound Preparation Ulcer Cleansing: Rinsed/Irrigated with Saline Topical Anesthetic Applied: Other: lidocaine 4%, Electronic Signature(s) Signed: 04/26/2016 5:17:42 PM By: Tyler Eric BSN, Frank Entered By: Tyler Eric on 04/26/2016 10:20:20 Fedie, [name] ([id number]) -------------------------------------------------------------------------------- Vitals Details Patient Name: Tyler Milo D. Date of Service: 04/26/2016 10:00 AM Medical Record Number: [id number] Patient Account Number: [id number] Date of Birth/Sex: 01-Jan-1956 (60 y.o. Male) Treating Frank: Afful, Frank, BSN, Rita Primary Care Physician: SYSTEM, PCP Other Clinician: Referring Physician: Joen Frank Treating Physician/Extender: Tyler [place] in Frank: 17 Vital Signs Time Taken: 10:03 Temperature (F): 98.2 Height (in): 67 Pulse (bpm): 72 Weight (lbs): 195 Respiratory Rate (breaths/min): 18 Body Mass Index (BMI): 30.5 Blood Pressure (mmHg): 148/71 Reference Range: 80 - 120 mg /  dl Electronic Signature(s) Signed: 04/26/2016 5:17:42 PM By: Tyler Eric BSN, Frank Entered By: Tyler Eric on 04/26/2016 10:04:32

## 2016-05-18 NOTE — Progress Notes (Signed)
GEREMY, RISTER ([id number]) Visit Report for 04/26/2016 Chief Complaint Document Details Patient Name: [name], [name]. Date of Service: 04/26/2016 10:00 AM Medical Record Patient Account Number: [id number] Number: Treating RN: Afful, RN, BSN, Rita 07/05/1956 [phone] y.o. Other Clinician: Date of Birth/Sex: Male) Treating [name] Tullis, Maurisio Primary Care Physician: SYSTEM, PCP Physician/Extender: G Referring Physician: Tedra Senegal in Treatment: 17 Information Obtained from: Patient Chief Complaint Patient is here for review of a wound on his left first metatarsal head that is been there for the last 6 months Electronic Signature(s) Signed: 05/18/2016 7:33:08 AM By: Baltazar Najjar MD Entered By: Baltazar Najjar on 04/26/2016 10:28:21 Steeves, [name] ([id number]) -------------------------------------------------------------------------------- Debridement Details Patient Name: Tyler Milo D. Date of Service: 04/26/2016 10:00 AM Medical Record Patient Account Number: [id number] Number: Treating RN: Afful, RN, BSN, Rita [birth year] [phone] y.o. Other Clinician: Date of Birth/Sex: Male) Treating Elorah Dewing, Normon Primary Care Physician: SYSTEM, PCP Physician/Extender: G Referring Physician: Tedra Senegal in Treatment: 17 Debridement Performed for Wound #2 Left,Plantar Foot Assessment: Performed By: Physician [name], MD Debridement: Debridement Pre-procedure Yes Verification/Time Out Taken: Start Time: 10:19 Pain Control: Lidocaine 4% Topical Solution Level: Skin/Subcutaneous Tissue Total Area Debrided (L x 0.4 (cm) x 0.4 (cm) = 0.16 (cm) W): Tissue and other Non-Viable, Fibrin/Slough, Subcutaneous material debrided: Instrument: Curette Bleeding: Minimum Hemostasis Achieved: Pressure End Time: 10:21 Procedural Pain: 0 Post Procedural Pain: 0 Response to Treatment: Procedure was tolerated well Post Debridement Measurements of Total  Wound Length: (cm) 0.4 Width: (cm) 0.4 Depth: (cm) 0.1 Volume: (cm) 0.013 Post Procedure Diagnosis Same as Pre-procedure Electronic Signature(s) Signed: 04/26/2016 5:17:42 PM By: Elpidio Eric BSN, RN Signed: 05/18/2016 7:33:08 AM By: Baltazar Najjar MD Entered By: Baltazar Najjar on 04/26/2016 10:28:00 Paul, Cope D. ([id number]) -------------------------------------------------------------------------------- HPI Details Patient Name: Tyler Milo D. Date of Service: 04/26/2016 10:00 AM Medical Record Patient Account Number: [id number] Number: Treating RN: Afful, RN, BSN, Rita 1955-12-11 [phone] y.o. Other Clinician: Date of Birth/Sex: Male) Treating Raylea Adcox, Demarques Primary Care Physician: SYSTEM, PCP Physician/Extender: G Referring Physician: Tedra Senegal in Treatment: 17 History of Present Illness HPI Description: 12/28/15. This patient is a type II diabetic on insulin. He had vascular interventions 2 weeks ago on 1/17. This included an angioplasty of the left superficial femoral and popliteal arteries as well as a percutaneous transluminal angioplasty of the left posterior tibial artery. Nevertheless he is here for a plantar wound that the patient says is been there for the last 6 months. He initially told us that he is applying Neosporin to this however he tells me also that he has been in a nursing home for the last year. I found that to be a somewhat on usual combination of fax. In any case the patient is a diabetic with diabetic PAD. 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 His ABI on the left side today was 0.62 01/11/16; we have been using Prisma to the wound areas on these first metatarsal head on the left and the small wound on his left anterior ankle. He states that the area on his left metatarsal head was not changed all week, this might account for why there is so much maceration here. In the meantime he tells me he  has fallen and hurt his shoulder x-ray is negative. He is also complaining of shortness of breath 01/18/16; the area on his left anterior lower leg/ankle is resolved. The area over his first metatarsal head  looks better. There is less maceration tissue here. Surgical debridement to remove surface slough and nonviable subcutaneous tissue however this generally appears better. 01/25/16; patient arrives today with several areas which are small and superficial on the left leg. This may be from scratching which he admittedly does. The area over the left first plantar metatarsal head looks improved. There is no maceration surrounding skin looks normal. There is some epithelialization attempting to get over this wound. 02/01/16 the areas on the left leg have closed down. The area over the first plantar metatarsal head on the left looks stable to improved although there is some maceration around this. Switch to silver alginate/Aquacel Ag 02/08/16; the areas on his legs remain closed down. The area over the first plantar metatarsal head on the left has advancing epithelialization with only a small open area remaining. Patient always look short of breath when he comes to our clinic but he states his wheezing is improved. We switched to silver alginate last week due to maceration around the wound 4/4the area on his leg remains closed. The area over the left first plantar metatarsal head is fully epithelialized. Patient tells me he has new shoes coming for both areas prosthetic leg on the right than the draining fluid on the left, this wound is certainly not ready for that type of force as of yet 02/22/16; the left first plantar metatarsal head has again split wide open which is exceptionally disappointing. Macerated surrounding skin debridement with subcutaneous tissue. Patient states he was exercising on an exercise bike, this friction over this area may of reopened this. 02/29/16 left first metatarsal head  wound is improved. Requires debridement, I removed some macerated surrounding skin nonviable surface slough. It appears better 03/07/16 left first metatarsal head. Using Aquacel Ag. There is advancing epithelialization but fragile 03/14/16; left first metatarsal head presents with a non viable surface. Debridement is necessary . Subcutaneous tissue is not viable [name], [name] ([id number]) 03/22/16; left first metatarsal head again with a nonviable surface. I aggressively debridement this of nonviable subcutaneous tissue this appears to get down to a healthy base although it is deep. I did culture this wound no empiric antibiotics 03/29/16; once again he comes in with a nonviable surface and purulent drainage of this wound. With simple debridement with a curet the tissue just. This and although this does not probe to bone it is precariously close with a depth of 0.5 cm. 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 04/05/16; a fair amount of drainage which was against his skin under the foam-based dressing according to our intake nurse. continues on Septra for MRSA 04/12/16; left first plantar metatarsal head wound. MRI showed osteomyelitis involving the sesamoid bones in the wound area. He is on Septra for a deep culture isolate that was doxycycline resistant 04/19/16: Patient's wound continues to improve. Review of epic last wk showed an Ef of 35% quoted in discharge summary from 03/25/15 although I don't see where this is coming form. This would make HBO difficult would at least [facility] cardiology clearance 04/26/16; patient's wounds continued to improve. This is on the base of his remaining foot the. He has follow- up with cardiology with regards to his cardiac risk for HBO as well as if any additional procedures might be helpful in terms of his PAD Electronic Signature(s) Signed: 05/18/2016 7:33:08 AM By: Baltazar Najjar MD Entered By: Baltazar Najjar on 04/26/2016 10:29:27 [name], [name]  ([id number]) -------------------------------------------------------------------------------- Physical Exam Details Patient Name: Stapleton, Kristan D. Date  of Service: 04/26/2016 10:00 AM Medical Record Patient Account Number: [id number] Number: Treating RN: Afful, RN, BSN, Rita [birth year] [phone](59 y.o. Other Clinician: Date of Birth/Sex: Male) Treating Iola Turri, Jacorion Primary Care Physician: SYSTEM, PCP Physician/Extender: G Referring Physician: Joen LauraBLISS, LAURA Weeks in Treatment: 17 Notes Wound exam; he continues to improve. Still requires a debridement of surface slough nonviable subcutaneous tissue which he tolerates well. The wound continues to be smaller. Electronic Signature(s) Signed: 05/18/2016 7:33:08 AM By: Baltazar Najjarobson, [place] MD Entered By: Baltazar Najjarobson, Chett on 04/26/2016 10:30:00 [name], [name] ([id number]) -------------------------------------------------------------------------------- Physician Orders Details Patient Name: Tyler MiloHAM, [name] Date of Service: 04/26/2016 10:00 AM Medical Record Patient Account Number: [id number] Number: Treating RN: Afful, RN, BSN, Rita [birth year] [phone](59 y.o. Other Clinician: Date of Birth/Sex: Male) Treating Roger Fasnacht, Amel Primary Care Physician: SYSTEM, PCP Physician/Extender: G Referring Physician: Tedra SenegalBLISS, LAURA Weeks in Treatment: [id number] Verbal / Phone Orders: Yes Clinician: Afful, RN, BSN, Rita Read Back and Verified: Yes Diagnosis Coding Wound Cleansing Wound #2 Left,Plantar Foot o Clean wound with Normal Saline. Anesthetic Wound #2 Left,Plantar Foot o Topical Lidocaine 4% cream applied to wound bed prior to debridement Skin Barriers/Peri-Wound Care Wound #2 Left,Plantar Foot o Skin Prep Primary Wound Dressing Wound #2 Left,Plantar Foot o Aquacel Ag - or silver alginate only Secondary Dressing Wound #2 Left,Plantar Foot o Boardered Foam Dressing - netting Dressing Change Frequency Wound #2 Left,Plantar Foot o  Change dressing every other day. Follow-up Appointments Wound #2 Left,Plantar Foot o Return Appointment in 1 week. Off-Loading Wound #2 Left,Plantar Foot o Open toe surgical shoe with peg assist. [name], [name] ([id number]) Additional Orders / Instructions Wound #2 Left,Plantar Foot o Increase protein intake. o Activity as tolerated Medications-please add to medication list. Wound #2 Left,Plantar Foot o P.O. Antibiotics - Septra DS 1 po BID for 6 wks o Other: - Vitamin C, Zinc, Multivitamin Electronic Signature(s) Signed: 04/26/2016 5:17:42 PM By: Elpidio EricAfful, Rita BSN, RN Signed: 05/18/2016 7:33:08 AM By: Baltazar Najjarobson, Maurico MD Entered By: Elpidio EricAfful, Rita on 04/26/2016 10:22:49 [name], [name] ([id number]) -------------------------------------------------------------------------------- Problem List Details Patient Name: [name], [name] Date of Service: 04/26/2016 10:00 AM Medical Record Patient Account Number: [id number] Number: Treating RN: Afful, RN, BSN, Rita [birth year] [phone](59 y.o. 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 ([id number]) -------------------------------------------------------------------------------- Progress Note  Details Patient Name: Tyler MiloHAM, Monterrio D. Date of Service: 04/26/2016 10:00 AM Medical Record Patient Account Number: [id number] Number: Treating RN: Afful, RN, BSN, Rita [birth year] [phone](59 y.o. Other Clinician: Date of Birth/Sex: Male) Treating Alyssamarie Mounsey, Avram Primary Care Physician: SYSTEM, PCP Physician/Extender: G Referring Physician: Tedra SenegalBLISS, LAURA Weeks in Treatment: 17 Subjective Chief Complaint Information obtained from Patient Patient is here for review of a wound on his left first metatarsal head that is been there for the last 6 months History of Present Illness (HPI) 12/28/15. This patient is a type II diabetic on insulin. He had vascular interventions 2 weeks ago  on 1/17. This included an angioplasty of the left superficial femoral and popliteal arteries as well as a percutaneous transluminal angioplasty of the left posterior tibial artery. Nevertheless he is here for a plantar wound that the patient says is been there for the last 6 months. He initially told us that he is applying Neosporin to this however he tells me also that he has been in a nursing home for the last year. I found that to be a somewhat on usual combination of fax. In any case the patient is a diabetic with diabetic PAD. 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 His ABI on the left side today was 0.62 01/11/16; we have been using Prisma to the wound areas on these first metatarsal head on the left and the small wound on his left anterior ankle. He states that the area on his left metatarsal head was not changed all week, this might account for why there is so much maceration here. In the meantime he tells me he has fallen and hurt his shoulder x-ray is negative. He is also complaining of shortness of breath 01/18/16; the area on his left anterior lower leg/ankle is resolved. The area over his first metatarsal head looks better. There is less maceration tissue here. Surgical  debridement to remove surface slough and nonviable subcutaneous tissue however this generally appears better. 01/25/16; patient arrives today with several areas which are small and superficial on the left leg. This may be from scratching which he admittedly does. The area over the left first plantar metatarsal head looks improved. There is no maceration surrounding skin looks normal. There is some epithelialization attempting to get over this wound. 02/01/16 the areas on the left leg have closed down. The area over the first plantar metatarsal head on the left looks stable to improved although there is some maceration around this. Switch to silver alginate/Aquacel Ag 02/08/16; the areas on his legs remain closed down. The area over the first plantar metatarsal head on the left has advancing epithelialization with only a small open area remaining. Patient always look short of breath when he comes to our clinic but he states his wheezing is improved. We switched to silver alginate last week due to maceration around the wound 4/4the area on his leg remains closed. The area over the left first plantar metatarsal head is fully epithelialized. Patient tells me he has new shoes coming for both areas prosthetic leg on the right than the draining fluid on the left, this wound is certainly not ready for that type of force as of yet 02/22/16; the left first plantar metatarsal head has again split wide open which is exceptionally disappointing. Macerated surrounding skin debridement with subcutaneous tissue. Patient states he was exercising on an exercise bike, this friction over this area may of reopened this. ZARIF, RATHJE ([id number]) 02/29/16 left first metatarsal head wound is improved. Requires debridement, I removed some macerated surrounding skin nonviable surface slough. It appears better 03/07/16 left first metatarsal head. Using Aquacel Ag. There is advancing epithelialization but fragile 03/14/16;  left first metatarsal head presents with a non viable surface. Debridement is necessary . Subcutaneous tissue is not viable 03/22/16; left first metatarsal head again with a nonviable surface. I aggressively debridement this of nonviable subcutaneous tissue this appears to get down to a healthy base although it is deep. I did culture this wound no empiric antibiotics 03/29/16; once again he comes in with a nonviable  surface and purulent drainage of this wound. With simple debridement with a curet the tissue just. This and although this does not probe to bone it is precariously close with a depth of 0.5 cm. 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 04/05/16; a fair amount of drainage which was against his skin under the foam-based dressing according to our intake nurse. continues on Septra for MRSA 04/12/16; left first plantar metatarsal head wound. MRI showed osteomyelitis involving the sesamoid bones in the wound area. He is on Septra for a deep culture isolate that was doxycycline resistant 04/19/16: Patient's wound continues to improve. Review of epic last wk showed an Ef of 35% quoted in discharge summary from 03/25/15 although I don't see where this is coming form. This would make [facility] difficult [facility] 04/26/16; patient's wounds continued to improve. This is on the base of his remaining foot the. He has follow- up with cardiology with regards to his cardiac risk for HBO as well as if any additional procedures might be helpful in terms of his PAD Objective Constitutional Vitals Time Taken: 10:03 AM, Height: 67 in, Weight: 195 lbs, BMI: 30.5, Temperature: 98.2 F, Pulse: 72 bpm, Respiratory Rate: 18 breaths/min, Blood Pressure: 148/71 mmHg. Integumentary (Hair, Skin) Wound #2 status is Open. Original cause of wound was Gradually Appeared. The wound is located on the Left,Plantar Foot. The wound measures 0.4cm length x 0.4cm width x 0.1cm depth;  0.126cm^2 area and 0.013cm^3 volume. The wound is limited to skin breakdown. There is no tunneling or undermining noted. There is a large amount of serous drainage noted. The wound margin is distinct with the outline attached to the wound base. There is small (1-33%) pink granulation within the wound bed. There is a large (67-100%) amount of necrotic tissue within the wound bed including Adherent Slough. The periwound skin appearance exhibited: Maceration, Moist. The periwound skin appearance did not exhibit: Callus, Crepitus, Excoriation, Fluctuance, Friable, Induration, Localized Edema, Rash, Scarring, Dry/Scaly, Atrophie Blanche, Cyanosis, Ecchymosis, Hemosiderin Staining, Mottled, Pallor, Rubor, Erythema. Periwound temperature was noted as No Abnormality. [name], [name] 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 There was a Skin/Subcutaneous Tissue Debridement ([id number]) debridement with total area of 0.16 sq cm performed by Maxwell CaulOBSON, Deondre G, MD. with the following instrument(s): Curette to remove Non-Viable tissue/material including Fibrin/Slough and Subcutaneous after achieving pain control using Lidocaine 4% Topical Solution. A time out was conducted prior to the start of the procedure. A Minimum amount of bleeding was controlled with Pressure. The procedure was tolerated well with a pain level of 0 throughout and a pain level of 0 following the procedure. Post Debridement Measurements: 0.4cm length x 0.4cm width x 0.1cm depth; 0.013cm^3 volume. Post procedure Diagnosis  Wound #2: Same as Pre-Procedure Plan Wound Cleansing: Wound #2 Left,Plantar Foot: Clean wound with Normal Saline. Anesthetic: Wound #2 Left,Plantar Foot: Topical Lidocaine 4% cream applied to wound bed prior to debridement Skin Barriers/Peri-Wound Care: Wound #2 Left,Plantar Foot: Skin Prep Primary Wound Dressing: Wound #2 Left,Plantar Foot: Aquacel Ag - or silver alginate only Secondary Dressing: Wound #2 Left,Plantar Foot: [name], [name] (  [id number]) Boardered Foam Dressing - netting Dressing Change Frequency: Wound #2 Left,Plantar Foot: Change dressing every other day. Follow-up Appointments: Wound #2 Left,Plantar Foot: Return Appointment in 1 week. Off-Loading: Wound #2 Left,Plantar Foot: Open toe surgical shoe with peg assist. Additional Orders / Instructions: Wound #2 Left,Plantar Foot: Increase protein intake. Activity as tolerated Medications-please add to medication list.: Wound #2 Left,Plantar Foot: P.O. Antibiotics - Septra DS 1 po BID for 6 wks Other: - Vitamin C, Zinc, Multivitamin #1 we'll continue with Aquacel Ag, foam-based cover. This is being changed every second day at the nursing home # 2 we will continue with Septra DS for a full 6 week course #3 he will see cardiology I believe later in the week with regards to his underlying ischemic cardiomyopathy/risk for HBO as well as his PAD #4 in spite of the PAD diabetes etc. his wound continues to look as though it's getting smaller and healthier Electronic Signature(s) Signed: 05/18/2016 7:33:08 AM By: Baltazar Najjar MD Entered By: Baltazar Najjar on 04/26/2016 10:31:22 Ranney, [name] ([id number]) -------------------------------------------------------------------------------- SuperBill Details Patient Name: Tyler Milo D. Date of Service: 04/26/2016 Medical Record Patient Account Number: [id number] Number: Treating RN: Clover Mealy, RN, BSN, Rita 12-May-1956 [phone] y.o. 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 E11.621 Type 2 diabetes mellitus with foot ulcer E11.51 Type 2 diabetes mellitus with diabetic peripheral angiopathy without gangrene Facility Procedures CPT4 Code Description: [id number] 11042 - DEB SUBQ TISSUE 20 SQ CM/< ICD-10 Description Diagnosis L97.522 Non-pressure chronic ulcer of other part of left foot Modifier: with fat lay Quantity: 1 er exposed Physician Procedures CPT4 Code Description: [phone] 11042 - WC PHYS SUBQ TISS 20 SQ CM ICD-10 Description Diagnosis L97.522 Non-pressure chronic ulcer of other part of left foot Modifier: with fat laye Quantity: 1 r exposed Electronic Signature(s) Signed: 05/18/2016 7:33:08 AM By: Baltazar Najjar MD Entered By: Baltazar Najjar on 04/26/2016 10:32:01

## 2016-05-23 ENCOUNTER — Inpatient Hospital Stay: Payer: Medicare Other

## 2016-05-23 ENCOUNTER — Emergency Department: Payer: Medicare Other

## 2016-05-23 ENCOUNTER — Inpatient Hospital Stay
Admission: EM | Admit: 2016-05-23 | Discharge: 2016-06-13 | DRG: 296 | Disposition: E | Payer: Medicare Other | Attending: Internal Medicine | Admitting: Internal Medicine

## 2016-05-23 ENCOUNTER — Inpatient Hospital Stay: Admit: 2016-05-23 | Payer: Medicare Other

## 2016-05-23 ENCOUNTER — Encounter: Payer: Self-pay | Admitting: Emergency Medicine

## 2016-05-23 DIAGNOSIS — Z8249 Family history of ischemic heart disease and other diseases of the circulatory system: Secondary | ICD-10-CM

## 2016-05-23 DIAGNOSIS — R748 Abnormal levels of other serum enzymes: Secondary | ICD-10-CM | POA: Diagnosis present

## 2016-05-23 DIAGNOSIS — I4892 Unspecified atrial flutter: Secondary | ICD-10-CM | POA: Diagnosis present

## 2016-05-23 DIAGNOSIS — Z794 Long term (current) use of insulin: Secondary | ICD-10-CM | POA: Diagnosis not present

## 2016-05-23 DIAGNOSIS — J9621 Acute and chronic respiratory failure with hypoxia: Secondary | ICD-10-CM

## 2016-05-23 DIAGNOSIS — Z808 Family history of malignant neoplasm of other organs or systems: Secondary | ICD-10-CM

## 2016-05-23 DIAGNOSIS — R40243 Glasgow coma scale score 3-8, unspecified time: Secondary | ICD-10-CM | POA: Diagnosis present

## 2016-05-23 DIAGNOSIS — Z7902 Long term (current) use of antithrombotics/antiplatelets: Secondary | ICD-10-CM

## 2016-05-23 DIAGNOSIS — I252 Old myocardial infarction: Secondary | ICD-10-CM

## 2016-05-23 DIAGNOSIS — J439 Emphysema, unspecified: Secondary | ICD-10-CM | POA: Diagnosis present

## 2016-05-23 DIAGNOSIS — Z89511 Acquired absence of right leg below knee: Secondary | ICD-10-CM

## 2016-05-23 DIAGNOSIS — Z807 Family history of other malignant neoplasms of lymphoid, hematopoietic and related tissues: Secondary | ICD-10-CM

## 2016-05-23 DIAGNOSIS — R253 Fasciculation: Secondary | ICD-10-CM | POA: Diagnosis present

## 2016-05-23 DIAGNOSIS — Z515 Encounter for palliative care: Secondary | ICD-10-CM | POA: Diagnosis present

## 2016-05-23 DIAGNOSIS — I469 Cardiac arrest, cause unspecified: Principal | ICD-10-CM | POA: Diagnosis present

## 2016-05-23 DIAGNOSIS — Z86711 Personal history of pulmonary embolism: Secondary | ICD-10-CM | POA: Diagnosis not present

## 2016-05-23 DIAGNOSIS — I4891 Unspecified atrial fibrillation: Secondary | ICD-10-CM | POA: Diagnosis present

## 2016-05-23 DIAGNOSIS — Z951 Presence of aortocoronary bypass graft: Secondary | ICD-10-CM | POA: Diagnosis not present

## 2016-05-23 DIAGNOSIS — J9601 Acute respiratory failure with hypoxia: Secondary | ICD-10-CM | POA: Diagnosis present

## 2016-05-23 DIAGNOSIS — F0390 Unspecified dementia without behavioral disturbance: Secondary | ICD-10-CM | POA: Diagnosis present

## 2016-05-23 DIAGNOSIS — R4020 Unspecified coma: Secondary | ICD-10-CM | POA: Diagnosis present

## 2016-05-23 DIAGNOSIS — I5021 Acute systolic (congestive) heart failure: Secondary | ICD-10-CM

## 2016-05-23 DIAGNOSIS — I214 Non-ST elevation (NSTEMI) myocardial infarction: Secondary | ICD-10-CM | POA: Diagnosis not present

## 2016-05-23 DIAGNOSIS — Z79899 Other long term (current) drug therapy: Secondary | ICD-10-CM | POA: Diagnosis not present

## 2016-05-23 DIAGNOSIS — Z4659 Encounter for fitting and adjustment of other gastrointestinal appliance and device: Secondary | ICD-10-CM

## 2016-05-23 DIAGNOSIS — I2510 Atherosclerotic heart disease of native coronary artery without angina pectoris: Secondary | ICD-10-CM | POA: Diagnosis present

## 2016-05-23 DIAGNOSIS — Z7982 Long term (current) use of aspirin: Secondary | ICD-10-CM

## 2016-05-23 DIAGNOSIS — I10 Essential (primary) hypertension: Secondary | ICD-10-CM

## 2016-05-23 DIAGNOSIS — I5022 Chronic systolic (congestive) heart failure: Secondary | ICD-10-CM | POA: Diagnosis present

## 2016-05-23 DIAGNOSIS — Z87891 Personal history of nicotine dependence: Secondary | ICD-10-CM

## 2016-05-23 DIAGNOSIS — R402432 Glasgow coma scale score 3-8, at arrival to emergency department: Secondary | ICD-10-CM

## 2016-05-23 DIAGNOSIS — Z66 Do not resuscitate: Secondary | ICD-10-CM | POA: Diagnosis present

## 2016-05-23 DIAGNOSIS — I110 Hypertensive heart disease with heart failure: Secondary | ICD-10-CM | POA: Diagnosis present

## 2016-05-23 DIAGNOSIS — Z6829 Body mass index (BMI) 29.0-29.9, adult: Secondary | ICD-10-CM

## 2016-05-23 DIAGNOSIS — G9341 Metabolic encephalopathy: Secondary | ICD-10-CM

## 2016-05-23 DIAGNOSIS — N179 Acute kidney failure, unspecified: Secondary | ICD-10-CM

## 2016-05-23 DIAGNOSIS — F329 Major depressive disorder, single episode, unspecified: Secondary | ICD-10-CM | POA: Diagnosis present

## 2016-05-23 DIAGNOSIS — E1151 Type 2 diabetes mellitus with diabetic peripheral angiopathy without gangrene: Secondary | ICD-10-CM | POA: Diagnosis present

## 2016-05-23 DIAGNOSIS — Z955 Presence of coronary angioplasty implant and graft: Secondary | ICD-10-CM

## 2016-05-23 DIAGNOSIS — Z820 Family history of epilepsy and other diseases of the nervous system: Secondary | ICD-10-CM

## 2016-05-23 LAB — PROTIME-INR
INR: 1.24
INR: 1.21
PROTHROMBIN TIME: 15.8 s — AB (ref 11.4–15.0)
PROTHROMBIN TIME: 15.5 s — AB (ref 11.4–15.0)

## 2016-05-23 LAB — COMPREHENSIVE METABOLIC PANEL
ALBUMIN: 3.5 g/dL (ref 3.5–5.0)
ALT: 50 U/L (ref 17–63)
ANION GAP: 17 — AB (ref 5–15)
AST: 82 U/L — ABNORMAL HIGH (ref 15–41)
Alkaline Phosphatase: 108 U/L (ref 38–126)
BUN: 34 mg/dL — ABNORMAL HIGH (ref 6–20)
CHLORIDE: 100 mmol/L — AB (ref 101–111)
CO2: 21 mmol/L — AB (ref 22–32)
Calcium: 10.9 mg/dL — ABNORMAL HIGH (ref 8.9–10.3)
Creatinine, Ser: 1.5 mg/dL — ABNORMAL HIGH (ref 0.61–1.24)
GFR calc Af Amer: 56 mL/min — ABNORMAL LOW (ref >60)
GFR calc non Af Amer: 49 mL/min — ABNORMAL LOW (ref >60)
GLUCOSE: 226 mg/dL — AB (ref 65–99)
POTASSIUM: 4.1 mmol/L (ref 3.5–5.1)
SODIUM: 138 mmol/L (ref 135–145)
Total Bilirubin: 0.8 mg/dL (ref 0.3–1.2)
Total Protein: 8 g/dL (ref 6.5–8.1)

## 2016-05-23 LAB — BASIC METABOLIC PANEL
Anion gap: 8 (ref 5–15)
Anion gap: 10 (ref 5–15)
BUN: 37 mg/dL — AB (ref 6–20)
BUN: 36 mg/dL — ABNORMAL HIGH (ref 6–20)
CALCIUM: 9.1 mg/dL (ref 8.9–10.3)
CHLORIDE: 101 mmol/L (ref 101–111)
CO2: 29 mmol/L (ref 22–32)
CO2: 27 mmol/L (ref 22–32)
CREATININE: 1.4 mg/dL — AB (ref 0.61–1.24)
Calcium: 9.1 mg/dL (ref 8.9–10.3)
Chloride: 101 mmol/L (ref 101–111)
Creatinine, Ser: 1.42 mg/dL — ABNORMAL HIGH (ref 0.61–1.24)
GFR calc non Af Amer: 53 mL/min — ABNORMAL LOW (ref >60)
GFR, EST NON AFRICAN AMERICAN: 52 mL/min — AB (ref >60)
Glucose, Bld: 187 mg/dL — ABNORMAL HIGH (ref 65–99)
Glucose, Bld: 244 mg/dL — ABNORMAL HIGH (ref 65–99)
Potassium: 3.9 mmol/L (ref 3.5–5.1)
Potassium: 3.3 mmol/L — ABNORMAL LOW (ref 3.5–5.1)
Sodium: 138 mmol/L (ref 135–145)
Sodium: 138 mmol/L (ref 135–145)

## 2016-05-23 LAB — CBC
HCT: 44.2 % (ref 40.0–52.0)
Hemoglobin: 14 g/dL (ref 13.0–18.0)
MCH: 28.8 pg (ref 26.0–34.0)
MCHC: 31.5 g/dL — AB (ref 32.0–36.0)
MCV: 91.4 fL (ref 80.0–100.0)
Platelets: 128 10*3/uL — ABNORMAL LOW (ref 150–440)
RBC: 4.84 MIL/uL (ref 4.40–5.90)
RDW: 17.7 % — AB (ref 11.5–14.5)
WBC: 12.3 10*3/uL — ABNORMAL HIGH (ref 3.8–10.6)

## 2016-05-23 LAB — BLOOD GAS, ARTERIAL
ACID-BASE DEFICIT: 6.5 mmol/L — AB (ref 0.0–2.0)
ALLENS TEST (PASS/FAIL): POSITIVE — AB
Bicarbonate: 20.2 mEq/L — ABNORMAL LOW (ref 21.0–28.0)
FIO2: 0.6
MECHVT: 500 mL
O2 SAT: 99.6 %
PATIENT TEMPERATURE: 37
PCO2 ART: 44 mmHg (ref 32.0–48.0)
PEEP: 5 cmH2O
PH ART: 7.27 — AB (ref 7.350–7.450)
PO2 ART: 192 mmHg — AB (ref 83.0–108.0)
RATE: 20 resp/min

## 2016-05-23 LAB — GLUCOSE, CAPILLARY
GLUCOSE-CAPILLARY: 200 mg/dL — AB (ref 65–99)
Glucose-Capillary: 178 mg/dL — ABNORMAL HIGH (ref 65–99)
Glucose-Capillary: 152 mg/dL — ABNORMAL HIGH (ref 65–99)
Glucose-Capillary: 159 mg/dL — ABNORMAL HIGH (ref 65–99)

## 2016-05-23 LAB — MAGNESIUM: Magnesium: 2.3 mg/dL (ref 1.7–2.4)

## 2016-05-23 LAB — TROPONIN I
TROPONIN I: 0.06 ng/mL — AB (ref <0.03)
Troponin I: 14.74 ng/mL (ref <0.03)

## 2016-05-23 LAB — APTT
APTT: 46 s — AB (ref 24–36)
APTT: 33 s (ref 24–36)

## 2016-05-23 LAB — BRAIN NATRIURETIC PEPTIDE: B Natriuretic Peptide: 1673 pg/mL — ABNORMAL HIGH (ref 0.0–100.0)

## 2016-05-23 MED ORDER — FAMOTIDINE IN NACL 20-0.9 MG/50ML-% IV SOLN
20.0000 mg | Freq: Two times a day (BID) | INTRAVENOUS | Status: DC
  Filled 2016-05-23 (×2): qty 50

## 2016-05-23 MED ORDER — HALOPERIDOL LACTATE 5 MG/ML IJ SOLN: 0.5000 mg | INTRAMUSCULAR | Status: DC | PRN

## 2016-05-23 MED ORDER — FENTANYL CITRATE (PF) 100 MCG/2ML IJ SOLN: 100.0000 ug | INTRAMUSCULAR | Status: DC | PRN

## 2016-05-23 MED ORDER — SODIUM CHLORIDE 0.9 % IV SOLN
INTRAVENOUS | Status: DC
  Administered 2016-05-23: 20:00:00 via INTRAVENOUS

## 2016-05-23 MED ORDER — HEPARIN BOLUS VIA INFUSION
4000.0000 [IU] | Freq: Once | INTRAVENOUS | Status: AC
  Administered 2016-05-23: 4000 [IU] via INTRAVENOUS
  Filled 2016-05-23: qty 4000

## 2016-05-23 MED ORDER — MIDAZOLAM HCL 2 MG/2ML IJ SOLN: 2.0000 mg | INTRAMUSCULAR | Status: DC | PRN

## 2016-05-23 MED ORDER — MORPHINE 100MG IN NS 100ML (1MG/ML) PREMIX INFUSION
5.0000 mg/h | INTRAVENOUS | Status: DC
  Administered 2016-05-23: 5 mg/h via INTRAVENOUS
  Filled 2016-05-23: qty 100

## 2016-05-23 MED ORDER — FENTANYL BOLUS VIA INFUSION
50.0000 ug | INTRAVENOUS | Status: DC | PRN
  Filled 2016-05-23: qty 50

## 2016-05-23 MED ORDER — CISATRACURIUM BESYLATE (PF) 200 MG/20ML IV SOLN
1.0000 ug/kg/min | INTRAVENOUS | Status: DC
  Administered 2016-05-23: 1 ug/kg/min via INTRAVENOUS
  Filled 2016-05-23: qty 20

## 2016-05-23 MED ORDER — NOREPINEPHRINE BITARTRATE 1 MG/ML IV SOLN: 0.0000 ug/min | Freq: Once | INTRAVENOUS | Status: DC

## 2016-05-23 MED ORDER — FENTANYL 2500MCG IN NS 250ML (10MCG/ML) PREMIX INFUSION
25.0000 ug/h | INTRAVENOUS | Status: DC
  Administered 2016-05-23: 50 ug/h via INTRAVENOUS
  Filled 2016-05-23: qty 250

## 2016-05-23 MED ORDER — GLYCOPYRROLATE 0.2 MG/ML IJ SOLN: 0.2000 mg | INTRAMUSCULAR | Status: DC | PRN

## 2016-05-23 MED ORDER — HALOPERIDOL 0.5 MG PO TABS: 0.5000 mg | ORAL_TABLET | ORAL | Status: DC | PRN

## 2016-05-23 MED ORDER — SODIUM CHLORIDE 0.9 % IV SOLN: 2000.0000 mL | Freq: Once | INTRAVENOUS | Status: DC

## 2016-05-23 MED ORDER — NITROGLYCERIN IN D5W 200-5 MCG/ML-% IV SOLN
INTRAVENOUS | Status: AC
  Administered 2016-05-23: 5 ug via INTRAVENOUS
  Filled 2016-05-23: qty 250

## 2016-05-23 MED ORDER — BIOTENE DRY MOUTH MT LIQD: 15.0000 mL | OROMUCOSAL | Status: DC | PRN

## 2016-05-23 MED ORDER — PROPOFOL 1000 MG/100ML IV EMUL
INTRAVENOUS | Status: AC
  Administered 2016-05-23: 20 ug/kg/min via INTRAVENOUS
  Filled 2016-05-23: qty 100

## 2016-05-23 MED ORDER — CISATRACURIUM BOLUS VIA INFUSION
0.0500 mg/kg | INTRAVENOUS | Status: DC | PRN
  Filled 2016-05-23: qty 5

## 2016-05-23 MED ORDER — INSULIN REGULAR HUMAN 100 UNIT/ML IJ SOLN
INTRAMUSCULAR | Status: DC
  Administered 2016-05-23: 0.9 [IU]/h via INTRAVENOUS
  Filled 2016-05-23: qty 2.5

## 2016-05-23 MED ORDER — INSULIN ASPART 100 UNIT/ML ~~LOC~~ SOLN: 2.0000 [IU] | SUBCUTANEOUS | Status: DC

## 2016-05-23 MED ORDER — FENTANYL CITRATE (PF) 100 MCG/2ML IJ SOLN
50.0000 ug | Freq: Once | INTRAMUSCULAR | Status: AC
  Administered 2016-05-23: 50 ug via INTRAVENOUS

## 2016-05-23 MED ORDER — ASPIRIN 300 MG RE SUPP: 300.0000 mg | RECTAL | Status: DC

## 2016-05-23 MED ORDER — SODIUM CHLORIDE 0.9 % IV SOLN
0.0000 mg/h | INTRAVENOUS | Status: DC
  Administered 2016-05-23: 2 mg/h via INTRAVENOUS
  Filled 2016-05-23: qty 10

## 2016-05-23 MED ORDER — CALCIUM CHLORIDE 10 % IV SOLN
INTRAVENOUS | Status: AC | PRN
Start: 2016-05-23 — End: 2016-05-23
  Administered 2016-05-23: 1 g via INTRAVENOUS

## 2016-05-23 MED ORDER — NOREPINEPHRINE 4 MG/250ML-% IV SOLN
INTRAVENOUS | Status: AC
  Administered 2016-05-23: 5 ug/min
  Filled 2016-05-23: qty 250

## 2016-05-23 MED ORDER — CISATRACURIUM BOLUS VIA INFUSION
0.1000 mg/kg | Freq: Once | INTRAVENOUS | Status: AC
  Administered 2016-05-23: 10 mg via INTRAVENOUS
  Filled 2016-05-23: qty 10

## 2016-05-23 MED ORDER — GLYCOPYRROLATE 1 MG PO TABS
1.0000 mg | ORAL_TABLET | ORAL | Status: DC | PRN
  Filled 2016-05-23: qty 1

## 2016-05-23 MED ORDER — MAGNESIUM SULFATE 2 GM/50ML IV SOLN
2.0000 g | Freq: Once | INTRAVENOUS | Status: AC
  Administered 2016-05-23: 2 g via INTRAVENOUS

## 2016-05-23 MED ORDER — SODIUM CHLORIDE 0.9 % IV BOLUS (SEPSIS)
500.0000 mL | Freq: Once | INTRAVENOUS | Status: AC
  Administered 2016-05-23: 500 mL via INTRAVENOUS

## 2016-05-23 MED ORDER — ONDANSETRON 4 MG PO TBDP
4.0000 mg | ORAL_TABLET | Freq: Four times a day (QID) | ORAL | Status: DC | PRN
  Filled 2016-05-23: qty 1

## 2016-05-23 MED ORDER — MIDAZOLAM HCL 2 MG/2ML IJ SOLN
2.0000 mg | INTRAMUSCULAR | Status: DC | PRN
  Administered 2016-05-23: 2 mg via INTRAVENOUS
  Filled 2016-05-23: qty 2

## 2016-05-23 MED ORDER — HALOPERIDOL LACTATE 2 MG/ML PO CONC: 0.5000 mg | ORAL | Status: DC | PRN

## 2016-05-23 MED ORDER — MIDAZOLAM HCL 5 MG/5ML IJ SOLN
1.0000 mg | Freq: Once | INTRAMUSCULAR | Status: AC
  Administered 2016-05-23: 1 mg via INTRAVENOUS

## 2016-05-23 MED ORDER — ARTIFICIAL TEARS OP OINT
1.0000 "application " | TOPICAL_OINTMENT | Freq: Three times a day (TID) | OPHTHALMIC | Status: DC
  Administered 2016-05-23: 1 via OPHTHALMIC
  Filled 2016-05-23: qty 3.5

## 2016-05-23 MED ORDER — FENTANYL CITRATE (PF) 100 MCG/2ML IJ SOLN
100.0000 ug | INTRAMUSCULAR | Status: DC | PRN
  Filled 2016-05-23: qty 2

## 2016-05-23 MED ORDER — HEPARIN (PORCINE) IN NACL 100-0.45 UNIT/ML-% IJ SOLN
1000.0000 [IU]/h | INTRAMUSCULAR | Status: DC
  Administered 2016-05-23: 1000 [IU]/h via INTRAVENOUS
  Filled 2016-05-23: qty 250

## 2016-05-23 MED ORDER — FENTANYL CITRATE (PF) 100 MCG/2ML IJ SOLN: 100.0000 ug | Freq: Once | INTRAMUSCULAR | Status: DC

## 2016-05-23 MED ORDER — ONDANSETRON HCL 4 MG/2ML IJ SOLN: 4.0000 mg | Freq: Four times a day (QID) | INTRAMUSCULAR | Status: DC | PRN

## 2016-05-23 MED ORDER — PROPOFOL 1000 MG/100ML IV EMUL
5.0000 ug/kg/min | Freq: Once | INTRAVENOUS | Status: DC
  Administered 2016-05-23: 20 ug/kg/min via INTRAVENOUS

## 2016-05-23 MED ORDER — NITROGLYCERIN IN D5W 200-5 MCG/ML-% IV SOLN
0.0000 ug/min | INTRAVENOUS | Status: DC
  Administered 2016-05-23: 5 ug via INTRAVENOUS

## 2016-05-23 MED ORDER — NOREPINEPHRINE 4 MG/250ML-% IV SOLN: 0.0000 ug/min | INTRAVENOUS | Status: DC

## 2016-05-23 MED ORDER — EPINEPHRINE HCL 0.1 MG/ML IJ SOSY
PREFILLED_SYRINGE | INTRAMUSCULAR | Status: AC | PRN
  Administered 2016-05-23 (×2): 1 via INTRAVENOUS

## 2016-05-23 MED ORDER — HEPARIN SODIUM (PORCINE) 5000 UNIT/ML IJ SOLN: 5000.0000 [IU] | Freq: Three times a day (TID) | INTRAMUSCULAR | Status: DC

## 2016-05-23 MED ORDER — PROPOFOL 1000 MG/100ML IV EMUL: 5.0000 ug/kg/min | INTRAVENOUS | Status: DC

## 2016-05-23 MED ORDER — POLYVINYL ALCOHOL 1.4 % OP SOLN
1.0000 [drp] | Freq: Four times a day (QID) | OPHTHALMIC | Status: DC | PRN
  Filled 2016-05-23: qty 15

## 2016-05-23 MED ORDER — ASPIRIN 81 MG PO CHEW: 324.0000 mg | CHEWABLE_TABLET | ORAL | Status: DC

## 2016-05-23 MED ORDER — SODIUM CHLORIDE 0.9 % IV SOLN: 250.0000 mL | INTRAVENOUS | Status: DC | PRN

## 2016-05-23 MED ORDER — MAGNESIUM SULFATE 2 GM/50ML IV SOLN
INTRAVENOUS | Status: AC
  Filled 2016-05-23: qty 50

## 2016-05-24 MED FILL — Medication: Qty: 1 | Status: AC

## 2016-06-13 NOTE — ED Provider Notes (Signed)
[facility] Emergency Department Provider Note  ____________________________________________  Time seen: Approximately 4:09 PM  I have reviewed the triage vital signs and the nursing notes.   HISTORY  Chief Complaint No chief complaint on file.   HPI [name] is a 60 y.o. male history as listed below, presents in cardiac arrest. Found down, unknown duration. Initial rhythm asystole. Left IO placed. Patient was coded for 25 minutes with 2 rounds of epinephrine, 2 g of magnesium, 1 g of calcium chloride with ROSC. He was intubated for airway protection and ventilation. The patient had pupillary response after ROSC, but no gag, no cough, no other purposeful movement. Initial EKG showing ST depressions diffusely were no ST elevations. Patient was started on propofol for sedation. Chest x-ray after ROSC showing appropriately placed ET tube.     Past Medical History  Diagnosis Date  . HTN (hypertension)   . Diabetic foot ulcers (HCC) 10/2011    bilateral plantar 1st MTP ulcers, with deep tissue infection right foot 10/2011   . Anxiety   . Headache(784.0)   . NSTEMI (non-ST elevated myocardial infarction) (HCC) 10/17/2011  . Diabetes mellitus   . Pulmonary emphysema (HCC)   . Obesity   . Paroxysmal atrial flutter (HCC) 10/15/2011  . CAD (coronary artery disease), autologous vein bypass graft     s/p CABG in 80's or 90's, multiple caths/PCI. Presented in June of 2014 with non-ST elevation myocardial infarction. Cardiac catheterization done by Dr. Fransico Johntavius at [facility] showed severe three-vessel coronary artery disease (nondominant RCA )with patent LIMA to LAD, occluded SVG to OM1(new) and occluded SVG to PDA. There was significant ostial and distal left main stenosis. Attempted left main/LCX PCI  . MI (myocardial infarction) (HCC)   . Chronic systolic heart failure (HCC)     Ejection fraction 35%  . Broken femur (HCC)   . CHF (congestive heart failure) (HCC)   .  Major depression (HCC) unk  . PVD (peripheral vascular disease) (HCC) unk  . Rhabdomyolysis   . Atrial fibrillation (HCC)   . Dementia   . Anginal pain (HCC)   . Encephalopathy   . Cellulitis     Patient Active Problem List   Diagnosis Date Noted  . Cardiopulmonary arrest (HCC) 05-26-2016  . GI bleed 03/22/2015  . Chronic indwelling Foley catheter   . Atrial fibrillation (HCC) 07/24/2014  . Hyperlipidemia 01/24/2014  . Chronic systolic heart failure (HCC)   . Paroxysmal atrial flutter (HCC) 05/20/2013  . Arteriosclerosis of coronary artery 04/21/2013  . Diabetes (HCC) 04/21/2013  . S/P BKA (below knee amputation) unilateral (HCC) 11/15/2011  . Weakness generalized 11/13/2011  . NSTEMI (non-ST elevated myocardial infarction) (HCC) 10/17/2011  . Soft tissue infection of foot 10/15/2011  . Atrial flutter with rapid ventricular response (HCC) 10/15/2011  . CAD (coronary artery disease), autologous vein bypass graft   . HTN (hypertension)   . Diabetes mellitus   . Diabetic foot ulcers (HCC) 10/14/2011    Past Surgical History  Procedure Laterality Date  . Coronary artery bypass graft    . No past surgeries    . Amputation  10/17/2011    Procedure: AMPUTATION RAY;  Surgeon: Cammy Copa;  Location: [facility] OR;  Service: Orthopedics;  Laterality: Right;  First and Second Ray Amputation  . Amputation  11/01/2011    Procedure: AMPUTATION BELOW KNEE;  Surgeon: Nadara Mustard, MD;  Location: [facility] OR;  Service: Orthopedics;  Laterality: Right;  Right Below Knee Amputation  .  Rt bka    . Cardiac catheterization  09/2006    [facility];EF 60%  . Cardiac catheterization  05/30/13    [facility]: Occluded SVG to RCA and SVG to OM (both chronic) , patent LIMA to LAD which gives collaterals to LCx and RCA. Subtotal occlusion of the native left circumflex at the site of PCI done at [facility]. Ejection fraction 55%  . Coronary angioplasty    . Vascular surgery    . Peripheral vascular catheterization Left  06/22/2015    Procedure: Lower Extremity Angiography;  Surgeon: Renford Dills, MD;  Location: [facility] INVASIVE CV LAB;  Service: Cardiovascular;  Laterality: Left;  . Peripheral vascular catheterization  06/22/2015    Procedure: Lower Extremity Intervention;  Surgeon: Renford Dills, MD;  Location: [facility] INVASIVE CV LAB;  Service: Cardiovascular;;  . Peripheral vascular catheterization N/A 12/07/2015    Procedure: Abdominal Aortogram w/Lower Extremity;  Surgeon: Renford Dills, MD;  Location: [facility] INVASIVE CV LAB;  Service: Cardiovascular;  Laterality: N/A;  . Peripheral vascular catheterization  12/07/2015    Procedure: Lower Extremity Intervention;  Surgeon: Renford Dills, MD;  Location: [facility] INVASIVE CV LAB;  Service: Cardiovascular;;    Current Outpatient Rx  Name  Route  Sig  Dispense  Refill  . acetaminophen (TYLENOL) 325 MG tablet   Oral   Take 650 mg by mouth every 6 (six) hours as needed for mild pain.         Marland Kitchen aspirin EC 81 MG tablet   Oral   Take 1 tablet (81 mg total) by mouth daily.   90 tablet   3   . carvedilol (COREG) 3.125 MG tablet   Oral   Take 1 tablet (3.125 mg total) by mouth 2 (two) times daily.   60 tablet   3   . cetirizine (ZYRTEC) 10 MG tablet   Oral   Take 10 mg by mouth daily as needed.          . clopidogrel (PLAVIX) 75 MG tablet   Oral   Take 1 tablet (75 mg total) by mouth daily.   30 tablet   5   . furosemide (LASIX) 20 MG tablet   Oral   Take 20 mg by mouth daily.         Marland Kitchen gabapentin (NEURONTIN) 300 MG capsule   Oral   Take 300 mg by mouth 3 (three) times daily.         Marland Kitchen guaiFENesin (ROBITUSSIN) 100 MG/5ML SOLN   Oral   Take 20 mLs by mouth every 6 (six) hours as needed for cough or to loosen phlegm.         . insulin aspart (NOVOLOG) 100 UNIT/ML injection   Subcutaneous   Inject 0-15 Units into the skin 3 (three) times daily with meals.   10 mL   11   . insulin detemir (LEVEMIR) 100 UNIT/ML injection    Subcutaneous   Inject 0.23 mLs (23 Units total) into the skin daily.   10 mL   11   . lisinopril (PRINIVIL,ZESTRIL) 2.5 MG tablet   Oral   Take 2.5 mg by mouth daily.         . magnesium hydroxide (MILK OF MAGNESIA) 800 MG/5ML suspension   Oral   Take 30 mLs by mouth daily as needed for constipation.         . nitroGLYCERIN (NITROSTAT) 0.4 MG SL tablet   Sublingual   Place 1 tablet (0.4 mg total) under the  tongue every 5 (five) minutes as needed for chest pain.   25 tablet   3   . oxycodone (OXY-IR) 5 MG capsule   Oral   Take 5 mg by mouth every 6 (six) hours as needed for pain. And give 1 tab q12hrs for pain         . sennosides-docusate sodium (SENOKOT-S) 8.6-50 MG tablet   Oral   Take 2 tablets by mouth 2 (two) times daily.         . sertraline (ZOLOFT) 100 MG tablet   Oral   Take 100 mg by mouth daily.         Marland Kitchen. talc (ZEASORB) powder   Topical   Apply 1 application topically every 8 (eight) hours as needed.         . simvastatin (ZOCOR) 10 MG tablet   Oral   Take 10 mg by mouth at bedtime.           Allergies Review of patient's allergies indicates no known allergies.  Family History  Problem Relation Age of Onset  . Alzheimer's disease Father   . Melanoma Father   . Benign prostatic hyperplasia Father   . Coronary artery disease Father     s/p CABG  . Heart attack Brother 57    MI  . Coronary artery disease Brother     s/p CABG  . Lymphoma Sister     non Hodgkin's   . Aortic aneurysm Mother   . Anemia Mother     Social History Social History  Substance Use Topics  . Smoking status: Former Smoker -- 0.50 packs/day for 35 years    Types: Cigarettes    Quit date: 07/02/2001  . Smokeless tobacco: Never Used  . Alcohol Use: No    Review of Systems Unable to obtain ____________________________________________   PHYSICAL EXAM:  VITAL SIGNS: HR 61, BP 140/51 100%   Constitutional: GCS 3 HEENT:      Head: Normocephalic and  atraumatic.         Eyes: Conjunctivae are normal. Sclera is non-icteric. EOMI. PERRL      Neck: Supple with no evidence of trauma Cardiovascular: Wide complex tachycardia Respiratory: Agonal respirations Gastrointestinal: Soft, non distended Musculoskeletal: No edema. Neurologic: GCS 3 Skin: Skin is warm, dry and intact. No rash noted.   ____________________________________________   LABS (all labs ordered are listed, but only abnormal results are displayed)  Labs Reviewed  GLUCOSE, CAPILLARY - Abnormal; Notable for the following:    Glucose-Capillary 200 (*)    All other components within normal limits  CBC - Abnormal; Notable for the following:    WBC 12.3 (*)    MCHC 31.5 (*)    RDW 17.7 (*)    Platelets 128 (*)    All other components within normal limits  COMPREHENSIVE METABOLIC PANEL - Abnormal; Notable for the following:    Chloride 100 (*)    CO2 21 (*)    Glucose, Bld 226 (*)    BUN 34 (*)    Creatinine, Ser 1.50 (*)    Calcium 10.9 (*)    AST 82 (*)    GFR calc non Af Amer 49 (*)    GFR calc Af Amer 56 (*)    Anion gap 17 (*)    All other components within normal limits  TROPONIN I - Abnormal; Notable for the following:    Troponin I 0.06 (*)    All other components within normal limits  BRAIN NATRIURETIC  PEPTIDE - Abnormal; Notable for the following:    B Natriuretic Peptide 1673.0 (*)    All other components within normal limits  APTT - Abnormal; Notable for the following:    aPTT 46 (*)    All other components within normal limits  PROTIME-INR - Abnormal; Notable for the following:    Prothrombin Time 15.8 (*)    All other components within normal limits  MAGNESIUM  TROPONIN I  TROPONIN I  TROPONIN I   ____________________________________________  EKG  ED ECG REPORT I, Nita Sickle, the attending physician, personally viewed and interpreted this ECG.   Sinus tachycardia, rate of 113, diffuse ST depressions inferior and lateral leads,  no ST elevation.  ____________________________________________  RADIOLOGY  CXR: No infiltrate, probably positioned ET tube ____________________________________________   PROCEDURES  Procedure(s) performed:yes  Intubation INTUBATION Performed by: Nita Sickle  Required items: required blood products, implants, devices, and special equipment available Patient identity confirmed: provided demographic data and hospital-assigned identification number Time out: Immediately prior to procedure a "time out" was called to verify the correct patient, procedure, equipment, support staff and site/side marked as required.  Indications: Cardiac:   Intubation method: Glidescope Laryngoscopy   Preoxygenation: BVM  Sedatives: None  Paralytic: None   Tube Size: 8.0 cuffed  Post-procedure assessment: chest rise and ETCO2 monitor Breath sounds: equal and absent over the epigastrium Tube secured with: ETT holder Chest x-ray interpreted by radiologist and me.  Chest x-ray findings: endotracheal tube in appropriate position  Patient tolerated the procedure well with no immediate complications.    IO IV   Intraosseous line placed on the left tibia. Indication: Cardiac arrest Blood drawn back, easily flushed. Patient tolerated with no complications   Critical Care performed: Yes  CRITICAL CARE Performed by: Nita Sickle  ?  Total critical care time: 45 min  Critical care time was exclusive of separately billable procedures and treating other patients.  Critical care was necessary to treat or prevent imminent or life-threatening deterioration.  Critical care was time spent personally by me on the following activities: development of treatment plan with patient and/or surrogate as well as nursing, discussions with consultants, evaluation of patient's response to treatment, examination of patient, obtaining history from patient or surrogate, ordering and performing  treatments and interventions, ordering and review of laboratory studies, ordering and review of radiographic studies, pulse oximetry and re-evaluation of patient's condition.  ____________________________________________   INITIAL IMPRESSION / ASSESSMENT AND PLAN / ED COURSE  60 y.o. male history as listed below, presents in cardiac arrest. Initial rhythm asystole. Left IO placed. Patient was coded for 25 minutes with 2 rounds of epinephrine, 2 g of magnesium, 1 g of calcium chloride with ROSC by ACLS standards. He was intubated for airway protection and ventilation. The patient had pupillary response afterROSC, but no gag, no cough, no other purposeful movement. Initial EKG showing ST depressions diffusely with no ST elevations. Patient was started on propofol for sedation. Chest x-ray after ROSC showing appropriately placed ET tube. Patient remained GCS of 3. Discussed with the intensivist for admission. Discussed with Dr. Janann Colonel cardiology who recommended admission to the ICU but no cath at this time.    Pertinent labs & imaging results that were available during my care of the patient were reviewed by me and considered in my medical decision making (see chart for details).    ____________________________________________   FINAL CLINICAL IMPRESSION(S) / ED DIAGNOSES  Final diagnoses:  Cardiac arrest (HCC)  NEW MEDICATIONS STARTED DURING THIS VISIT:  New Prescriptions   No medications on file     Note:  This document was prepared using Dragon voice recognition software and may include unintentional dictation errors.    Nita Sickle, MD May 25, 2016 [phone]

## 2016-06-13 NOTE — Progress Notes (Signed)
   Patient was found down for an unknown duration of time. Apparently, he was in asystole pon attaching the cardiac monitor. He was coded for 25 minutes, received 2 rounds of epinephrine, 2 g magnesium, and 1 g of calcium chloride with ROSC. He is known to have occluded vein grafts with only a patent LIMA-LAD. Given the above presentation it is unclear if this was ACS vs PE vs alternative etiology. Initial troponin 0.06 in the setting of the above. Continue to trend. With his unknown down time and PMH when/if he has any meaningful recovery would plan for cardiac cath at that time. Start heparin gtt. Case was discussed with Dr. Kirke CorinArida, MD who agrees with the above.   Full consult to follow on 05/24/2016.    Tyler Listenyan [name], PA-C 09/13/16 5:15 PM

## 2016-06-13 NOTE — H&P (Signed)
PULMONARY / CRITICAL CARE MEDICINE   Name: [name] MRN: [id number] DOB: 31-Aug-1956    ADMISSION DATE:  Jun 13, 2016 CONSULTATION DATE: Jun 13, 2016  REFERRING MD:Dr. Don Perking  CHIEF COMPLAINT: Cardiac Arrest - Asystole/PEA  HISTORY OF PRESENT ILLNESS:   [name] is a 60 year old male patient who is a resident of [facility] with past medical history significant for hypertension, diabetes, NSTEMI, obesity, paroxysmal atrial flutter, coronary artery disease, MI, major depression, atrial fibrillation. Patient went to see a movie with his friend. Patient wanted to have something to eat. During that time patient started complaining of shortness of breath therefore his friend got him oxygen. His friend states that during that time he was still breathing but he was not talking therefore his friend decided to bring him to the hospital. As per the friend he was still breathing when he pulled in front of the ED. Patient was pulled out of the vehicle as he was not breathing, unresponsive and had no pulse. CPR was started in the triage and was brought in the ER. As per the ED doc patient was coded for about for about 25 minutes with 2 rounds of epinephrine, 2 g of magnesium, 1 g of calcium chloride with return of spontaneous circulation. He was intubated For airway protection. Initial EKG was showing ST depression diffusely, no ST elevation noted. [facility] M team was called to admit the patient.  PAST MEDICAL HISTORY :  He  has a past medical history of HTN (hypertension); Diabetic foot ulcers (HCC) (10/2011); Anxiety; Headache(784.0); NSTEMI (non-ST elevated myocardial infarction) (HCC) (10/17/2011); Diabetes mellitus; Pulmonary emphysema (HCC); Obesity; Paroxysmal atrial flutter (HCC) (10/15/2011); CAD (coronary artery disease), autologous vein bypass graft; MI (myocardial infarction) (HCC); Chronic systolic heart failure (HCC); Broken femur (HCC); CHF (congestive heart failure) (HCC); Major  depression (HCC) (unk); PVD (peripheral vascular disease) (HCC) (unk); Rhabdomyolysis; Atrial fibrillation (HCC); Dementia; Anginal pain (HCC); Encephalopathy; and Cellulitis.  PAST SURGICAL HISTORY: He  has past surgical history that includes Coronary artery bypass graft; No past surgeries; Amputation (10/17/2011); Amputation (11/01/2011); rt bka; Cardiac catheterization (09/2006); Cardiac catheterization (05/30/13); Coronary angioplasty; Vascular surgery; Cardiac catheterization (Left, 06/22/2015); Cardiac catheterization (06/22/2015); Cardiac catheterization (N/A, 12/07/2015); and Cardiac catheterization (12/07/2015).  No Known Allergies  No current facility-administered medications on file prior to encounter.   Current Outpatient Prescriptions on File Prior to Encounter  Medication Sig  . acetaminophen (TYLENOL) 325 MG tablet Take 650 mg by mouth every 6 (six) hours as needed for mild pain.  Marland Kitchen aspirin EC 81 MG tablet Take 1 tablet (81 mg total) by mouth daily.  . carvedilol (COREG) 3.125 MG tablet Take 1 tablet (3.125 mg total) by mouth 2 (two) times daily.  . cetirizine (ZYRTEC) 10 MG tablet Take 10 mg by mouth daily as needed.   . clopidogrel (PLAVIX) 75 MG tablet Take 1 tablet (75 mg total) by mouth daily.  . furosemide (LASIX) 20 MG tablet Take 20 mg by mouth daily.  Marland Kitchen gabapentin (NEURONTIN) 300 MG capsule Take 300 mg by mouth 3 (three) times daily.  Marland Kitchen guaiFENesin (ROBITUSSIN) 100 MG/5ML SOLN Take 20 mLs by mouth every 6 (six) hours as needed for cough or to loosen phlegm.  . insulin aspart (NOVOLOG) 100 UNIT/ML injection Inject 0-15 Units into the skin 3 (three) times daily with meals.  . insulin detemir (LEVEMIR) 100 UNIT/ML injection Inject 0.23 mLs (23 Units total) into the skin daily.  Marland Kitchen lisinopril (PRINIVIL,ZESTRIL) 2.5 MG tablet Take 2.5 mg by mouth daily.  Marland Kitchen  magnesium hydroxide (MILK OF MAGNESIA) 800 MG/5ML suspension Take 30 mLs by mouth daily as needed for constipation.  .  nitroGLYCERIN (NITROSTAT) 0.4 MG SL tablet Place 1 tablet (0.4 mg total) under the tongue every 5 (five) minutes as needed for chest pain.  Marland Kitchen oxycodone (OXY-IR) 5 MG capsule Take 5 mg by mouth every 6 (six) hours as needed for pain. And give 1 tab q12hrs for pain  . sennosides-docusate sodium (SENOKOT-S) 8.6-50 MG tablet Take 2 tablets by mouth 2 (two) times daily.  . sertraline (ZOLOFT) 100 MG tablet Take 100 mg by mouth daily.  Marland Kitchen talc (ZEASORB) powder Apply 1 application topically every 8 (eight) hours as needed.  . simvastatin (ZOCOR) 10 MG tablet Take 10 mg by mouth at bedtime.    FAMILY HISTORY:  His has no family status information on file.   SOCIAL HISTORY: He  reports that he quit smoking about 14 years ago. His smoking use included Cigarettes. He has a 17.5 pack-year smoking history. He has never used smokeless tobacco. He reports that he does not drink alcohol or use illicit drugs.  REVIEW OF SYSTEMS:   Unable to obtain  SUBJECTIVE Unable to obtain  VITAL SIGNS: BP 133/57 mmHg  Pulse 70  Resp 17  Ht 6' (1.829 m)  Wt 99.791 kg (220 lb)  BMI 29.83 kg/m2  SpO2 92%  HEMODYNAMICS:    VENTILATOR SETTINGS: Vent Mode:  [-] AC FiO2 (%):  [40 %-100 %] 40 % Set Rate:  [20 bmp] 20 bmp Vt Set:  [500 mL] 500 mL PEEP:  [5 cmH20] 5 cmH20  INTAKE / OUTPUT:    PHYSICAL EXAMINATION: General:  Morbidly obese white male, found to be intubated and mechanically ventilated Neuro:  Obtunded HEENT: Atraumatic, normocephalic, pupils sluggishly reacting no discharge Cardiovascular:  S1 and S2, regular, no MRG noted Lungs:  clear bilaterally, no wheezes, crackles, rhonchi noted Abdomen:   obese, round, no guarding Musculoskeletal:  Skin:  Grossly intact  LABS:  BMET  Recent Labs Lab 05-29-16 1559  NA 138  K 4.1  CL 100*  CO2 21*  BUN 34*  CREATININE 1.50*  GLUCOSE 226*    Electrolytes  Recent Labs Lab May 29, 2016 1559  CALCIUM 10.9*  MG 2.3    CBC  Recent  Labs Lab 2016/05/29 1559  WBC 12.3*  HGB 14.0  HCT 44.2  PLT 128*    Coag's  Recent Labs Lab May 29, 2016 1559  APTT 46*  INR 1.24    Sepsis Markers No results for input(s): LATICACIDVEN, PROCALCITON, O2SATVEN in the last 168 hours.  ABG  Recent Labs Lab 2016/05/29 1641  PHART 7.27*  PCO2ART 44  PO2ART 192*    Liver Enzymes  Recent Labs Lab 05/29/16 1559  AST 82*  ALT 50  ALKPHOS 108  BILITOT 0.8  ALBUMIN 3.5    Cardiac Enzymes  Recent Labs Lab 05-29-2016 1559  TROPONINI 0.06*    Glucose  Recent Labs Lab 05/29/2016 1555  GLUCAP 200*    Imaging Dg Chest Portable 1 View  May 29, 2016  CLINICAL DATA:  Post CPR and intubation EXAM: PORTABLE CHEST 1 VIEW COMPARISON:  Portable exam 1603 hours compared to 03/21/2015 FINDINGS: Tip of endotracheal tube projects approximately 3.6 cm above carina. External pacing leads project over chest. Enlargement of cardiac silhouette post CABG. BILATERAL infiltrates question pulmonary edema. No definite pleural effusion or pneumothorax. Bones appear demineralized. IMPRESSION: Enlargement of cardiac silhouette post CABG with suspected mild pulmonary edema. Electronically Signed   By: Loraine Leriche  Tyron Russell M.D.   On: 06/10/16 16:17     STUDIES:  7/11 CT head>>  CULTURES: none  ANTIBIOTICS: none  SIGNIFICANT EVENTS: 7/11 patient had PEA cardiac arrest and now admitted to the ICU,mechanically ventilated, now getting cooled.  LINES/TUBES: 7/11  CVL  DISCUSSION: 60 Year old with multiple co-morbidities now presenting with PEA cardiac arrest.  Now on hypothermia protocol.  ASSESSMENT / PLAN:  PULMONARY A: Acute respiratory failure H/o PE P:   Full vent support, do not wean Fentanyl/ versed Routine ABG CXR in am  CARDIOVASCULAR A:  PEA cardiac arrest shock H/o CHF Paroxysmal atrial fibrillation H/o MI H/O  Elevated BNP P:  Follow serial troponins Levophed Follow echo Trend BNP Hold coreg  RENAL A:   Acute  kidney injury P:   Follow chemistry  GASTROINTESTINAL A:   No active issues P:   Npo now wil  HEMATOLOGIC A:   No active issues P:  Transfuse if Hgb<7 Follow chemistry  INFECTIOUS A:   No active issues P:   Cooling protocol placed Monitor fever curve PAN culture iwhen febrile Follow CBC  ENDOCRINE A:   Diabetes Melitus P:   Blood sugar checks with BMP  NEUROLOGIC A:   ?Hypoxic brain injury  Hx of anxiety, dementia  P:   RASS goal: -3 Cooling protocol placed, will re-warm after 24 hours Follow CT head>>no acute abnormalities Hold zoloft     Bincy Varughese,AG-ACNP Pulmonary and Critical Care Medicine [facility] Pager: [phone]  06/10/16, 5:37 PM  STAFF NOTE: I, Dr. Stephanie Acre have personally reviewed patient's available data, including medical history, events of note, physical examination and test results as part of my evaluation. I have discussed with NP Karin Golden  and other care providers such as pharmacist, RN and RRT.     HPI:60 year old male past medical history of hypertension diabetes status post right BKA, coronary artery disease status post CABG, obesity, atrial flutter, history of MI, major depression, history of fibrillation, presents with unresponsiveness and cardiac arrest. Per ED notes and 69 physician patient had 25 minutes of ACLS protocol before ROSC.  Initial reading was asystole followed by PEA followed by wide complex tachycardia. Patient has a friend who he has given healthcare power of attorney to, Isla Pence [phone]).  After return of spontaneous circulation, ICU was called for further admission and management. Patient had a right IJ CVL placed, noted to have initial twitching of the right and left arms, followed by severely elevated blood pressure requiring nitroglycerin drip. Initially healthcare power of attorney said the patient was full code, but later on called back stating that after  reviewing paperwork in speaking with nursing staff at the nursing facility, patient made himself a DO NOT RESUSCITATE and would not want any advanced life saving therapies. Patient is currently on hypothermia protocol, 33C. I explained to the healthcare power of attorney that at this time without any paperwork or identification of healthcare power of attorney, patient will be full code. [facility] POA understood and asked that everything be done until he can find his paperwork.    A:  60 year old male status post cardiac arrest, asystole followed by PEA followed by Y complex tachycardia, now on hypothermia protocol.  Cardiac arrest, PEA arrest Acute respiratory failure Acute congestive heart failure Hypertension, malignant Unresponsive Comatose Diabetes Elevated troponin History of CHF History of PE   P:  -Patient started on hypothermia protocol at 30C -Right IJ central venous catheter line placed -Paralytic, sedation, analgesia -Mechanical ventilation,  continue and wean as tolerated once hypothermia protocol is completed or change in plan of care -May have been a possible hypoxic respiratory event leading to cardiac arrest since patient was complaining of shortness of breath while in the car prior to becoming unresponsive. -Continue with heparin drip, at this time we will treat NSTEMI, and consequently this will also treat any type of thrombotic event  .  Rest per NP/medical resident whose note is outlined above and that I agree with  The patient is critically ill with multiple organ systems failure and requires high complexity decision making for assessment and support, frequent evaluation and titration of therapies, application of advanced monitoring technologies and extensive interpretation of multiple databases.   Critical Care Time devoted to patient care services described in this note is  85 Minutes.   This time reflects time of care of this signee Dr Stephanie AcreVishal Kayman Snuffer.  This critical  care time does not reflect procedure time, or teaching time or supervisory time of PA/NP/Med-student/Med Resident etc but could involve care discussion time.  Stephanie AcreVishal Drayke Grabel, MD [facility] Pager [phone]- [phone] (please enter 7-digits) On Call Pager - [phone] (please enter 7-digits)  Note: This note was prepared with Dragon dictation along with smaller phrase technology. Any transcriptional errors that result from this process are unintentional.

## 2016-06-13 NOTE — Progress Notes (Signed)
Pt arrived from ED with new orders for a head CT. ED nurse and transporter went with pt to CT first instead of transferring to ICU bed.  After returning from CT, Dr Dema SeverinMungal was at bedside ready in insert central and arterial line.  Pt began cooling at 1831. Madelaine BhatStaci Colli, RN assisted with documentation, equipment, and pt setup for [employer]. Report given to Ihor DowBrittany Travis, RN.

## 2016-06-13 NOTE — ED Notes (Signed)
Propofol discontinued per [facility] by Bincy Varughese.

## 2016-06-13 NOTE — Code Documentation (Signed)
Pt pulled out of vehicle, pt not breathing, unresponsive, no pulse. CPR started and pt placed on stretcher.

## 2016-06-13 NOTE — Progress Notes (Signed)
ANTICOAGULATION CONSULT NOTE - Initial Consult  Pharmacy Consult for heparin drip Indication: chest pain/ACS/Cardiac Arrest  No Known Allergies  Patient Measurements: Height: 6' (182.9 cm) Weight: 220 lb (99.791 kg) IBW/kg (Calculated) : 77.6 Heparin Dosing Weight: 87kg  Vital Signs: BP: 101/49 mmHg (07/11 1705) Pulse Rate: 57 (07/11 1705)  Labs:  Recent Labs  06-28-2016 1559  HGB 14.0  HCT 44.2  PLT 128*  APTT 46*  LABPROT 15.8*  INR 1.24  CREATININE 1.50*  TROPONINI 0.06*    Estimated Creatinine Clearance: 63.3 mL/min (by C-G formula based on Cr of 1.5).   Medical History: Past Medical History  Diagnosis Date  . HTN (hypertension)   . Diabetic foot ulcers (HCC) 10/2011    bilateral plantar 1st MTP ulcers, with deep tissue infection right foot 10/2011   . Anxiety   . Headache(784.0)   . NSTEMI (non-ST elevated myocardial infarction) (HCC) 10/17/2011  . Diabetes mellitus   . Pulmonary emphysema (HCC)   . Obesity   . Paroxysmal atrial flutter (HCC) 10/15/2011  . CAD (coronary artery disease), autologous vein bypass graft     s/p CABG in 80's or 90's, multiple caths/PCI. Presented in June of 2014 with non-ST elevation myocardial infarction. Card[facility] showed severe three-vessel coronary artery disease (nondominant RCA )with patent LIMA to LAD, occluded SVG to OM1(new) and occluded SVG to PDA. There was significant ostial and distal left main stenosis. Attempted left main/LCX PCI  . MI (myocardial infarction) (HCC)   . Chronic systolic heart failure (HCC)     Ejection fraction 35%  . Broken femur (HCC)   . CHF (congestive heart failure) (HCC)   . Major depression (HCC) unk  . PVD (peripheral vascular disease) (HCC) unk  . Rhabdomyolysis   . Atrial fibrillation (HCC)   . Dementia   . Anginal pain (HCC)   . Encephalopathy   . Cellulitis     Assessment: 60 yo male brought to ED with cardiac arrest for unknown duration.  Cardiology has consulted pharmacy for heparin dosing for cardiac arrest.    Goal of Therapy:  Heparin level 0.3-0.7 units/ml Monitor platelets by anticoagulation protocol: Yes   Plan:  Give 4000 units bolus x 1 Start heparin infusion at 1000 units/hr Check anti-Xa level in 6 hours and daily while on heparin Continue to monitor H&H and platelets  Cher NakaiSheema Marieann Zipp, PharmD Clinical Pharmacist Oct 13, 2016 5:24 PM

## 2016-06-13 NOTE — Code Documentation (Addendum)
Pulse check, no pulse found. CPR continued.

## 2016-06-13 NOTE — Progress Notes (Addendum)
Update: Mr. [name], [phone], [facility], presented tonight with paperwork of legal guardianship along with identification. He stated that he knew patient very well, and the patient has made him legal guardian since October 2016.  She has no children or living siblings, or parents.  HCPOA stated that after reviewing all paperwork and speaking with healthcare staff at [facility], Mr. [name] stated that patient would not want any extraneous or further advanced life saving therapies in the event of a cardiac arrest or terminal condition. Healthcare power of attorney expressed gratitude for care that was currently provided, but stated that per patient wishes no further aggressive measures are warranted, and HCPOA made the patient withdrawal of care along with comfort care. Patient noted to have twitching, seizure-like activity of the upper extremities, elevated blood pressure with low heart rate in the setting of sedation, analgesia, and paralytics.  Per healthcare power of attorney wishes and with respect to the patient wishes, withdrawal of care/comfort care orders entered.  RN Witness - Brittney T.   [name], MD [place] [facility] Pager [phone] (please enter 7-digits) On Call Pager - [phone] (please enter 7-digits)

## 2016-06-13 NOTE — ED Notes (Signed)
Pt with swelling to left neck.

## 2016-06-13 NOTE — ED Notes (Signed)
See ED note

## 2016-06-13 NOTE — Progress Notes (Signed)
I accepted care of this pt at 1900 [place]. The central line had just been placed by Dr. Dema SeverinMungal. X-ray confirmed placement. An A-line was attempted, but without success. Pt was already being cooled per the [employer] Hypothermia protocol. Along with Meta HatchetStaci and Brooke, RN we started the pt on fentanyl, Versed, then Nimbex. A Heparin drip was also initiated. The pt had been on Levophed, but it was off at the time, and remained off. As the night progressed, the pts pressure started to increase. Bincy and Dr. Dema SeverinMungal notified. Nitro drip initiated. This RN was monitoring Sedation and paralyzed state based off the [place]. Pt seemed adequately sedated and paralyzed, and his HR remained in 70s. This RN received a call from the pts guardian between 2030 and 2100. Guardian stated pt was in fact a DNR and that pt would not aggressive care. Dr. Dema SeverinMungal notified. Guardian/ HCPOA arrived with proof of identification and made the decision to withdrawal care and make pt comfort care. All drips were stopped at 2220. Pt was extubated at 2225. This RN along with Dutch QuintHiral Patel, RN pronounced TOD @2236 . Each RN listened for a full minute and no heart sounds were audible. This RN was unaware that CDS had not been notified, so I contacted and gave all pt information. Guardian and friend have been notified of pts passing.

## 2016-06-13 NOTE — Discharge Summary (Signed)
Death Summary  Date of Admission - August 26, 2016 Date of Death - August 26, 2016 Time of Death - 22:7236  HPI: 60 year old male past medical history of hypertension, diverticulosis, diabetes, non-ST elevated myocardial infarction 2012, emphysema, obesity, proximal atrial flutter, coronary artery disease status post autologous vein bypass graft, chronic systolic heart failure ejection fraction 30%, peripheral vascular disease status post right BKA, presenting on July 2011 with cardiac arrest. Per ER notes :. Patient was coded for 25 minutes with 2 rounds of epinephrine, 2 g of magnesium, 1 g of calcium chloride with ROSC. He was intubated for airway protection and ventilation. The patient had pupillary response after ROSC, but no gag, no cough, no other purposeful movement. Initial EKG showing ST depressions diffusely were no ST elevations. Patient was started on propofol for sedation. Chest x-ray after ROSC showing appropriately placed ET tube. He was then seen by cardiology who noted that he had occluded vein grafts with only a patent LIMA-LAD. Given the above presentation it was unclear. Had an acute coronary syndrome versus a PE alternative up etiology. He was started on a heparin drip, initially noted to have a troponin of 0.06, and transferred to the ICU unit for hypothermic protocol.  Hospital summary: Patient was started on hypothermia protocol to a goal temperature of 33C, he had a right internal jugular central line placed for hemodynamic resuscitation of fluids, blood products, antibiotics, antiarrhythmics, patient not achieve goal temperature due to withdrawal of care by legal guardian.. Initially he was noted to have a low blood pressure in the ER was started briefly on levophed, however over the course of his admission his blood pressure steadily continued to rise even in the setting levophed discontinuation and a nitro drip. SBPs in the 200s. His health care power of attorney gave further history as  stated: Patient went to the movies and lunch with his friend, upon driving back to his nursing facility he complained of shortness of breath and started to become more unresponsive, health care power of attorney brought patient straight to the ER, left the car and got triage nurse back to the car, at which point he was noted to have no pulse and be unresponsive. Per healthcare power of attorney total time unsupervise was less than 5 minutes.  Patient had ACLS protocol performed as stated above. Health care power of attorney at that point made the patient a full code until he could locate the patient paperwork for instructions on further medical care given his complicated medical issues or overall clinical decline during the past year. Health care power of attorney return later on on July 11 and stated that patient made his wishes clear to  health nursing staff and to the healthcare power of attorney that in the event that he would have a significant cardiac event, he would not want prolonged aggressive life-saving measures, such as what was being performed. At that time healthcare power of attorney, in the presence of nursing staff, made the patient DO NOT RESUSCITATE and comfort care along with withdrawal of care; again with respect to the patient wishes that he expressed verbally. Patient was extubated and placed on withdrawal of care/comfort care protocol.  Problem list Cardiac arrest-asystole followed by PEA followed by Y complex tachycardia Hypoxic respiratory arrest Diabetes with complications Acute on chronic congestive heart failure Elevated troponin Comatose History of pulmonary embolism History of coronary bypass History of occluded bypass graft  Date of death: 04-01-16 Time of death: 22:36  Stephanie AcreVishal Annalaura Sauseda, MD [place] Pager [phone] (please enter 7-digits) On Call Pager - [phone] (please enter 7-digits)

## 2016-06-13 NOTE — ED Notes (Signed)
Pulse check. Pt HR 138, strong femoral and carotid pulse.

## 2016-06-13 DEATH — deceased

## 2016-08-01 ENCOUNTER — Ambulatory Visit: Payer: Medicare Other | Admitting: Cardiovascular Disease

## 2016-08-30 IMAGING — CR DG CHEST 1V PORT
1 series · 1 of 1 positions shown · non-contrast
Comparison: 02/14/2015

CLINICAL DATA: Chest pain and shortness of breath since this
afternoon. No cough.

EXAM:
PORTABLE CHEST - 1 VIEW

[ap]
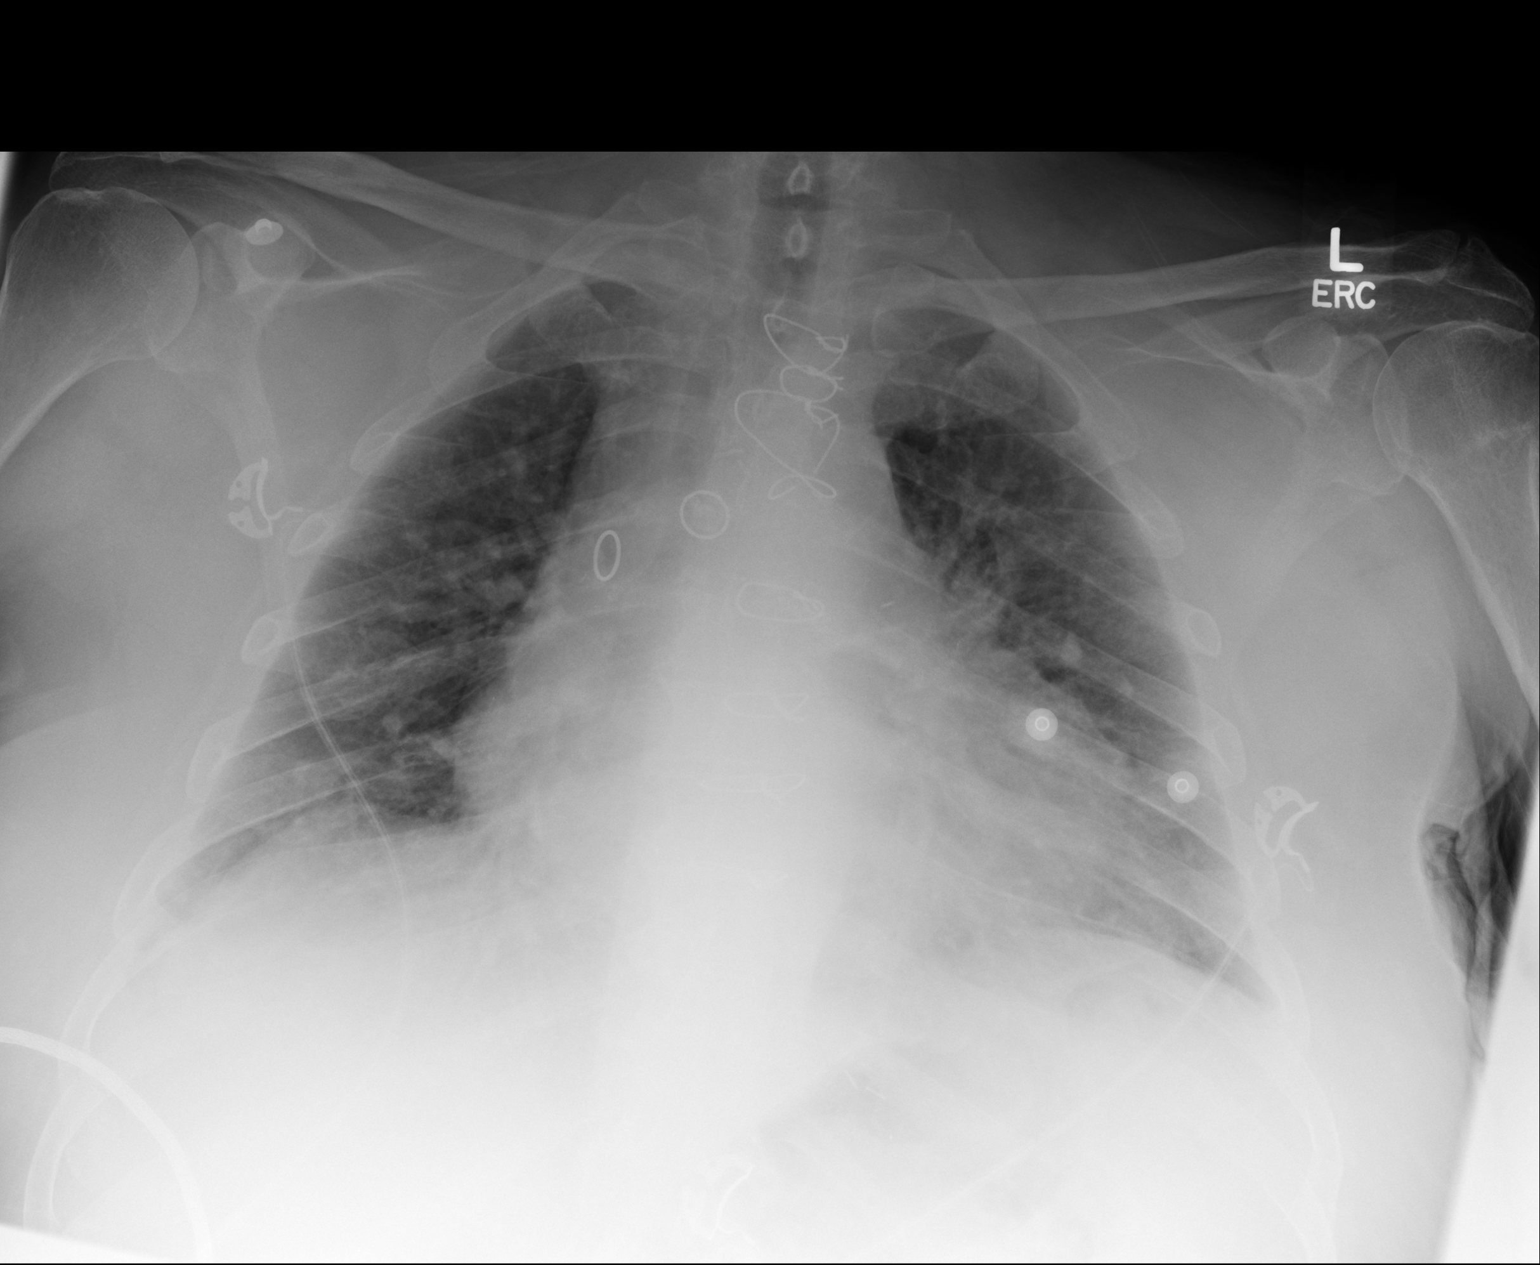

[1 of 1 positions shown; findings below may reference images not displayed]

FINDINGS: Postoperative changes in the mediastinum. Shallow inspiration.
Cardiac enlargement with mild prominence of pulmonary vascular
shadow suggesting mild vascular congestion. No focal airspace
disease or consolidation. No blunting of costophrenic angles. No
pneumothorax. Calcified and tortuous aorta.
IMPRESSION: Cardiac enlargement with suggestion of mild developing pulmonary
vascular congestion

## 2017-09-15 IMAGING — MR MR FOOT*L* WO/W CM
9 series · 37 of 40 positions shown · IV contrast (18 ML MULTIHANCE)
Comparison: 11/08/2011

CLINICAL DATA: Nonhealing wound along the ball of the foot for the
past 2 months.

EXAM:
MRI OF THE LEFT FOREFOOT WITHOUT AND WITH CONTRAST
TECHNIQUE: Multiplanar, multisequence MR imaging was performed both before and
after administration of intravenous contrast.
CONTRAST:  18 cc MultiHance

[Series 3: T1 · coronal · 3.0mm · 0.70mm/px · 5 of 39 slices shown (1 of 2)]
[im 1/39]
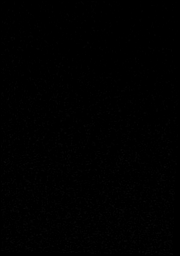
[im 10/39]
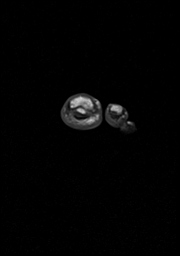
[im 20/39]
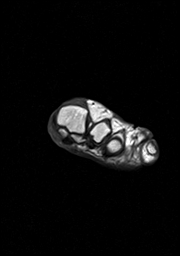
[im 29/39]
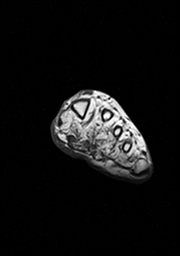
[im 39/39]
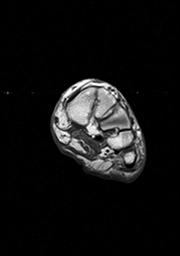

[Series 4: axial t1fs · coronal · 3.0mm · 0.70mm/px · 5 of 40 slices shown]
[im 1/40]
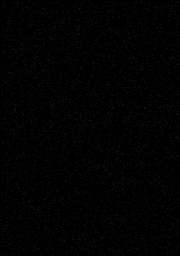
[im 10/40]
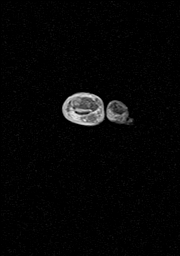
[im 20/40]
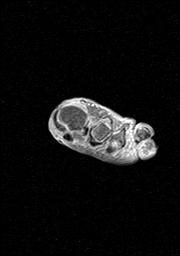
[im 30/40]
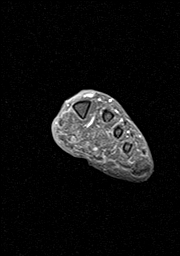
[im 40/40]
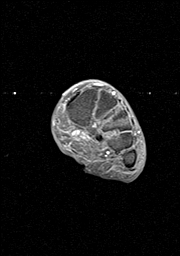

[Series 5: T2 fat-sat · coronal · 3.0mm · 0.35mm/px · 5 of 40 slices shown]
[im 1/40]
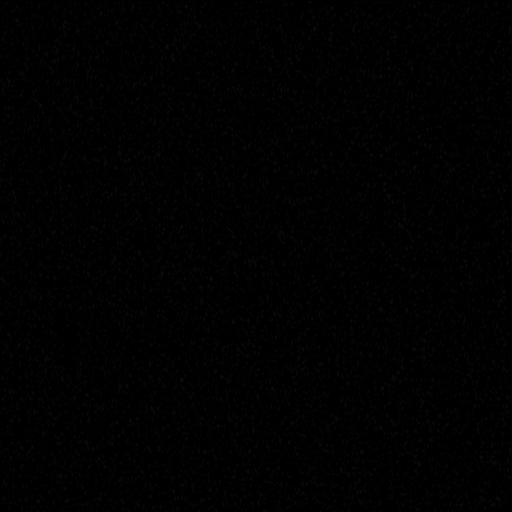
[im 10/40]
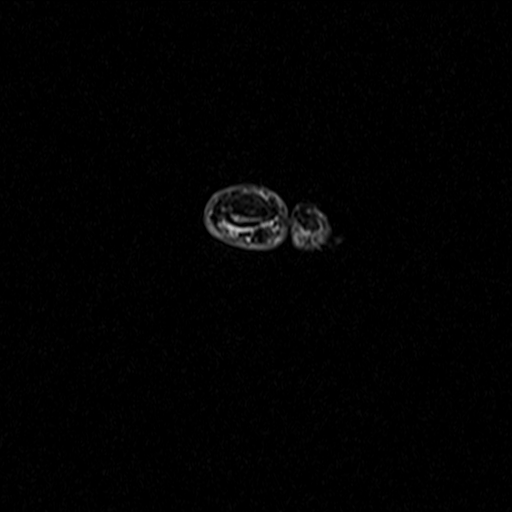
[im 20/40]
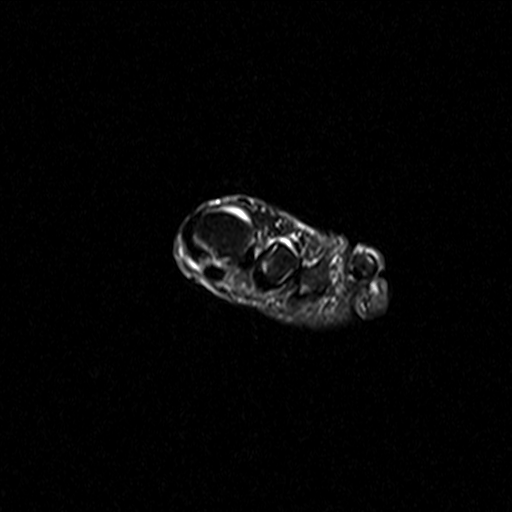
[im 30/40]
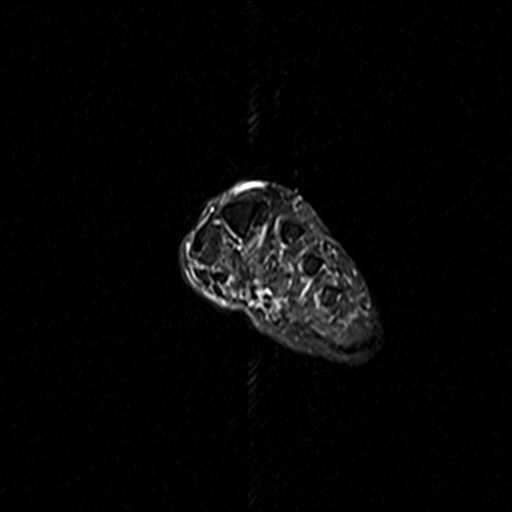
[im 40/40]
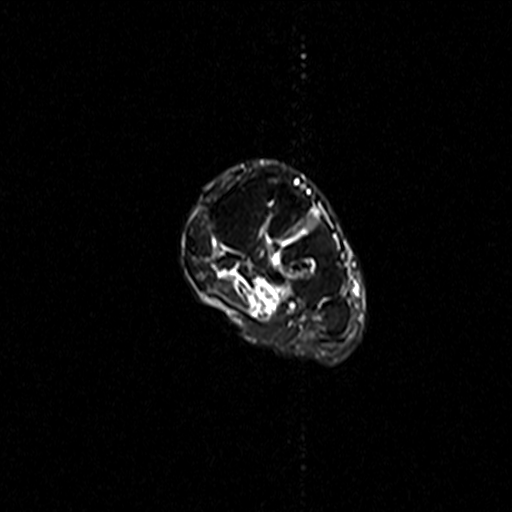

[Series 6: T1 · axial · 3.0mm · 0.62mm/px · z∈[-61,+11]mm · 4 of 26 slices shown (2 of 2)]
[im 1/26]
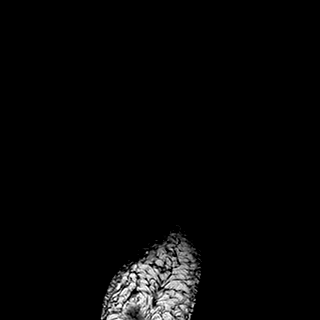
[im 9/26]
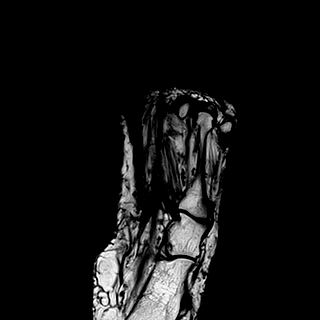
[im 17/26]
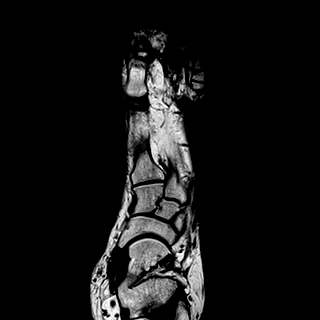
[im 26/26]
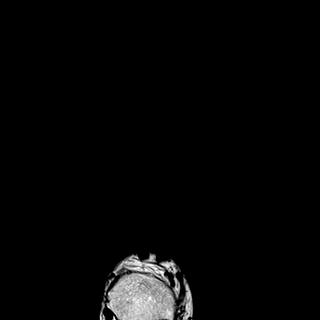

[Series 7: STIR · axial · 3.0mm · 0.39mm/px · z∈[-61,+11]mm · 4 of 26 slices shown (1 of 2)]
[im 1/26]
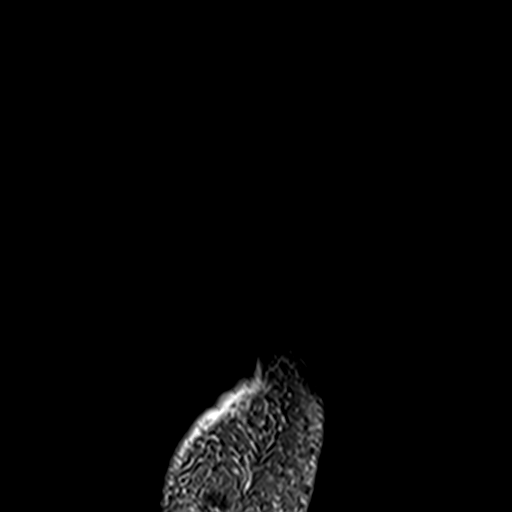
[im 9/26]
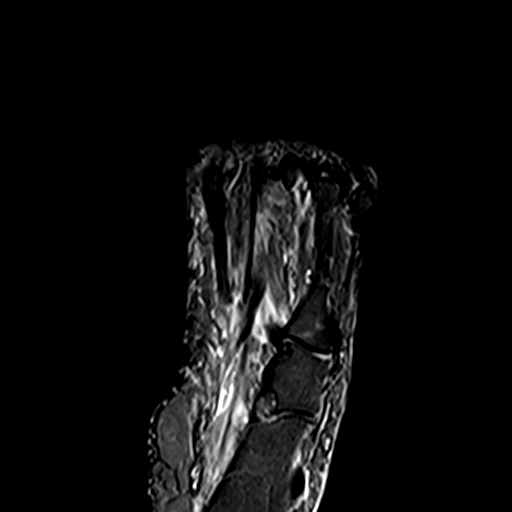
[im 17/26]
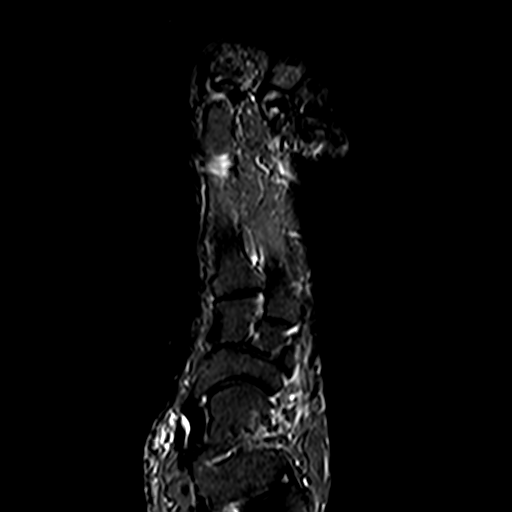
[im 26/26]
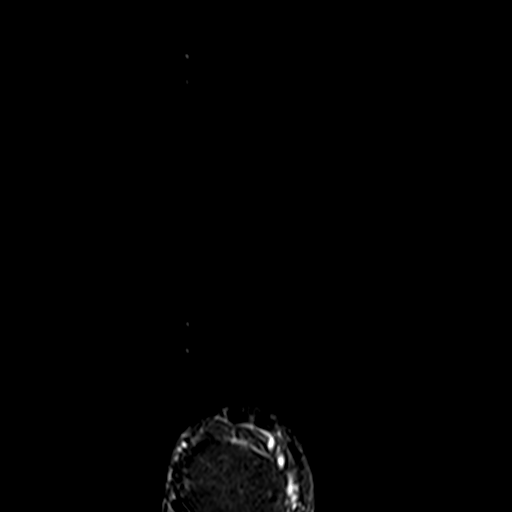

[Series 8: STIR · sagittal · 3.0mm · 0.39mm/px · 1 of 28 slices shown (2 of 2)]
[im 1/28]
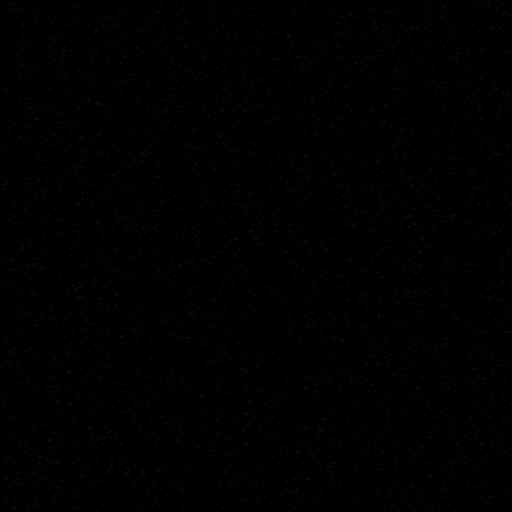

[Series 9: T1 fat-sat · coronal · 3.0mm · 0.70mm/px · 5 of 40 slices shown (1 of 3)]
[im 1/40]
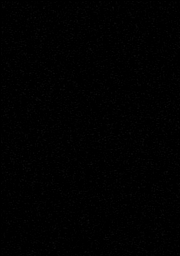
[im 10/40]
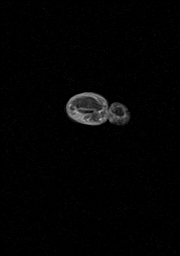
[im 20/40]
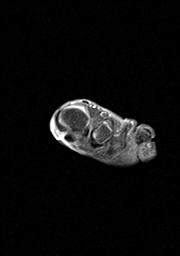
[im 30/40]
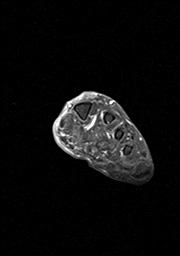
[im 40/40]
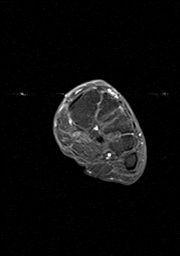

[Series 10: T1 fat-sat · axial · 3.0mm · 0.62mm/px · z∈[-61,+11]mm · 4 of 26 slices shown (2 of 3)]
[im 1/26]
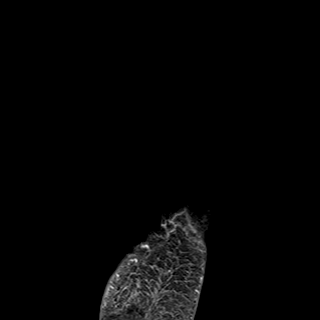
[im 9/26]
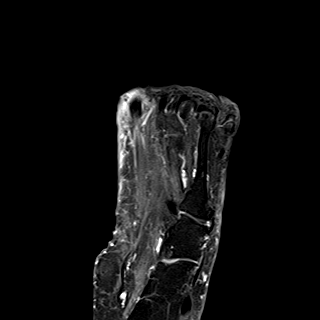
[im 17/26]
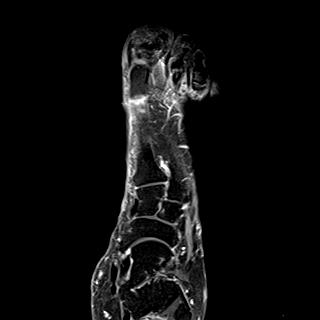
[im 26/26]
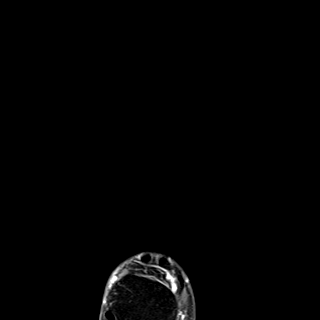

[Series 11: T1 fat-sat · sagittal · 3.0mm · 0.78mm/px · 4 of 28 slices shown (3 of 3)]
[im 1/28]
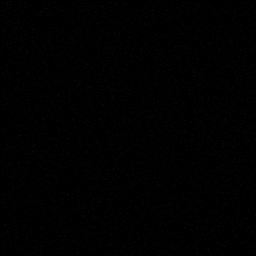
[im 10/28]
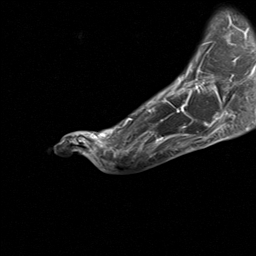
[im 19/28]
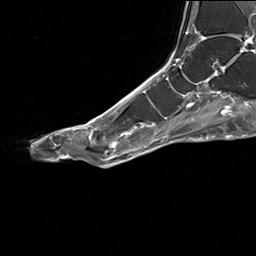
[im 28/28]
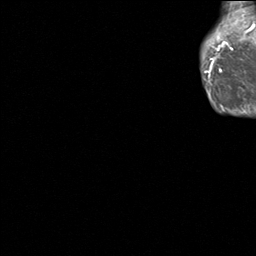

[37 of 40 positions shown; findings below may reference images not displayed]

FINDINGS: Osteomyelitis protocol MRI of the foot was obtained, to include the
entire foot and ankle. This protocol uses a large field of view to
cover the entire foot and ankle, and is suitable for assessing bony
structures for osteomyelitis. Due to the large field of view and
imaging plane choice, this protocol is less sensitive for assessing
small structures such as ligamentous structures of the foot and
ankle, compared to a dedicated forefoot or dedicated hindfoot exam.

Edema tracks along the plantar musculature of the foot without
significant associated enhancement.

There is edema in both sesamoid bones of the first digit. There is
shown for example on image 19 series 5. Low-level enhancement in
both bones is also noted. Underlying subcutaneous edema and
enhancement along the plantar ball of the foot medially noted.

Based on the relatively normal appearance on T1 weighted images, I
suspect that the increased T2 signal in questionable enhancement in
the fused distal and middle phalanges of the small toe is probably
from field heterogeneity rather than osteomyelitis.

Skin thickening along the medial ball of the foot. Upper normal
amount of fluid in the first MTP joint.

The Lisfranc ligament is intact.
IMPRESSION: 1. There is abnormal edema in both first digit sesamoid bones with
low-level enhancement. Appearance compatible with sesamoiditis and
there is adjacent subcutaneous edema and enhancement along with
suspected small ulcer in this vicinity, and accordingly this
sesamoiditis could be due to infection/osteomyelitis of the
sesamoids.
2. Low-level edema in the plantar musculature of the foot, low-level
edema tracking along the plantar musculature of the foot, without
significant enhancement.
3. I suspect that the accentuated T2 signal in the small toe is due
to field heterogeneity rather than osteomyelitis, based on the
relatively normal appearance on the T1 weighted images.
